# Patient Record
Sex: Male | Born: 1935 | Race: White | Hispanic: No | Marital: Married | Smoking: Never smoker
Health system: Southern US, Community
[De-identification: ages and names within clinical notes are randomized; demographics above are authoritative.]

## PROBLEM LIST (undated history)

## (undated) DIAGNOSIS — I639 Cerebral infarction, unspecified: Secondary | ICD-10-CM

## (undated) DIAGNOSIS — I2510 Atherosclerotic heart disease of native coronary artery without angina pectoris: Secondary | ICD-10-CM

## (undated) DIAGNOSIS — S4990XA Unspecified injury of shoulder and upper arm, unspecified arm, initial encounter: Secondary | ICD-10-CM

## (undated) DIAGNOSIS — E785 Hyperlipidemia, unspecified: Secondary | ICD-10-CM

## (undated) HISTORY — PX: ANGIOPLASTY: SHX39

## (undated) HISTORY — DX: Cerebral infarction, unspecified: I63.9

## (undated) HISTORY — DX: Atherosclerotic heart disease of native coronary artery without angina pectoris: I25.10

## (undated) HISTORY — PX: CARDIAC CATHETERIZATION: SHX172

## (undated) HISTORY — DX: Hyperlipidemia, unspecified: E78.5

## (undated) HISTORY — DX: Unspecified injury of shoulder and upper arm, unspecified arm, initial encounter: S49.90XA

---

## 1998-07-29 ENCOUNTER — Ambulatory Visit (HOSPITAL_BASED_OUTPATIENT_CLINIC_OR_DEPARTMENT_OTHER): Admission: RE | Admit: 1998-07-29 | Discharge: 1998-07-29 | Payer: Self-pay | Admitting: *Deleted

## 2001-11-21 ENCOUNTER — Encounter: Admission: RE | Admit: 2001-11-21 | Discharge: 2001-11-21 | Payer: Self-pay | Admitting: Family Medicine

## 2001-11-21 ENCOUNTER — Encounter: Payer: Self-pay | Admitting: Family Medicine

## 2007-09-02 IMAGING — CR DG CHEST 2V
2 series · 2 of 2 positions shown · non-contrast
Comparison: None.

CLINICAL DATA: Preop evaluation for eye surgery.

[view not recorded (1 of 2)]
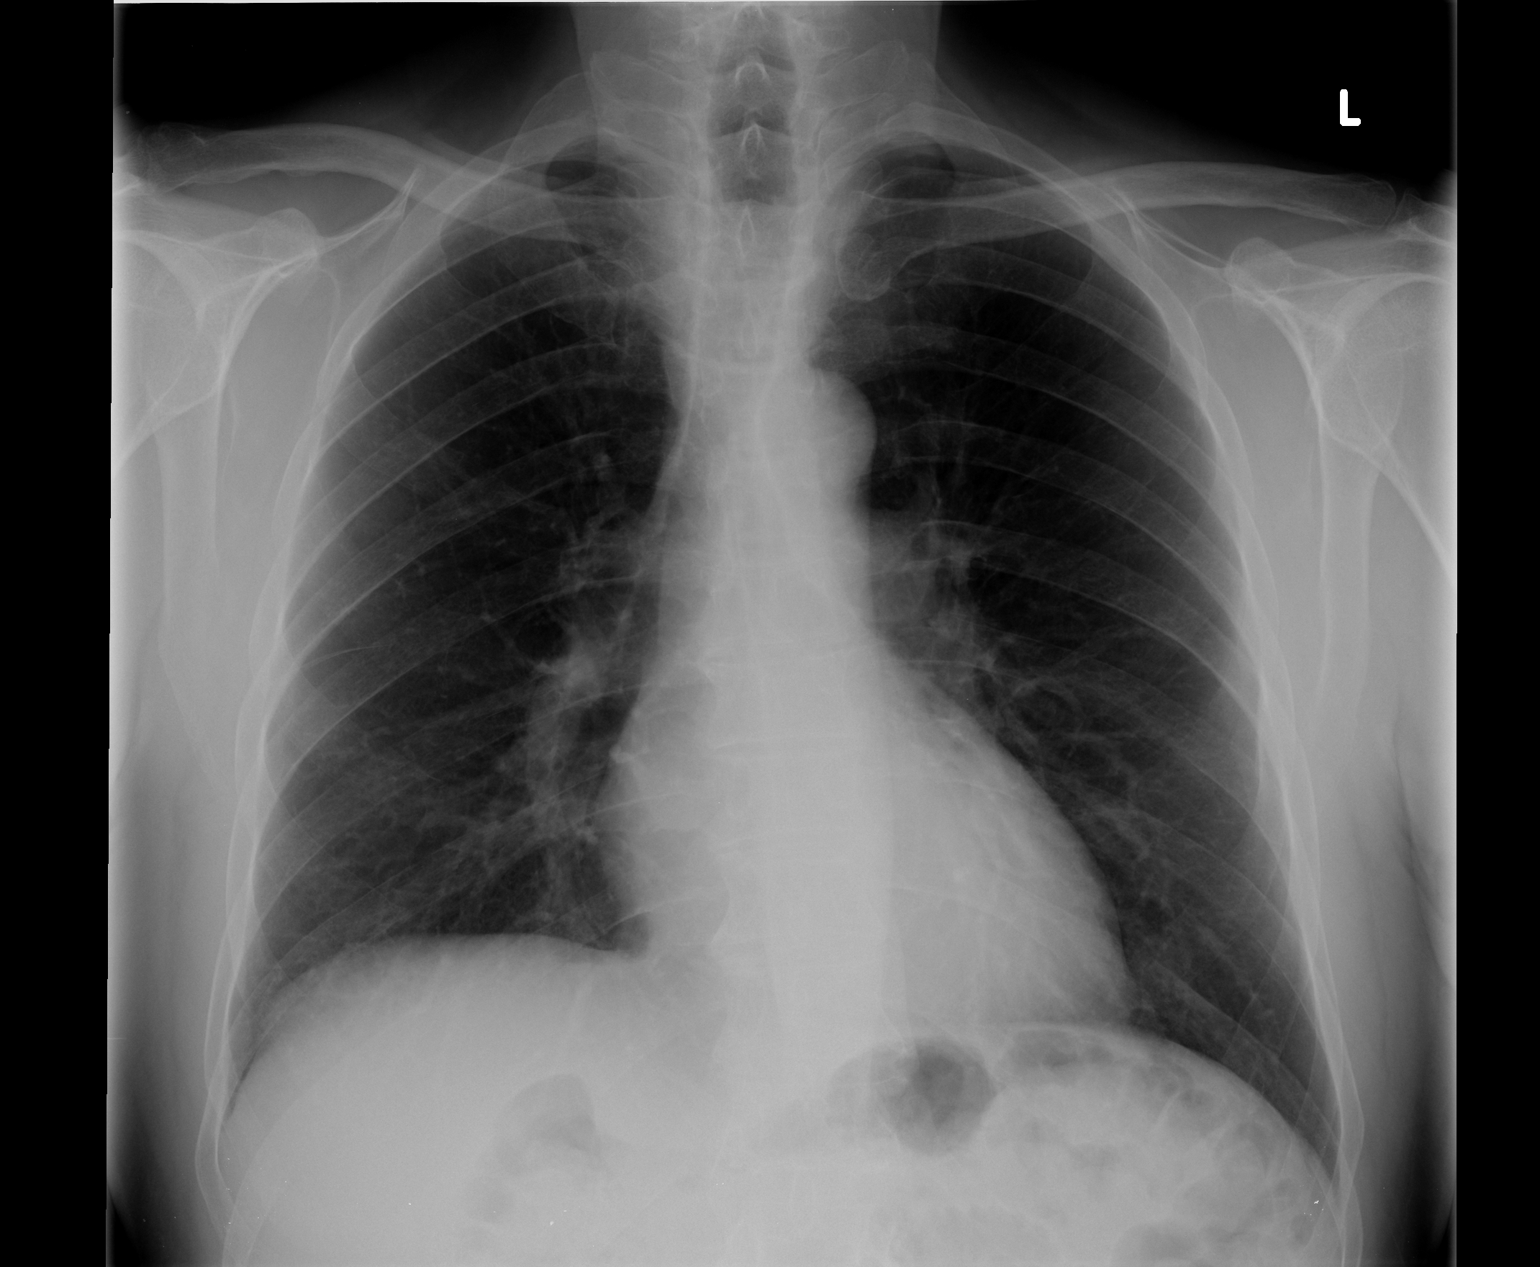

[view not recorded (2 of 2)]
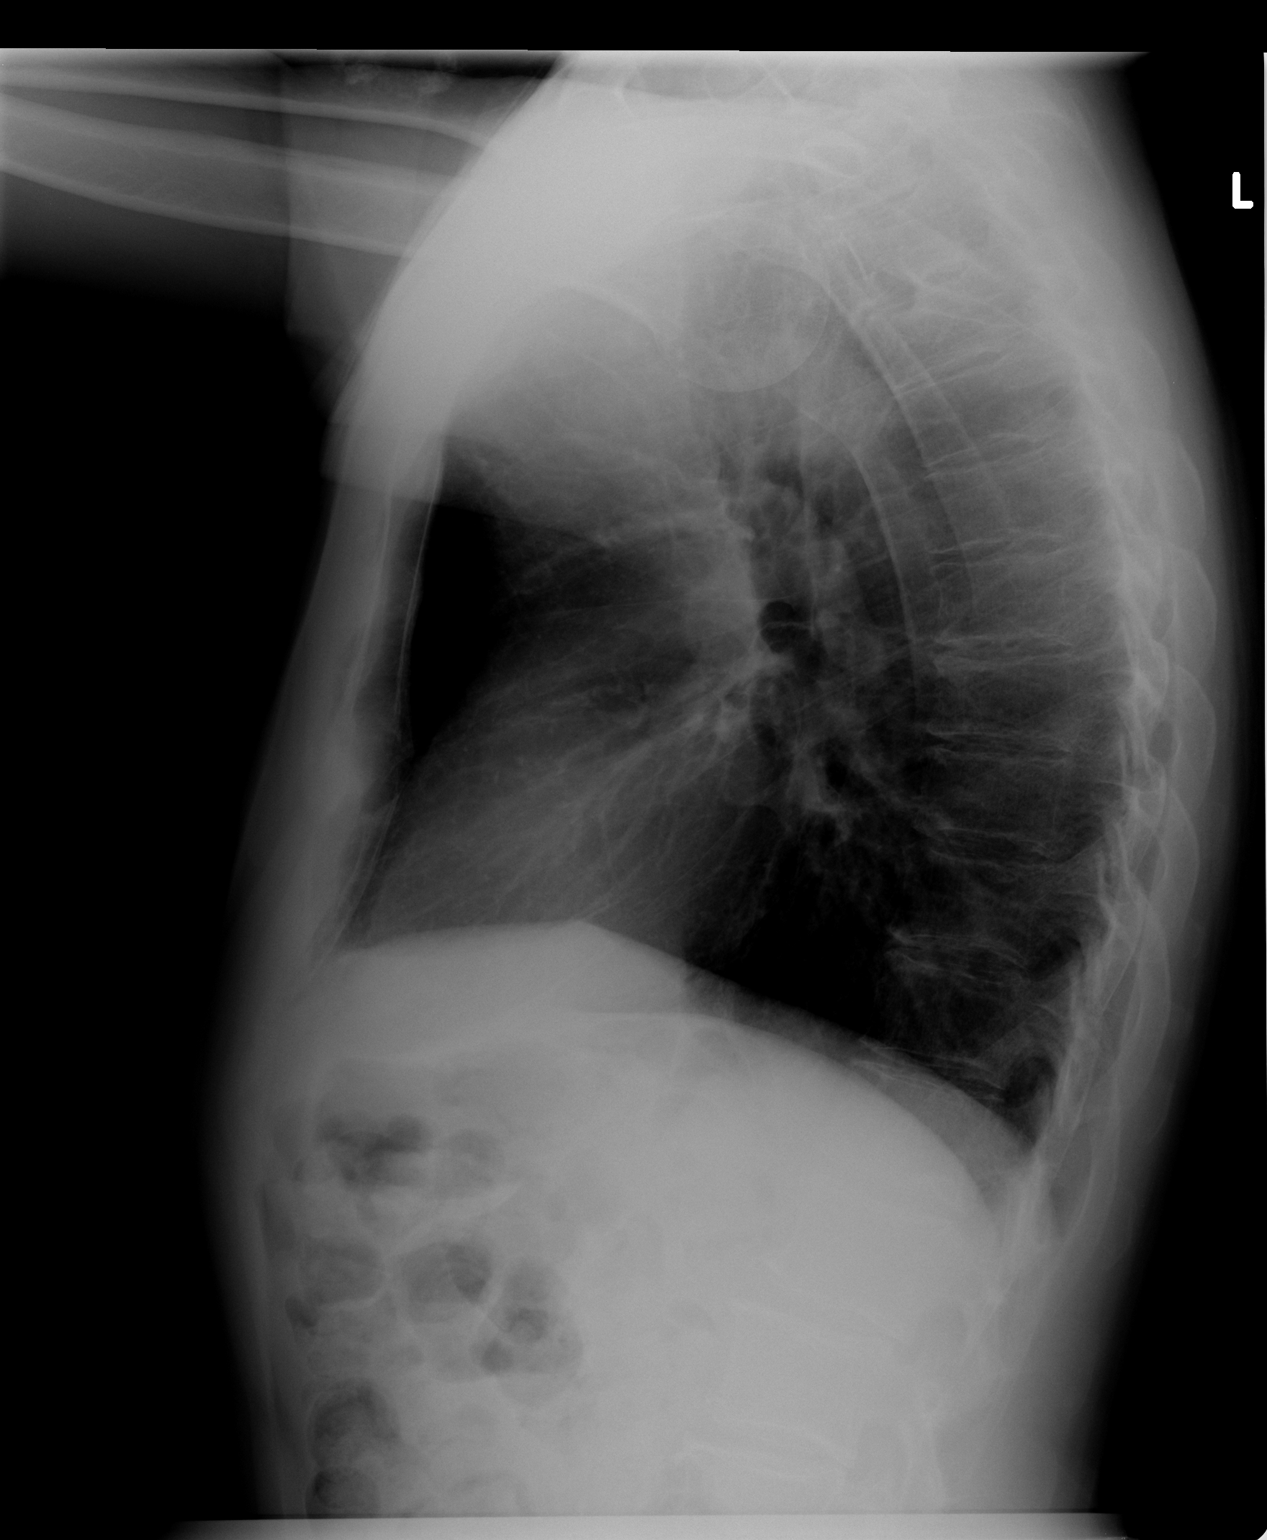

[2 of 2 positions shown; findings below may reference images not displayed]

CHEST - 2 VIEW:

Lungs are clear. Cardiopericardial silhouette is within normal limits for size.
Mild pectus excavatum deformity is evident. Bony structures are otherwise
unremarkable.
IMPRESSION: No acute cardiopulmonary process

## 2007-09-03 ENCOUNTER — Ambulatory Visit (HOSPITAL_COMMUNITY): Admission: RE | Admit: 2007-09-03 | Discharge: 2007-09-03 | Payer: Self-pay | Admitting: Ophthalmology

## 2010-04-26 ENCOUNTER — Ambulatory Visit: Payer: Self-pay | Admitting: Cardiology

## 2011-01-02 NOTE — Op Note (Signed)
NAME:  Clarence Myers, Clarence Myers             ACCOUNT NO.:  [id number]   MEDICAL RECORD NO.:  [id number]          PATIENT TYPE:  AMB   LOCATION:  SDS                          FACILITY:  [facility]   PHYSICIAN:  Lanna Poche, M.D. DATE OF BIRTH:  11-04-1933   DATE OF PROCEDURE:  09/03/2007  DATE OF DISCHARGE:                               OPERATIVE REPORT   PREOPERATIVE DIAGNOSIS:  Macular pucker left eye.   POSTOPERATIVE DIAGNOSIS:  Macular pucker left eye.   PROCEDURE:  Pars plana vitrectomy, membrane peeling, and air/fluid  exchange left eye.   SURGEON:  Lanna Poche, M.D.   ASSISTANT:  Ulysees Barns, LPN   ANESTHESIA:  General endotracheal.   ESTIMATED BLOOD LOSS:  Less than 1 mL.   COMPLICATIONS:  None.   DESCRIPTION OF PROCEDURE:  The patient was taken to the operating room  where after induction of general anesthesia, the left eye was prepped  and draped in the usual sterile fashion.  The lid speculum was  introduced and conjunctival peritomies were created temporally and  superonasally.  Hemostasis was obtained with the eraser cautery and  sclerotomies were fashioned 3.5 mm posterior to the limbus at 1:30,  10:30, and 4:30.  The superior sclerotomies were plugged and a 4 mm  infusion cannula was secured to the 4:30 sclerotomy with a temporary  suture of 7-0 Vicryl.  This was visually inspected and found to be in  good position.  The Landers ring was secured with 7-0 Vicryl sutures at  3 and 9.  Plugs were removed and a 30 degree prismatic lens was applied  to the surface of the eye.  A central core followed by peripheral  vitrectomy was performed.  There was posterior hyaloid separation.  A  pneumatic flat lens was applied to the surface of the eye.  The silicon  tipped catheter was used to check for the presence of cortical vitreous  over the macula and none was found to be present.  The MVR blade was  used to make an incision to the glistening epiretinal membrane.  The  edge of the membrane was then elevated with the rice pick, peeled off  the macular area.  There were two focal areas of adhesions encountered,  one superior and slightly temporal.  There was a small punctate retinal  hemorrhage where the membrane had been adherent.  When sweeping off  inferotemporally and no area of adherence was encountered.  When  sweeping the membrane, a small defect of the retina was seen.  The  membrane was then peeled around this area and the rest of the membrane  removed en block.  The scissors were used to trim the membrane around a  little focal area of adhesion that had been adherent.  No additional  membrane was seen and no glistening to the retinal surface was  appreciated.  No internal limiting membrane peel was undertaken.  The  instruments were removed from the eye and the holes were plugged.  The  Landers lens ring removed.  Inspection with the indirect ophthalmoscope  revealed there to be no retinal breaks or  tears.  The silicon tip  catheter was then used for air/fluid exchange.  The superior  sclerotomies were then closed with 7-0 Vicryl.  The infusion cannula was  removed and the preplaced sutures secured.  The conjunctiva was drawn up  and reapproximated with interrupted and running sutures of 6-0 plain  gut.  The pressure was checked with the Barraquer tonometer and found to  be approximately 18.  The subconjunctival space was irrigated with 0.75%  Marcaine followed by subconjunctival injections of 100 mg of  ceftazidime, 10 mg of Decadron.  The lid speculum  was then removed and mixed antibiotic ointment applied to the surface of  the eye.  Eye patch and shield was then placed over the patient's left  eye.  Upon awaking from anesthesia, the patient left the operating room  in stable condition.           ______________________________  Lanna Poche, M.D.     JTH/MEDQ  D:  09/03/2007  T:  09/03/2007  Job:  [id number]

## 2011-03-22 ENCOUNTER — Telehealth: Payer: Self-pay | Admitting: Cardiology

## 2011-03-22 NOTE — Telephone Encounter (Signed)
Pt called to schedule yealy OV, notes states pt need ETT with labs, please call pt to schedule

## 2011-04-05 ENCOUNTER — Telehealth: Payer: Self-pay | Admitting: *Deleted

## 2011-04-05 ENCOUNTER — Other Ambulatory Visit: Payer: Self-pay | Admitting: *Deleted

## 2011-04-05 DIAGNOSIS — I2510 Atherosclerotic heart disease of native coronary artery without angina pectoris: Secondary | ICD-10-CM

## 2011-04-05 NOTE — Telephone Encounter (Signed)
Have left several messages for pt to return call. Will need to schedule him at [facility] office  When calls back will need to decide if needs ETT or do stress test. Also will need fasting labs

## 2011-05-01 ENCOUNTER — Encounter: Payer: Self-pay | Admitting: Cardiology

## 2011-05-03 ENCOUNTER — Other Ambulatory Visit: Payer: Self-pay | Admitting: *Deleted

## 2011-05-10 LAB — CBC
HCT: 41.5
Hemoglobin: 14.1
MCHC: 34
MCV: 92.2
RBC: 4.5
RDW: 12.2

## 2011-05-10 LAB — BASIC METABOLIC PANEL
CO2: 26
Chloride: 105
GFR calc Af Amer: >60
Glucose, Bld: 82
Potassium: 3.8
Sodium: 132 — ABNORMAL LOW

## 2011-05-10 LAB — URINALYSIS, ROUTINE W REFLEX MICROSCOPIC
Bilirubin Urine: NEGATIVE
Glucose, UA: NEGATIVE
Hgb urine dipstick: NEGATIVE
Protein, ur: NEGATIVE
Urobilinogen, UA: 0.2

## 2011-05-11 ENCOUNTER — Other Ambulatory Visit (INDEPENDENT_AMBULATORY_CARE_PROVIDER_SITE_OTHER): Payer: Medicare Other | Admitting: *Deleted

## 2011-05-11 ENCOUNTER — Encounter: Payer: Self-pay | Admitting: Cardiology

## 2011-05-11 ENCOUNTER — Ambulatory Visit (INDEPENDENT_AMBULATORY_CARE_PROVIDER_SITE_OTHER): Payer: Medicare Other | Admitting: Cardiology

## 2011-05-11 VITALS — Ht 66.0 in | Wt 134.0 lb

## 2011-05-11 DIAGNOSIS — I2510 Atherosclerotic heart disease of native coronary artery without angina pectoris: Secondary | ICD-10-CM | POA: Insufficient documentation

## 2011-05-11 DIAGNOSIS — E785 Hyperlipidemia, unspecified: Secondary | ICD-10-CM | POA: Insufficient documentation

## 2011-05-11 LAB — LIPID PANEL
Cholesterol: 152 mg/dL (ref 0–200)
HDL: 57 mg/dL (ref >39.00)
LDL Cholesterol: 85 mg/dL (ref 0–99)
Total CHOL/HDL Ratio: 3
Triglycerides: 48 mg/dL (ref 0.0–149.0)
VLDL: 9.6 mg/dL (ref 0.0–40.0)

## 2011-05-11 LAB — HEPATIC FUNCTION PANEL
ALT: 19 U/L (ref 0–53)
AST: 31 U/L (ref 0–37)
Albumin: 4.3 g/dL (ref 3.5–5.2)
Alkaline Phosphatase: 73 U/L (ref 39–117)
Bilirubin, Direct: 0.2 mg/dL (ref 0.0–0.3)
Total Bilirubin: 0.8 mg/dL (ref 0.3–1.2)
Total Protein: 7.5 g/dL (ref 6.0–8.3)

## 2011-05-11 LAB — BASIC METABOLIC PANEL
BUN: 21 mg/dL (ref 6–23)
CO2: 26 mEq/L (ref 19–32)
Calcium: 9.2 mg/dL (ref 8.4–10.5)
Chloride: 103 mEq/L (ref 96–112)
Creatinine, Ser: 1 mg/dL (ref 0.4–1.5)
GFR: 75.59 mL/min (ref >60.00)
Glucose, Bld: 103 mg/dL — ABNORMAL HIGH (ref 70–99)
Potassium: 5.1 mEq/L (ref 3.5–5.1)
Sodium: 142 mEq/L (ref 135–145)

## 2011-05-11 NOTE — Assessment & Plan Note (Signed)
Stress test stable from 1 year prior. Follow up in 1 year.

## 2011-05-11 NOTE — Progress Notes (Signed)
Patient seen for yearly stress test. See test report for details.

## 2011-05-12 ENCOUNTER — Encounter: Payer: Self-pay | Admitting: Cardiology

## 2011-05-14 ENCOUNTER — Telehealth: Payer: Self-pay | Admitting: *Deleted

## 2011-05-14 NOTE — Telephone Encounter (Signed)
Lm w/lab results. To call back if has any questions. Will send to Dr. Pincus Badder.

## 2011-05-14 NOTE — Telephone Encounter (Signed)
Message copied by Lorayne Bender on Mon May 14, 2011  4:58 PM ------      Message from: [place], PETER M      Created: Fri May 11, 2011  5:15 PM       Lipids and chemistries are good. Glucose is 103.      Theron Arista [place]

## 2011-09-04 DIAGNOSIS — D485 Neoplasm of uncertain behavior of skin: Secondary | ICD-10-CM | POA: Diagnosis not present

## 2011-10-09 ENCOUNTER — Encounter: Payer: Self-pay | Admitting: Cardiology

## 2011-10-09 DIAGNOSIS — Z0000 Encounter for general adult medical examination without abnormal findings: Secondary | ICD-10-CM | POA: Diagnosis not present

## 2011-10-09 DIAGNOSIS — Z1211 Encounter for screening for malignant neoplasm of colon: Secondary | ICD-10-CM | POA: Diagnosis not present

## 2011-10-09 DIAGNOSIS — I10 Essential (primary) hypertension: Secondary | ICD-10-CM | POA: Diagnosis not present

## 2011-10-09 DIAGNOSIS — Z125 Encounter for screening for malignant neoplasm of prostate: Secondary | ICD-10-CM | POA: Diagnosis not present

## 2011-10-09 DIAGNOSIS — N529 Male erectile dysfunction, unspecified: Secondary | ICD-10-CM | POA: Diagnosis not present

## 2011-10-09 DIAGNOSIS — I2510 Atherosclerotic heart disease of native coronary artery without angina pectoris: Secondary | ICD-10-CM | POA: Diagnosis not present

## 2011-10-09 DIAGNOSIS — R7301 Impaired fasting glucose: Secondary | ICD-10-CM | POA: Diagnosis not present

## 2011-10-09 DIAGNOSIS — E782 Mixed hyperlipidemia: Secondary | ICD-10-CM | POA: Diagnosis not present

## 2011-10-30 DIAGNOSIS — D126 Benign neoplasm of colon, unspecified: Secondary | ICD-10-CM | POA: Diagnosis not present

## 2011-10-30 DIAGNOSIS — Z1211 Encounter for screening for malignant neoplasm of colon: Secondary | ICD-10-CM | POA: Diagnosis not present

## 2012-01-30 DIAGNOSIS — H4011X0 Primary open-angle glaucoma, stage unspecified: Secondary | ICD-10-CM | POA: Diagnosis not present

## 2012-06-19 DIAGNOSIS — H52229 Regular astigmatism, unspecified eye: Secondary | ICD-10-CM | POA: Diagnosis not present

## 2012-06-19 DIAGNOSIS — H4011X0 Primary open-angle glaucoma, stage unspecified: Secondary | ICD-10-CM | POA: Diagnosis not present

## 2012-06-19 DIAGNOSIS — H5210 Myopia, unspecified eye: Secondary | ICD-10-CM | POA: Diagnosis not present

## 2012-10-23 ENCOUNTER — Telehealth: Payer: Self-pay | Admitting: Cardiology

## 2012-10-23 DIAGNOSIS — I2510 Atherosclerotic heart disease of native coronary artery without angina pectoris: Secondary | ICD-10-CM

## 2012-10-23 NOTE — Telephone Encounter (Signed)
New problem   Pt was wondering is he suppose to have an exercise test and if so he need someone to call him to schedule.

## 2012-10-28 NOTE — Telephone Encounter (Signed)
Patient called phone rings busy. 

## 2012-10-29 NOTE — Telephone Encounter (Signed)
Spoke to patient he stated his phone has been out of order.States every year he has a treadmill test.Patient was told Dr.Jordan out of office this week, will check with him and call him back.

## 2012-10-30 DIAGNOSIS — K219 Gastro-esophageal reflux disease without esophagitis: Secondary | ICD-10-CM | POA: Diagnosis not present

## 2012-10-30 DIAGNOSIS — Z125 Encounter for screening for malignant neoplasm of prostate: Secondary | ICD-10-CM | POA: Diagnosis not present

## 2012-10-30 DIAGNOSIS — I10 Essential (primary) hypertension: Secondary | ICD-10-CM | POA: Diagnosis not present

## 2012-10-30 DIAGNOSIS — I2510 Atherosclerotic heart disease of native coronary artery without angina pectoris: Secondary | ICD-10-CM | POA: Diagnosis not present

## 2012-10-30 DIAGNOSIS — E782 Mixed hyperlipidemia: Secondary | ICD-10-CM | POA: Diagnosis not present

## 2012-10-30 DIAGNOSIS — Z0000 Encounter for general adult medical examination without abnormal findings: Secondary | ICD-10-CM | POA: Diagnosis not present

## 2012-10-30 DIAGNOSIS — Z1211 Encounter for screening for malignant neoplasm of colon: Secondary | ICD-10-CM | POA: Diagnosis not present

## 2012-10-30 DIAGNOSIS — R7301 Impaired fasting glucose: Secondary | ICD-10-CM | POA: Diagnosis not present

## 2012-10-30 DIAGNOSIS — Z1331 Encounter for screening for depression: Secondary | ICD-10-CM | POA: Diagnosis not present

## 2012-10-30 DIAGNOSIS — N529 Male erectile dysfunction, unspecified: Secondary | ICD-10-CM | POA: Diagnosis not present

## 2012-11-07 NOTE — Telephone Encounter (Signed)
Spoke with patient he stated he has appointment scheduled 11/21/12 for treadmill with Tereso Newcomer PA.He stated he would like to schedule with Dr.Jordan so he don't have to make office visit with Dr.Jordan.Will check with Dr.Jordan and call him back.

## 2012-11-12 NOTE — Telephone Encounter (Signed)
Patient called no answer.LMTC. 

## 2012-11-13 DIAGNOSIS — H4011X0 Primary open-angle glaucoma, stage unspecified: Secondary | ICD-10-CM | POA: Diagnosis not present

## 2012-11-13 DIAGNOSIS — H409 Unspecified glaucoma: Secondary | ICD-10-CM | POA: Diagnosis not present

## 2012-11-13 NOTE — Telephone Encounter (Signed)
Patient called no answer.Left message on personal voice mail schedulers will be calling to schedule treadmill with Dr.Jordan.Treadmill with Tereso Newcomer PA will be cancelled.

## 2012-11-18 ENCOUNTER — Encounter: Payer: Self-pay | Admitting: Cardiology

## 2012-11-21 ENCOUNTER — Encounter: Payer: Medicare Other | Admitting: Physician Assistant

## 2013-03-11 DIAGNOSIS — M67919 Unspecified disorder of synovium and tendon, unspecified shoulder: Secondary | ICD-10-CM | POA: Diagnosis not present

## 2013-04-06 DIAGNOSIS — L570 Actinic keratosis: Secondary | ICD-10-CM | POA: Diagnosis not present

## 2013-04-28 DIAGNOSIS — H4011X0 Primary open-angle glaucoma, stage unspecified: Secondary | ICD-10-CM | POA: Diagnosis not present

## 2013-04-28 DIAGNOSIS — H409 Unspecified glaucoma: Secondary | ICD-10-CM | POA: Diagnosis not present

## 2013-05-28 ENCOUNTER — Encounter: Payer: Self-pay | Admitting: Cardiology

## 2013-05-28 ENCOUNTER — Ambulatory Visit (INDEPENDENT_AMBULATORY_CARE_PROVIDER_SITE_OTHER): Payer: Medicare Other | Admitting: Cardiology

## 2013-05-28 DIAGNOSIS — I2510 Atherosclerotic heart disease of native coronary artery without angina pectoris: Secondary | ICD-10-CM

## 2013-05-28 DIAGNOSIS — I25810 Atherosclerosis of coronary artery bypass graft(s) without angina pectoris: Secondary | ICD-10-CM | POA: Diagnosis not present

## 2013-05-28 DIAGNOSIS — E785 Hyperlipidemia, unspecified: Secondary | ICD-10-CM | POA: Diagnosis not present

## 2013-05-28 LAB — BASIC METABOLIC PANEL
CO2: 26 mEq/L (ref 19–32)
Calcium: 9.4 mg/dL (ref 8.4–10.5)
Chloride: 103 mEq/L (ref 96–112)
Creatinine, Ser: 1.1 mg/dL (ref 0.4–1.5)
Glucose, Bld: 109 mg/dL — ABNORMAL HIGH (ref 70–99)

## 2013-05-28 LAB — HEPATIC FUNCTION PANEL
ALT: 17 U/L (ref 0–53)
AST: 22 U/L (ref 0–37)
Albumin: 4.2 g/dL (ref 3.5–5.2)
Bilirubin, Direct: 0.1 mg/dL (ref 0.0–0.3)
Total Bilirubin: 1 mg/dL (ref 0.3–1.2)
Total Protein: 7.7 g/dL (ref 6.0–8.3)

## 2013-05-28 LAB — LIPID PANEL
LDL Cholesterol: 80 mg/dL (ref 0–99)
Total CHOL/HDL Ratio: 3
Triglycerides: 79 mg/dL (ref 0.0–149.0)
VLDL: 15.8 mg/dL (ref 0.0–40.0)

## 2013-05-28 NOTE — Patient Instructions (Signed)
Your physician wants you to follow-up in: 1 year for a treadmill and fasting lab. You will receive a reminder letter in the mail two months in advance. If you don't receive a letter, please call our office to schedule the follow-up appointment.

## 2013-05-28 NOTE — Progress Notes (Signed)
Exercise Treadmill Test  Pre-Exercise Testing Evaluation Rhythm: normal sinus  Rate: 75 bpm    Indication: Coronary disease. Status post angioplasty of the proximal LAD in 1992. History of hyperlipidemia. Test  Exercise Tolerance Test Ordering MD: Peter [place], MD  Interpreting MD: Peter [place], MD  Unique Test No: 1  Treadmill:  1  Indication for ETT: known ASHD Contraindication to ETT: No   Stress Modality: exercise - treadmill  Cardiac Imaging Performed: non   Protocol: standard Bruce - maximal  Max BP:  209/88  Max MPHR (bpm):  145 85% MPR (bpm):  123  MPHR obtained (bpm):  150 % MPHR obtained:  104%  Reached 85% MPHR (min:sec):  7:02 Total Exercise Time (min-sec):  11:00  Workload in METS:  13.4 Borg Scale: 16  Reason ETT Terminated:  fatigue    ST Segment Analysis At Rest: non-specific ST segment slurring With Exercise: non-specific ST changes  Other Information Arrhythmia:  No Angina during ETT:  absent (0) Quality of ETT:  diagnostic  ETT Interpretation:  normal - no evidence of ischemia by ST analysis  Comments: Normal exercise stress test at excellent exercise level. Patient has mild J-point depression in the inferior leads and in leads V4 and V5 at peak exercise that resolves very quickly during recovery. Rare PVC in PVC couplet. Compared to prior stress test 2 years ago exercise tolerance has decreased by 1 minute. Otherwise no significant change.  Recommendations: Continue medical management. We will check fasting lab work today. Followup stress test in one year.

## 2013-06-02 ENCOUNTER — Encounter: Payer: Self-pay | Admitting: Cardiology

## 2013-08-19 DIAGNOSIS — H409 Unspecified glaucoma: Secondary | ICD-10-CM | POA: Diagnosis not present

## 2013-08-19 DIAGNOSIS — H4011X0 Primary open-angle glaucoma, stage unspecified: Secondary | ICD-10-CM | POA: Diagnosis not present

## 2013-11-06 ENCOUNTER — Encounter: Payer: Self-pay | Admitting: Cardiology

## 2013-11-06 DIAGNOSIS — Z125 Encounter for screening for malignant neoplasm of prostate: Secondary | ICD-10-CM | POA: Diagnosis not present

## 2013-11-06 DIAGNOSIS — K219 Gastro-esophageal reflux disease without esophagitis: Secondary | ICD-10-CM | POA: Diagnosis not present

## 2013-11-06 DIAGNOSIS — E782 Mixed hyperlipidemia: Secondary | ICD-10-CM | POA: Diagnosis not present

## 2013-11-06 DIAGNOSIS — R7301 Impaired fasting glucose: Secondary | ICD-10-CM | POA: Diagnosis not present

## 2013-11-06 DIAGNOSIS — Z0000 Encounter for general adult medical examination without abnormal findings: Secondary | ICD-10-CM | POA: Diagnosis not present

## 2013-11-06 DIAGNOSIS — I2510 Atherosclerotic heart disease of native coronary artery without angina pectoris: Secondary | ICD-10-CM | POA: Diagnosis not present

## 2013-11-06 DIAGNOSIS — N529 Male erectile dysfunction, unspecified: Secondary | ICD-10-CM | POA: Diagnosis not present

## 2013-11-06 DIAGNOSIS — Z1331 Encounter for screening for depression: Secondary | ICD-10-CM | POA: Diagnosis not present

## 2013-11-06 DIAGNOSIS — I119 Hypertensive heart disease without heart failure: Secondary | ICD-10-CM | POA: Diagnosis not present

## 2013-11-06 DIAGNOSIS — Z1211 Encounter for screening for malignant neoplasm of colon: Secondary | ICD-10-CM | POA: Diagnosis not present

## 2013-11-25 DIAGNOSIS — H409 Unspecified glaucoma: Secondary | ICD-10-CM | POA: Diagnosis not present

## 2013-11-25 DIAGNOSIS — H4011X0 Primary open-angle glaucoma, stage unspecified: Secondary | ICD-10-CM | POA: Diagnosis not present

## 2014-04-07 DIAGNOSIS — H4010X0 Unspecified open-angle glaucoma, stage unspecified: Secondary | ICD-10-CM | POA: Diagnosis not present

## 2014-04-07 DIAGNOSIS — H4011X0 Primary open-angle glaucoma, stage unspecified: Secondary | ICD-10-CM | POA: Diagnosis not present

## 2014-04-07 DIAGNOSIS — H409 Unspecified glaucoma: Secondary | ICD-10-CM | POA: Diagnosis not present

## 2014-05-13 DIAGNOSIS — R7301 Impaired fasting glucose: Secondary | ICD-10-CM | POA: Diagnosis not present

## 2014-05-20 ENCOUNTER — Telehealth (HOSPITAL_COMMUNITY): Payer: Self-pay | Admitting: *Deleted

## 2014-05-20 DIAGNOSIS — E785 Hyperlipidemia, unspecified: Secondary | ICD-10-CM

## 2014-05-20 DIAGNOSIS — I25810 Atherosclerosis of coronary artery bypass graft(s) without angina pectoris: Secondary | ICD-10-CM

## 2014-05-20 DIAGNOSIS — I25710 Atherosclerosis of autologous vein coronary artery bypass graft(s) with unstable angina pectoris: Secondary | ICD-10-CM

## 2014-05-20 NOTE — Telephone Encounter (Signed)
Returned call to patient he stated he normally has a GXT with Dr.Jordan and not office visit.Spoke to [place] he advised ok to schedule patient GXT appointment with him.Advised will mail lab orders and may have lab done on same day of GXT.Advised scheduler will call back with GXT appointment.Advised I will mail lab orders.

## 2014-05-20 NOTE — Telephone Encounter (Signed)
Pt states that he is supposed to have a stress test and labs prior to his next appt with Dr. Martinique. Is this correct and if so can an order be placed so that it can be scheduled.

## 2014-05-24 NOTE — Addendum Note (Signed)
Addended by: Golden Hurter D on: 05/24/2014 03:47 PM   Modules accepted: Orders

## 2014-06-04 ENCOUNTER — Telehealth (HOSPITAL_COMMUNITY): Payer: Self-pay

## 2014-06-04 NOTE — Telephone Encounter (Signed)
Encounter complete. 

## 2014-06-09 ENCOUNTER — Ambulatory Visit (HOSPITAL_COMMUNITY)
Admission: RE | Admit: 2014-06-09 | Discharge: 2014-06-09 | Disposition: A | Discharge status: Home / Self Care | Payer: Medicare Other | Source: Ambulatory Visit | Attending: Cardiovascular Disease | Admitting: Cardiovascular Disease

## 2014-06-09 ENCOUNTER — Telehealth: Payer: Self-pay | Admitting: Cardiology

## 2014-06-09 ENCOUNTER — Ambulatory Visit: Payer: Medicare Other | Admitting: Cardiology

## 2014-06-09 ENCOUNTER — Other Ambulatory Visit: Payer: Self-pay

## 2014-06-09 DIAGNOSIS — E785 Hyperlipidemia, unspecified: Secondary | ICD-10-CM

## 2014-06-09 DIAGNOSIS — R079 Chest pain, unspecified: Secondary | ICD-10-CM | POA: Diagnosis not present

## 2014-06-09 DIAGNOSIS — I25710 Atherosclerosis of autologous vein coronary artery bypass graft(s) with unstable angina pectoris: Secondary | ICD-10-CM

## 2014-06-09 LAB — HEPATIC FUNCTION PANEL
ALK PHOS: 67 U/L (ref 39–117)
ALT: 12 U/L (ref 0–53)
AST: 19 U/L (ref 0–37)
Albumin: 4.3 g/dL (ref 3.5–5.2)
BILIRUBIN INDIRECT: 0.4 mg/dL (ref 0.2–1.2)
Bilirubin, Direct: 0.1 mg/dL (ref 0.0–0.3)
Total Bilirubin: 0.5 mg/dL (ref 0.2–1.2)
Total Protein: 7.4 g/dL (ref 6.0–8.3)

## 2014-06-09 LAB — BASIC METABOLIC PANEL
BUN: 26 mg/dL — AB (ref 6–23)
CO2: 28 mEq/L (ref 19–32)
Calcium: 9.4 mg/dL (ref 8.4–10.5)
Chloride: 104 mEq/L (ref 96–112)
Creat: 0.94 mg/dL (ref 0.50–1.35)
Glucose, Bld: 101 mg/dL — ABNORMAL HIGH (ref 70–99)
Potassium: 4.3 mEq/L (ref 3.5–5.3)
SODIUM: 141 meq/L (ref 135–145)

## 2014-06-09 LAB — LIPID PANEL
CHOLESTEROL: 161 mg/dL (ref 0–200)
HDL: 61 mg/dL (ref >39)
LDL Cholesterol: 88 mg/dL (ref 0–99)
TRIGLYCERIDES: 61 mg/dL (ref <150)
Total CHOL/HDL Ratio: 2.6 Ratio
VLDL: 12 mg/dL (ref 0–40)

## 2014-06-09 NOTE — Procedures (Signed)
Exercise Treadmill Test  Pre-Exercise Testing Evaluation NSR, normal ECG  Test  Exercise Tolerance Test Ordering MD: Peter Martinique, MD    Unique Test No: 1  Treadmill:  1  Indication for ETT: chest pain - rule out ischemia  Contraindication to ETT: No   Stress Modality: exercise - treadmill  Cardiac Imaging Performed: non   Protocol: standard Bruce - maximal  Max BP:  218/85  Max MPHR (bpm):  142 85% MPR (bpm):  120  MPHR obtained (bpm):  150 % MPHR obtained:  104  Reached 85% MPHR (min:sec):  5:30  Total Exercise Time (min-sec):  5  Workload in METS:  7.0 Borg Scale: 15  Reason ETT Terminated:  SOB and General Fatigue    ST Segment Analysis At Rest: normal ST segments - no evidence of significant ST depression With Exercise: significant ischemic ST depression -2-3 mm in the inferior leads, 1 mm in lead V5  Other Information Arrhythmia:  PVCs and a ventricular couplet during exercise Angina during ETT:  absent (0) Quality of ETT:  diagnostic  ETT Interpretation:  abnormal - evidence of ST depression consistent with ischemia  Comments: Excellent exercise tolerance. Hypertensive response to exercise.  Recommendations: Consider further evaluation for CAD since the previous study was reported as normal in 2014. However, exercise capacity is unchanged from 2014 and the ST changes may be a post-CABG "false positive" abnormality.  Sanda Klein, MD, [facility] [phone] office [phone] pager

## 2014-06-09 NOTE — Telephone Encounter (Signed)
Per the answering service:Please call Katrina.

## 2014-06-14 ENCOUNTER — Telehealth: Payer: Self-pay | Admitting: Cardiology

## 2014-06-14 NOTE — Telephone Encounter (Signed)
Returning your call. °

## 2014-06-14 NOTE — Telephone Encounter (Signed)
Returned call to patient spoke to wife lab results given.

## 2014-09-01 DIAGNOSIS — H4011X2 Primary open-angle glaucoma, moderate stage: Secondary | ICD-10-CM | POA: Diagnosis not present

## 2014-09-01 DIAGNOSIS — H4010X2 Unspecified open-angle glaucoma, moderate stage: Secondary | ICD-10-CM | POA: Diagnosis not present

## 2014-11-22 DIAGNOSIS — J301 Allergic rhinitis due to pollen: Secondary | ICD-10-CM | POA: Diagnosis not present

## 2014-11-22 DIAGNOSIS — K5730 Diverticulosis of large intestine without perforation or abscess without bleeding: Secondary | ICD-10-CM | POA: Diagnosis not present

## 2014-11-22 DIAGNOSIS — H409 Unspecified glaucoma: Secondary | ICD-10-CM | POA: Diagnosis not present

## 2014-11-22 DIAGNOSIS — Z0000 Encounter for general adult medical examination without abnormal findings: Secondary | ICD-10-CM | POA: Diagnosis not present

## 2014-11-22 DIAGNOSIS — I119 Hypertensive heart disease without heart failure: Secondary | ICD-10-CM | POA: Diagnosis not present

## 2014-11-22 DIAGNOSIS — Z23 Encounter for immunization: Secondary | ICD-10-CM | POA: Diagnosis not present

## 2014-11-22 DIAGNOSIS — K219 Gastro-esophageal reflux disease without esophagitis: Secondary | ICD-10-CM | POA: Diagnosis not present

## 2014-11-22 DIAGNOSIS — W57XXXA Bitten or stung by nonvenomous insect and other nonvenomous arthropods, initial encounter: Secondary | ICD-10-CM | POA: Diagnosis not present

## 2014-11-22 DIAGNOSIS — I25119 Atherosclerotic heart disease of native coronary artery with unspecified angina pectoris: Secondary | ICD-10-CM | POA: Diagnosis not present

## 2014-11-22 DIAGNOSIS — E782 Mixed hyperlipidemia: Secondary | ICD-10-CM | POA: Diagnosis not present

## 2014-11-22 DIAGNOSIS — N529 Male erectile dysfunction, unspecified: Secondary | ICD-10-CM | POA: Diagnosis not present

## 2014-11-22 DIAGNOSIS — R7301 Impaired fasting glucose: Secondary | ICD-10-CM | POA: Diagnosis not present

## 2014-12-24 DIAGNOSIS — H4011X2 Primary open-angle glaucoma, moderate stage: Secondary | ICD-10-CM | POA: Diagnosis not present

## 2015-03-29 DIAGNOSIS — H52221 Regular astigmatism, right eye: Secondary | ICD-10-CM | POA: Diagnosis not present

## 2015-03-29 DIAGNOSIS — H4011X2 Primary open-angle glaucoma, moderate stage: Secondary | ICD-10-CM | POA: Diagnosis not present

## 2015-03-29 DIAGNOSIS — H5212 Myopia, left eye: Secondary | ICD-10-CM | POA: Diagnosis not present

## 2015-03-29 DIAGNOSIS — H40012 Open angle with borderline findings, low risk, left eye: Secondary | ICD-10-CM | POA: Diagnosis not present

## 2015-03-29 DIAGNOSIS — H5201 Hypermetropia, right eye: Secondary | ICD-10-CM | POA: Diagnosis not present

## 2015-04-18 DIAGNOSIS — Z961 Presence of intraocular lens: Secondary | ICD-10-CM | POA: Diagnosis not present

## 2015-04-18 DIAGNOSIS — H02839 Dermatochalasis of unspecified eye, unspecified eyelid: Secondary | ICD-10-CM | POA: Diagnosis not present

## 2015-04-18 DIAGNOSIS — H18413 Arcus senilis, bilateral: Secondary | ICD-10-CM | POA: Diagnosis not present

## 2015-04-18 DIAGNOSIS — H4010X0 Unspecified open-angle glaucoma, stage unspecified: Secondary | ICD-10-CM | POA: Diagnosis not present

## 2015-05-11 DIAGNOSIS — M67911 Unspecified disorder of synovium and tendon, right shoulder: Secondary | ICD-10-CM | POA: Diagnosis not present

## 2015-09-09 DIAGNOSIS — H35373 Puckering of macula, bilateral: Secondary | ICD-10-CM | POA: Diagnosis not present

## 2015-09-09 DIAGNOSIS — Z961 Presence of intraocular lens: Secondary | ICD-10-CM | POA: Diagnosis not present

## 2015-09-09 DIAGNOSIS — H401130 Primary open-angle glaucoma, bilateral, stage unspecified: Secondary | ICD-10-CM | POA: Diagnosis not present

## 2015-09-20 DIAGNOSIS — M545 Low back pain: Secondary | ICD-10-CM | POA: Diagnosis not present

## 2015-09-23 DIAGNOSIS — H401120 Primary open-angle glaucoma, left eye, stage unspecified: Secondary | ICD-10-CM | POA: Diagnosis not present

## 2015-09-29 DIAGNOSIS — M545 Low back pain: Secondary | ICD-10-CM | POA: Diagnosis not present

## 2015-09-29 DIAGNOSIS — H401122 Primary open-angle glaucoma, left eye, moderate stage: Secondary | ICD-10-CM | POA: Diagnosis not present

## 2015-09-29 DIAGNOSIS — H401111 Primary open-angle glaucoma, right eye, mild stage: Secondary | ICD-10-CM | POA: Diagnosis not present

## 2015-10-04 DIAGNOSIS — M545 Low back pain: Secondary | ICD-10-CM | POA: Diagnosis not present

## 2015-10-06 DIAGNOSIS — M545 Low back pain: Secondary | ICD-10-CM | POA: Diagnosis not present

## 2015-10-11 DIAGNOSIS — M545 Low back pain: Secondary | ICD-10-CM | POA: Diagnosis not present

## 2015-10-13 DIAGNOSIS — M545 Low back pain: Secondary | ICD-10-CM | POA: Diagnosis not present

## 2015-10-18 DIAGNOSIS — M545 Low back pain: Secondary | ICD-10-CM | POA: Diagnosis not present

## 2015-11-09 DIAGNOSIS — R0789 Other chest pain: Secondary | ICD-10-CM | POA: Diagnosis not present

## 2015-11-09 DIAGNOSIS — W19XXXA Unspecified fall, initial encounter: Secondary | ICD-10-CM | POA: Diagnosis not present

## 2015-12-22 DIAGNOSIS — R7301 Impaired fasting glucose: Secondary | ICD-10-CM | POA: Diagnosis not present

## 2015-12-22 DIAGNOSIS — J301 Allergic rhinitis due to pollen: Secondary | ICD-10-CM | POA: Diagnosis not present

## 2015-12-22 DIAGNOSIS — I25119 Atherosclerotic heart disease of native coronary artery with unspecified angina pectoris: Secondary | ICD-10-CM | POA: Diagnosis not present

## 2015-12-22 DIAGNOSIS — I119 Hypertensive heart disease without heart failure: Secondary | ICD-10-CM | POA: Diagnosis not present

## 2015-12-22 DIAGNOSIS — E782 Mixed hyperlipidemia: Secondary | ICD-10-CM | POA: Diagnosis not present

## 2015-12-22 DIAGNOSIS — K5730 Diverticulosis of large intestine without perforation or abscess without bleeding: Secondary | ICD-10-CM | POA: Diagnosis not present

## 2015-12-22 DIAGNOSIS — Z0000 Encounter for general adult medical examination without abnormal findings: Secondary | ICD-10-CM | POA: Diagnosis not present

## 2015-12-22 DIAGNOSIS — K219 Gastro-esophageal reflux disease without esophagitis: Secondary | ICD-10-CM | POA: Diagnosis not present

## 2015-12-22 DIAGNOSIS — H409 Unspecified glaucoma: Secondary | ICD-10-CM | POA: Diagnosis not present

## 2015-12-22 DIAGNOSIS — N529 Male erectile dysfunction, unspecified: Secondary | ICD-10-CM | POA: Diagnosis not present

## 2016-01-26 DIAGNOSIS — M545 Low back pain: Secondary | ICD-10-CM | POA: Diagnosis not present

## 2016-03-27 DIAGNOSIS — H35372 Puckering of macula, left eye: Secondary | ICD-10-CM | POA: Diagnosis not present

## 2016-03-27 DIAGNOSIS — H5203 Hypermetropia, bilateral: Secondary | ICD-10-CM | POA: Diagnosis not present

## 2016-03-27 DIAGNOSIS — H52223 Regular astigmatism, bilateral: Secondary | ICD-10-CM | POA: Diagnosis not present

## 2016-03-27 DIAGNOSIS — H401121 Primary open-angle glaucoma, left eye, mild stage: Secondary | ICD-10-CM | POA: Diagnosis not present

## 2016-04-17 DIAGNOSIS — H1045 Other chronic allergic conjunctivitis: Secondary | ICD-10-CM | POA: Diagnosis not present

## 2016-04-30 DIAGNOSIS — H01015 Ulcerative blepharitis left lower eyelid: Secondary | ICD-10-CM | POA: Diagnosis not present

## 2016-05-02 DIAGNOSIS — H01014 Ulcerative blepharitis left upper eyelid: Secondary | ICD-10-CM | POA: Diagnosis not present

## 2016-05-02 DIAGNOSIS — H01015 Ulcerative blepharitis left lower eyelid: Secondary | ICD-10-CM | POA: Diagnosis not present

## 2016-05-04 DIAGNOSIS — H01014 Ulcerative blepharitis left upper eyelid: Secondary | ICD-10-CM | POA: Diagnosis not present

## 2016-05-04 DIAGNOSIS — H01015 Ulcerative blepharitis left lower eyelid: Secondary | ICD-10-CM | POA: Diagnosis not present

## 2016-05-24 DIAGNOSIS — H1045 Other chronic allergic conjunctivitis: Secondary | ICD-10-CM | POA: Diagnosis not present

## 2016-07-17 DIAGNOSIS — H01004 Unspecified blepharitis left upper eyelid: Secondary | ICD-10-CM | POA: Diagnosis not present

## 2016-07-17 DIAGNOSIS — H35372 Puckering of macula, left eye: Secondary | ICD-10-CM | POA: Diagnosis not present

## 2016-07-17 DIAGNOSIS — H01001 Unspecified blepharitis right upper eyelid: Secondary | ICD-10-CM | POA: Diagnosis not present

## 2016-07-17 DIAGNOSIS — H01002 Unspecified blepharitis right lower eyelid: Secondary | ICD-10-CM | POA: Diagnosis not present

## 2016-07-17 DIAGNOSIS — H01005 Unspecified blepharitis left lower eyelid: Secondary | ICD-10-CM | POA: Diagnosis not present

## 2016-09-14 DIAGNOSIS — H401121 Primary open-angle glaucoma, left eye, mild stage: Secondary | ICD-10-CM | POA: Diagnosis not present

## 2016-09-14 DIAGNOSIS — H5203 Hypermetropia, bilateral: Secondary | ICD-10-CM | POA: Diagnosis not present

## 2016-09-14 DIAGNOSIS — H401131 Primary open-angle glaucoma, bilateral, mild stage: Secondary | ICD-10-CM | POA: Diagnosis not present

## 2016-09-14 DIAGNOSIS — H52223 Regular astigmatism, bilateral: Secondary | ICD-10-CM | POA: Diagnosis not present

## 2016-09-14 DIAGNOSIS — H401111 Primary open-angle glaucoma, right eye, mild stage: Secondary | ICD-10-CM | POA: Diagnosis not present

## 2016-11-29 DIAGNOSIS — H401192 Primary open-angle glaucoma, unspecified eye, moderate stage: Secondary | ICD-10-CM | POA: Diagnosis not present

## 2016-12-13 DIAGNOSIS — Z961 Presence of intraocular lens: Secondary | ICD-10-CM | POA: Diagnosis not present

## 2016-12-13 DIAGNOSIS — H401131 Primary open-angle glaucoma, bilateral, mild stage: Secondary | ICD-10-CM | POA: Diagnosis not present

## 2016-12-13 DIAGNOSIS — H35373 Puckering of macula, bilateral: Secondary | ICD-10-CM | POA: Diagnosis not present

## 2016-12-13 DIAGNOSIS — I10 Essential (primary) hypertension: Secondary | ICD-10-CM | POA: Diagnosis not present

## 2017-02-04 DIAGNOSIS — H04123 Dry eye syndrome of bilateral lacrimal glands: Secondary | ICD-10-CM | POA: Diagnosis not present

## 2017-02-04 DIAGNOSIS — H401190 Primary open-angle glaucoma, unspecified eye, stage unspecified: Secondary | ICD-10-CM | POA: Diagnosis not present

## 2017-02-06 DIAGNOSIS — K219 Gastro-esophageal reflux disease without esophagitis: Secondary | ICD-10-CM | POA: Diagnosis not present

## 2017-02-06 DIAGNOSIS — K5730 Diverticulosis of large intestine without perforation or abscess without bleeding: Secondary | ICD-10-CM | POA: Diagnosis not present

## 2017-02-06 DIAGNOSIS — I25119 Atherosclerotic heart disease of native coronary artery with unspecified angina pectoris: Secondary | ICD-10-CM | POA: Diagnosis not present

## 2017-02-06 DIAGNOSIS — H409 Unspecified glaucoma: Secondary | ICD-10-CM | POA: Diagnosis not present

## 2017-02-06 DIAGNOSIS — J301 Allergic rhinitis due to pollen: Secondary | ICD-10-CM | POA: Diagnosis not present

## 2017-02-06 DIAGNOSIS — R7301 Impaired fasting glucose: Secondary | ICD-10-CM | POA: Diagnosis not present

## 2017-02-06 DIAGNOSIS — N529 Male erectile dysfunction, unspecified: Secondary | ICD-10-CM | POA: Diagnosis not present

## 2017-02-06 DIAGNOSIS — I119 Hypertensive heart disease without heart failure: Secondary | ICD-10-CM | POA: Diagnosis not present

## 2017-02-06 DIAGNOSIS — E782 Mixed hyperlipidemia: Secondary | ICD-10-CM | POA: Diagnosis not present

## 2017-02-06 DIAGNOSIS — Z0000 Encounter for general adult medical examination without abnormal findings: Secondary | ICD-10-CM | POA: Diagnosis not present

## 2017-02-26 DIAGNOSIS — H04123 Dry eye syndrome of bilateral lacrimal glands: Secondary | ICD-10-CM | POA: Diagnosis not present

## 2017-03-05 DIAGNOSIS — L509 Urticaria, unspecified: Secondary | ICD-10-CM | POA: Diagnosis not present

## 2017-03-05 DIAGNOSIS — L239 Allergic contact dermatitis, unspecified cause: Secondary | ICD-10-CM | POA: Diagnosis not present

## 2017-04-11 DIAGNOSIS — H401112 Primary open-angle glaucoma, right eye, moderate stage: Secondary | ICD-10-CM | POA: Diagnosis not present

## 2017-04-11 DIAGNOSIS — H02005 Unspecified entropion of left lower eyelid: Secondary | ICD-10-CM | POA: Diagnosis not present

## 2017-04-11 DIAGNOSIS — H401122 Primary open-angle glaucoma, left eye, moderate stage: Secondary | ICD-10-CM | POA: Diagnosis not present

## 2017-07-16 DIAGNOSIS — H401131 Primary open-angle glaucoma, bilateral, mild stage: Secondary | ICD-10-CM | POA: Diagnosis not present

## 2017-07-16 DIAGNOSIS — H5203 Hypermetropia, bilateral: Secondary | ICD-10-CM | POA: Diagnosis not present

## 2017-07-16 DIAGNOSIS — H52223 Regular astigmatism, bilateral: Secondary | ICD-10-CM | POA: Diagnosis not present

## 2017-10-17 DIAGNOSIS — H401111 Primary open-angle glaucoma, right eye, mild stage: Secondary | ICD-10-CM | POA: Diagnosis not present

## 2017-10-17 DIAGNOSIS — H5340 Unspecified visual field defects: Secondary | ICD-10-CM | POA: Diagnosis not present

## 2017-10-17 DIAGNOSIS — H35372 Puckering of macula, left eye: Secondary | ICD-10-CM | POA: Diagnosis not present

## 2017-10-17 DIAGNOSIS — H52221 Regular astigmatism, right eye: Secondary | ICD-10-CM | POA: Diagnosis not present

## 2017-10-17 DIAGNOSIS — H5201 Hypermetropia, right eye: Secondary | ICD-10-CM | POA: Diagnosis not present

## 2017-10-17 DIAGNOSIS — H5212 Myopia, left eye: Secondary | ICD-10-CM | POA: Diagnosis not present

## 2017-10-17 DIAGNOSIS — H401121 Primary open-angle glaucoma, left eye, mild stage: Secondary | ICD-10-CM | POA: Diagnosis not present

## 2017-11-15 DIAGNOSIS — H401112 Primary open-angle glaucoma, right eye, moderate stage: Secondary | ICD-10-CM | POA: Diagnosis not present

## 2017-11-15 DIAGNOSIS — H401122 Primary open-angle glaucoma, left eye, moderate stage: Secondary | ICD-10-CM | POA: Diagnosis not present

## 2017-11-15 DIAGNOSIS — H02055 Trichiasis without entropian left lower eyelid: Secondary | ICD-10-CM | POA: Diagnosis not present

## 2017-12-25 DIAGNOSIS — H401111 Primary open-angle glaucoma, right eye, mild stage: Secondary | ICD-10-CM | POA: Diagnosis not present

## 2017-12-25 DIAGNOSIS — H401123 Primary open-angle glaucoma, left eye, severe stage: Secondary | ICD-10-CM | POA: Diagnosis not present

## 2017-12-25 DIAGNOSIS — H547 Unspecified visual loss: Secondary | ICD-10-CM | POA: Diagnosis not present

## 2017-12-25 DIAGNOSIS — H40011 Open angle with borderline findings, low risk, right eye: Secondary | ICD-10-CM | POA: Diagnosis not present

## 2018-03-05 DIAGNOSIS — H409 Unspecified glaucoma: Secondary | ICD-10-CM | POA: Diagnosis not present

## 2018-03-05 DIAGNOSIS — K219 Gastro-esophageal reflux disease without esophagitis: Secondary | ICD-10-CM | POA: Diagnosis not present

## 2018-03-05 DIAGNOSIS — K5730 Diverticulosis of large intestine without perforation or abscess without bleeding: Secondary | ICD-10-CM | POA: Diagnosis not present

## 2018-03-05 DIAGNOSIS — R7301 Impaired fasting glucose: Secondary | ICD-10-CM | POA: Diagnosis not present

## 2018-03-05 DIAGNOSIS — Z23 Encounter for immunization: Secondary | ICD-10-CM | POA: Diagnosis not present

## 2018-03-05 DIAGNOSIS — E782 Mixed hyperlipidemia: Secondary | ICD-10-CM | POA: Diagnosis not present

## 2018-03-05 DIAGNOSIS — I25119 Atherosclerotic heart disease of native coronary artery with unspecified angina pectoris: Secondary | ICD-10-CM | POA: Diagnosis not present

## 2018-03-05 DIAGNOSIS — I119 Hypertensive heart disease without heart failure: Secondary | ICD-10-CM | POA: Diagnosis not present

## 2018-03-05 DIAGNOSIS — N529 Male erectile dysfunction, unspecified: Secondary | ICD-10-CM | POA: Diagnosis not present

## 2018-03-05 DIAGNOSIS — Z0000 Encounter for general adult medical examination without abnormal findings: Secondary | ICD-10-CM | POA: Diagnosis not present

## 2018-03-05 DIAGNOSIS — J301 Allergic rhinitis due to pollen: Secondary | ICD-10-CM | POA: Diagnosis not present

## 2018-05-08 DIAGNOSIS — H401122 Primary open-angle glaucoma, left eye, moderate stage: Secondary | ICD-10-CM | POA: Diagnosis not present

## 2018-05-08 DIAGNOSIS — H02055 Trichiasis without entropian left lower eyelid: Secondary | ICD-10-CM | POA: Diagnosis not present

## 2018-05-08 DIAGNOSIS — H401112 Primary open-angle glaucoma, right eye, moderate stage: Secondary | ICD-10-CM | POA: Diagnosis not present

## 2018-10-10 DIAGNOSIS — H353121 Nonexudative age-related macular degeneration, left eye, early dry stage: Secondary | ICD-10-CM | POA: Diagnosis not present

## 2018-10-10 DIAGNOSIS — H353131 Nonexudative age-related macular degeneration, bilateral, early dry stage: Secondary | ICD-10-CM | POA: Diagnosis not present

## 2018-10-10 DIAGNOSIS — H401112 Primary open-angle glaucoma, right eye, moderate stage: Secondary | ICD-10-CM | POA: Diagnosis not present

## 2018-10-10 DIAGNOSIS — Z961 Presence of intraocular lens: Secondary | ICD-10-CM | POA: Diagnosis not present

## 2018-10-10 DIAGNOSIS — H5212 Myopia, left eye: Secondary | ICD-10-CM | POA: Diagnosis not present

## 2018-10-10 DIAGNOSIS — H401122 Primary open-angle glaucoma, left eye, moderate stage: Secondary | ICD-10-CM | POA: Diagnosis not present

## 2018-10-10 DIAGNOSIS — H02055 Trichiasis without entropian left lower eyelid: Secondary | ICD-10-CM | POA: Diagnosis not present

## 2018-10-10 DIAGNOSIS — H353111 Nonexudative age-related macular degeneration, right eye, early dry stage: Secondary | ICD-10-CM | POA: Diagnosis not present

## 2018-10-10 DIAGNOSIS — H52223 Regular astigmatism, bilateral: Secondary | ICD-10-CM | POA: Diagnosis not present

## 2018-10-10 DIAGNOSIS — H5201 Hypermetropia, right eye: Secondary | ICD-10-CM | POA: Diagnosis not present

## 2018-10-10 DIAGNOSIS — H35363 Drusen (degenerative) of macula, bilateral: Secondary | ICD-10-CM | POA: Diagnosis not present

## 2019-02-12 DIAGNOSIS — H5212 Myopia, left eye: Secondary | ICD-10-CM | POA: Diagnosis not present

## 2019-02-12 DIAGNOSIS — H401132 Primary open-angle glaucoma, bilateral, moderate stage: Secondary | ICD-10-CM | POA: Diagnosis not present

## 2019-02-12 DIAGNOSIS — H5201 Hypermetropia, right eye: Secondary | ICD-10-CM | POA: Diagnosis not present

## 2019-02-12 DIAGNOSIS — H524 Presbyopia: Secondary | ICD-10-CM | POA: Diagnosis not present

## 2019-02-12 DIAGNOSIS — H52223 Regular astigmatism, bilateral: Secondary | ICD-10-CM | POA: Diagnosis not present

## 2019-03-10 DIAGNOSIS — H5212 Myopia, left eye: Secondary | ICD-10-CM | POA: Diagnosis not present

## 2019-03-10 DIAGNOSIS — H52223 Regular astigmatism, bilateral: Secondary | ICD-10-CM | POA: Diagnosis not present

## 2019-03-10 DIAGNOSIS — H5201 Hypermetropia, right eye: Secondary | ICD-10-CM | POA: Diagnosis not present

## 2019-03-10 DIAGNOSIS — H524 Presbyopia: Secondary | ICD-10-CM | POA: Diagnosis not present

## 2019-03-10 DIAGNOSIS — H02005 Unspecified entropion of left lower eyelid: Secondary | ICD-10-CM | POA: Diagnosis not present

## 2019-03-24 DIAGNOSIS — I119 Hypertensive heart disease without heart failure: Secondary | ICD-10-CM | POA: Diagnosis not present

## 2019-03-24 DIAGNOSIS — E782 Mixed hyperlipidemia: Secondary | ICD-10-CM | POA: Diagnosis not present

## 2019-03-24 DIAGNOSIS — R7301 Impaired fasting glucose: Secondary | ICD-10-CM | POA: Diagnosis not present

## 2019-03-26 DIAGNOSIS — Z23 Encounter for immunization: Secondary | ICD-10-CM | POA: Diagnosis not present

## 2019-03-26 DIAGNOSIS — R7301 Impaired fasting glucose: Secondary | ICD-10-CM | POA: Diagnosis not present

## 2019-03-26 DIAGNOSIS — Z0000 Encounter for general adult medical examination without abnormal findings: Secondary | ICD-10-CM | POA: Diagnosis not present

## 2019-03-26 DIAGNOSIS — H3530 Unspecified macular degeneration: Secondary | ICD-10-CM | POA: Diagnosis not present

## 2019-03-26 DIAGNOSIS — H409 Unspecified glaucoma: Secondary | ICD-10-CM | POA: Diagnosis not present

## 2019-03-26 DIAGNOSIS — I25119 Atherosclerotic heart disease of native coronary artery with unspecified angina pectoris: Secondary | ICD-10-CM | POA: Diagnosis not present

## 2019-03-26 DIAGNOSIS — K5730 Diverticulosis of large intestine without perforation or abscess without bleeding: Secondary | ICD-10-CM | POA: Diagnosis not present

## 2019-03-26 DIAGNOSIS — I119 Hypertensive heart disease without heart failure: Secondary | ICD-10-CM | POA: Diagnosis not present

## 2019-03-26 DIAGNOSIS — J301 Allergic rhinitis due to pollen: Secondary | ICD-10-CM | POA: Diagnosis not present

## 2019-03-26 DIAGNOSIS — K219 Gastro-esophageal reflux disease without esophagitis: Secondary | ICD-10-CM | POA: Diagnosis not present

## 2019-03-26 DIAGNOSIS — E782 Mixed hyperlipidemia: Secondary | ICD-10-CM | POA: Diagnosis not present

## 2019-03-26 DIAGNOSIS — N529 Male erectile dysfunction, unspecified: Secondary | ICD-10-CM | POA: Diagnosis not present

## 2019-08-18 DIAGNOSIS — H401131 Primary open-angle glaucoma, bilateral, mild stage: Secondary | ICD-10-CM | POA: Diagnosis not present

## 2019-08-18 DIAGNOSIS — H5201 Hypermetropia, right eye: Secondary | ICD-10-CM | POA: Diagnosis not present

## 2019-08-18 DIAGNOSIS — H401191 Primary open-angle glaucoma, unspecified eye, mild stage: Secondary | ICD-10-CM | POA: Diagnosis not present

## 2019-08-18 DIAGNOSIS — H5212 Myopia, left eye: Secondary | ICD-10-CM | POA: Diagnosis not present

## 2019-08-18 DIAGNOSIS — H52223 Regular astigmatism, bilateral: Secondary | ICD-10-CM | POA: Diagnosis not present

## 2019-08-18 DIAGNOSIS — H524 Presbyopia: Secondary | ICD-10-CM | POA: Diagnosis not present

## 2020-02-16 DIAGNOSIS — H401131 Primary open-angle glaucoma, bilateral, mild stage: Secondary | ICD-10-CM | POA: Diagnosis not present

## 2020-02-16 DIAGNOSIS — H5340 Unspecified visual field defects: Secondary | ICD-10-CM | POA: Diagnosis not present

## 2020-02-16 DIAGNOSIS — H40019 Open angle with borderline findings, low risk, unspecified eye: Secondary | ICD-10-CM | POA: Diagnosis not present

## 2020-02-16 DIAGNOSIS — H401191 Primary open-angle glaucoma, unspecified eye, mild stage: Secondary | ICD-10-CM | POA: Diagnosis not present

## 2020-02-16 DIAGNOSIS — H40059 Ocular hypertension, unspecified eye: Secondary | ICD-10-CM | POA: Diagnosis not present

## 2020-02-16 DIAGNOSIS — H524 Presbyopia: Secondary | ICD-10-CM | POA: Diagnosis not present

## 2020-02-16 DIAGNOSIS — Z961 Presence of intraocular lens: Secondary | ICD-10-CM | POA: Diagnosis not present

## 2020-02-16 DIAGNOSIS — H52223 Regular astigmatism, bilateral: Secondary | ICD-10-CM | POA: Diagnosis not present

## 2020-02-16 DIAGNOSIS — H5203 Hypermetropia, bilateral: Secondary | ICD-10-CM | POA: Diagnosis not present

## 2020-04-20 DIAGNOSIS — I25119 Atherosclerotic heart disease of native coronary artery with unspecified angina pectoris: Secondary | ICD-10-CM | POA: Diagnosis not present

## 2020-04-20 DIAGNOSIS — K5730 Diverticulosis of large intestine without perforation or abscess without bleeding: Secondary | ICD-10-CM | POA: Diagnosis not present

## 2020-04-20 DIAGNOSIS — K219 Gastro-esophageal reflux disease without esophagitis: Secondary | ICD-10-CM | POA: Diagnosis not present

## 2020-04-20 DIAGNOSIS — R7301 Impaired fasting glucose: Secondary | ICD-10-CM | POA: Diagnosis not present

## 2020-04-20 DIAGNOSIS — J301 Allergic rhinitis due to pollen: Secondary | ICD-10-CM | POA: Diagnosis not present

## 2020-04-20 DIAGNOSIS — N529 Male erectile dysfunction, unspecified: Secondary | ICD-10-CM | POA: Diagnosis not present

## 2020-04-20 DIAGNOSIS — H3530 Unspecified macular degeneration: Secondary | ICD-10-CM | POA: Diagnosis not present

## 2020-04-20 DIAGNOSIS — E782 Mixed hyperlipidemia: Secondary | ICD-10-CM | POA: Diagnosis not present

## 2020-04-20 DIAGNOSIS — H409 Unspecified glaucoma: Secondary | ICD-10-CM | POA: Diagnosis not present

## 2020-04-20 DIAGNOSIS — I119 Hypertensive heart disease without heart failure: Secondary | ICD-10-CM | POA: Diagnosis not present

## 2020-04-20 DIAGNOSIS — Z0000 Encounter for general adult medical examination without abnormal findings: Secondary | ICD-10-CM | POA: Diagnosis not present

## 2020-06-03 DIAGNOSIS — M549 Dorsalgia, unspecified: Secondary | ICD-10-CM | POA: Diagnosis not present

## 2020-06-14 DIAGNOSIS — M5416 Radiculopathy, lumbar region: Secondary | ICD-10-CM | POA: Diagnosis not present

## 2020-06-24 DIAGNOSIS — M545 Low back pain, unspecified: Secondary | ICD-10-CM | POA: Diagnosis not present

## 2020-06-27 DIAGNOSIS — M5416 Radiculopathy, lumbar region: Secondary | ICD-10-CM | POA: Diagnosis not present

## 2020-07-04 DIAGNOSIS — M5416 Radiculopathy, lumbar region: Secondary | ICD-10-CM | POA: Diagnosis not present

## 2020-07-26 DIAGNOSIS — H5203 Hypermetropia, bilateral: Secondary | ICD-10-CM | POA: Diagnosis not present

## 2020-07-26 DIAGNOSIS — H52223 Regular astigmatism, bilateral: Secondary | ICD-10-CM | POA: Diagnosis not present

## 2020-07-26 DIAGNOSIS — H401122 Primary open-angle glaucoma, left eye, moderate stage: Secondary | ICD-10-CM | POA: Diagnosis not present

## 2020-07-26 DIAGNOSIS — H401112 Primary open-angle glaucoma, right eye, moderate stage: Secondary | ICD-10-CM | POA: Diagnosis not present

## 2020-07-26 DIAGNOSIS — H524 Presbyopia: Secondary | ICD-10-CM | POA: Diagnosis not present

## 2020-07-28 DIAGNOSIS — M5416 Radiculopathy, lumbar region: Secondary | ICD-10-CM | POA: Diagnosis not present

## 2020-08-23 DIAGNOSIS — M48062 Spinal stenosis, lumbar region with neurogenic claudication: Secondary | ICD-10-CM | POA: Diagnosis not present

## 2020-08-29 DIAGNOSIS — M6281 Muscle weakness (generalized): Secondary | ICD-10-CM | POA: Diagnosis not present

## 2020-08-29 DIAGNOSIS — M48062 Spinal stenosis, lumbar region with neurogenic claudication: Secondary | ICD-10-CM | POA: Diagnosis not present

## 2020-09-14 DIAGNOSIS — H52223 Regular astigmatism, bilateral: Secondary | ICD-10-CM | POA: Diagnosis not present

## 2020-09-14 DIAGNOSIS — Z961 Presence of intraocular lens: Secondary | ICD-10-CM | POA: Diagnosis not present

## 2020-09-14 DIAGNOSIS — H26491 Other secondary cataract, right eye: Secondary | ICD-10-CM | POA: Diagnosis not present

## 2020-09-14 DIAGNOSIS — H5203 Hypermetropia, bilateral: Secondary | ICD-10-CM | POA: Diagnosis not present

## 2020-09-14 DIAGNOSIS — H524 Presbyopia: Secondary | ICD-10-CM | POA: Diagnosis not present

## 2020-09-19 DIAGNOSIS — H26493 Other secondary cataract, bilateral: Secondary | ICD-10-CM | POA: Diagnosis not present

## 2020-09-19 DIAGNOSIS — H35373 Puckering of macula, bilateral: Secondary | ICD-10-CM | POA: Diagnosis not present

## 2020-09-19 DIAGNOSIS — I10 Essential (primary) hypertension: Secondary | ICD-10-CM | POA: Diagnosis not present

## 2020-09-19 DIAGNOSIS — H26491 Other secondary cataract, right eye: Secondary | ICD-10-CM | POA: Diagnosis not present

## 2020-09-26 DIAGNOSIS — H52223 Regular astigmatism, bilateral: Secondary | ICD-10-CM | POA: Diagnosis not present

## 2020-09-26 DIAGNOSIS — H353132 Nonexudative age-related macular degeneration, bilateral, intermediate dry stage: Secondary | ICD-10-CM | POA: Diagnosis not present

## 2020-09-26 DIAGNOSIS — Z961 Presence of intraocular lens: Secondary | ICD-10-CM | POA: Diagnosis not present

## 2020-09-26 DIAGNOSIS — Z9841 Cataract extraction status, right eye: Secondary | ICD-10-CM | POA: Diagnosis not present

## 2020-09-26 DIAGNOSIS — H5203 Hypermetropia, bilateral: Secondary | ICD-10-CM | POA: Diagnosis not present

## 2020-09-26 DIAGNOSIS — H524 Presbyopia: Secondary | ICD-10-CM | POA: Diagnosis not present

## 2020-09-26 DIAGNOSIS — H5989 Other postprocedural complications and disorders of eye and adnexa, not elsewhere classified: Secondary | ICD-10-CM | POA: Diagnosis not present

## 2020-10-21 DIAGNOSIS — M48062 Spinal stenosis, lumbar region with neurogenic claudication: Secondary | ICD-10-CM | POA: Diagnosis not present

## 2020-11-14 DIAGNOSIS — R059 Cough, unspecified: Secondary | ICD-10-CM | POA: Diagnosis not present

## 2020-11-14 DIAGNOSIS — R5383 Other fatigue: Secondary | ICD-10-CM | POA: Diagnosis not present

## 2020-11-14 DIAGNOSIS — R509 Fever, unspecified: Secondary | ICD-10-CM | POA: Diagnosis not present

## 2020-11-14 DIAGNOSIS — Z03818 Encounter for observation for suspected exposure to other biological agents ruled out: Secondary | ICD-10-CM | POA: Diagnosis not present

## 2020-11-14 DIAGNOSIS — J189 Pneumonia, unspecified organism: Secondary | ICD-10-CM | POA: Diagnosis not present

## 2020-11-18 DIAGNOSIS — J209 Acute bronchitis, unspecified: Secondary | ICD-10-CM | POA: Diagnosis not present

## 2020-11-18 DIAGNOSIS — R011 Cardiac murmur, unspecified: Secondary | ICD-10-CM | POA: Diagnosis not present

## 2020-11-21 ENCOUNTER — Other Ambulatory Visit (HOSPITAL_COMMUNITY): Payer: Self-pay | Admitting: Family Medicine

## 2020-11-21 DIAGNOSIS — R011 Cardiac murmur, unspecified: Secondary | ICD-10-CM

## 2020-12-13 DIAGNOSIS — H3530 Unspecified macular degeneration: Secondary | ICD-10-CM | POA: Diagnosis not present

## 2020-12-13 DIAGNOSIS — H35371 Puckering of macula, right eye: Secondary | ICD-10-CM | POA: Diagnosis not present

## 2020-12-13 DIAGNOSIS — H02055 Trichiasis without entropian left lower eyelid: Secondary | ICD-10-CM | POA: Diagnosis not present

## 2020-12-21 ENCOUNTER — Ambulatory Visit (HOSPITAL_COMMUNITY): Payer: Medicare Other

## 2020-12-27 ENCOUNTER — Ambulatory Visit (HOSPITAL_COMMUNITY): Payer: PPO | Attending: Cardiology

## 2020-12-27 ENCOUNTER — Other Ambulatory Visit: Payer: Self-pay

## 2020-12-27 DIAGNOSIS — R011 Cardiac murmur, unspecified: Secondary | ICD-10-CM | POA: Insufficient documentation

## 2020-12-27 LAB — ECHOCARDIOGRAM COMPLETE
AR max vel: 1.14 cm2
AV Area VTI: 1.16 cm2
AV Area mean vel: 1.13 cm2
AV Mean grad: 12.3 mmHg
AV Peak grad: 22.7 mmHg
Ao pk vel: 2.38 m/s
Area-P 1/2: 5.13 cm2
S' Lateral: 3.1 cm

## 2021-03-06 DIAGNOSIS — H9193 Unspecified hearing loss, bilateral: Secondary | ICD-10-CM | POA: Diagnosis not present

## 2021-03-06 DIAGNOSIS — H6122 Impacted cerumen, left ear: Secondary | ICD-10-CM | POA: Diagnosis not present

## 2021-03-06 DIAGNOSIS — I119 Hypertensive heart disease without heart failure: Secondary | ICD-10-CM | POA: Diagnosis not present

## 2021-03-30 NOTE — Progress Notes (Signed)
Cardiology Office Note   Date:  04/05/2021   ID:  Clarence Myers, DOB 12/27/1933, MRN [id number]  PCP:  Mayra Neer, MD  Cardiologist:   Aundreya Souffrant [place], MD   Chief Complaint  Patient presents with   Coronary Artery Disease      History of Present Illness: Clarence Myers is a 87 y.o. male who is seen at the request of Dr Brigitte Pulse for evaluation of murmur.  He has a history of CAD, HLD. Remote angioplasty of the LAD in 1992. Last ETT was done in 2015. Had recent Echo done for murmur. Noted mild to moderate AS. Normal LV function. Mild Aortic enlargement.   He denies any chest pain, dyspnea, palpitations, dizziness. Is limited with his activity due to back problems. States he was told to stop taking ASA at some point but doesn't know why.     Past Medical History:  Diagnosis Date   CAD (coronary artery disease)    Hyperlipidemia    Low back strain    Shoulder injury    Fell and injured Right shoulder    Past Surgical History:  Procedure Laterality Date   ANGIOPLASTY     1992-LAD   CARDIAC CATHETERIZATION       Current Outpatient Medications  Medication Sig Dispense Refill   aspirin 81 MG tablet Take 81 mg by mouth daily.       dorzolamide-timolol (COSOPT) 22.3-6.8 MG/ML ophthalmic solution 1 drop 2 (two) times daily.     LUMIGAN 0.01 % SOLN SMARTSIG:1 Drop(s) In Eye(s) Every Evening     Multiple Vitamin (MULTI-VITAMIN PO) Take by mouth daily.       multivitamin-lutein (OCUVITE-LUTEIN) CAPS Take 1 capsule by mouth daily.       Omega-3 Fatty Acids (FISH OIL PO) Take by mouth daily.       omeprazole (PRILOSEC) 20 MG capsule Take 20 mg by mouth daily.     RESTASIS 0.05 % ophthalmic emulsion 1 drop 2 (two) times daily.     simvastatin (ZOCOR) 40 MG tablet Take 40 mg by mouth daily.       Travoprost (TRAVATAN OP) Apply to eye.       Coenzyme Q10 (CO Q 10 PO) Take by mouth daily.   (Patient not taking: Reported on 04/05/2021)     No current facility-administered  medications for this visit.    Allergies:   Other    Social History:  The patient     Family History:  The patient's family history is not on file.    ROS:  Please see the history of present illness.   Otherwise, review of systems are positive for none.   All other systems are reviewed and negative.    PHYSICAL EXAM: VS:  BP 135/69   Pulse 66   Ht '5\' 3"'$  (1.6 m)   Wt 136 lb 3.2 oz (61.8 kg)   SpO2 97%   BMI 24.13 kg/m  , BMI Body mass index is 24.13 kg/m. GEN: Well nourished, well developed, in no acute distress HEENT: normal Neck: no JVD, carotid bruits, or masses Cardiac: RRR; no murmurs, rubs, or gallops,no edema  Respiratory:  clear to auscultation bilaterally, normal work of breathing GI: soft, nontender, nondistended, + BS MS: no deformity or atrophy Skin: warm and dry, no rash Neuro:  Strength and sensation are intact Psych: euthymic mood, full affect   EKG:  EKG is ordered today. The ekg ordered today demonstrates NSR rate 66. Normal. I have personally  reviewed and interpreted this study.    Recent Labs: No results found for requested labs within last 8760 hours.   Dat ed 04/20/20: cholesterol 159, triglycerides 67, HDL 53, LDL 92. A1c 6%. CMET normal  Lipid Panel    Component Value Date/Time   CHOL 161 06/09/2014 0908   TRIG 61 06/09/2014 0908   HDL 61 06/09/2014 0908   CHOLHDL 2.6 06/09/2014 0908   VLDL 12 06/09/2014 0908   LDLCALC 88 06/09/2014 0908      Wt Readings from Last 3 Encounters:  04/05/21 136 lb 3.2 oz (61.8 kg)  05/11/11 134 lb (60.8 kg)  04/26/10 136 lb (61.7 kg)      Other studies Reviewed: Additional studies/ records that were reviewed today include:   Marland Kitchen Exercise Treadmill Test   Pre-Exercise Testing Evaluation NSR, normal ECG   Test   Exercise Tolerance Test Ordering MD: Dayna Geurts [place], MD     Unique Test No: 1  Treadmill:  1  Indication for ETT: chest pain - rule out ischemia  Contraindication to ETT: No   Stress  Modality: exercise - treadmill  Cardiac Imaging Performed: non   Protocol: standard Bruce - maximal  Max BP:  218/85  Max MPHR (bpm):  142 85% MPR (bpm):  120  MPHR obtained (bpm):  150 % MPHR obtained:  104  Reached 85% MPHR (min:sec):  5:30   Total Exercise Time (min-sec):  5  Workload in METS:  7.0 Borg Scale: 15  Reason ETT Terminated:  SOB and General Fatigue      ST Segment Analysis At Rest:           normal ST segments - no evidence of significant ST depression With Exercise: significant ischemic ST depression -2-3 mm in the inferior leads, 1 mm in lead V5   Other Information Arrhythmia:  PVCs and a ventricular couplet during exercise           Angina during ETT:  absent (0) Quality of ETT:  diagnostic   ETT Interpretation:  abnormal - evidence of ST depression consistent with ischemia   Comments: Excellent exercise tolerance. Hypertensive response to exercise.   Recommendations: Consider further evaluation for CAD since the previous study was reported as normal in 2014. However, exercise capacity is unchanged from 2014 and the ST changes may be a post-CABG "false positive" abnormality.   Sanda Klein, MD, [facility] [phone] office [phone] pager   Echo 12/27/20: IMPRESSIONS     1. Left ventricular ejection fraction, by estimation, is 55 to 60%. Left  ventricular ejection fraction by 3D volume is 57 %. The left ventricle has  normal function. The left ventricle has no regional wall motion  abnormalities. There is mild left  ventricular hypertrophy. Left ventricular diastolic parameters are  consistent with Grade I diastolic dysfunction (impaired relaxation). The  average left ventricular global longitudinal strain is -23.8 %.   2. Right ventricular systolic function is normal. The right ventricular  size is normal. Tricuspid regurgitation signal is inadequate for assessing  PA pressure.   3. The mitral valve is normal in structure. No evidence  of mitral valve  regurgitation.   4. The aortic valve is calcified. There is moderate calcification of the  aortic valve. Aortic valve regurgitation is not visualized. Mild to  moderate aortic valve stenosis. Vmax 2.4 m/s, MG 13 mmHg, AVA 1.1 cm^2, DI  0.33   5. Aortic dilatation noted. There is dilatation of the ascending aorta,  measuring 40 mm.  6. The inferior vena cava is normal in size with greater than 50%  respiratory variability, suggesting right atrial pressure of 3 mmHg.   ASSESSMENT AND PLAN:  1.  CAD s/p remote POBA of the proximal LAD 30 years ago. Asymptomatic. Recommend he continue ASA 81 mg daily indefinitely. 2. Mild to moderate Aortic stenosis. Asymptomatic. Will repeat Echo in one year. 3. Mild aortic enlargement. Will repeat Echo  in one year. 4. HLD on statin.    Current medicines are reviewed at length with the patient today.  The patient does not have concerns regarding medicines.  The following changes have been made:  resume ASA 81 mg daily  Labs/ tests ordered today include:  No orders of the defined types were placed in this encounter.    Disposition:   FU with me  in 1 year with Echo.  Signed, Sonia Bromell [place], MD  04/05/2021 3:28 PM    [facility] [address], [place], [place], [address] Phone [phone], Fax [phone]

## 2021-04-05 ENCOUNTER — Other Ambulatory Visit: Payer: Self-pay

## 2021-04-05 ENCOUNTER — Ambulatory Visit: Payer: PPO | Admitting: Cardiology

## 2021-04-05 ENCOUNTER — Encounter: Payer: Self-pay | Admitting: Cardiology

## 2021-04-05 VITALS — BP 135/69 | HR 66 | Ht 63.0 in | Wt 136.2 lb

## 2021-04-05 DIAGNOSIS — E7800 Pure hypercholesterolemia, unspecified: Secondary | ICD-10-CM | POA: Diagnosis not present

## 2021-04-05 DIAGNOSIS — I350 Nonrheumatic aortic (valve) stenosis: Secondary | ICD-10-CM

## 2021-04-05 DIAGNOSIS — I2510 Atherosclerotic heart disease of native coronary artery without angina pectoris: Secondary | ICD-10-CM

## 2021-04-05 NOTE — Addendum Note (Signed)
Addended by: Kathyrn Lass on: 04/05/2021 03:46 PM   Modules accepted: Orders

## 2021-04-14 DIAGNOSIS — J069 Acute upper respiratory infection, unspecified: Secondary | ICD-10-CM | POA: Diagnosis not present

## 2021-04-26 DIAGNOSIS — R7301 Impaired fasting glucose: Secondary | ICD-10-CM | POA: Diagnosis not present

## 2021-04-26 DIAGNOSIS — H3530 Unspecified macular degeneration: Secondary | ICD-10-CM | POA: Diagnosis not present

## 2021-04-26 DIAGNOSIS — J301 Allergic rhinitis due to pollen: Secondary | ICD-10-CM | POA: Diagnosis not present

## 2021-04-26 DIAGNOSIS — I25119 Atherosclerotic heart disease of native coronary artery with unspecified angina pectoris: Secondary | ICD-10-CM | POA: Diagnosis not present

## 2021-04-26 DIAGNOSIS — N529 Male erectile dysfunction, unspecified: Secondary | ICD-10-CM | POA: Diagnosis not present

## 2021-04-26 DIAGNOSIS — K219 Gastro-esophageal reflux disease without esophagitis: Secondary | ICD-10-CM | POA: Diagnosis not present

## 2021-04-26 DIAGNOSIS — E782 Mixed hyperlipidemia: Secondary | ICD-10-CM | POA: Diagnosis not present

## 2021-04-26 DIAGNOSIS — H409 Unspecified glaucoma: Secondary | ICD-10-CM | POA: Diagnosis not present

## 2021-04-26 DIAGNOSIS — I350 Nonrheumatic aortic (valve) stenosis: Secondary | ICD-10-CM | POA: Diagnosis not present

## 2021-04-26 DIAGNOSIS — K5730 Diverticulosis of large intestine without perforation or abscess without bleeding: Secondary | ICD-10-CM | POA: Diagnosis not present

## 2021-04-26 DIAGNOSIS — Z0000 Encounter for general adult medical examination without abnormal findings: Secondary | ICD-10-CM | POA: Diagnosis not present

## 2021-04-26 DIAGNOSIS — I119 Hypertensive heart disease without heart failure: Secondary | ICD-10-CM | POA: Diagnosis not present

## 2021-06-06 DIAGNOSIS — H02001 Unspecified entropion of right upper eyelid: Secondary | ICD-10-CM | POA: Diagnosis not present

## 2021-06-06 DIAGNOSIS — M3501 Sicca syndrome with keratoconjunctivitis: Secondary | ICD-10-CM | POA: Diagnosis not present

## 2021-06-06 DIAGNOSIS — H02403 Unspecified ptosis of bilateral eyelids: Secondary | ICD-10-CM | POA: Diagnosis not present

## 2021-06-07 DIAGNOSIS — Z23 Encounter for immunization: Secondary | ICD-10-CM | POA: Diagnosis not present

## 2021-06-13 DIAGNOSIS — H401131 Primary open-angle glaucoma, bilateral, mild stage: Secondary | ICD-10-CM | POA: Diagnosis not present

## 2021-06-19 DIAGNOSIS — H401190 Primary open-angle glaucoma, unspecified eye, stage unspecified: Secondary | ICD-10-CM | POA: Diagnosis not present

## 2021-07-19 DIAGNOSIS — L438 Other lichen planus: Secondary | ICD-10-CM | POA: Diagnosis not present

## 2021-07-20 DIAGNOSIS — L570 Actinic keratosis: Secondary | ICD-10-CM | POA: Diagnosis not present

## 2021-07-20 DIAGNOSIS — M545 Low back pain, unspecified: Secondary | ICD-10-CM | POA: Diagnosis not present

## 2021-07-24 DIAGNOSIS — M545 Low back pain, unspecified: Secondary | ICD-10-CM | POA: Diagnosis not present

## 2021-07-27 DIAGNOSIS — M545 Low back pain, unspecified: Secondary | ICD-10-CM | POA: Diagnosis not present

## 2021-07-31 DIAGNOSIS — M545 Low back pain, unspecified: Secondary | ICD-10-CM | POA: Diagnosis not present

## 2021-08-01 DIAGNOSIS — H401190 Primary open-angle glaucoma, unspecified eye, stage unspecified: Secondary | ICD-10-CM | POA: Diagnosis not present

## 2021-08-07 DIAGNOSIS — M545 Low back pain, unspecified: Secondary | ICD-10-CM | POA: Diagnosis not present

## 2021-08-22 DIAGNOSIS — M545 Low back pain, unspecified: Secondary | ICD-10-CM | POA: Diagnosis not present

## 2021-08-22 DIAGNOSIS — H02055 Trichiasis without entropian left lower eyelid: Secondary | ICD-10-CM | POA: Diagnosis not present

## 2021-08-22 DIAGNOSIS — H1089 Other conjunctivitis: Secondary | ICD-10-CM | POA: Diagnosis not present

## 2021-08-24 DIAGNOSIS — M545 Low back pain, unspecified: Secondary | ICD-10-CM | POA: Diagnosis not present

## 2021-08-29 DIAGNOSIS — M545 Low back pain, unspecified: Secondary | ICD-10-CM | POA: Diagnosis not present

## 2021-09-01 DIAGNOSIS — M545 Low back pain, unspecified: Secondary | ICD-10-CM | POA: Diagnosis not present

## 2021-09-05 DIAGNOSIS — M545 Low back pain, unspecified: Secondary | ICD-10-CM | POA: Diagnosis not present

## 2021-09-07 DIAGNOSIS — M545 Low back pain, unspecified: Secondary | ICD-10-CM | POA: Diagnosis not present

## 2021-09-13 DIAGNOSIS — H02055 Trichiasis without entropian left lower eyelid: Secondary | ICD-10-CM | POA: Diagnosis not present

## 2021-09-13 DIAGNOSIS — H401190 Primary open-angle glaucoma, unspecified eye, stage unspecified: Secondary | ICD-10-CM | POA: Diagnosis not present

## 2021-09-26 DIAGNOSIS — H401190 Primary open-angle glaucoma, unspecified eye, stage unspecified: Secondary | ICD-10-CM | POA: Diagnosis not present

## 2021-09-29 ENCOUNTER — Emergency Department (HOSPITAL_COMMUNITY): Payer: PPO

## 2021-09-29 ENCOUNTER — Inpatient Hospital Stay (HOSPITAL_COMMUNITY)
Admission: EM | Admit: 2021-09-29 | Discharge: 2021-10-10 | DRG: 064 | Disposition: A | Discharge status: Skilled Nursing Facility | Payer: PPO | Attending: Internal Medicine | Admitting: Internal Medicine

## 2021-09-29 ENCOUNTER — Other Ambulatory Visit: Payer: Self-pay

## 2021-09-29 ENCOUNTER — Encounter (HOSPITAL_COMMUNITY): Payer: Self-pay | Admitting: *Deleted

## 2021-09-29 DIAGNOSIS — R1312 Dysphagia, oropharyngeal phase: Secondary | ICD-10-CM | POA: Diagnosis not present

## 2021-09-29 DIAGNOSIS — M6281 Muscle weakness (generalized): Secondary | ICD-10-CM | POA: Diagnosis not present

## 2021-09-29 DIAGNOSIS — Z20822 Contact with and (suspected) exposure to covid-19: Secondary | ICD-10-CM | POA: Diagnosis not present

## 2021-09-29 DIAGNOSIS — Z7982 Long term (current) use of aspirin: Secondary | ICD-10-CM | POA: Diagnosis not present

## 2021-09-29 DIAGNOSIS — E876 Hypokalemia: Secondary | ICD-10-CM

## 2021-09-29 DIAGNOSIS — I639 Cerebral infarction, unspecified: Secondary | ICD-10-CM | POA: Diagnosis not present

## 2021-09-29 DIAGNOSIS — I10 Essential (primary) hypertension: Secondary | ICD-10-CM | POA: Diagnosis present

## 2021-09-29 DIAGNOSIS — R1311 Dysphagia, oral phase: Secondary | ICD-10-CM | POA: Diagnosis not present

## 2021-09-29 DIAGNOSIS — R29810 Facial weakness: Secondary | ICD-10-CM | POA: Diagnosis present

## 2021-09-29 DIAGNOSIS — G8191 Hemiplegia, unspecified affecting right dominant side: Secondary | ICD-10-CM | POA: Diagnosis not present

## 2021-09-29 DIAGNOSIS — I2510 Atherosclerotic heart disease of native coronary artery without angina pectoris: Secondary | ICD-10-CM | POA: Diagnosis present

## 2021-09-29 DIAGNOSIS — J690 Pneumonitis due to inhalation of food and vomit: Secondary | ICD-10-CM | POA: Diagnosis not present

## 2021-09-29 DIAGNOSIS — I6523 Occlusion and stenosis of bilateral carotid arteries: Secondary | ICD-10-CM | POA: Diagnosis not present

## 2021-09-29 DIAGNOSIS — J189 Pneumonia, unspecified organism: Secondary | ICD-10-CM | POA: Diagnosis not present

## 2021-09-29 DIAGNOSIS — R1310 Dysphagia, unspecified: Secondary | ICD-10-CM | POA: Diagnosis not present

## 2021-09-29 DIAGNOSIS — R296 Repeated falls: Secondary | ICD-10-CM | POA: Diagnosis not present

## 2021-09-29 DIAGNOSIS — R4781 Slurred speech: Secondary | ICD-10-CM | POA: Diagnosis not present

## 2021-09-29 DIAGNOSIS — M549 Dorsalgia, unspecified: Secondary | ICD-10-CM | POA: Diagnosis not present

## 2021-09-29 DIAGNOSIS — T17908D Unspecified foreign body in respiratory tract, part unspecified causing other injury, subsequent encounter: Secondary | ICD-10-CM | POA: Diagnosis not present

## 2021-09-29 DIAGNOSIS — R270 Ataxia, unspecified: Secondary | ICD-10-CM | POA: Diagnosis not present

## 2021-09-29 DIAGNOSIS — I6621 Occlusion and stenosis of right posterior cerebral artery: Secondary | ICD-10-CM | POA: Diagnosis present

## 2021-09-29 DIAGNOSIS — Z7401 Bed confinement status: Secondary | ICD-10-CM | POA: Diagnosis not present

## 2021-09-29 DIAGNOSIS — I69391 Dysphagia following cerebral infarction: Secondary | ICD-10-CM | POA: Diagnosis not present

## 2021-09-29 DIAGNOSIS — I350 Nonrheumatic aortic (valve) stenosis: Secondary | ICD-10-CM | POA: Diagnosis present

## 2021-09-29 DIAGNOSIS — I6389 Other cerebral infarction: Secondary | ICD-10-CM | POA: Diagnosis not present

## 2021-09-29 DIAGNOSIS — G459 Transient cerebral ischemic attack, unspecified: Secondary | ICD-10-CM | POA: Diagnosis not present

## 2021-09-29 DIAGNOSIS — R059 Cough, unspecified: Secondary | ICD-10-CM | POA: Diagnosis not present

## 2021-09-29 DIAGNOSIS — I6329 Cerebral infarction due to unspecified occlusion or stenosis of other precerebral arteries: Secondary | ICD-10-CM | POA: Diagnosis not present

## 2021-09-29 DIAGNOSIS — Z79899 Other long term (current) drug therapy: Secondary | ICD-10-CM | POA: Diagnosis not present

## 2021-09-29 DIAGNOSIS — T17908A Unspecified foreign body in respiratory tract, part unspecified causing other injury, initial encounter: Secondary | ICD-10-CM

## 2021-09-29 DIAGNOSIS — E785 Hyperlipidemia, unspecified: Secondary | ICD-10-CM | POA: Diagnosis not present

## 2021-09-29 DIAGNOSIS — K219 Gastro-esophageal reflux disease without esophagitis: Secondary | ICD-10-CM | POA: Diagnosis not present

## 2021-09-29 DIAGNOSIS — H409 Unspecified glaucoma: Secondary | ICD-10-CM | POA: Diagnosis present

## 2021-09-29 DIAGNOSIS — I6503 Occlusion and stenosis of bilateral vertebral arteries: Secondary | ICD-10-CM | POA: Diagnosis not present

## 2021-09-29 DIAGNOSIS — Z8673 Personal history of transient ischemic attack (TIA), and cerebral infarction without residual deficits: Secondary | ICD-10-CM | POA: Diagnosis not present

## 2021-09-29 DIAGNOSIS — R471 Dysarthria and anarthria: Secondary | ICD-10-CM | POA: Diagnosis present

## 2021-09-29 DIAGNOSIS — Z7901 Long term (current) use of anticoagulants: Secondary | ICD-10-CM | POA: Diagnosis not present

## 2021-09-29 DIAGNOSIS — G8101 Flaccid hemiplegia affecting right dominant side: Secondary | ICD-10-CM | POA: Diagnosis not present

## 2021-09-29 DIAGNOSIS — I69351 Hemiplegia and hemiparesis following cerebral infarction affecting right dominant side: Secondary | ICD-10-CM | POA: Diagnosis not present

## 2021-09-29 DIAGNOSIS — G319 Degenerative disease of nervous system, unspecified: Secondary | ICD-10-CM | POA: Diagnosis not present

## 2021-09-29 DIAGNOSIS — I672 Cerebral atherosclerosis: Secondary | ICD-10-CM | POA: Diagnosis not present

## 2021-09-29 DIAGNOSIS — R531 Weakness: Secondary | ICD-10-CM | POA: Diagnosis not present

## 2021-09-29 DIAGNOSIS — R2681 Unsteadiness on feet: Secondary | ICD-10-CM | POA: Diagnosis not present

## 2021-09-29 DIAGNOSIS — R9431 Abnormal electrocardiogram [ECG] [EKG]: Secondary | ICD-10-CM | POA: Diagnosis not present

## 2021-09-29 DIAGNOSIS — E782 Mixed hyperlipidemia: Secondary | ICD-10-CM | POA: Diagnosis not present

## 2021-09-29 DIAGNOSIS — R279 Unspecified lack of coordination: Secondary | ICD-10-CM | POA: Diagnosis not present

## 2021-09-29 HISTORY — DX: Hypokalemia: E87.6

## 2021-09-29 HISTORY — DX: Cerebral infarction, unspecified: I63.9

## 2021-09-29 LAB — DIFFERENTIAL
Abs Immature Granulocytes: 0.03 10*3/uL (ref 0.00–0.07)
Basophils Absolute: 0.1 10*3/uL (ref 0.0–0.1)
Basophils Relative: 1 %
Eosinophils Absolute: 0.3 10*3/uL (ref 0.0–0.5)
Eosinophils Relative: 4 %
Immature Granulocytes: 0 %
Lymphocytes Relative: 28 %
Lymphs Abs: 2.6 10*3/uL (ref 0.7–4.0)
Monocytes Absolute: 0.8 10*3/uL (ref 0.1–1.0)
Monocytes Relative: 9 %
Neutro Abs: 5.3 10*3/uL (ref 1.7–7.7)
Neutrophils Relative %: 58 %

## 2021-09-29 LAB — RAPID URINE DRUG SCREEN, HOSP PERFORMED
Amphetamines: NOT DETECTED
Barbiturates: NOT DETECTED
Benzodiazepines: NOT DETECTED
Cocaine: NOT DETECTED
Opiates: NOT DETECTED
Tetrahydrocannabinol: NOT DETECTED

## 2021-09-29 LAB — COMPREHENSIVE METABOLIC PANEL
ALT: 13 U/L (ref 0–44)
AST: 16 U/L (ref 15–41)
Albumin: 3.7 g/dL (ref 3.5–5.0)
Alkaline Phosphatase: 55 U/L (ref 38–126)
Anion gap: 9 (ref 5–15)
BUN: 23 mg/dL (ref 8–23)
CO2: 20 mmol/L — ABNORMAL LOW (ref 22–32)
Calcium: 9 mg/dL (ref 8.9–10.3)
Chloride: 110 mmol/L (ref 98–111)
Creatinine, Ser: 1.06 mg/dL (ref 0.61–1.24)
GFR, Estimated: >60 mL/min (ref >60)
Glucose, Bld: 111 mg/dL — ABNORMAL HIGH (ref 70–99)
Potassium: 3.4 mmol/L — ABNORMAL LOW (ref 3.5–5.1)
Sodium: 139 mmol/L (ref 135–145)
Total Bilirubin: 0.5 mg/dL (ref 0.3–1.2)
Total Protein: 7.1 g/dL (ref 6.5–8.1)

## 2021-09-29 LAB — URINALYSIS, ROUTINE W REFLEX MICROSCOPIC
Bilirubin Urine: NEGATIVE
Glucose, UA: NEGATIVE mg/dL
Hgb urine dipstick: NEGATIVE
Ketones, ur: NEGATIVE mg/dL
Leukocytes,Ua: NEGATIVE
Nitrite: NEGATIVE
Protein, ur: NEGATIVE mg/dL
Specific Gravity, Urine: 1.016 (ref 1.005–1.030)
pH: 5 (ref 5.0–8.0)

## 2021-09-29 LAB — I-STAT CHEM 8, ED
BUN: 24 mg/dL — ABNORMAL HIGH (ref 8–23)
Calcium, Ion: 1.29 mmol/L (ref 1.15–1.40)
Chloride: 110 mmol/L (ref 98–111)
Creatinine, Ser: 1.1 mg/dL (ref 0.61–1.24)
Glucose, Bld: 108 mg/dL — ABNORMAL HIGH (ref 70–99)
HCT: 40 % (ref 39.0–52.0)
Hemoglobin: 13.6 g/dL (ref 13.0–17.0)
Potassium: 3.3 mmol/L — ABNORMAL LOW (ref 3.5–5.1)
Sodium: 141 mmol/L (ref 135–145)
TCO2: 20 mmol/L — ABNORMAL LOW (ref 22–32)

## 2021-09-29 LAB — PROTIME-INR
INR: 1 (ref 0.8–1.2)
Prothrombin Time: 13 seconds (ref 11.4–15.2)

## 2021-09-29 LAB — CBC
HCT: 40.1 % (ref 39.0–52.0)
Hemoglobin: 12.7 g/dL — ABNORMAL LOW (ref 13.0–17.0)
MCH: 31.1 pg (ref 26.0–34.0)
MCHC: 31.7 g/dL (ref 30.0–36.0)
MCV: 98 fL (ref 80.0–100.0)
Platelets: 293 10*3/uL (ref 150–400)
RBC: 4.09 MIL/uL — ABNORMAL LOW (ref 4.22–5.81)
RDW: 13.2 % (ref 11.5–15.5)
WBC: 9.1 10*3/uL (ref 4.0–10.5)
nRBC: 0 % (ref 0.0–0.2)

## 2021-09-29 LAB — RESP PANEL BY RT-PCR (FLU A&B, COVID) ARPGX2
Influenza A by PCR: NEGATIVE
Influenza B by PCR: NEGATIVE
SARS Coronavirus 2 by RT PCR: NEGATIVE

## 2021-09-29 LAB — ETHANOL: Alcohol, Ethyl (B): <10 mg/dL (ref <10)

## 2021-09-29 LAB — APTT: aPTT: 28 seconds (ref 24–36)

## 2021-09-29 IMAGING — MR MR HEAD W/O CM
9 of 10 series · 38 of 48 positions shown · non-contrast
Comparison: Head CT [DATE]

CLINICAL DATA: Neuro deficit, acute, stroke suspected. Slurred
speech. Generalized weakness with multiple falls.

EXAM:
MRI HEAD WITHOUT CONTRAST
TECHNIQUE: Multiplanar, multiecho pulse sequences of the brain and surrounding
structures were obtained without intravenous contrast.

[Series 3: DWI · axial · 3.0mm · 1.09mm/px · z∈[-61,+95]mm · 11 of 106 slices shown (1 of 4)]
[im 1/106]
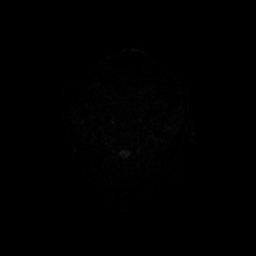
[im 11/106]
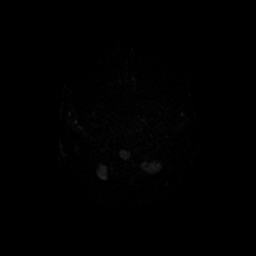
[im 22/106]
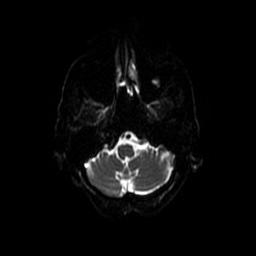
[im 32/106]
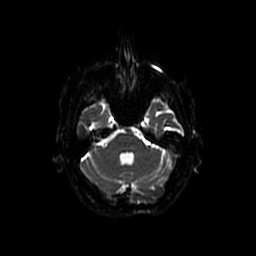
[im 43/106]
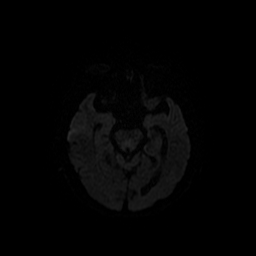
[im 53/106]
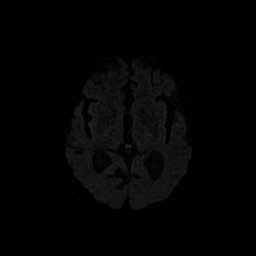
[im 64/106]
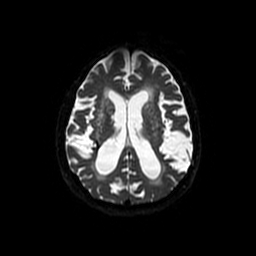
[im 74/106]
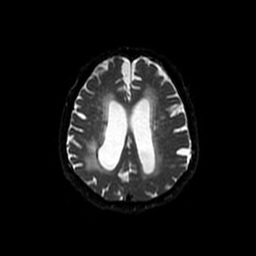
[im 85/106]
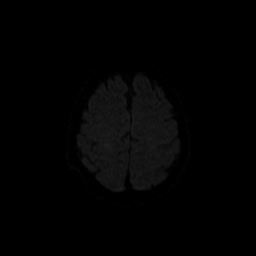
[im 95/106]
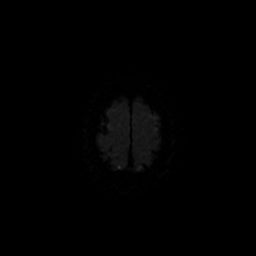
[im 106/106]
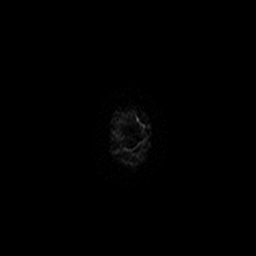

[Series 4: DWI · coronal · 5.0mm · 1.09mm/px · 7 of 74 slices shown (2 of 4)]
[im 1/74]
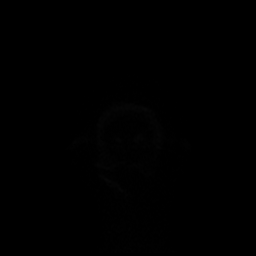
[im 13/74]
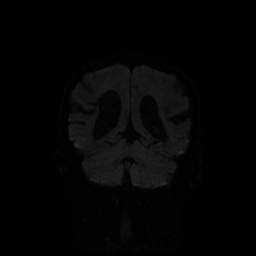
[im 25/74]
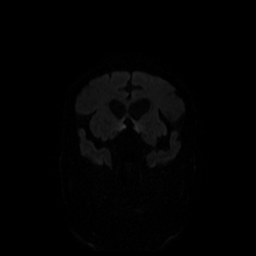
[im 37/74]
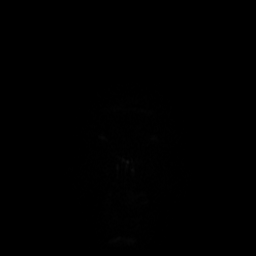
[im 49/74]
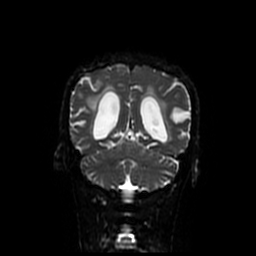
[im 61/74]
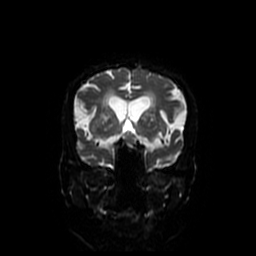
[im 74/74]
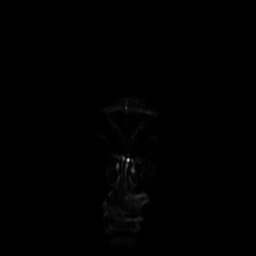

[Series 5: T1 · sagittal · 5.0mm · 0.47mm/px · 2 of 26 slices shown (1 of 2)]
[im 1/26]
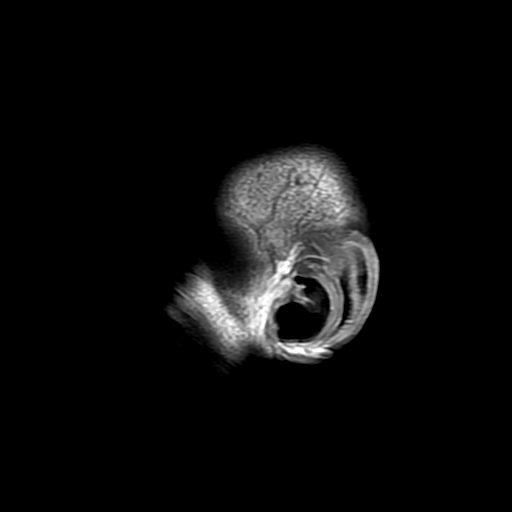
[im 26/26]
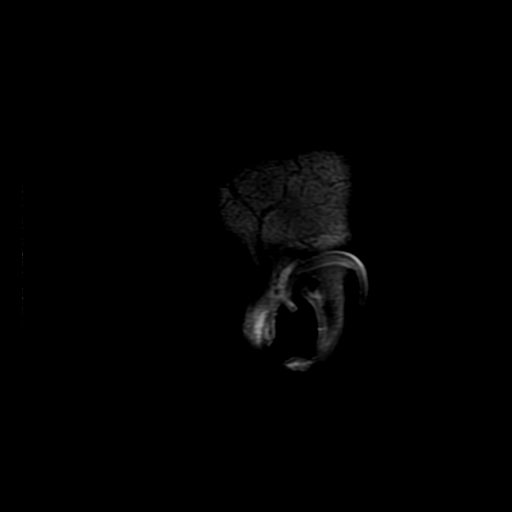

[Series 6: T2 · axial · 5.0mm · 0.47mm/px · z∈[-53,+97]mm · 2 of 26 slices shown (1 of 2)]
[im 1/26]
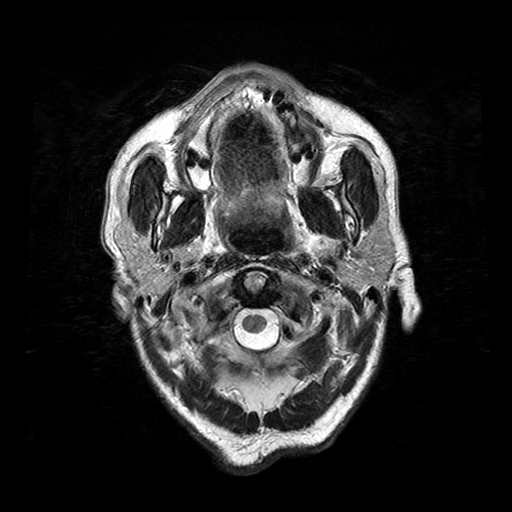
[im 26/26]
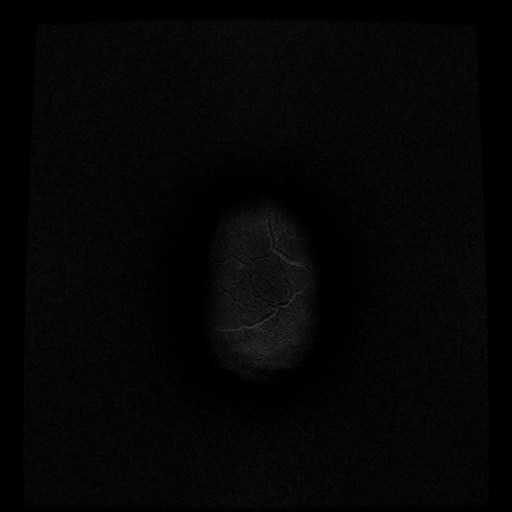

[Series 7: FLAIR · axial · 3.0mm · 0.47mm/px · z∈[-53,+97]mm · 2 of 26 slices shown]
[im 1/26]
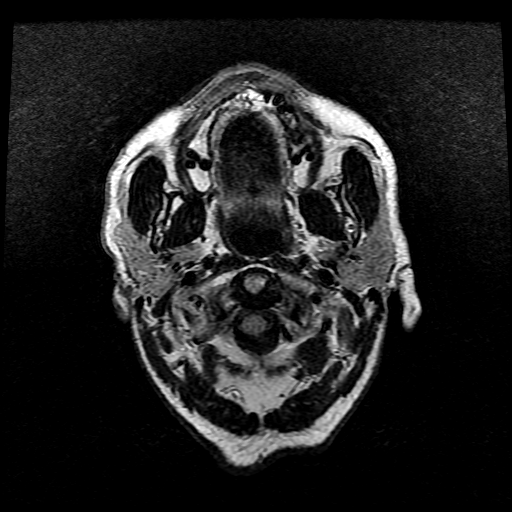
[im 26/26]
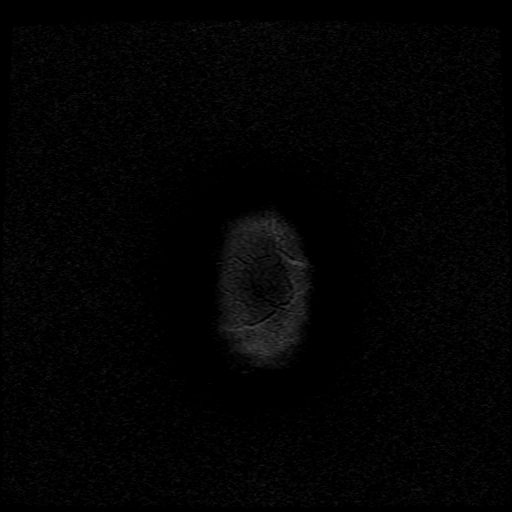

[Series 9: T1 · axial · 3.0mm · 0.47mm/px · z∈[-55,-38]mm · 2 of 104 slices shown (2 of 2)]
[im 1/104]
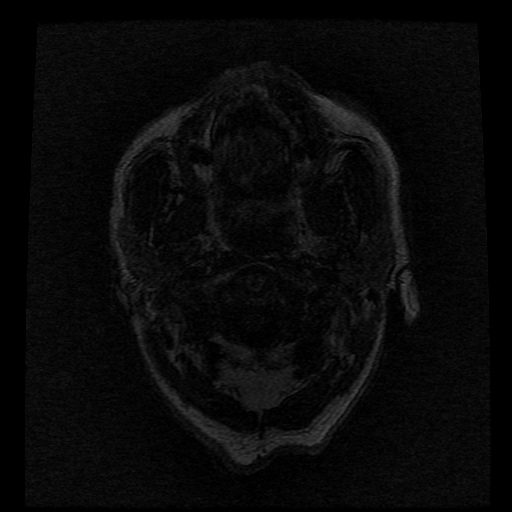
[im 12/104]
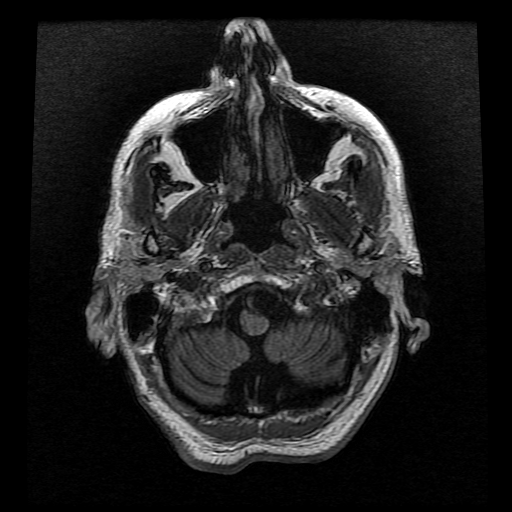

[Series 10: T2 · coronal · 5.0mm · 0.39mm/px · 3 of 28 slices shown (2 of 2)]
[im 1/28]
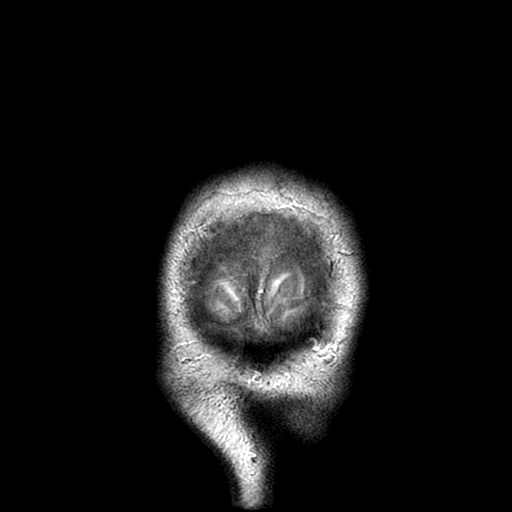
[im 14/28]
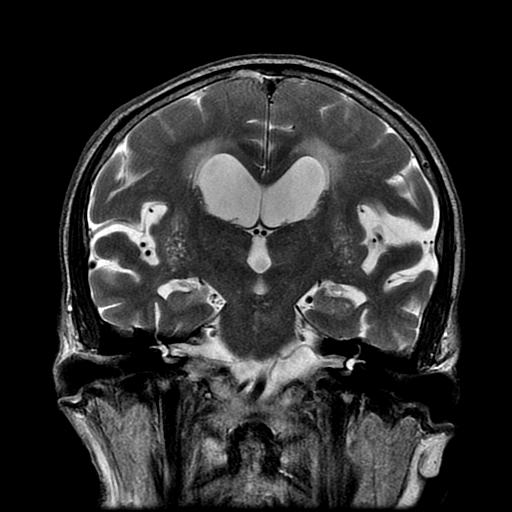
[im 28/28]
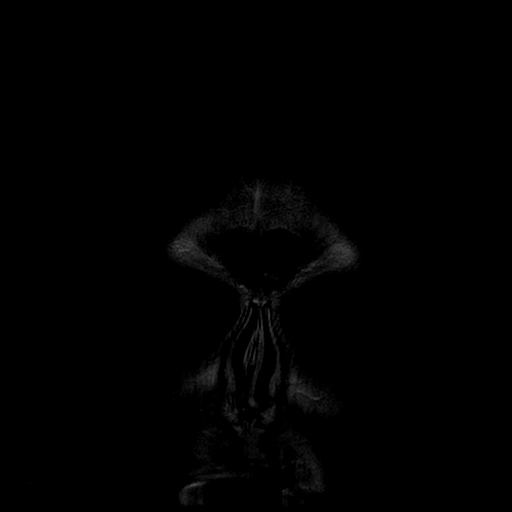

[Series 300: DWI · axial · 3.0mm · 1.09mm/px · z∈[-61,+95]mm · 5 of 53 slices shown (3 of 4)]
[im 1/53]
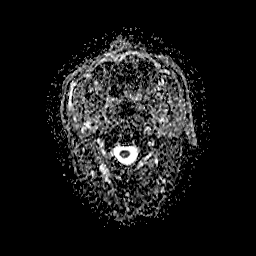
[im 14/53]
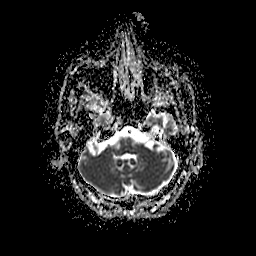
[im 27/53]
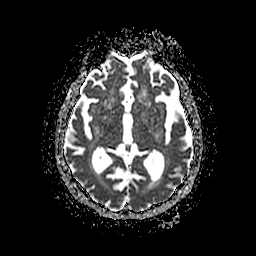
[im 40/53]
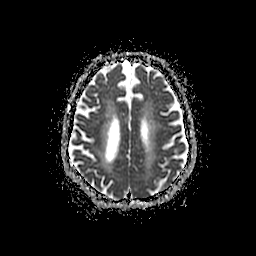
[im 53/53]
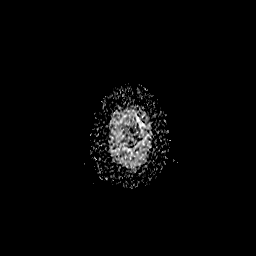

[Series 400: DWI · coronal · 5.0mm · 1.09mm/px · 4 of 37 slices shown (4 of 4)]
[im 1/37]
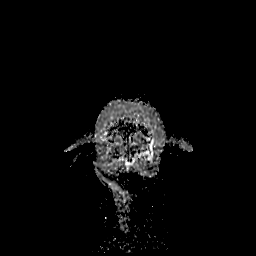
[im 13/37]
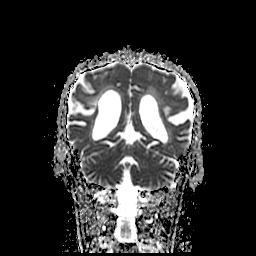
[im 25/37]
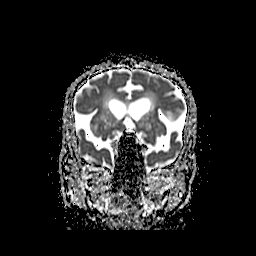
[im 37/37]
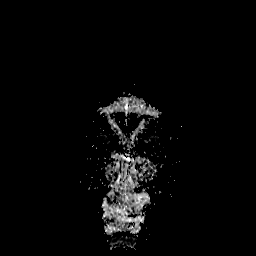

[38 of 48 positions shown; findings below may reference images not displayed]

FINDINGS: Brain: Subcentimeter acute infarcts are present in the left
paracentral pons and left genu of the corpus callosum. There is
encephalomalacia in the right parietal lobe with associated chronic
blood products. Patchy and confluent T2 hyperintensities in the
cerebral white matter bilaterally are nonspecific but compatible
with moderate to severe chronic small vessel ischemic disease. Mild
chronic small vessel changes are present in the pons. Moderate
lateral ventriculomegaly is favored to be secondary to cerebral
atrophy rather than hydrocephalus. Dilated perivascular spaces are
present throughout the basal ganglia. No mass, midline shift, or
extra-axial fluid collection is identified.

Vascular: Major intracranial vascular flow voids are preserved.

Skull and upper cervical spine: Unremarkable bone marrow signal.

Sinuses/Orbits: Bilateral cataract extraction. Small mucous
retention cyst in the left maxillary sinus. Clear mastoid air cells.

Other: None.
IMPRESSION: 1. Small acute pontine and corpus callosum infarcts.
2. Moderate to severe chronic small vessel ischemic disease and
cerebral atrophy.
3. Chronic right parietal hemorrhagic infarct.

## 2021-09-29 IMAGING — CT CT HEAD W/O CM
3 series · 15 of 47 positions shown, 18 images · non-contrast
Comparison: None.

CLINICAL DATA: Neuro deficit, acute, stroke suspected



[Series 3: head 5.0 h30s · axial · 0.48mm/px · z∈[-209,-84]mm · 9 of 31 slices shown, 12 images]
[im 3/31  brain]
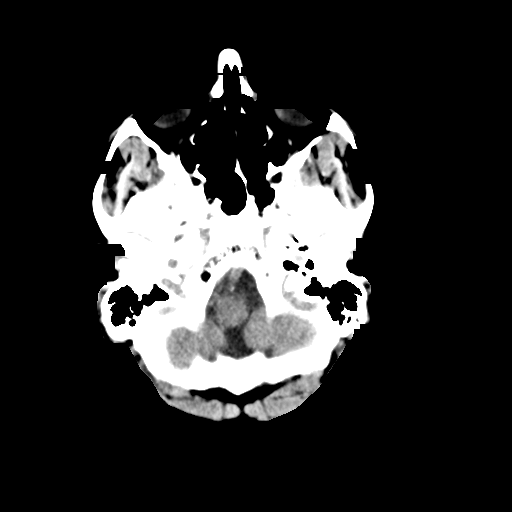
[im 3/31  bone]
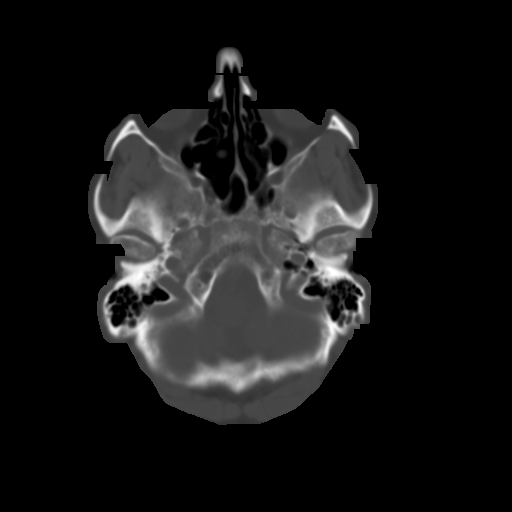
[im 6/31  brain]
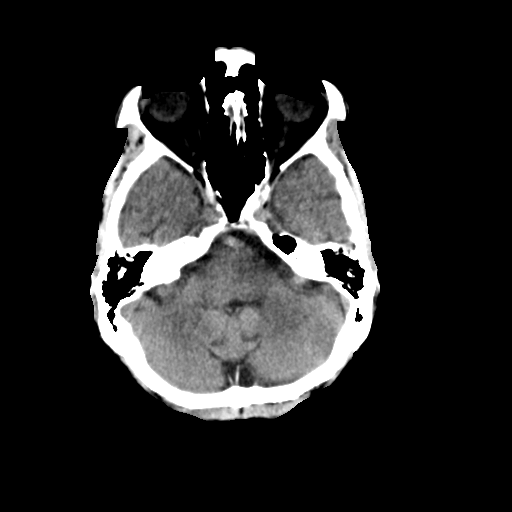
[im 9/31  brain]
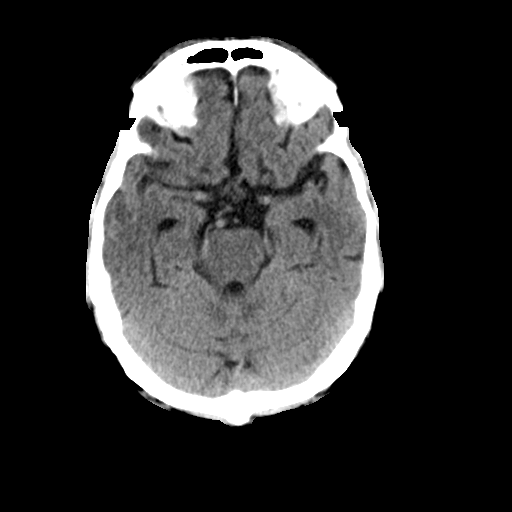
[im 12/31  brain]
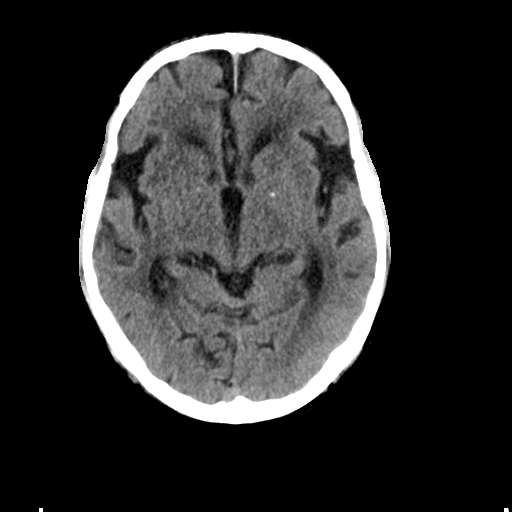
[im 16/31  brain]
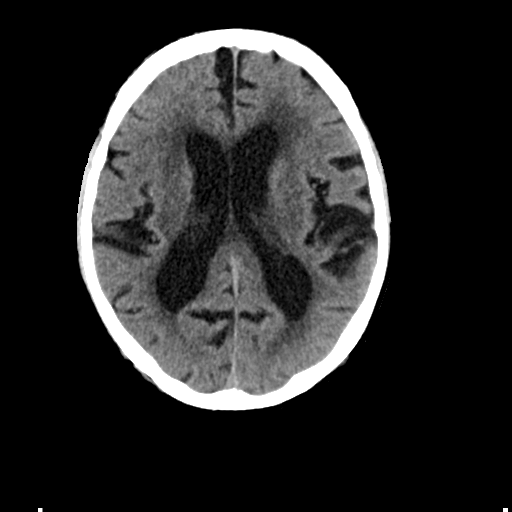
[im 16/31  bone]
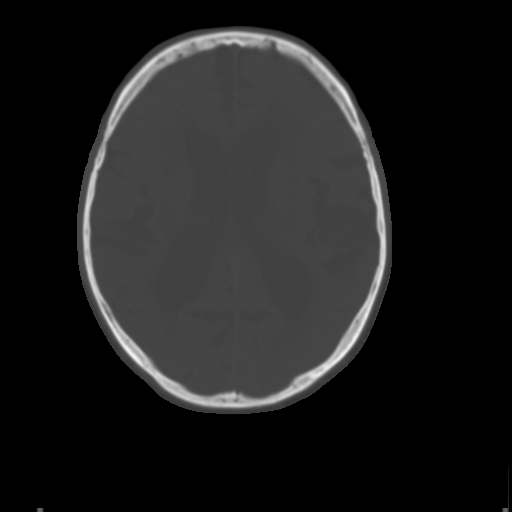
[im 19/31  brain]
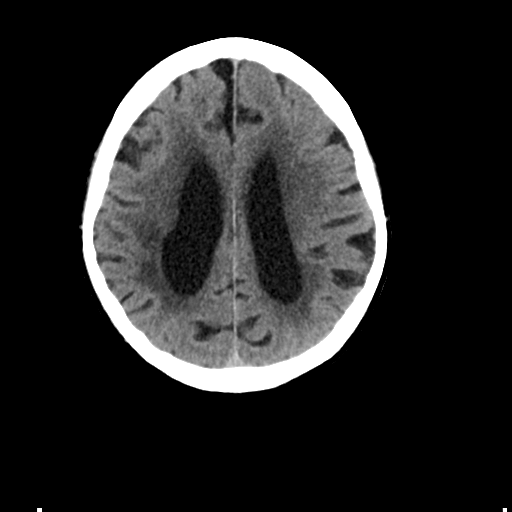
[im 22/31  brain]
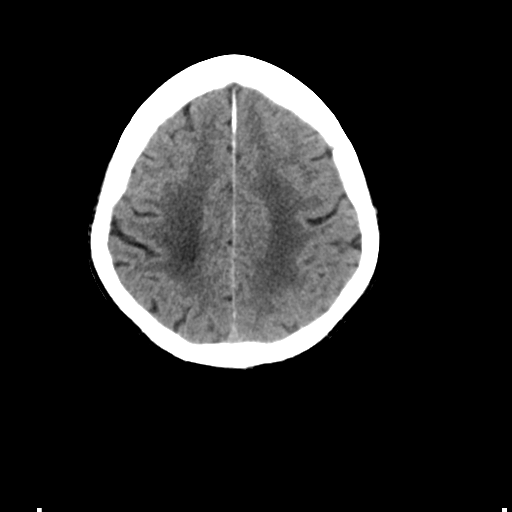
[im 25/31  brain]
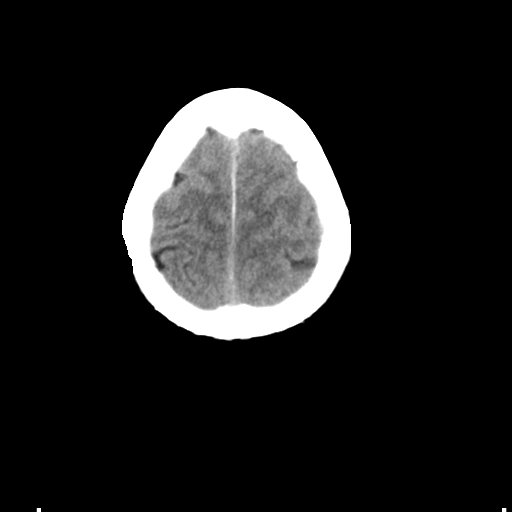
[im 28/31  brain]
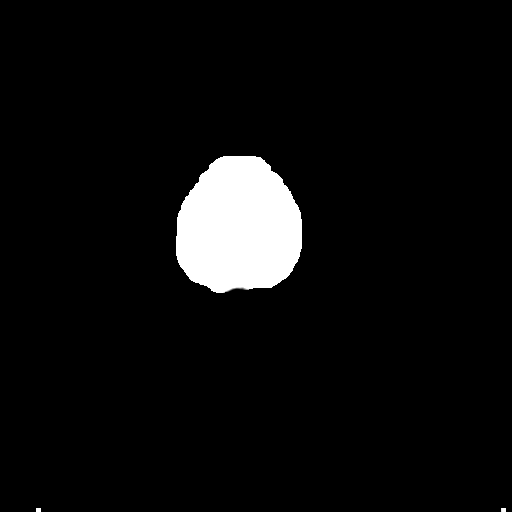
[im 28/31  bone]
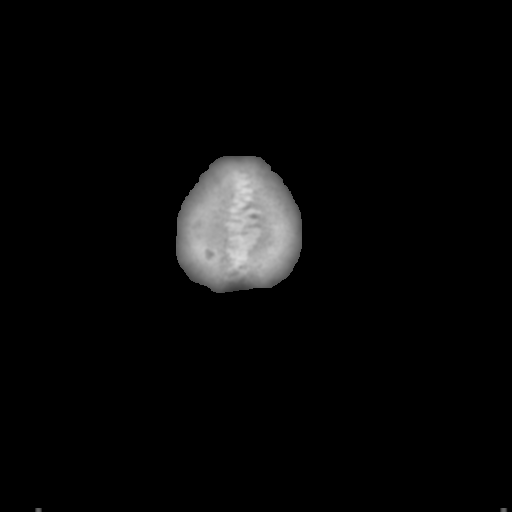

[Series 5: head 3.0 mpr cor · coronal · 0.30mm/px · 3 of 67 slices shown]
[im 23/67  brain]
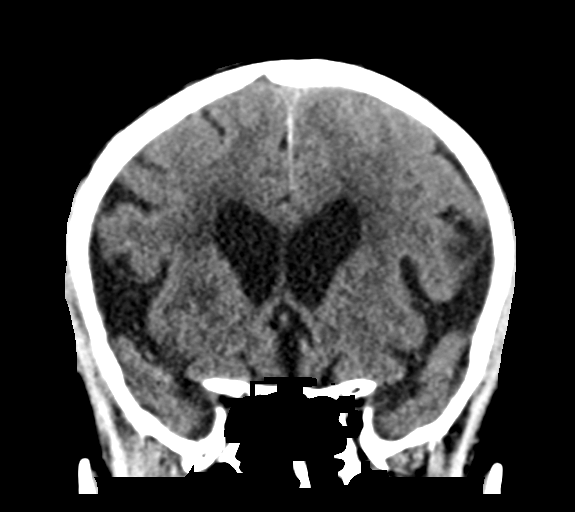
[im 30/67  brain]
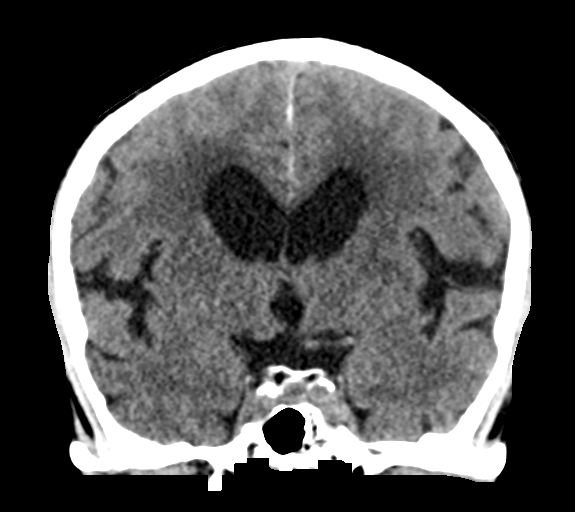
[im 37/67  brain]
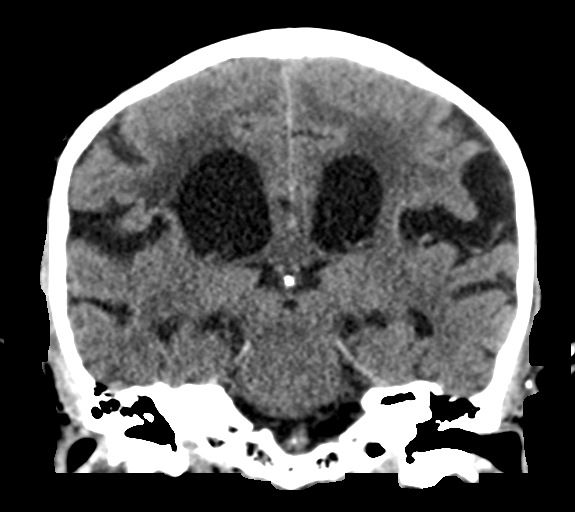

[Series 6: head 3.0 mpr sag · sagittal · 0.31mm/px · 3 of 67 slices shown]
[im 23/67  brain]
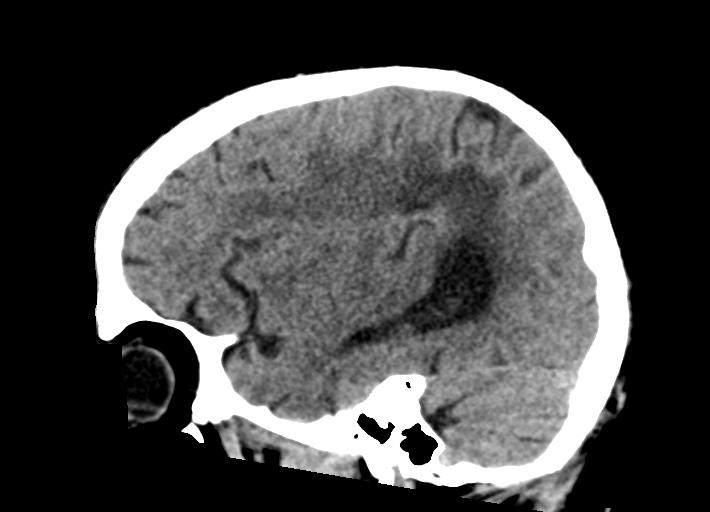
[im 34/67  brain]
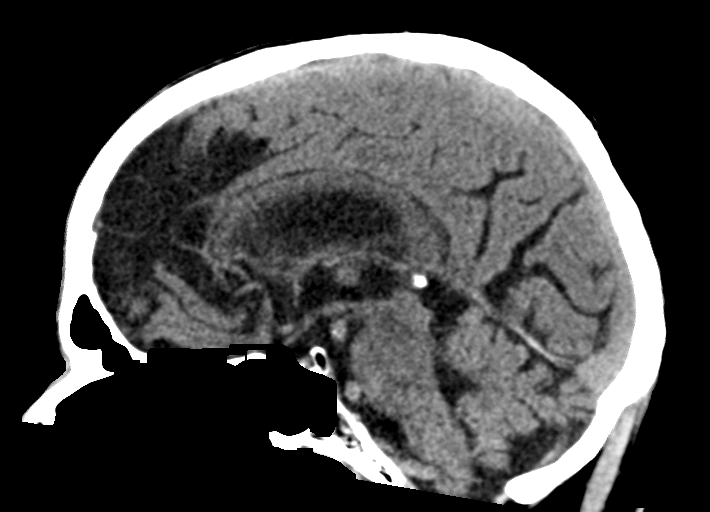
[im 45/67  brain]
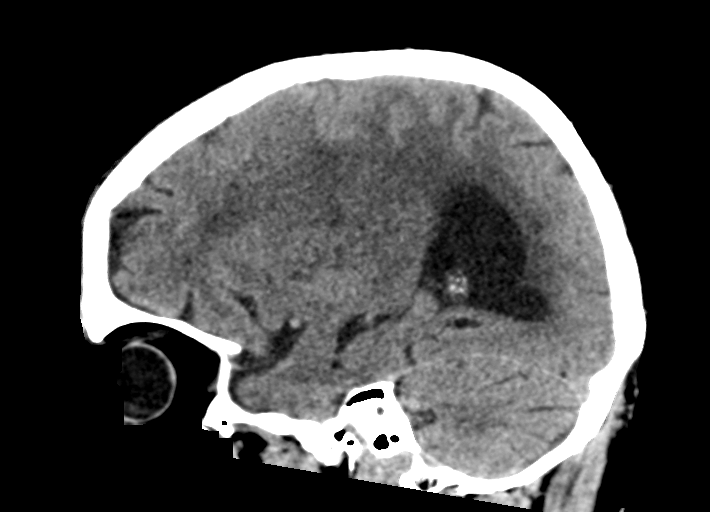

[15 of 47 positions shown; findings below may reference images not displayed]

FINDINGS: Brain: There is no acute intracranial hemorrhage, mass effect, or
edema. No acute appearing loss of gray-white differentiation. Small
chronic infarct of the inferior right occipital lobe. There is no
extra-axial fluid collection. Prominence of the ventricles and sulci
reflects parenchymal volume loss. Patchy and confluent
hypoattenuation in the supratentorial white matter is nonspecific
but may reflect moderate to advanced chronic microvascular ischemic
changes.

Vascular: There is atherosclerotic calcification at the skull base.

Skull: Calvarium is unremarkable.

Sinuses/Orbits: No acute finding.

Other: None.
IMPRESSION: No acute intracranial abnormality. Parenchymal volume loss and
chronic microvascular ischemic changes.

## 2021-09-29 MED ORDER — POTASSIUM CHLORIDE CRYS ER 20 MEQ PO TBCR
40.0000 meq | EXTENDED_RELEASE_TABLET | Freq: Once | ORAL | Status: AC
  Administered 2021-09-29: 40 meq via ORAL
  Filled 2021-09-29: qty 2

## 2021-09-29 NOTE — Consult Note (Signed)
NEURO HOSPITALIST CONSULT NOTE   Requestig physician: Dr. Reather Converse  Reason for Consult: Slurred speech and ataxia  History obtained from:  Patient and Chart     HPI:                                                                                                                                          Clarence Myers is an 88 y.o. male with a PMHx of HTN, HLD, CAD, right shoulder surgery and glaucoma who presented to the ED on Friday afternoon with new onset of right sided weakness, difficulty ambulating and slurred speech since Thursday. Patient recently started acetazolamide for his glaucoma and states that he initially thought that his new symptoms were a side effect. When symptoms persisted despite stopping the medication for one day, the patient and his daughter decided that he should be seen in the ED.   Home medications include ASA and Zocor.   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   He denies a prior history of stroke, although MRI reveals a chronic right parietal lobe ischemic infarction with associated chronic hemorrhagic products.   Past Medical History:  Diagnosis Date   CAD (coronary artery disease)    Hyperlipidemia    Low back strain    Shoulder injury    Fell and injured Right shoulder    Past Surgical History:  Procedure Laterality Date   ANGIOPLASTY     1992-LAD   CARDIAC CATHETERIZATION      No family history on file.           Social History:  reports that he has never smoked. He has never used smokeless tobacco. No history on file for alcohol use and drug use.  Allergies  Allergen Reactions   Blue Crab (Cavinectes Sapidus) Allergy Skin Test     "Hives"    Other     Bee Stings    MEDICATIONS:                                                                                                                     No current facility-administered medications on file prior to encounter.   Current  Outpatient Medications on File Prior to Encounter  Medication Sig Dispense Refill   acetaZOLAMIDE  ER (DIAMOX) 500 MG capsule Take 500 mg by mouth 2 (two) times daily. Only took for 3 days stopped Thursday 09/28/21     aspirin 81 MG tablet Take 81 mg by mouth daily.       LUMIGAN 0.01 % SOLN Place 1 drop into both eyes at bedtime.     metoprolol tartrate (LOPRESSOR) 25 MG tablet Take 12.5 mg by mouth 2 (two) times daily.     Multiple Vitamin (MULTI-VITAMIN PO) Take by mouth daily.       multivitamin-lutein (OCUVITE-LUTEIN) CAPS Take 1 capsule by mouth daily.       Omega-3 Fatty Acids (FISH OIL PO) Take by mouth daily.       omeprazole (PRILOSEC) 20 MG capsule Take 20 mg by mouth daily.     predniSONE (DELTASONE) 5 MG tablet Take 5 mg by mouth daily with breakfast. "Is supposed to take until he sees the doctor again in two weeks, he's been on it for a month"     RESTASIS 0.05 % ophthalmic emulsion Place 1 drop into both eyes 2 (two) times daily.     simvastatin (ZOCOR) 40 MG tablet Take 40 mg by mouth daily.       dorzolamide-timolol (COSOPT) 22.3-6.8 MG/ML ophthalmic solution 1 drop 2 (two) times daily. (Patient not taking: Reported on 09/29/2021)     Travoprost (TRAVATAN OP) Apply to eye.   (Patient not taking: Reported on 09/29/2021)        ROS:                                                                                                                                       No headache, fever, chills, cough, congestion, new vision changes, SOB, CP, abdomianl pain, dysuria or back pain. Other ROS as per HPI.   Blood pressure (!) 149/83, pulse 86, temperature 97.6 F (36.4 C), temperature source Oral, resp. rate 20, height 5\' 3"  (1.6 m), weight 61.8 kg, SpO2 96 %.   General Examination:                                                                                                       Physical Exam  HEENT-  [place]/AT  Lungs- Respirations unlabored Extremities- No edema  Neurological  Examination Mental Status: Awake and alert. Oriented x 5. Speech is dysarthric but fluent with intact naming and comprehension. Good fund of knowledge, memory and insight.  Cranial Nerves: II: Temporal visual fields intact with no extinction to  DSS. PERRL. Decreased visual acuity and visual glare with bright light is stated by patient to be chronic in the context of cataracts.   III,IV, VI: EOMI. No nystagmus. No ptosis.  V: Temp sensation equal bilaterally VII: Smile asymmetric in the context of being partially edentulous; right and left sides of mouth contract equally when smiling.   VIII: Hearing intact to voice IX,X: No hypophonia or hoarseness. Palate elevates equally.  XI: Slight lag on the left with shoulder shrug XII: Midline tongue extension Motor: RUE 5/5 proximally and distally LUE 4/5 proximally and distally RLE 4/5 proximally and distally LLE 5/5 proximally and distally No pronator drift.  Sensory: Temp and light touch sensation equal in BUE and BLE without extinction to DSS.  Deep Tendon Reflexes: 2+ and symmetric throughout Cerebellar: No ataxia with FNF bilaterally.  Gait: Deferred   Lab Results: Basic Metabolic Panel: Recent Labs  Lab 09/29/21 1534 09/29/21 1549  NA 139 141  K 3.4* 3.3*  CL 110 110  CO2 20*  --   GLUCOSE 111* 108*  BUN 23 24*  CREATININE 1.06 1.10  CALCIUM 9.0  --     CBC: Recent Labs  Lab 09/29/21 1534 09/29/21 1549  WBC 9.1  --   NEUTROABS 5.3  --   HGB 12.7* 13.6  HCT 40.1 40.0  MCV 98.0  --   PLT 293  --     Cardiac Enzymes: No results for input(s): CKTOTAL, CKMB, CKMBINDEX, TROPONINI in the last 168 hours.  Lipid Panel: No results for input(s): CHOL, TRIG, HDL, CHOLHDL, VLDL, LDLCALC in the last 168 hours.  Imaging: CT HEAD WO CONTRAST  Result Date: 09/29/2021 CLINICAL DATA:  Neuro deficit, acute, stroke suspected EXAM: CT HEAD WITHOUT CONTRAST TECHNIQUE: Contiguous axial images were obtained from the base of the skull  through the vertex without intravenous contrast. RADIATION DOSE REDUCTION: This exam was performed according to the departmental dose-optimization program which includes automated exposure control, adjustment of the mA and/or kV according to patient size and/or use of iterative reconstruction technique. COMPARISON:  None. FINDINGS: Brain: There is no acute intracranial hemorrhage, mass effect, or edema. No acute appearing loss of gray-white differentiation. Small chronic infarct of the inferior right occipital lobe. There is no extra-axial fluid collection. Prominence of the ventricles and sulci reflects parenchymal volume loss. Patchy and confluent hypoattenuation in the supratentorial white matter is nonspecific but may reflect moderate to advanced chronic microvascular ischemic changes. Vascular: There is atherosclerotic calcification at the skull base. Skull: Calvarium is unremarkable. Sinuses/Orbits: No acute finding. Other: None. IMPRESSION: No acute intracranial abnormality. Parenchymal volume loss and chronic microvascular ischemic changes. Electronically Signed   By: Macy Mis M.D.   On: 09/29/2021 16:21   MR BRAIN WO CONTRAST  Result Date: 09/29/2021 CLINICAL DATA:  Neuro deficit, acute, stroke suspected. Slurred speech. Generalized weakness with multiple falls. EXAM: MRI HEAD WITHOUT CONTRAST TECHNIQUE: Multiplanar, multiecho pulse sequences of the brain and surrounding structures were obtained without intravenous contrast. COMPARISON:  Head CT 09/29/2021 FINDINGS: Brain: Subcentimeter acute infarcts are present in the left paracentral pons and left genu of the corpus callosum. There is encephalomalacia in the right parietal lobe with associated chronic blood products. Patchy and confluent T2 hyperintensities in the cerebral white matter bilaterally are nonspecific but compatible with moderate to severe chronic small vessel ischemic disease. Mild chronic small vessel changes are present in the  pons. Moderate lateral ventriculomegaly is favored to be secondary to cerebral atrophy rather than hydrocephalus. Dilated perivascular spaces  are present throughout the basal ganglia. No mass, midline shift, or extra-axial fluid collection is identified. Vascular: Major intracranial vascular flow voids are preserved. Skull and upper cervical spine: Unremarkable bone marrow signal. Sinuses/Orbits: Bilateral cataract extraction. Small mucous retention cyst in the left maxillary sinus. Clear mastoid air cells. Other: None. IMPRESSION: 1. 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 2. Moderate to severe chronic small vessel ischemic disease and cerebral atrophy. 3. Chronic right parietal hemorrhagic infarct. Electronically Signed   By: Logan Bores M.D.   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  1. Exam reveals unusual crossed findings of upper extremity weakness on the left and lower extremity weakness on the right. Also with dysarthria. The RLE weakness and dysarthria are most likely attributable to the acute left pontine infarction, while the LUE weakness may be secondary to the chronic right parietal lobe ischemic infarction.  2. MRI brain: Subcentimeter acute infarcts are present in the left paracentral pons and left genu of the corpus callosum. There is encephalomalacia in the right parietal lobe with associated chronic blood products. Moderate to severe chronic small vessel ischemic disease and cerebral atrophy are also noted.  3. Etiology for the strokes is felt most likely to be chronic hypertensive microangiopathy.  4. Stroke risk factors: HTN, HLD, CAD  Recommendations: 1. HgbA1c, fasting lipid panel 2. TTE 3. CTA of head and neck 4. Cardiac telemetry 5. PT consult, OT consult, Speech consult 6. Add Plavix to ASA 7. Continue Zocor 8. Risk factor modification 9. Frequent neuro checks 10. NPO until  passes stroke swallow screen   Electronically signed: Dr. Kerney Elbe 09/29/2021, 10:57 PM

## 2021-09-29 NOTE — ED Provider Triage Note (Signed)
Emergency Medicine Provider Triage Evaluation Note  Clarence Myers , a 88 y.o. male  was evaluated in triage.  Pt complains of changes in speech, generalized weakness with multiple falls.  Onset last night.  Recently started new medication from the ophthalmologist and thought it was related to this however when they called the ophthalmologist today was told to go to the ER not likely medication related.  Not on thinners.  Review of Systems  Positive: Changes in speech, weakness Negative: Fever, LOC  Physical Exam  BP (!) 143/76    Pulse 70    Temp 97.6 F (36.4 C) (Oral)    Resp 16    SpO2 99%  Gen:   Awake, no distress   Resp:  Normal effort  MSK:   Moves extremities without difficulty  Other:  Slight facial asymmetry, speech slightly slurred, slight right arm drift.  Medical Decision Making  Medically screening exam initiated at 3:14 PM.  Appropriate orders placed.  Clarence Myers was informed that the remainder of the evaluation will be completed by another provider, this initial triage assessment does not replace that evaluation, and the importance of remaining in the ED until their evaluation is complete.     Tacy Learn, PA-C 09/29/21 1515

## 2021-09-29 NOTE — ED Triage Notes (Signed)
The pt has had weakness  dissiuclty walking and slurred speech since last pm

## 2021-09-29 NOTE — H&P (Signed)
History and Physical    Patient: Clarence Myers ZOX:[id number] DOB: April 22, 1934 DOA: 09/29/2021 DOS: the patient was seen and examined on 09/29/2021 PCP: Mayra Neer, MD  Patient coming from: Home  Chief Complaint:  Chief Complaint  Patient presents with   Weakness    HPI: Clarence Myers is a 88 y.o. male with medical history significant of HTN, CAD, HLD, glaucoma who presents for evaluation of generalized weakness with ataxia and slurred speech.  He reports that he began to have weakness and some ataxia last night and when he woke up this morning he had garbled slurred speech.  He was recently started on acetazolamide for his glaucoma by his ophthalmologist was told that it could cause some continue in his hands and feet so when the initial symptoms started he attributed it to that.  When his speech was slurred this morning the family became more concerned and as it persisted throughout the day despite having stopped the medication 24 hours earlier they decided to come for evaluation.  He did not have any loss of consciousness.  He did have a fall last night and again this morning but had no head trauma and he denies any headache.  He reports he has been off balance and has had generalized weakness when he tries to walk but it cannot attribute to one side or the other.  States he has not had any fever or chills.  He denies any chest pain, palpitations, shortness of breath, urinary symptoms, abdominal pain, nausea vomiting or diarrhea. With his wife.  Denies tobacco, alcohol, illicit drug use.  History [place] has been hemodynamically stable in the emergency room.  He was found to have mild hypokalemia and was given oral potassium supplementation.  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  Hospitalist service was asked to admit for further stroke work-up and neurology was consulted by the ER physician  Review of Systems: As mentioned in  the history of present illness. All other systems reviewed and are negative. Past Medical History:  Diagnosis Date   CAD (coronary artery disease)    Hyperlipidemia    Low back strain    Shoulder injury    Fell and injured Right shoulder   Past Surgical History:  Procedure Laterality Date   ANGIOPLASTY     1992-LAD   CARDIAC CATHETERIZATION     Social History:  reports that he has never smoked. He has never used smokeless tobacco. No history on file for alcohol use and drug use.  Allergies  Allergen Reactions   Blue Crab (Cavinectes Sapidus) Allergy Skin Test     "Hives"    Other     Bee Stings    History reviewed. No pertinent family history.  Prior to Admission medications   Medication Sig Start Date End Date Taking? Authorizing Provider  acetaZOLAMIDE ER (DIAMOX) 500 MG capsule Take 500 mg by mouth 2 (two) times daily. Only took for 3 days stopped Thursday 09/28/21   Yes [provider]  aspirin 81 MG tablet Take 81 mg by mouth daily.     Yes [provider]  LUMIGAN 0.01 % SOLN Place 1 drop into both eyes at bedtime. 03/04/21  Yes [provider]  metoprolol tartrate (LOPRESSOR) 25 MG tablet Take 12.5 mg by mouth 2 (two) times daily. 08/07/21  Yes [provider]  Multiple Vitamin (MULTI-VITAMIN PO) Take by mouth daily.     Yes [provider]  multivitamin-lutein (OCUVITE-LUTEIN) CAPS Take 1 capsule by mouth daily.     Yes [provider]  Omega-3 Fatty Acids (FISH OIL PO) Take by mouth daily.     Yes [provider]  omeprazole (PRILOSEC) 20 MG capsule Take 20 mg by mouth daily. 01/21/21  Yes [provider]  predniSONE (DELTASONE) 5 MG tablet Take 5 mg by mouth daily with breakfast. "Is supposed to take until he sees the doctor again in two weeks, he's been on it for a month" 06/13/21  Yes [provider]  RESTASIS 0.05 % ophthalmic emulsion Place 1 drop into both eyes 2 (two) times daily. 10/25/20   Yes [provider]  simvastatin (ZOCOR) 40 MG tablet Take 40 mg by mouth daily.     Yes [provider]  dorzolamide-timolol (COSOPT) 22.3-6.8 MG/ML ophthalmic solution 1 drop 2 (two) times daily. Patient not taking: Reported on 09/29/2021 03/04/21   [provider]  Travoprost (TRAVATAN OP) Apply to eye.   Patient not taking: Reported on 09/29/2021    [provider]    Physical Exam: Vitals:   09/29/21 1745 09/29/21 1915 09/29/21 2000 09/29/21 2110  BP: 131/74 127/74 136/82 (!) 149/83  Pulse: 63 71 66 86  Resp: (!) 22 (!) 23 19 20   Temp:      TempSrc:      SpO2: 100% 99% 97% 96%  Weight:      Height:       General: WDWN, Alert and oriented x3.  Eyes: EOMI, PERRL, conjunctivae normal.  Sclera nonicteric HENT:  [place]/AT, external ears normal.  Nares patent without epistasis. Mucous membranes are moist. Posterior pharynx clear of any exudate   Neck: Soft, normal range of motion, supple, no masses, no thyromegaly. Trachea midline Respiratory: clear to auscultation bilaterally, no wheezing, no crackles. Normal respiratory effort. No accessory muscle use.  Cardiovascular: Regular rate and rhythm, no murmurs / rubs / gallops. No extremity edema. 2+ pedal pulses. No carotid bruits.  Abdomen: Soft, no tenderness, nondistended, no rebound or guarding.  No masses palpated. Bowel sounds normoactive Musculoskeletal: FROM. no cyanosis. No joint deformity upper and lower extremities. Normal muscle tone.  Skin: Warm, dry, intact no rashes, lesions, ulcers. No induration Neurologic: CN 2-12 grossly intact. Speech slurred.  Sensation intact, patella DTR +1 bilaterally. Strength 5/5 in all extremities.  No pronator drift. No tremor. Normal heel to shin Psychiatric: Normal judgment and insight.  Normal mood.    Data Reviewed: Lab work shows unremarkable CBC.  Sodium 139 potassium 3.4 chloride 110 bicarb 20 creatinine 1.06 BUN 23 glucose 111 LFTs normal calcium 9.0 INR  1.0 COVID-negative.  Influenza A and B negative  EKG shows normal sinus rhythm with no acute ST elevation or depression.  QTc 414  Assessment and Plan: * CVA (cerebral vascular accident) ([place])- (present on admission) Mr. Stairs admitted to medical telemetry floor for TIA/CVA.  Will obtain MRI of brain to rule out acute ischemic CVA.  Will evaluate carotids for large vessel occlusion/stenosis. Obtain echocardiogram to evaluate for PFO, wall motion and ejection fraction. Hypertension of 220/110 will be allowed for 24 hours per stroke protocol.  After which blood pressure will be slowly reduced to goal level. Antiplatelet therapy with aspirin daily. Continue statin therapy.  Check lipid panel.  Consult ST/PT in am Neurochecks per stroke protocol Neurology consulted by ER physician   Essential hypertension Permissive Hypertension overnight and will resume metoprolol in am. Monitor BP  CAD (coronary artery disease)- (present on admission)  Continue statin. Metoprolol will be resumed after permissive hypertension period ends  Hypokalemia Was given po potassium supplementation in Er.  Magnesium level ordered and pending and will treat if low Check electrolytes, renal function in am  Hyperlipidemia- (present on admission) Continue statin   Advance Care Planning:  Code status:  Full code;  DVT prophylaxis-SCDs  Consults: Neurology  Family Communication: Diagnosis and plan discussed with patient and his daughter is at bedside.  They both verbalized understanding and agree with plan.  Further recommendations to follow as clinical indicated   Author: Eben Burow, MD 09/29/2021 11:32 PM  For on call review [email or web address].

## 2021-09-29 NOTE — Assessment & Plan Note (Addendum)
Permissive Hypertension with slow normalization of pressures.  Monitor BP. Blood pressures today are normotensive. Have not restarted antihypertensives for this reason.

## 2021-09-29 NOTE — Assessment & Plan Note (Signed)
Continue statin. 

## 2021-09-29 NOTE — Assessment & Plan Note (Addendum)
Supplement and monitor potassium as necessary.

## 2021-09-29 NOTE — ED Notes (Signed)
Patient transported to MRI 

## 2021-09-29 NOTE — ED Provider Notes (Signed)
[facility] EMERGENCY DEPARTMENT Provider Note   CSN: [id number] Arrival date & time: 09/29/21  1506     History  Chief Complaint  Patient presents with   Weakness    Clarence Myers is a 88 y.o. male.  Patient presents with general weakness, ataxia and slurred speech since yesterday evening.  Patient and daughter felt initially it was secondary to acetazolamide though started for glaucoma.  When symptoms persisted despite stopping medicine 24 hours ago they became concerned.  No unilateral findings.  No headache fever or infectious symptoms.  No history of stroke.      Home Medications Prior to Admission medications   Medication Sig Start Date End Date Taking? Authorizing Provider  aspirin 81 MG tablet Take 81 mg by mouth daily.      [provider]  dorzolamide-timolol (COSOPT) 22.3-6.8 MG/ML ophthalmic solution 1 drop 2 (two) times daily. 03/04/21   [provider]  LUMIGAN 0.01 % SOLN SMARTSIG:1 Drop(s) In Eye(s) Every Evening 03/04/21   [provider]  Multiple Vitamin (MULTI-VITAMIN PO) Take by mouth daily.      [provider]  multivitamin-lutein (OCUVITE-LUTEIN) CAPS Take 1 capsule by mouth daily.      [provider]  Omega-3 Fatty Acids (FISH OIL PO) Take by mouth daily.      [provider]  omeprazole (PRILOSEC) 20 MG capsule Take 20 mg by mouth daily. 01/21/21   [provider]  RESTASIS 0.05 % ophthalmic emulsion 1 drop 2 (two) times daily. 10/25/20   [provider]  simvastatin (ZOCOR) 40 MG tablet Take 40 mg by mouth daily.      [provider]  Travoprost (TRAVATAN OP) Apply to eye.      [provider]      Allergies    Other    Review of Systems   Review of Systems  Constitutional:  Negative for chills and fever.  HENT:  Negative for congestion.   Eyes:  Negative for visual disturbance.  Respiratory:  Negative for shortness of breath.    Cardiovascular:  Negative for chest pain.  Gastrointestinal:  Negative for abdominal pain and vomiting.  Genitourinary:  Negative for dysuria and flank pain.  Musculoskeletal:  Negative for back pain, neck pain and neck stiffness.  Skin:  Negative for rash.  Neurological:  Positive for speech difficulty and weakness. Negative for seizures, syncope, light-headedness and headaches.   Physical Exam Updated Vital Signs BP (!) 149/83    Pulse 86    Temp 97.6 F (36.4 C) (Oral)    Resp 20    Ht 5\' 3"  (1.6 m)    Wt 61.8 kg    SpO2 96%    BMI 24.13 kg/m  Physical Exam Vitals and nursing note reviewed.  Constitutional:      General: He is not in acute distress.    Appearance: He is well-developed.  HENT:     Head: Normocephalic and atraumatic.     Mouth/Throat:     Mouth: Mucous membranes are moist.  Eyes:     General:        Right eye: No discharge.        Left eye: No discharge.     Conjunctiva/sclera: Conjunctivae normal.  Neck:     Trachea: No tracheal deviation.  Cardiovascular:     Rate and Rhythm: Normal rate and regular rhythm.  Pulmonary:     Effort: Pulmonary effort is normal.     Breath sounds:  Normal breath sounds.  Abdominal:     General: There is no distension.     Palpations: Abdomen is soft.     Tenderness: There is no abdominal tenderness. There is no guarding.  Musculoskeletal:     Cervical back: Normal range of motion and neck supple. No rigidity.  Skin:    General: Skin is warm.     Capillary Refill: Capillary refill takes less than 2 seconds.     Findings: No rash.  Neurological:     Mental Status: He is alert.     GCS: GCS eye subscore is 4. GCS verbal subscore is 5. GCS motor subscore is 6.     Comments: Patient has mild dysmetria left finger-nose, normal right finger-nose.  Visual fields intact.  Pupils equal extraocular muscle function intact.  Patient slurred speech on exam.  No focal weakness, normal strength upper and lower extremity and sensation  palpation intact bilateral.  Psychiatric:        Mood and Affect: Mood normal.    ED Results / Procedures / Treatments   Labs (all labs ordered are listed, but only abnormal results are displayed) Labs Reviewed  CBC - Abnormal; Notable for the following components:      Result Value   RBC 4.09 (*)    Hemoglobin 12.7 (*)    All other components within normal limits  COMPREHENSIVE METABOLIC PANEL - Abnormal; Notable for the following components:   Potassium 3.4 (*)    CO2 20 (*)    Glucose, Bld 111 (*)    All other components within normal limits  I-STAT CHEM 8, ED - Abnormal; Notable for the following components:   Potassium 3.3 (*)    BUN 24 (*)    Glucose, Bld 108 (*)    TCO2 20 (*)    All other components within normal limits  RESP PANEL BY RT-PCR (FLU A&B, COVID) ARPGX2  ETHANOL  PROTIME-INR  APTT  DIFFERENTIAL  RAPID URINE DRUG SCREEN, HOSP PERFORMED  URINALYSIS, ROUTINE W REFLEX MICROSCOPIC  MAGNESIUM    EKG EKG Interpretation  Date/Time:  Friday September 29 2021 15:15:33 EST Ventricular Rate:  70 PR Interval:  166 QRS Duration: 88 QT Interval:  384 QTC Calculation: 414 R Axis:   -8 Text Interpretation: Normal sinus rhythm Minimal voltage criteria for LVH, may be normal variant ( R in aVL ) Borderline ECG When compared with ECG of 03-Sep-2007 08:15, PREVIOUS ECG IS PRESENT Confirmed by Elnora Morrison [id number]) on 09/29/2021 4:46:57 PM  Radiology CT HEAD WO CONTRAST  Result Date: 09/29/2021 CLINICAL DATA:  Neuro deficit, acute, stroke suspected EXAM: CT HEAD WITHOUT CONTRAST TECHNIQUE: Contiguous axial images were obtained from the base of the skull through the vertex without intravenous contrast. RADIATION DOSE REDUCTION: This exam was performed according to the departmental dose-optimization program which includes automated exposure control, adjustment of the mA and/or kV according to patient size and/or use of iterative reconstruction technique. COMPARISON:   None. FINDINGS: Brain: There is no acute intracranial hemorrhage, mass effect, or edema. No acute appearing loss of gray-white differentiation. Small chronic infarct of the inferior right occipital lobe. There is no extra-axial fluid collection. Prominence of the ventricles and sulci reflects parenchymal volume loss. Patchy and confluent hypoattenuation in the supratentorial white matter is nonspecific but may reflect moderate to advanced chronic microvascular ischemic changes. Vascular: There is atherosclerotic calcification at the skull base. Skull: Calvarium is unremarkable. Sinuses/Orbits: No acute finding. Other: None. IMPRESSION: No acute intracranial abnormality. Parenchymal volume loss  and chronic microvascular ischemic changes. Electronically Signed   By: Macy Mis M.D.   On: 09/29/2021 16:21   MR BRAIN WO CONTRAST  Result Date: 09/29/2021 CLINICAL DATA:  Neuro deficit, acute, stroke suspected. Slurred speech. Generalized weakness with multiple falls. EXAM: MRI HEAD WITHOUT CONTRAST TECHNIQUE: Multiplanar, multiecho pulse sequences of the brain and surrounding structures were obtained without intravenous contrast. COMPARISON:  Head CT 09/29/2021 FINDINGS: Brain: Subcentimeter acute infarcts are present in the left paracentral pons and left genu of the corpus callosum. There is encephalomalacia in the right parietal lobe with associated chronic blood products. Patchy and confluent T2 hyperintensities in the cerebral white matter bilaterally are nonspecific but compatible with moderate to severe chronic small vessel ischemic disease. Mild chronic small vessel changes are present in the pons. Moderate lateral ventriculomegaly is favored to be secondary to cerebral atrophy rather than hydrocephalus. Dilated perivascular spaces are present throughout the basal ganglia. No mass, midline shift, or extra-axial fluid collection is identified. Vascular: Major intracranial vascular flow voids are preserved.  Skull and upper cervical spine: Unremarkable bone marrow signal. Sinuses/Orbits: Bilateral cataract extraction. Small mucous retention cyst in the left maxillary sinus. Clear mastoid air cells. Other: None. IMPRESSION: 1. 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 2. Moderate to severe chronic small vessel ischemic disease and cerebral atrophy. 3. Chronic right parietal hemorrhagic infarct. Electronically Signed   By: Logan Bores M.D.   On: 09/29/2021 21:13    Procedures Procedures    Medications Ordered in ED Medications  potassium chloride SA (KLOR-CON M) CR tablet 40 mEq (40 mEq Oral Given 09/29/21 1758)    ED Course/ Medical Decision Making/ A&P                           Medical Decision Making Amount and/or Complexity of Data Reviewed Labs: ordered. Radiology: ordered.  Risk Prescription drug management. Decision regarding hospitalization.   Patient presents with slurred speech and ataxia differential including stroke, medication side effect, head bleed, electrolyte/metabolic, other.  Patient has persistent slurred speech on exam and with patient being off medication for 24 hours unlikely secondary.  Discussed with neurology who agreed with MRI and if positive to call them for admission.  Patient observed no worsening neuro symptoms, overall well-appearing on repeat examination with mild slurred speech.  MRI results reviewed showing new stroke.  Discussed with neurology who will see the patient.  Discussed with medicine for admission.  Blood work ordered reviewed showing normal hemoglobin complex lites unremarkable except for mild hypokalemia 3.4.  Patient stable for admission.        Final Clinical Impression(s) / ED Diagnoses Final diagnoses:  Slurred speech  Ataxia  Acute CVA (cerebrovascular accident) [facility])    Rx / Joaquin Orders ED Discharge Orders     None         Elnora Morrison, MD 09/29/21 2258

## 2021-09-29 NOTE — Assessment & Plan Note (Addendum)
[name] admitted to medical telemetry floor for TIA/CVA.  MRI has confirmed the presence of small acute infarcts in the corpus callosum and pons. Will evaluate carotids for large vessel occlusion/stenosis. Echocardiogram demonstrates LVEF of 65-70%. Normal function, no regional wall motion abnormalities. There is Grade I diastolic dysfunction. RV systolic function is normal. PASP is normal. There is mild to moderate aortic valve stenosis. Hypertension will gradually be normalized over 4-5 days as recommended by Dr. Tilden Dome. Antiplatelet therapy with aspirin daily. Continue statin therapy.  Check lipid panel.  Consult ST/PT in am. The patient has been evaluated by PT/OT. The recommendation has been made for CIR. Rehab has been consulted to evaluated.   On 10/02/2021 the patient has lost the use of his right upper extremity. Stat CT head was obtained. The patient was seen by Dr. Tilden Dome. Will continue to monitor.  I appreciate neurology assistance. He has remained a little function this morning.

## 2021-09-29 NOTE — Assessment & Plan Note (Addendum)
Continue statin. The patient is normotensive without antihypertensives.

## 2021-09-30 ENCOUNTER — Inpatient Hospital Stay (HOSPITAL_COMMUNITY): Payer: PPO

## 2021-09-30 ENCOUNTER — Encounter (HOSPITAL_COMMUNITY): Payer: PPO

## 2021-09-30 DIAGNOSIS — E876 Hypokalemia: Secondary | ICD-10-CM

## 2021-09-30 DIAGNOSIS — I2510 Atherosclerotic heart disease of native coronary artery without angina pectoris: Secondary | ICD-10-CM

## 2021-09-30 DIAGNOSIS — E782 Mixed hyperlipidemia: Secondary | ICD-10-CM

## 2021-09-30 DIAGNOSIS — R4781 Slurred speech: Secondary | ICD-10-CM | POA: Diagnosis present

## 2021-09-30 DIAGNOSIS — I10 Essential (primary) hypertension: Secondary | ICD-10-CM

## 2021-09-30 LAB — LIPID PANEL
Cholesterol: 153 mg/dL (ref 0–200)
HDL: 58 mg/dL (ref >40)
LDL Cholesterol: 64 mg/dL (ref 0–99)
Total CHOL/HDL Ratio: 2.6 RATIO
Triglycerides: 153 mg/dL — ABNORMAL HIGH (ref <150)
VLDL: 31 mg/dL (ref 0–40)

## 2021-09-30 LAB — HEMOGLOBIN A1C
Hgb A1c MFr Bld: 5.8 % — ABNORMAL HIGH (ref 4.8–5.6)
Mean Plasma Glucose: 119.76 mg/dL

## 2021-09-30 LAB — MAGNESIUM: Magnesium: 2.2 mg/dL (ref 1.7–2.4)

## 2021-09-30 IMAGING — CT CT ANGIO HEAD
2 of 7 series · 8 of 33 positions shown · IV contrast (APPLIED)
Comparison: Brain MRI from yesterday

CLINICAL DATA: Stroke follow-up

EXAM:
CT ANGIOGRAPHY HEAD AND NECK
TECHNIQUE: Multidetector CT imaging of the head and neck was performed using
the standard protocol during bolus administration of intravenous
contrast. Multiplanar CT image reconstructions and MIPs were
obtained to evaluate the vascular anatomy. Carotid stenosis
measurements (when applicable) are obtained utilizing NASCET
criteria, using the distal internal carotid diameter as the
denominator.

[Series 6: cta neck/head · axial · 0.57mm/px · z∈[-171,-59]mm · 2 of 169 slices shown]
[im 57/169  soft-tissue]
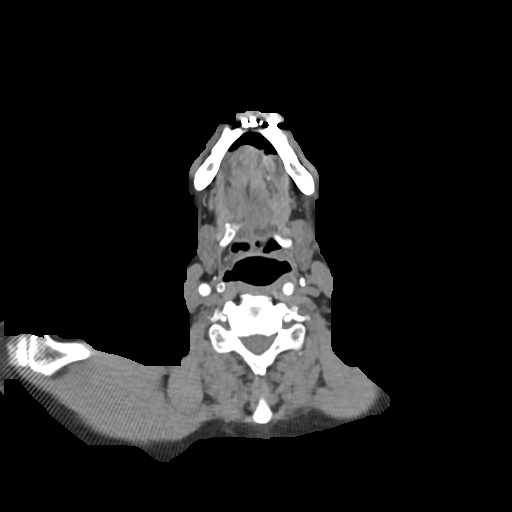
[im 113/169  soft-tissue]
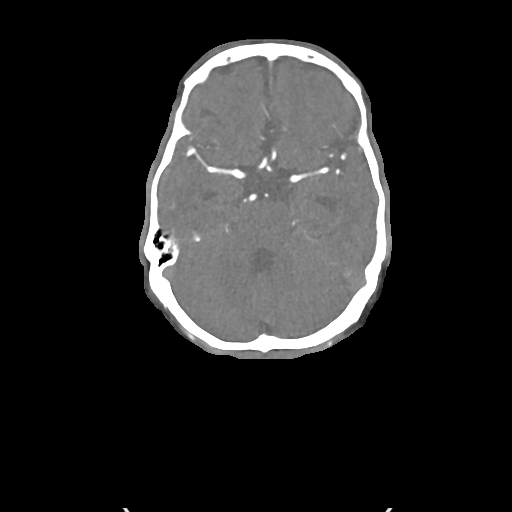

[Series 8: ax thins · axial · 0.47mm/px · z∈[-236,+4]mm · 6 of 338 slices shown]
[im 49/338  soft-tissue]
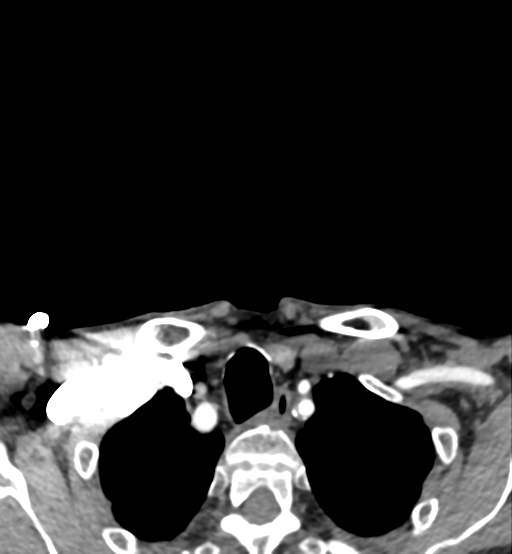
[im 97/338  bone]
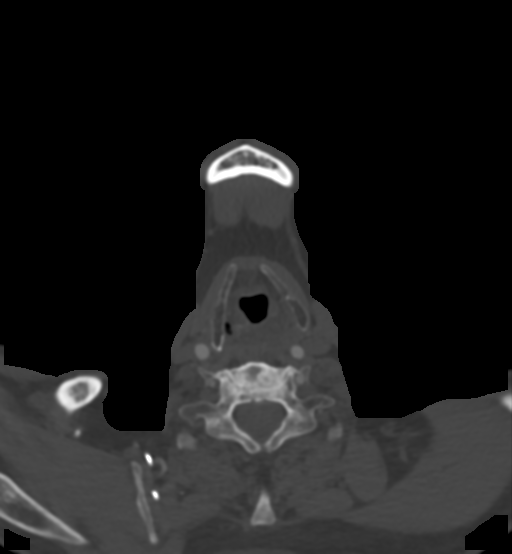
[im 145/338  soft-tissue]
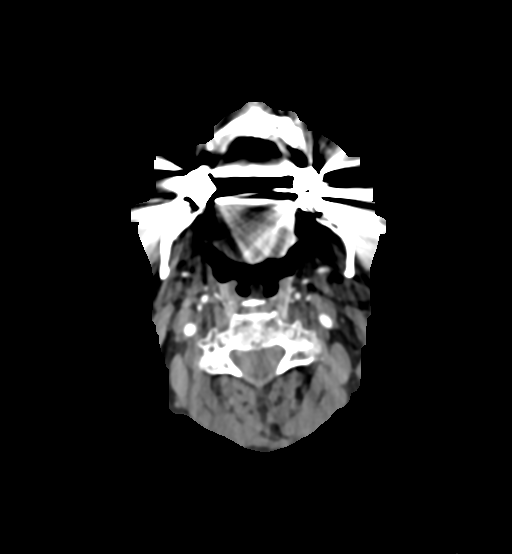
[im 193/338  bone]
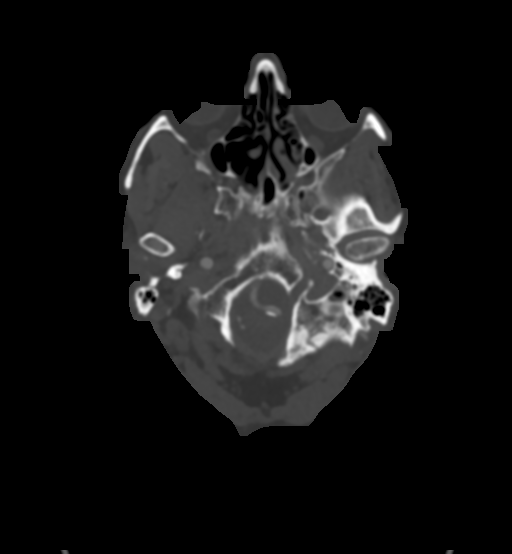
[im 241/338  soft-tissue]
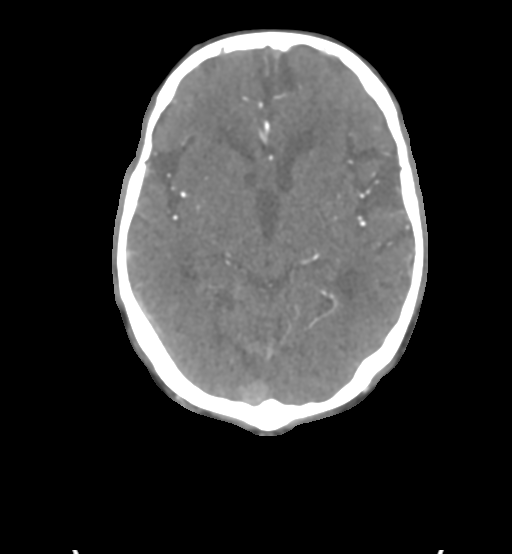
[im 289/338  bone]
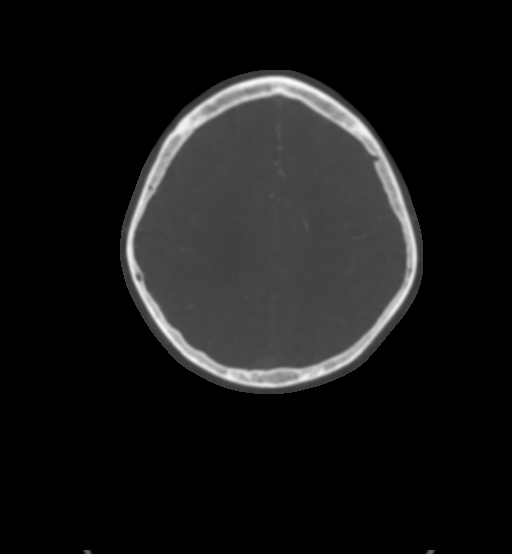

[8 of 33 positions shown; findings below may reference images not displayed]

RADIATION DOSE REDUCTION: This exam was performed according to the
departmental dose-optimization program which includes automated
exposure control, adjustment of the mA and/or kV according to
patient size and/or use of iterative reconstruction technique.

CONTRAST:  70mL OMNIPAQUE IOHEXOL 350 MG/ML SOLN
FINDINGS: CTA NECK FINDINGS

Aortic arch: Brachiocephalic origin is not covered. Mild
atheromatous wall thickening.

Right carotid system: Moderate mainly calcified plaque at the
bifurcation without flow limiting stenosis or ulceration.

Left carotid system: Mild to moderate calcified plaque at the
bifurcation without flow limiting stenosis or ulceration.

Vertebral arteries: Proximal subclavian atherosclerosis at the left
origin. Calcified plaque at both vertebral ostia with only mild
luminal narrowing. The left vertebral artery is dominant. Both
vertebral arteries are smooth and patent to the dura.

Skeleton: No acute or aggressive finding.

Other neck: No acute or aggressive finding

Upper chest: Negative

Review of the MIP images confirms the above findings

CTA HEAD FINDINGS

Anterior circulation: Atheromatous plaque along the carotid siphons
with mild narrowing at the right paraclinoid segment. No branch
occlusion, beading, or aneurysm.

Posterior circulation: Left dominant vertebral artery. Early
branching right PICA at or just before the dura. Calcified plaque on
both V4 segments. Severe proximal right PCA stenosis with downstream
attenuation. No acute infarct in this distribution by preceding MRI,
presumably chronic

Venous sinuses: Unremarkable in the arterial phase

Anatomic variants: None significant

Review of the MIP images confirms the above findings
IMPRESSION: 1. Severe right proximal PCA stenosis with downstream underfilling.
No acute infarct in this distribution by preceding MRI.
2. Otherwise, no flow limiting stenosis at sites of cervical and
intracranial atherosclerosis.

## 2021-09-30 MED ORDER — LATANOPROST 0.005 % OP SOLN
1.0000 [drp] | Freq: Every day | OPHTHALMIC | Status: DC
  Administered 2021-09-30 – 2021-10-09 (×10): 1 [drp] via OPHTHALMIC
  Filled 2021-09-30 (×2): qty 2.5

## 2021-09-30 MED ORDER — CLOPIDOGREL BISULFATE 75 MG PO TABS
75.0000 mg | ORAL_TABLET | Freq: Every day | ORAL | Status: DC
  Administered 2021-09-30 – 2021-10-10 (×11): 75 mg via ORAL
  Filled 2021-09-30 (×11): qty 1

## 2021-09-30 MED ORDER — ASPIRIN EC 81 MG PO TBEC
81.0000 mg | DELAYED_RELEASE_TABLET | Freq: Every day | ORAL | Status: DC
  Administered 2021-09-30 – 2021-10-10 (×11): 81 mg via ORAL
  Filled 2021-09-30 (×11): qty 1

## 2021-09-30 MED ORDER — CYCLOSPORINE 0.05 % OP EMUL
1.0000 [drp] | Freq: Two times a day (BID) | OPHTHALMIC | Status: DC
  Administered 2021-09-30 – 2021-10-10 (×21): 1 [drp] via OPHTHALMIC
  Filled 2021-09-30 (×22): qty 30

## 2021-09-30 MED ORDER — PANTOPRAZOLE SODIUM 40 MG PO TBEC
40.0000 mg | DELAYED_RELEASE_TABLET | Freq: Every day | ORAL | Status: DC
  Administered 2021-09-30 – 2021-10-10 (×11): 40 mg via ORAL
  Filled 2021-09-30 (×11): qty 1

## 2021-09-30 MED ORDER — SIMVASTATIN 20 MG PO TABS
40.0000 mg | ORAL_TABLET | Freq: Every day | ORAL | Status: DC
  Administered 2021-09-30 – 2021-10-10 (×11): 40 mg via ORAL
  Filled 2021-09-30 (×11): qty 2

## 2021-09-30 MED ORDER — IOHEXOL 350 MG/ML SOLN
70.0000 mL | Freq: Once | INTRAVENOUS | Status: AC | PRN
  Administered 2021-09-30: 70 mL via INTRAVENOUS

## 2021-09-30 MED ORDER — ACETAMINOPHEN 160 MG/5ML PO SOLN
650.0000 mg | ORAL | Status: DC | PRN
  Administered 2021-10-01: 650 mg
  Filled 2021-09-30: qty 20.3

## 2021-09-30 MED ORDER — SENNOSIDES-DOCUSATE SODIUM 8.6-50 MG PO TABS: 1.0000 | ORAL_TABLET | Freq: Every evening | ORAL | Status: DC | PRN

## 2021-09-30 MED ORDER — OMEGA-3-ACID ETHYL ESTERS 1 G PO CAPS
1.0000 g | ORAL_CAPSULE | Freq: Every day | ORAL | Status: DC
  Administered 2021-09-30 – 2021-10-10 (×11): 1 g via ORAL
  Filled 2021-09-30 (×11): qty 1

## 2021-09-30 MED ORDER — SODIUM CHLORIDE 0.9 % IV SOLN: INTRAVENOUS | Status: DC

## 2021-09-30 MED ORDER — ACETAMINOPHEN 650 MG RE SUPP: 650.0000 mg | RECTAL | Status: DC | PRN

## 2021-09-30 MED ORDER — PROSIGHT PO TABS
1.0000 | ORAL_TABLET | Freq: Every day | ORAL | Status: DC
  Administered 2021-09-30 – 2021-10-10 (×11): 1 via ORAL
  Filled 2021-09-30 (×11): qty 1

## 2021-09-30 MED ORDER — STROKE: EARLY STAGES OF RECOVERY BOOK
Freq: Once | Status: AC
  Filled 2021-09-30: qty 1

## 2021-09-30 MED ORDER — ACETAMINOPHEN 325 MG PO TABS
650.0000 mg | ORAL_TABLET | ORAL | Status: DC | PRN
  Administered 2021-10-03 – 2021-10-10 (×5): 650 mg via ORAL
  Filled 2021-09-30 (×5): qty 2

## 2021-09-30 NOTE — Evaluation (Signed)
Occupational Therapy Evaluation Patient Details Name: Clarence Myers MRN: [id number] DOB: [birth year] Today's Date: 09/30/2021   History of Present Illness Patient is a 88 y/o male who presents on 09/29/21 with slurred speech, weakness and difficulty walking; s/p 2 falls at home. 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 Found to have hypokalemia as well. PMH includes CAD, low back strain.   Clinical Impression   Clarence Myers was evaluated s/p the above CVA. He reports being generally mod I PTA with use of a SPC, he does not drive and was active with OPPT for balance and pain management. He lives in a 1 level home with his wife who is unable to provide physical assistance, and he has 2 supportive daughters near by who can assist as needed. Overall he required mod A for bed mobility and transfers due to RLE weakness, impaired coordination, difficulty sequencing and posterior leaning. He also requires min A for sitting ADLs and mod A for OOB ADLs. Pt presents with slurred speech and impaired cognition. Spent increased time this session reviewing rehab options with pt and family, as well as educating family on BE FAST. Pt will benefit from OT acutely to address the impairments listed below. Recommend d/c to AIR to progress pt to his mod I baseline.      Recommendations for follow up therapy are one component of a multi-disciplinary discharge planning process, led by the attending physician.  Recommendations may be updated based on patient status, additional functional criteria and insurance authorization.   Follow Up Recommendations  Acute inpatient rehab (3hours/day)    Assistance Recommended at Discharge Frequent or constant Supervision/Assistance  Patient can return home with the following A lot of help with walking and/or transfers;A lot of help with bathing/dressing/bathroom;Direct supervision/assist for medications management;Help with stairs or ramp for entrance;Assist for  transportation    Functional Status Assessment  Patient has had a recent decline in their functional status and demonstrates the ability to make significant improvements in function in a reasonable and predictable amount of time.  Equipment Recommendations  None recommended by OT    Recommendations for Other Services Rehab consult     Precautions / Restrictions Precautions Precautions: Fall Precaution Comments: hx of 2 recent falls Restrictions Weight Bearing Restrictions: No      Mobility Bed Mobility Overal bed mobility: Needs Assistance Bed Mobility: Supine to Sit     Supine to sit: Mod assist     General bed mobility comments: assist to bring hips toward EOB and to elevate trunk    Transfers Overall transfer level: Needs assistance Equipment used: Rolling walker (2 wheels) Transfers: Sit to/from Stand, Bed to chair/wheelchair/BSC Sit to Stand: Min assist     Step pivot transfers: Mod assist     General transfer comment: min-mod throughout for balance, RLE weakness and safety. cues for placement      Balance Overall balance assessment: Needs assistance, History of Falls Sitting-balance support: Feet supported, Bilateral upper extremity supported Sitting balance-Leahy Scale: Poor Sitting balance - Comments: posterior lean, requierd physical assist the majority of the time in sitting Postural control: Posterior lean Standing balance support: Bilateral upper extremity supported Standing balance-Leahy Scale: Poor Standing balance comment: required external support                 ADL either performed or assessed with clinical judgement   ADL Overall ADL's : Needs assistance/impaired Eating/Feeding: Independent;Sitting   Grooming: Minimal assistance;Standing   Upper Body Bathing: Minimal assistance;Sitting   Lower Body Bathing:  Moderate assistance;Sit to/from stand   Upper Body Dressing : Minimal assistance;Sitting   Lower Body Dressing:  Moderate assistance;Sit to/from stand   Toilet Transfer: Moderate assistance;Ambulation;Rolling walker (2 wheels)   Toileting- Clothing Manipulation and Hygiene: Minimal assistance;Sit to/from stand       Functional mobility during ADLs: Moderate assistance;Rolling walker (2 wheels) General ADL Comments: pt with posterior lean in sitting. requires cues and physical assist for all ADLs. impaired co     Vision Baseline Vision/History: 1 Wears glasses Patient Visual Report: No change from baseline Vision Assessment?: No apparent visual deficits Additional Comments: difficulty with multistep command following for vision assessment            Pertinent Vitals/Pain Pain Assessment Pain Assessment: No/denies pain     Hand Dominance Right   Extremity/Trunk Assessment Upper Extremity Assessment Upper Extremity Assessment: Generalized weakness;RUE deficits/detail;LUE deficits/detail RUE Deficits / Details: grossly 4/5, slow and deliberate coordination RUE Coordination: decreased fine motor LUE Deficits / Details: grossly 4/5, slow and deliberate coordination LUE Sensation: WNL LUE Coordination: decreased fine motor   Lower Extremity Assessment Lower Extremity Assessment: Defer to PT evaluation   Cervical / Trunk Assessment Cervical / Trunk Assessment: Kyphotic   Communication Communication Communication: Expressive difficulties   Cognition Arousal/Alertness: Awake/alert Behavior During Therapy: WFL for tasks assessed/performed Overall Cognitive Status: Impaired/Different from baseline Area of Impairment: Attention, Memory, Following commands, Safety/judgement, Problem solving         Current Attention Level: Selective Memory: Decreased short-term memory Following Commands: Follows one step commands with increased time Safety/Judgement: Decreased awareness of safety, Decreased awareness of deficits   Problem Solving: Slow processing, Difficulty sequencing, Requires  verbal cues General Comments: A&Ox4. Poor STM, had difficulty remembering events of this a.m. and PLOF. Required incrased cues for safety and sequencing. Daughters present & agree this is not his baseline cognition. Pt also with slurred speech and difficulty word finding.     General Comments  VSS on RA, daughters present and supportive. Increased time spent explaining rehab and [facility] education            Home Living Family/patient expects to be discharged to:: Private residence Living Arrangements: Spouse/significant other Available Help at Discharge: Family;Available 24 hours/day Type of Home: House Home Access: Stairs to enter [employer] of Steps: 2-3 Entrance Stairs-Rails: None Home Layout: Two level;Able to live on main level with bedroom/bathroom Alternate Level Stairs-Number of Steps: 1 flight   Bathroom Shower/Tub: Walk-in shower   Bathroom Toilet: Handicapped height     Home Equipment: Shower seat;Cane - single point;Rolling Environmental consultant (2 wheels)   Additional Comments: wife is unwell, unable to physically assist pt.      Prior Functioning/Environment Prior Level of Function : Needs assist             Mobility Comments: Uses RW for ambulation. Active wtih OPPT ADLs Comments: independent, family drives to appts as pt and wife do not drive. Family brings meals/eat out, 2 falls leading to this admission        OT Problem List: Decreased range of motion;Decreased strength;Decreased activity tolerance;Impaired balance (sitting and/or standing);Decreased coordination;Decreased cognition;Decreased safety awareness      OT Treatment/Interventions: Self-care/ADL training;Therapeutic exercise;DME and/or AE instruction;Therapeutic activities;Balance training    OT Goals(Current goals can be found in the care plan section) Acute Rehab OT Goals Patient Stated Goal: home OT Goal Formulation: With patient Time For Goal Achievement: 10/14/21 Potential to  Achieve Goals: Good ADL Goals Pt Will Perform Grooming: with modified independence;standing  Pt Will Perform Upper Body Dressing: with modified independence;sitting Pt Will Perform Lower Body Dressing: with supervision;sit to/from stand Pt Will Transfer to Toilet: with supervision;ambulating;regular height toilet  OT Frequency: Min 2X/week       AM-PAC OT "6 Clicks" Daily Activity     Outcome Measure Help from another person eating meals?: None Help from another person taking care of personal grooming?: A Little Help from another person toileting, which includes using toliet, bedpan, or urinal?: A Lot Help from another person bathing (including washing, rinsing, drying)?: A Lot Help from another person to put on and taking off regular upper body clothing?: A Little Help from another person to put on and taking off regular lower body clothing?: A Lot 6 Click Score: 16   End of Session Equipment Utilized During Treatment: Gait belt;Rolling walker (2 wheels) Nurse Communication: Mobility status  Activity Tolerance: Patient tolerated treatment well Patient left: in chair;with call bell/phone within reach;with chair alarm set;with family/visitor present  OT Visit Diagnosis: Unsteadiness on feet (R26.81);Other abnormalities of gait and mobility (R26.89);Muscle weakness (generalized) (M62.81);History of falling (Z91.81)                Time: 1700-1749 OT Time Calculation (min): 31 min Charges:  OT General Charges $OT Visit: 1 Visit OT Evaluation $OT Eval Moderate Complexity: 1 Mod OT Treatments $Therapeutic Activity: 8-22 mins   Verdis Koval A Jamaul Heist 09/30/2021, 2:28 PM

## 2021-09-30 NOTE — Progress Notes (Signed)
PROGRESS NOTE  Clarence Myers JQB:[id number] DOB: 1934/01/30 DOA: 09/29/2021 PCP: Mayra Neer, MD  Brief History   Clarence Myers is a 88 y.o. male with medical history significant of HTN, CAD, HLD, glaucoma who presents for evaluation of generalized weakness with ataxia and slurred speech.  He reports that he began to have weakness and some ataxia last night and when he woke up this morning he had garbled slurred speech.  He was recently started on acetazolamide for his glaucoma by his ophthalmologist was told that it could cause some continue in his hands and feet so when the initial symptoms started he attributed it to that.  When his speech was slurred this morning the family became more concerned and as it persisted throughout the day despite having stopped the medication 24 hours earlier they decided to come for evaluation.  He did not have any loss of consciousness.  He did have a fall last night and again this morning but had no head trauma and he denies any headache.  He reports he has been off balance and has had generalized weakness when he tries to walk but it cannot attribute to one side or the other.  States he has not had any fever or chills.  He denies any chest pain, palpitations, shortness of breath, urinary symptoms, abdominal pain, nausea vomiting or diarrhea. With his wife.  Denies tobacco, alcohol, illicit drug use.   History Clarence Myers has been hemodynamically stable in the emergency room.  He was found to have mild hypokalemia and was given oral potassium supplementation.  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  Hospitalist service was asked to admit for further stroke work-up and neurology was consulted by the ER physician  Consultants  Neurology  Procedures    Antibiotics   Anti-infectives (From admission, onward)    None      Subjective  The patient is resting comfortably. No new complaints.  Objective    Vitals:  Vitals:   09/30/21 0817 09/30/21 1129  BP: 115/66 124/70  Pulse: 70 68  Resp: 19 17  Temp: 97.7 F (36.5 C) 97.9 F (36.6 C)  SpO2: 98% 100%    Exam:  Constitutional:  The patient is awake, alert, and oriented x 3. No acute distress. Respiratory:  No increased work of breathing. No wheezes, rales, or rhonchi No tactile fremitus Cardiovascular:  Regular rate and rhythm No murmurs, ectopy, or gallups. No lateral PMI. No thrills. Abdomen:  Abdomen is soft, non-tender, non-distended No hernias, masses, or organomegaly Normoactive bowel sounds.  Musculoskeletal:  No cyanosis, clubbing, or edema Skin:  No rashes, lesions, ulcers palpation of skin: no induration or nodules Neurologic:  Slurred speech Sensation all 4 extremities intact Psychiatric:  Mental status Mood, affect appropriate Orientation to person, place, time  judgment and insight appear intact  I have personally reviewed the following:   Today's Data  Vitals  Lab Data  BMP CBC  Micro Data    Imaging  CT head MRI brain  Cardiology Data  Echocardiogram is pending.  Other Data    Scheduled Meds:  aspirin EC  81 mg Oral Daily   clopidogrel  75 mg Oral Daily   cycloSPORINE  1 drop Both Eyes BID   latanoprost  1 drop Both Eyes QHS   multivitamin  1 tablet Oral Daily   omega-3 acid ethyl esters  1 g Oral Daily   pantoprazole  40 mg Oral Daily  simvastatin  40 mg Oral Daily   Continuous Infusions:  sodium chloride 50 mL/hr at 09/30/21 3567    Principal Problem:   CVA (cerebral vascular accident) [facility]) Active Problems:   CAD (coronary artery disease)   Hyperlipidemia   Essential hypertension   Hypokalemia   LOS: 1 day   A & P  Assessment and Plan: * CVA (cerebral vascular accident) ([place])- (present on admission) Clarence Myers admitted to medical telemetry floor for TIA/CVA.  MRI has confirmed the presence of small acute infarcts in the corpus callosum and pons. Will  evaluate carotids for large vessel occlusion/stenosis. Obtain echocardiogram to evaluate for PFO, wall motion and ejection fraction. Hypertension of 220/110 will be allowed for 24 hours per stroke protocol.  After which blood pressure will be slowly reduced to goal level. Antiplatelet therapy with aspirin daily. Continue statin therapy.  Check lipid panel.  Consult ST/PT in am Neurochecks per stroke protocol Neurology consulted by ER physician   Hypokalemia- (present on admission) Was given po potassium supplementation in Er.  Magnesium level ordered and pending and will treat if low Check electrolytes, renal function in am  Essential hypertension Permissive Hypertension overnight and will resume metoprolol in am. Monitor BP  Hyperlipidemia- (present on admission) Continue statin  CAD (coronary artery disease)- (present on admission) Continue statin. Metoprolol will be resumed after permissive hypertension period ends   I have seen and examined this patient myself. I have spent 34 minutes in his evaluation and care.  DVT prophylaxis: SCD's Code Status: Full Code Family Communication: None available Disposition Plan: tbd    Noella Kipnis, DO [facility] Direct contact: see [email or web address]  7PM-7AM contact night coverage as above 09/30/2021, 2:55 PM  LOS: 1 day

## 2021-09-30 NOTE — Progress Notes (Signed)
Inpatient Rehab Admissions Coordinator Note:   Per PT/OT patient was screened for CIR candidacy by Brittanee Ghazarian Danford Bad, CCC-SLP. At this time, pt appears to be a potential candidate for CIR. I will place an order for rehab consult for full assessment, per our protocol.  Please contact me any with questions.Gayland Curry, [place], [place] Admissions Coordinator [phone] 09/30/21 7:08 PM

## 2021-09-30 NOTE — Evaluation (Addendum)
Physical Therapy Evaluation Patient Details Name: Clarence Myers MRN: [id number] DOB: 1934/02/04 Today's Date: 09/30/2021  History of Present Illness  Patient is a 88 y/o male who presents on 09/29/21 with slurred speech, weakness and difficulty walking; s/p 2 falls at home. 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 Found to have hypokalemia as well. PMH includes CAD, low back strain.  Clinical Impression  Patient presents with right sided weakness, impaired balance and impaired mobility s/p above. Pt is Mod I and lives at home with elderly wife PTA. Has supportive children who assist with meals and driving. Pt reports recently needing to use RW due to balance deficits and endorses 2 falls this week. Today, pt requires Min A for all mobility and noted to have posterior lean sitting EOB as well as weakness in RLE. Encouraged use of RW for support to decrease fall risk. Education provided on signs/symptoms of CVA re BeFAST. Would benefit from AIR to maximize independence and mobility prior to return home as pt's wife not able to provide physical assist and pt not at functional/cognitive baseline.       Recommendations for follow up therapy are one component of a multi-disciplinary discharge planning process, led by the attending physician.  Recommendations may be updated based on patient status, additional functional criteria and insurance authorization.  Follow Up Recommendations Acute inpatient rehab (3hours/day)    Assistance Recommended at Discharge Frequent or constant Supervision/Assistance  Patient can return home with the following  A little help with walking and/or transfers;A little help with bathing/dressing/bathroom;Assistance with cooking/housework;Assist for transportation;Help with stairs or ramp for entrance    Equipment Recommendations None recommended by PT  Recommendations for Other Services  Rehab consult    Functional Status Assessment Patient has had a recent  decline in their functional status and demonstrates the ability to make significant improvements in function in a reasonable and predictable amount of time.     Precautions / Restrictions Precautions Precautions: Fall Precaution Comments: hx of 2 recent falls Restrictions Weight Bearing Restrictions: No      Mobility  Bed Mobility Overal bed mobility: Needs Assistance Bed Mobility: Rolling, Sidelying to Sit Rolling: Min assist Sidelying to sit: Mod assist, HOB elevated       General bed mobility comments: ASsist to bring LEs to EOB and elevate trunk, posterior bias/lean.    Transfers Overall transfer level: Needs assistance Equipment used: Rolling walker (2 wheels) Transfers: Sit to/from Stand Sit to Stand: Min assist           General transfer comment: Min A to steady in standing with cues for technique, not using RUE to push off from bed.    Ambulation/Gait Ambulation/Gait assistance: Min assist Gait Distance (Feet): 175 Feet Assistive device: Rolling walker (2 wheels) Gait Pattern/deviations: Step-through pattern, Decreased stride length, Narrow base of support, Scissoring Gait velocity: decreased Gait velocity interpretation: <1.31 ft/sec, indicative of household ambulator   General Gait Details: SLow, mildly unsteady gait with incoordinated movements of RLE with scissoring gait x2 and narrow BoS. Instability noted requiring Min A for balance.  Stairs            Wheelchair Mobility    Modified Rankin (Stroke Patients Only) Modified Rankin (Stroke Patients Only) Pre-Morbid Rankin Score: Moderate disability Modified Rankin: Moderately severe disability     Balance Overall balance assessment: Needs assistance, History of Falls Sitting-balance support: Feet supported, Bilateral upper extremity supported Sitting balance-Leahy Scale: Fair Sitting balance - Comments: Posterior lean, requiring UE support initially but  able to take away UE support which  worsens posterior lean esp during MMT of UEs.LEs. Postural control: Posterior lean Standing balance support: During functional activity, Reliant on assistive device for balance Standing balance-Leahy Scale: Poor Standing balance comment: Requires UE support.                             Pertinent Vitals/Pain Pain Assessment Pain Assessment: No/denies pain    Home Living Family/patient expects to be discharged to:: Private residence Living Arrangements: Spouse/significant other Available Help at Discharge: Family;Available 24 hours/day Type of Home: House Home Access: Stairs to enter Entrance Stairs-Rails: None Entrance Stairs-Number of Steps: 2-3 Alternate Level Stairs-Number of Steps: 1 flight Home Layout: Two level;Able to live on main level with bedroom/bathroom Home Equipment: Shower seat;Cane - single point;Rolling Walker (2 wheels) Additional Comments: wife is unwell, unable to physically assist pt.    Prior Function Prior Level of Function : Needs assist             Mobility Comments: Uses RW for ambulation. Active wtih OPPT ADLs Comments: independent, family drives to appts as pt and wife do not drive. Family brings meals/eat out, 2 falls leading to this admission     Hand Dominance   Dominant Hand: Right    Extremity/Trunk Assessment   Upper Extremity Assessment Upper Extremity Assessment: Generalized weakness;RUE deficits/detail;LUE deficits/detail RUE Deficits / Details: grossly 4/5, slow and deliberate coordination RUE Coordination: decreased fine motor LUE Deficits / Details: grossly 4/5, slow and deliberate coordination LUE Sensation: WNL LUE Coordination: decreased fine motor    Lower Extremity Assessment Lower Extremity Assessment: Defer to PT evaluation    Cervical / Trunk Assessment Cervical / Trunk Assessment: Kyphotic  Communication   Communication: Expressive difficulties  Cognition Arousal/Alertness: Awake/alert Behavior  During Therapy: WFL for tasks assessed/performed Overall Cognitive Status: No family/caregiver present to determine baseline cognitive functioning                                 General Comments: Appears WFL for basic mobility tasks; obtaining history is a little challenging. Follows commands well. Understands he had a CVA and balance is not great.        General Comments General comments (skin integrity, edema, etc.): VSS on RA, daughters present and supportive. Increased time spent explaining rehab and BE FAST education    Exercises     Assessment/Plan    PT Assessment    PT Problem List         PT Treatment Interventions      PT Goals (Current goals can be found in the Care Plan section)       Frequency       Co-evaluation               AM-PAC PT "6 Clicks" Mobility  Outcome Measure                  End of Session              Time:  -      Charges:              Zettie Cooley, DPT Acute Rehabilitation Services Pager [phone] Office [place] 09/30/2021, 3:42 PM

## 2021-09-30 NOTE — Progress Notes (Addendum)
STROKE TEAM PROGRESS NOTE   INTERVAL HISTORY His daughter is at the bedside.  Speech is still dysarthric and patient states that he is having trouble getting the words that he wants to say out.  He feels generally weak but is not noticing weakness on one side or the other.  He has fallen multiple times in the past week related to this generalized weakness.  He was seen recently at his ophthalmologist for glaucoma and was started on acetazolamide which she initially attributed to his symptoms.  He states that he does not fully remember the events the day of the stroke. MRI scan of the brain showed subacute left pontine and corpus callosal lacunar infarcts along with multiple old lacunar infarcts and changes of chronic small vessel disease.  CT angiogram shows severe proximal right PCA stenosis which is unrelated to his strokes Vitals:   09/30/21 0412 09/30/21 0442 09/30/21 0817 09/30/21 1129  BP: 122/79 126/72 115/66 124/70  Pulse:  66 70 68  Resp: 18 18 19 17   Temp: 98 F (36.7 C) 98.6 F (37 C) 97.7 F (36.5 C) 97.9 F (36.6 C)  TempSrc: Oral Oral Oral Oral  SpO2: 100% 100% 98% 100%  Weight:      Height:       CBC:  Recent Labs  Lab 09/29/21 1534 09/29/21 1549  WBC 9.1  --   NEUTROABS 5.3  --   HGB 12.7* 13.6  HCT 40.1 40.0  MCV 98.0  --   PLT 293  --    Basic Metabolic Panel:  Recent Labs  Lab 09/29/21 1534 09/29/21 1549 09/30/21 0231  NA 139 141  --   K 3.4* 3.3*  --   CL 110 110  --   CO2 20*  --   --   GLUCOSE 111* 108*  --   BUN 23 24*  --   CREATININE 1.06 1.10  --   CALCIUM 9.0  --   --   MG  --   --  2.2   Lipid Panel:  Recent Labs  Lab 09/29/21 1534  CHOL 153  TRIG 153*  HDL 58  CHOLHDL 2.6  VLDL 31  LDLCALC 64   HgbA1c:  Recent Labs  Lab 09/29/21 0446  HGBA1C 5.8*   Urine Drug Screen:  Recent Labs  Lab 09/29/21 1549  LABOPIA NONE DETECTED  COCAINSCRNUR NONE DETECTED  LABBENZ NONE DETECTED  AMPHETMU NONE DETECTED  THCU NONE DETECTED   LABBARB NONE DETECTED    Alcohol Level  Recent Labs  Lab 09/29/21 1534  ETH <10    IMAGING past 24 hours CT ANGIO HEAD W OR WO CONTRAST  Result Date: 09/30/2021 CLINICAL DATA:  Stroke follow-up EXAM: CT ANGIOGRAPHY HEAD AND NECK TECHNIQUE: Multidetector CT imaging of the head and neck was performed using the standard protocol during bolus administration of intravenous contrast. Multiplanar CT image reconstructions and MIPs were obtained to evaluate the vascular anatomy. Carotid stenosis measurements (when applicable) are obtained utilizing NASCET criteria, using the distal internal carotid diameter as the denominator. RADIATION DOSE REDUCTION: This exam was performed according to the departmental dose-optimization program which includes automated exposure control, adjustment of the mA and/or kV according to patient size and/or use of iterative reconstruction technique. CONTRAST:  67mL OMNIPAQUE IOHEXOL 350 MG/ML SOLN COMPARISON:  Brain MRI from yesterday FINDINGS: CTA NECK FINDINGS Aortic arch: Brachiocephalic origin is not covered. Mild atheromatous wall thickening. Right carotid system: Moderate mainly calcified plaque at the bifurcation without flow limiting stenosis  or ulceration. Left carotid system: Mild to moderate calcified plaque at the bifurcation without flow limiting stenosis or ulceration. Vertebral arteries: Proximal subclavian atherosclerosis at the left origin. Calcified plaque at both vertebral ostia with only mild luminal narrowing. The left vertebral artery is dominant. Both vertebral arteries are smooth and patent to the dura. Skeleton: No acute or aggressive finding. Other neck: No acute or aggressive finding Upper chest: Negative Review of the MIP images confirms the above findings CTA HEAD FINDINGS Anterior circulation: Atheromatous plaque along the carotid siphons with mild narrowing at the right paraclinoid segment. No branch occlusion, beading, or aneurysm. Posterior  circulation: Left dominant vertebral artery. Early branching right PICA at or just before the dura. Calcified plaque on both V4 segments. Severe proximal right PCA stenosis with downstream attenuation. No acute infarct in this distribution by preceding MRI, presumably chronic Venous sinuses: Unremarkable in the arterial phase Anatomic variants: None significant Review of the MIP images confirms the above findings IMPRESSION: 1. 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 No acute infarct in this distribution by preceding MRI. 2. Otherwise, no flow limiting stenosis at sites of cervical and intracranial atherosclerosis. Electronically Signed   By: Jorje Guild M.D.   On: 09/30/2021 10:08   CT HEAD WO CONTRAST  Result Date: 09/29/2021 CLINICAL DATA:  Neuro deficit, acute, stroke suspected EXAM: CT HEAD WITHOUT CONTRAST TECHNIQUE: Contiguous axial images were obtained from the base of the skull through the vertex without intravenous contrast. RADIATION DOSE REDUCTION: This exam was performed according to the departmental dose-optimization program which includes automated exposure control, adjustment of the mA and/or kV according to patient size and/or use of iterative reconstruction technique. COMPARISON:  None. FINDINGS: Brain: There is no acute intracranial hemorrhage, mass effect, or edema. No acute appearing loss of gray-white differentiation. Small chronic infarct of the inferior right occipital lobe. There is no extra-axial fluid collection. Prominence of the ventricles and sulci reflects parenchymal volume loss. Patchy and confluent hypoattenuation in the supratentorial white matter is nonspecific but may reflect moderate to advanced chronic microvascular ischemic changes. Vascular: There is atherosclerotic calcification at the skull base. Skull: Calvarium is unremarkable. Sinuses/Orbits: No acute finding. Other: None. IMPRESSION: No acute intracranial abnormality. Parenchymal volume  loss and chronic microvascular ischemic changes. Electronically Signed   By: Macy Mis M.D.   On: 09/29/2021 16:21   CT ANGIO NECK W OR WO CONTRAST  Result Date: 09/30/2021 CLINICAL DATA:  Stroke follow-up EXAM: CT ANGIOGRAPHY HEAD AND NECK TECHNIQUE: Multidetector CT imaging of the head and neck was performed using the standard protocol during bolus administration of intravenous contrast. Multiplanar CT image reconstructions and MIPs were obtained to evaluate the vascular anatomy. Carotid stenosis measurements (when applicable) are obtained utilizing NASCET criteria, using the distal internal carotid diameter as the denominator. RADIATION DOSE REDUCTION: This exam was performed according to the departmental dose-optimization program which includes automated exposure control, adjustment of the mA and/or kV according to patient size and/or use of iterative reconstruction technique. CONTRAST:  5mL OMNIPAQUE IOHEXOL 350 MG/ML SOLN COMPARISON:  Brain MRI from yesterday FINDINGS: CTA NECK FINDINGS Aortic arch: Brachiocephalic origin is not covered. Mild atheromatous wall thickening. Right carotid system: Moderate mainly calcified plaque at the bifurcation without flow limiting stenosis or ulceration. Left carotid system: Mild to moderate calcified plaque at the bifurcation without flow limiting stenosis or ulceration. Vertebral arteries: Proximal subclavian atherosclerosis at the left origin. Calcified plaque at both vertebral ostia with only mild luminal narrowing. The left vertebral artery  is dominant. Both vertebral arteries are smooth and patent to the dura. Skeleton: No acute or aggressive finding. Other neck: No acute or aggressive finding Upper chest: Negative Review of the MIP images confirms the above findings CTA HEAD FINDINGS Anterior circulation: Atheromatous plaque along the carotid siphons with mild narrowing at the right paraclinoid segment. No branch occlusion, beading, or aneurysm. Posterior  circulation: Left dominant vertebral artery. Early branching right PICA at or just before the dura. Calcified plaque on both V4 segments. Severe proximal right PCA stenosis with downstream attenuation. No acute infarct in this distribution by preceding MRI, presumably chronic Venous sinuses: Unremarkable in the arterial phase Anatomic variants: None significant Review of the MIP images confirms the above findings IMPRESSION: 1. 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 No acute infarct in this distribution by preceding MRI. 2. Otherwise, no flow limiting stenosis at sites of cervical and intracranial atherosclerosis. Electronically Signed   By: Jorje Guild M.D.   On: 09/30/2021 10:08   MR BRAIN WO CONTRAST  Result Date: 09/29/2021 CLINICAL DATA:  Neuro deficit, acute, stroke suspected. Slurred speech. Generalized weakness with multiple falls. EXAM: MRI HEAD WITHOUT CONTRAST TECHNIQUE: Multiplanar, multiecho pulse sequences of the brain and surrounding structures were obtained without intravenous contrast. COMPARISON:  Head CT 09/29/2021 FINDINGS: Brain: Subcentimeter acute infarcts are present in the left paracentral pons and left genu of the corpus callosum. There is encephalomalacia in the right parietal lobe with associated chronic blood products. Patchy and confluent T2 hyperintensities in the cerebral white matter bilaterally are nonspecific but compatible with moderate to severe chronic small vessel ischemic disease. Mild chronic small vessel changes are present in the pons. Moderate lateral ventriculomegaly is favored to be secondary to cerebral atrophy rather than hydrocephalus. Dilated perivascular spaces are present throughout the basal ganglia. No mass, midline shift, or extra-axial fluid collection is identified. Vascular: Major intracranial vascular flow voids are preserved. Skull and upper cervical spine: Unremarkable bone marrow signal. Sinuses/Orbits: Bilateral cataract  extraction. Small mucous retention cyst in the left maxillary sinus. Clear mastoid air cells. Other: None. IMPRESSION: 1. 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 2. Moderate to severe chronic small vessel ischemic disease and cerebral atrophy. 3. Chronic right parietal hemorrhagic infarct. Electronically Signed   By: Logan Bores M.D.   On: 09/29/2021 21:13    PHYSICAL EXAM  Physical Exam  Constitutional: Pleasant elderly Caucasian male cardiovascular: Normal rate and regular rhythm.  Respiratory: Effort normal, non-labored breathing, on room air  Neuro: Mental Status: Patient is awake, alert, oriented to person, place, month, year, and situation. Speech is mildly dysarthric but can be understood without difficulty No neglect Cranial Nerves: II: Visual Fields are full. Pupils are equal, round, and reactive to light.   III,IV, VI: EOMI without ptosis or diploplia.  V: Facial sensation is symmetric to temperature VII: Trace right lower facial asymmetry when he smiles.  VIII: Hearing is intact to voice X: Palate elevates symmetrically XI: Shoulder shrug is symmetric. XII: Tongue protrudes midline without atrophy or fasciculations.  Motor: Tone is normal. Bulk is normal.  Trace weakness noted in right hand with hand grasp and right foot with plantar and dorsiflexion when compared to the left.  Mild right lower extremity drift.  Diminished fine finger movements on the right.  Orbits left over right upper extremity. Sensory: Sensation is symmetric to light touch and temperature in the arms and legs. No extinction to DSS present.  Cerebellar: Slowed rapid alternating movements with right upper extremity.  ASSESSMENT/PLAN Clarence Myers is a 88 y.o. male with history of HTN, HLD, CAD, right shoulder surgery and glaucoma presenting with new onset of right sided weakness, difficulty ambulating and slurred speech since Thursday.  Patient recently started acetazolamide on Tuesday  for his glaucoma and states that he thought his symptoms were initially related to that, however his daughter called the ophthalmologist on Friday when she noticed how weak and slurred his speech was and they stated that this was not an expected side effect and she was encouraged to take him to the ED.  He did fall on Friday as well.   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 Small vessel disease. Atrophy.  CTA head & neck Severe right proximal PCA stenosis with downstream underfilling. No acute infarct in this distribution by preceding MRI. MRI small acute pontine and corpus callosum infarcts, moderate to severe chronic small vessel ischemic disease and cerebral atrophy, chronic right parietal hemorrhagic infarct 2D Echo pending Carotid ultrasound-pending LDL 64 HgbA1c 5.8 VTE prophylaxis -SCDs    Diet   Diet Heart Room service appropriate? Yes; Fluid consistency: Thin   aspirin 81 mg daily prior to admission, now on aspirin 81 mg daily and clopidogrel 75 mg daily for 3 weeks and then Plavix alone Therapy recommendations: Home health physical therapy Disposition: Pending  Hypertension Home meds: Metoprolol tartrate 12.5 mg twice daily Stable Permissive hypertension (OK if < 220/120) but gradually normalize in 5-7 days Long-term BP goal normotensive  Hyperlipidemia Home meds: Simvastatin 40 mg, resumed in hospital LDL 64, goal < 70 Continue statin at discharge   Other Stroke Risk Factors Advanced Age >/= 51  Hx stroke/TIA Chronic right parietal hemorrhagic infarct Coronary artery disease  Other Active Problems Glaucoma Follows with ophthalmology outpatient  Hospital day # 1  Patient seen and examined by NP/APP with MD. MD to update note as needed.   Janine Ores, DNP, FNP-BC [facility] Pager: [phone] I have personally obtained history,examined this  patient, reviewed notes, independently viewed imaging studies, participated in medical decision making and plan of care.ROS completed by me personally and pertinent positives fully documented  I have made any additions or clarifications directly to the above note. Agree with note above.  Patient presented with dysarthria and frequent falls secondary to subacute pontine and left corpus callosum infarct from small vessel disease.  Recommend aspirin Plavix for 3 weeks followed by Plavix alone and maintain aggressive risk factor modification.  Mobilize out of bed.  Therapy consults.  Discussed with patient and son at the bedside and answered questions.  Discussed with Dr.Swayze.  Greater than 50% time during this 50-minute visit was spent on counseling and coordination of care about his lacunar strokes and discussion about stroke prevention, treatment and answering questions.  Antony Contras, MD Medical Director [facility] Pager: [phone] 09/30/2021 3:42 PM  To contact Stroke Continuity provider, please refer to [email or web address] After hours, contact General Neurology

## 2021-10-01 ENCOUNTER — Inpatient Hospital Stay (HOSPITAL_COMMUNITY): Payer: PPO

## 2021-10-01 ENCOUNTER — Encounter (HOSPITAL_COMMUNITY): Payer: PPO

## 2021-10-01 DIAGNOSIS — R1312 Dysphagia, oropharyngeal phase: Secondary | ICD-10-CM

## 2021-10-01 DIAGNOSIS — I6389 Other cerebral infarction: Secondary | ICD-10-CM | POA: Diagnosis not present

## 2021-10-01 LAB — ECHOCARDIOGRAM COMPLETE
AR max vel: 1.06 cm2
AV Area VTI: 1.13 cm2
AV Area mean vel: 1.07 cm2
AV Mean grad: 10 mmHg
AV Peak grad: 18.7 mmHg
Ao pk vel: 2.16 m/s
Height: 63 in
S' Lateral: 2.3 cm
Weight: 2179.91 oz

## 2021-10-01 IMAGING — DX DG CHEST 1V PORT
1 series · 1 of 1 positions shown · non-contrast
Comparison: [DATE].

CLINICAL DATA: Weakness.  Aspiration into the airway.

EXAM:
PORTABLE CHEST 1 VIEW

[chest]
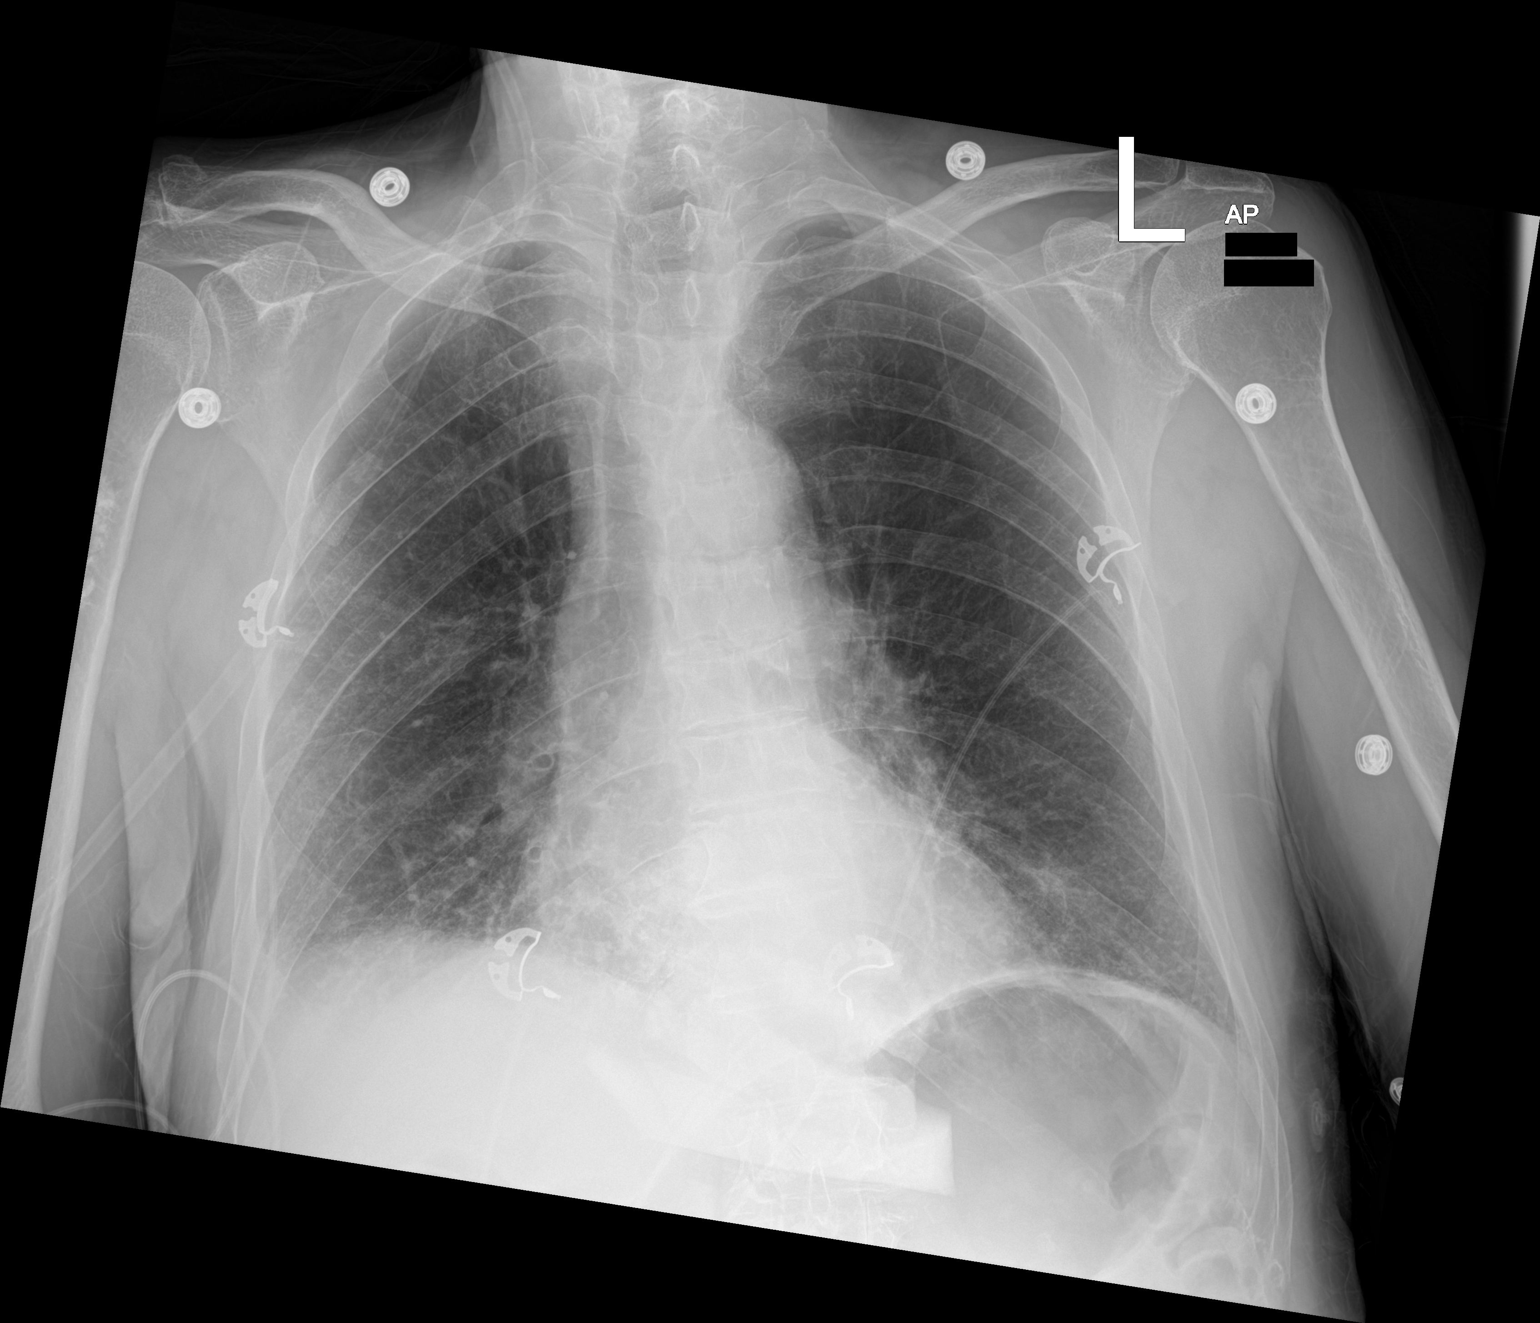

[1 of 1 positions shown; findings below may reference images not displayed]

FINDINGS: Cardiac silhouette is normal in size. No mediastinal or hilar
masses.

Lungs show chronic interstitial thickening most evident in the bases
stable from the prior chest radiograph. Lungs otherwise clear. No
pleural effusion. No pneumothorax.

Skeletal structures are grossly intact.
IMPRESSION: No acute cardiopulmonary disease.

## 2021-10-01 MED ORDER — HYDROMORPHONE HCL 1 MG/ML IJ SOLN
0.5000 mg | INTRAMUSCULAR | Status: DC | PRN
Start: 2021-10-01 — End: 2021-10-04
  Administered 2021-10-01 – 2021-10-04 (×2): 0.5 mg via INTRAVENOUS
  Filled 2021-10-01 (×2): qty 0.5

## 2021-10-01 MED ORDER — ONDANSETRON HCL 4 MG/2ML IJ SOLN
4.0000 mg | Freq: Four times a day (QID) | INTRAMUSCULAR | Status: DC | PRN
  Filled 2021-10-01: qty 2

## 2021-10-01 NOTE — Progress Notes (Signed)
STROKE TEAM PROGRESS NOTE   INTERVAL HISTORY His RN is at the bedside.  Speech is still dysarthric with right-sided weakness appears to be improving.  He has been seen by therapist recommend inpatient rehab.  Patient has been eating well and IV fluids can be discontinued.  We will discontinue carotid ultrasound study as he had a CT angio on admission.  Vital signs stable.  2D echo is pending Vitals:   09/30/21 1926 09/30/21 2329 10/01/21 0327 10/01/21 1241  BP: 102/71 119/66 115/66 (!) 141/83  Pulse: 67 67 69   Resp: 16 16    Temp: 97.6 F (36.4 C) 98.2 F (36.8 C) 98 F (36.7 C)   TempSrc: Oral Oral Oral   SpO2: 99% 100% 99% 96%  Weight:      Height:       CBC:  Recent Labs  Lab 09/29/21 1534 09/29/21 1549  WBC 9.1  --   NEUTROABS 5.3  --   HGB 12.7* 13.6  HCT 40.1 40.0  MCV 98.0  --   PLT 293  --    Basic Metabolic Panel:  Recent Labs  Lab 09/29/21 1534 09/29/21 1549 09/30/21 0231  NA 139 141  --   K 3.4* 3.3*  --   CL 110 110  --   CO2 20*  --   --   GLUCOSE 111* 108*  --   BUN 23 24*  --   CREATININE 1.06 1.10  --   CALCIUM 9.0  --   --   MG  --   --  2.2   Lipid Panel:  Recent Labs  Lab 09/29/21 1534  CHOL 153  TRIG 153*  HDL 58  CHOLHDL 2.6  VLDL 31  LDLCALC 64   HgbA1c:  Recent Labs  Lab 09/29/21 0446  HGBA1C 5.8*   Urine Drug Screen:  Recent Labs  Lab 09/29/21 1549  LABOPIA NONE DETECTED  COCAINSCRNUR NONE DETECTED  LABBENZ NONE DETECTED  AMPHETMU NONE DETECTED  THCU NONE DETECTED  LABBARB NONE DETECTED    Alcohol Level  Recent Labs  Lab 09/29/21 1534  ETH <10    IMAGING past 24 hours No results found.  PHYSICAL EXAM  Physical Exam  Constitutional: Pleasant elderly Caucasian male cardiovascular: Normal rate and regular rhythm.  Respiratory: Effort normal, non-labored breathing, on room air  Neuro: Mental Status: Patient is awake, alert, oriented to person, place, month, year, and situation. Speech is mildly  dysarthric but can be understood without difficulty No neglect Cranial Nerves: II: Visual Fields are full. Pupils are equal, round, and reactive to light.   III,IV, VI: EOMI without ptosis or diploplia.  V: Facial sensation is symmetric to temperature VII: Trace right lower facial asymmetry when he smiles.  VIII: Hearing is intact to voice X: Palate elevates symmetrically XI: Shoulder shrug is symmetric. XII: Tongue protrudes midline without atrophy or fasciculations.  Motor: Tone is normal. Bulk is normal.  Trace weakness noted in right hand with hand grasp and right foot with plantar and dorsiflexion when compared to the left.  Mild right lower extremity drift.  Diminished fine finger movements on the right.  Orbits left over right upper extremity. Sensory: Sensation is symmetric to light touch and temperature in the arms and legs. No extinction to DSS present.  Cerebellar: Slowed rapid alternating movements with right upper extremity.   ASSESSMENT/PLAN Clarence Myers is a 88 y.o. male with history of HTN, HLD, CAD, right shoulder surgery and glaucoma presenting with new  onset of right sided weakness, difficulty ambulating and slurred speech since Thursday.  Patient recently started acetazolamide on Tuesday for his glaucoma and states that he thought his symptoms were initially related to that, however his daughter called the ophthalmologist on Friday when she noticed how weak and slurred his speech was and they stated that this was not an expected side effect and she was encouraged to take him to the ED.  He did fall on Friday as well.   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 Small vessel disease. Atrophy.  CTA head & neck Severe right proximal PCA stenosis with downstream underfilling. No acute infarct in this distribution by preceding MRI. MRI small acute pontine and corpus callosum  infarcts, moderate to severe chronic small vessel ischemic disease and cerebral atrophy, chronic right parietal hemorrhagic infarct 2D Echo pending Carotid ultrasound-pending LDL 64 HgbA1c 5.8 VTE prophylaxis -SCDs    Diet   Diet Heart Room service appropriate? Yes; Fluid consistency: Thin   aspirin 81 mg daily prior to admission, now on aspirin 81 mg daily and clopidogrel 75 mg daily for 3 weeks and then Plavix alone Therapy recommendations: Home health physical therapy Disposition: Pending  Hypertension Home meds: Metoprolol tartrate 12.5 mg twice daily Stable Permissive hypertension (OK if < 220/120) but gradually normalize in 5-7 days Long-term BP goal normotensive  Hyperlipidemia Home meds: Simvastatin 40 mg, resumed in hospital LDL 64, goal < 70 Continue statin at discharge   Other Stroke Risk Factors Advanced Age >/= 49  Hx stroke/TIA Chronic right parietal hemorrhagic infarct Coronary artery disease  Other Active Problems Glaucoma Follows with ophthalmology outpatient  Hospital day # 2   Patient presented with dysarthria and frequent falls secondary to subacute pontine and left corpus callosum infarct from small vessel disease.  Recommend aspirin Plavix for 3 weeks followed by Plavix alone and maintain aggressive risk factor modification.  Mobilize out of bed.  Inpatient rehab consult..  Discussed with patient   and answered questions.  Stroke team will sign off.  Follow-up as an outpatient in the stroke clinic in 2 months.  Kindly call for questions greater than 50% time during this 35-minute visit was spent on counseling and coordination of care about his lacunar strokes and discussion about stroke prevention, treatment and answering questions.  Antony Contras, MD Medical Director [facility] Stroke Center Pager: [phone] 10/01/2021 1:29 PM  To contact Stroke Continuity provider, please refer to [email or web address] After hours, contact General Neurology

## 2021-10-01 NOTE — Evaluation (Signed)
Speech Language Pathology Evaluation Patient Details Name: Clarence Myers MRN: [id number] DOB: [birth year] Today's Date: 10/01/2021 Time: 09-1538 SLP Time Calculation (min) (ACUTE ONLY): 20 min  Problem List:  Patient Active Problem List   Diagnosis Date Noted   Slurred speech    CVA (cerebral vascular accident) ([place]) 09/29/2021   Essential hypertension 09/29/2021   Hypokalemia 09/29/2021   CAD (coronary artery disease)    Hyperlipidemia    Past Medical History:  Past Medical History:  Diagnosis Date   CAD (coronary artery disease)    Hyperlipidemia    Low back strain    Shoulder injury    Fell and injured Right shoulder   Past Surgical History:  Past Surgical History:  Procedure Laterality Date   ANGIOPLASTY     1992-LAD   CARDIAC CATHETERIZATION     HPI:  Patient is a 88 y/o male who presents on 09/29/21 with slurred speech, weakness and difficulty walking; s/p 2 falls at home. 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 Found to have hypokalemia as well. PMH includes CAD, low back strain. Lungs clear per CXR taken 2/12.   Assessment / Plan / Recommendation Clinical Impression  Patient presents with a mild-moderate dysarthria and a questionable mild cognitive deficit. He was fully oriented and able to recall recent nursing and therapeutic events but did require cue from daughter regarding PT session. He did not appear to be fully aware or understanding of his deficits from CVA, requiring SLP to cue him to use spoon with non-dominant hand as his right hand is weak and unable to achieve adequate motor movements. Patient become emotionally labile when reading beginning of "Grandfather Passage". Speech was approximately 75% intelligible at phrase and sentence level when context was known. Patient was distracted somewhat by being uncomfortable in bed and he was requesting to hang his legs over edge of bed. RN then entered room as his HR was elevated. SLP is recommending  acute-care level SLP intervention to address dysarthria and cognition and recommendation for more in depth cognitive assessment when able.    SLP Assessment  SLP Recommendation/Assessment: Patient needs continued Speech Lanaguage Pathology Services SLP Visit Diagnosis: Dysarthria and anarthria (R47.1);Cognitive communication deficit (R41.841)    Recommendations for follow up therapy are one component of a multi-disciplinary discharge planning process, led by the attending physician.  Recommendations may be updated based on patient status, additional functional criteria and insurance authorization.    Follow Up Recommendations  Acute inpatient rehab (3hours/day)    Assistance Recommended at Discharge  Intermittent Supervision/Assistance  Functional Status Assessment Patient has had a recent decline in their functional status and demonstrates the ability to make significant improvements in function in a reasonable and predictable amount of time.  Frequency and Duration min 2x/week         SLP Evaluation Cognition  Overall Cognitive Status: Difficult to assess Arousal/Alertness: Awake/alert Orientation Level: Oriented X4 Year: 2023 Month: February Day of Week: Correct Attention: Sustained Sustained Attention: Appears intact Memory: Appears intact Awareness: Impaired Awareness Impairment: Emergent impairment Safety/Judgment: Other (comment) Comments: Patient does not appear to fully understand his stroke deficits       Comprehension  Auditory Comprehension Overall Auditory Comprehension: Appears within functional limits for tasks assessed    Expression Expression Primary Mode of Expression: Verbal Verbal Expression Overall Verbal Expression: Appears within functional limits for tasks assessed   Oral / Motor  Oral Motor/Sensory Function Overall Oral Motor/Sensory Function: Mild impairment Facial ROM: Reduced right Facial Symmetry: Abnormal symmetry right  Lingual ROM: Within  Functional Limits Lingual Symmetry: Within Functional Limits Lingual Strength: Reduced Motor Speech Overall Motor Speech: Appears within functional limits for tasks assessed Respiration: Impaired Level of Impairment: Phrase Phonation: Low vocal intensity Resonance: Within functional limits Articulation: Impaired Level of Impairment: Phrase Intelligibility: Intelligibility reduced Word: 75-100% accurate Phrase: 50-74% accurate Sentence: 50-74% accurate Conversation: 50-74% accurate Motor Planning: Witnin functional limits Motor Speech Errors: Not applicable Effective Techniques: Slow rate;Increased vocal intensity;Over-articulate           Sonia Baller, MA, CCC-SLP Speech Therapy

## 2021-10-01 NOTE — Evaluation (Signed)
Clinical/Bedside Swallow Evaluation Patient Details  Name: Clarence Myers MRN: [id number] Date of Birth: [birth year]  Today's Date: 10/01/2021 Time: SLP Start Time (ACUTE ONLY): 79 SLP Stop Time (ACUTE ONLY): 1555 SLP Time Calculation (min) (ACUTE ONLY): 15 min  Past Medical History:  Past Medical History:  Diagnosis Date   CAD (coronary artery disease)    Hyperlipidemia    Low back strain    Shoulder injury    Fell and injured Right shoulder   Past Surgical History:  Past Surgical History:  Procedure Laterality Date   ANGIOPLASTY     1992-LAD   CARDIAC CATHETERIZATION     HPI:  Patient is a 88 y/o male who presents on 09/29/21 with slurred speech, weakness and difficulty walking; s/p 2 falls at home. 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 Found to have hypokalemia as well. PMH includes CAD, low back strain. Patient passed Yale swallow screen with nursing initially but on 2/12, he was observed to have significant amount of coughing followed by regurgitation of food at lunch meal prompting this bedside swallow evaluation. Lungs clear per CXR taken 2/12.    Assessment / Plan / Recommendation  Clinical Impression  Patient presents with clinical s/s of dysphagia as per this clinical/bedside swallow evaluation. Patient reported (confirmed by RN) that he started coughing and feeling choked when eating piece of meat today and he ended up regurgitating it. During this assessment, patient did not exhibit any immediate coughing or throat clearing but after drinking sips of water and bites of puree solids (applesauce), patient c/o globus sensation and SLP observed him to start getting ready to belch and seemed to be having some upper esophageal difficulties. SLP is recommending change to full liquids and plan for objective swallow study next date. SLP Visit Diagnosis: Dysphagia, unspecified (R13.10)    Aspiration Risk  Mild aspiration risk;Moderate aspiration risk    Diet  Recommendation Thin liquid;Other (Comment) (full liquids)   Liquid Administration via: Cup;Straw Medication Administration: Crushed with puree Supervision: Patient able to self feed;Intermittent supervision to cue for compensatory strategies Compensations: Slow rate;Small sips/bites Postural Changes: Seated upright at 90 degrees    Other  Recommendations Oral Care Recommendations: Oral care BID    Recommendations for follow up therapy are one component of a multi-disciplinary discharge planning process, led by the attending physician.  Recommendations may be updated based on patient status, additional functional criteria and insurance authorization.  Follow up Recommendations Home health SLP      Assistance Recommended at Discharge Intermittent Supervision/Assistance  Functional Status Assessment Patient has had a recent decline in their functional status and demonstrates the ability to make significant improvements in function in a reasonable and predictable amount of time.  Frequency and Duration min 2x/week  1 week       Prognosis Prognosis for Safe Diet Advancement: Good      Swallow Study   General Date of Onset: 10/01/21 HPI: Patient is a 88 y/o male who presents on 09/29/21 with slurred speech, weakness and difficulty walking; s/p 2 falls at home. 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 Found to have hypokalemia as well. PMH includes CAD, low back strain. Patient passed Yale swallow screen with nursing initially but on 2/12, he was observed to have significant amount of coughing followed by regurgitation of food at lunch meal prompting this bedside swallow evaluation. Lungs clear per CXR taken 2/12. Type of Study: Bedside Swallow Evaluation Previous Swallow Assessment: none found Diet Prior to this Study: Regular;Thin liquids  Temperature Spikes Noted: No Respiratory Status: Nasal cannula History of Recent Intubation: No Behavior/Cognition:  Alert;Cooperative;Pleasant mood Oral Cavity Assessment: Within Functional Limits Oral Care Completed by SLP: Yes Oral Cavity - Dentition: Adequate natural dentition;Dentures, top Vision: Functional for self-feeding Self-Feeding Abilities: Able to feed self;Needs assist;Other (Comment) (needed assist/cue to use spoon with non-dominant (left) hand due to right side being weak from CVA) Patient Positioning: Upright in bed Baseline Vocal Quality: Low vocal intensity;Normal Volitional Cough: Strong Volitional Swallow: Able to elicit    Oral/Motor/Sensory Function Overall Oral Motor/Sensory Function: Mild impairment Facial ROM: Reduced right Facial Symmetry: Abnormal symmetry right Lingual ROM: Within Functional Limits Lingual Symmetry: Within Functional Limits Lingual Strength: Reduced   Ice Chips     Thin Liquid Thin Liquid: Impaired Presentation: Straw;Self Fed Pharyngeal  Phase Impairments: Other (comments) Other Comments: delayed globus sensation and patient starting to belch    Nectar Thick     Honey Thick     Puree Puree: Impaired Pharyngeal Phase Impairments: Other (comments) Other Comments: patient with globus sensation and starting to belch.   Solid     Solid: Not tested     Sonia Baller, MA, CCC-SLP Speech Therapy

## 2021-10-01 NOTE — Progress Notes (Signed)
Inpatient Rehab Admissions:  Inpatient Rehab Consult received.  I met with pt and daughter, Lattie Haw at the bedside for rehabilitation assessment and to discuss goals and expectations of an inpatient rehab admission.  Both acknowledged understanding of CIR goals and expectations. Both interested in pt pursuing CIR. Daughter confirmed that family will be able to provide supervision/assistance for pt after discharge. Will continue to follow.  Signed: Gayland Curry, [place], [place] Admissions Coordinator [phone]

## 2021-10-01 NOTE — PMR Pre-admission (Shared)
PMR Admission Coordinator Pre-Admission Assessment  Patient: Clarence Myers is an 88 y.o., male MRN: [id number] DOB: 11/15/1933 Height: 5\' 3"  (160 cm) Weight: 61.8 kg  Insurance Information HMO: ***    PPO: ***     PCP:      IPA:      80/20:      OTHER:  PRIMARY: [employer]      Policy#: [id number]      Subscriber: patient CM Name: ***      Phone#: ***     Fax#: *** Pre-Cert#: ***      Employer: *** Benefits:  Phone #: ***     Name: *** Irene Shipper. Date: ***     Deduct: ***      Out of Pocket Max: ***      Life Max: *** CIR: ***      SNF: *** Outpatient: ***     Co-Pay: *** Home Health: ***      Co-Pay: *** DME: ***     Co-Pay: *** Providers: in-network SECONDARY:      Policy#:      Phone#:   Financial Counselor:       Phone#:   The Actuary for patients in Inpatient Rehabilitation Facilities with attached Privacy Act [place] Records was provided and verbally reviewed with: {CHL IP Patient Family YC:[id number]}  Emergency Contact Information Contact Information     Name Relation Home Work [place] Daughter   [phone]   Brady,Vicki Daughter   [phone]   SAYRE, [place] [phone]         Current Medical History  Patient Admitting Diagnosis: CVA History of Present Illness: *** Complete NIHSS TOTAL: 5  Patient's medical record from [facility] has been reviewed by the rehabilitation admission coordinator and physician.  Past Medical History  Past Medical History:  Diagnosis Date   CAD (coronary artery disease)    Hyperlipidemia    Low back strain    Shoulder injury    Fell and injured Right shoulder    Has the patient had major surgery during 100 days prior to admission? No  Family History   family history is not on file.  Current Medications  Current Facility-Administered Medications:    acetaminophen (TYLENOL) tablet 650 mg, 650 mg, Oral, Q4H PRN **OR** acetaminophen  (TYLENOL) 160 MG/5ML solution 650 mg, 650 mg, Per Tube, Q4H PRN, 650 mg at 10/01/21 1612 **OR** acetaminophen (TYLENOL) suppository 650 mg, 650 mg, Rectal, Q4H PRN, Chotiner, Yevonne Aline, MD   aspirin EC tablet 81 mg, 81 mg, Oral, Daily, Chotiner, Yevonne Aline, MD, 81 mg at 10/01/21 1055   clopidogrel (PLAVIX) tablet 75 mg, 75 mg, Oral, Daily, Shafer, Devon, NP, 75 mg at 10/01/21 1056   cycloSPORINE (RESTASIS) 0.05 % ophthalmic emulsion 1 drop, 1 drop, Both Eyes, BID, Chotiner, Yevonne Aline, MD, 1 drop at 10/01/21 1055   HYDROmorphone (DILAUDID) injection 0.5 mg, 0.5 mg, Intravenous, Q3H PRN, Swayze, Ava, DO, 0.5 mg at 10/01/21 1642   latanoprost (XALATAN) 0.005 % ophthalmic solution 1 drop, 1 drop, Both Eyes, QHS, Chotiner, Yevonne Aline, MD, 1 drop at 09/30/21 2136   multivitamin (PROSIGHT) tablet 1 tablet, 1 tablet, Oral, Daily, Chotiner, Yevonne Aline, MD, 1 tablet at 10/01/21 1055   omega-3 acid ethyl esters (LOVAZA) capsule 1 g, 1 g, Oral, Daily, Chotiner, Yevonne Aline, MD, 1 g at 10/01/21 1055   ondansetron (ZOFRAN) injection 4 mg, 4 mg, Intravenous, Q6H PRN, Swayze, Ava, DO   pantoprazole (  PROTONIX) EC tablet 40 mg, 40 mg, Oral, Daily, Chotiner, Yevonne Aline, MD, 40 mg at 10/01/21 1056   senna-docusate (Senokot-S) tablet 1 tablet, 1 tablet, Oral, QHS PRN, Chotiner, Yevonne Aline, MD   simvastatin (ZOCOR) tablet 40 mg, 40 mg, Oral, Daily, Chotiner, Yevonne Aline, MD, 40 mg at 10/01/21 1055  Patients Current Diet:  Diet Order             Diet full liquid Room service appropriate? Yes; Fluid consistency: Thin  Diet effective now                   Precautions / Restrictions Precautions Precautions: Fall Precaution Comments: hx of 2 recent falls Restrictions Weight Bearing Restrictions: No   Has the patient had 2 or more falls or a fall with injury in the past year? Yes  Prior Activity Level Community (5-7x/wk): gets out of house 3-4x/week  Prior Functional Level Self Care: Did the patient need help  bathing, dressing, using the toilet or eating? Independent  Indoor Mobility: Did the patient need assistance with walking from room to room (with or without device)? Independent  Stairs: Did the patient need assistance with internal or external stairs (with or without device)? Independent  Functional Cognition: Did the patient need help planning regular tasks such as shopping or remembering to take medications? Independent  Patient Information    Patient's Response To:     Home Assistive Devices / Equipment Home Assistive Devices/Equipment: Dentures (specify type), Eyeglasses (partial upper) Home Equipment: Shower seat, Cane - single point, Conservation officer, nature (2 wheels)  Prior Device Use: Indicate devices/aids used by the patient prior to current illness, exacerbation or injury? Walker and cane  Current Functional Level Cognition  Overall Cognitive Status: Impaired/Different from baseline Current Attention Level: Selective Orientation Level: Oriented X4 Following Commands: Follows one step commands with increased time Safety/Judgement: Decreased awareness of safety, Decreased awareness of deficits General Comments: A&Ox4. Poor STM, had difficulty remembering events of this a.m. and PLOF. Required incrased cues for safety and sequencing. Daughters present & agree this is not his baseline cognition. Pt also with slurred speech and difficulty word finding.    Extremity Assessment (includes Sensation/Coordination)  Upper Extremity Assessment: Generalized weakness, RUE deficits/detail, LUE deficits/detail RUE Deficits / Details: grossly 4/5, slow and deliberate coordination RUE Coordination: decreased fine motor LUE Deficits / Details: grossly 4/5, slow and deliberate coordination LUE Sensation: WNL LUE Coordination: decreased fine motor  Lower Extremity Assessment: Defer to PT evaluation RLE Deficits / Details: Grossly ~2+/5 DF, 4/5 knee extension, 3/5 hip flexion RLE Sensation:  WNL RLE Coordination: decreased fine motor    ADLs  Overall ADL's : Needs assistance/impaired Eating/Feeding: Independent, Sitting Grooming: Minimal assistance, Standing Upper Body Bathing: Minimal assistance, Sitting Lower Body Bathing: Moderate assistance, Sit to/from stand Upper Body Dressing : Minimal assistance, Sitting Lower Body Dressing: Moderate assistance, Sit to/from stand Toilet Transfer: Moderate assistance, Ambulation, Rolling walker (2 wheels) Toileting- Clothing Manipulation and Hygiene: Minimal assistance, Sit to/from stand Functional mobility during ADLs: Moderate assistance, Rolling walker (2 wheels) General ADL Comments: pt with posterior lean in sitting. requires cues and physical assist for all ADLs. impaired co    Mobility  Overal bed mobility: Needs Assistance Bed Mobility: Supine to Sit Rolling: Min assist Sidelying to sit: Mod assist, HOB elevated Supine to sit: Mod assist General bed mobility comments: Pt reaching out for therapist's hand. Cues to use bed rail however pt not able to coordinate trunk flexion. Assist provided for trunk elevation and  scooting forward with bed pad.    Transfers  Overall transfer level: Needs assistance Equipment used: Rolling walker (2 wheels) Transfers: Sit to/from Stand Sit to Stand: Mod assist Bed to/from chair/wheelchair/BSC transfer type:: Step pivot Step pivot transfers: Mod assist General transfer comment: Assist for power up to full stand and increased time/assist required to gain/maintain standing balance. Heavy posterior lean.    Ambulation / Gait / Stairs / Wheelchair Mobility  Ambulation/Gait Ambulation/Gait assistance: Min assist, Mod assist Gait Distance (Feet): 150 Feet Assistive device: Rolling walker (2 wheels) Gait Pattern/deviations: Step-through pattern, Decreased stride length, Narrow base of support, Scissoring General Gait Details: Slow and unsteady with up to mod assist required for balance support  and walker management. Pt frequently trying to take hands off walker to adjust mask. As pt fatigued noted increased RLE flexion and difficulty advancing by the time he reached the end of his distance. Gait velocity: decreased Gait velocity interpretation: <1.8 ft/sec, indicate of risk for recurrent falls    Posture / Balance Dynamic Sitting Balance Sitting balance - Comments: posterior lean, requierd physical assist the majority of the time in sitting Balance Overall balance assessment: Needs assistance, History of Falls Sitting-balance support: Feet supported, Bilateral upper extremity supported Sitting balance-Leahy Scale: Poor Sitting balance - Comments: posterior lean, requierd physical assist the majority of the time in sitting Postural control: Posterior lean Standing balance support: Bilateral upper extremity supported Standing balance-Leahy Scale: Poor Standing balance comment: required external support    Special needs/care consideration {Special Care Needs/Care Considerations:304600603}   Previous Home Environment (from acute therapy documentation) Living Arrangements: Spouse/significant other  Lives With: Spouse Available Help at Discharge: Family, Available 24 hours/day Type of Home: House Home Layout: Two level, Able to live on main level with bedroom/bathroom Alternate Level Stairs-Number of Steps: 1 flight Home Access: Stairs to enter Entrance Stairs-Rails: None Entrance Stairs-Number of Steps: 4 Bathroom Shower/Tub: Gaffer, Chiropodist: Standard Bathroom Accessibility: Yes How Accessible: Accessible via walker Home Care Services: No Additional Comments: wife is unwell, unable to physically assist pt.  Discharge Living Setting Plans for Discharge Living Setting: Patient's home Type of Home at Discharge: House Discharge Home Layout: Two level, Able to live on main level with bedroom/bathroom Discharge Home Access: Stairs to  enter Entrance Stairs-Rails: None Entrance Stairs-Number of Steps: 4 Discharge Bathroom Shower/Tub: Walk-in shower, Tub/shower unit Discharge Bathroom Toilet: Standard Discharge Bathroom Accessibility: Yes How Accessible: Accessible via walker Does the patient have any problems obtaining your medications?: No  Social/Family/Support Systems Patient Roles: Spouse Anticipated Caregiver: Lenna Sciara, daughter and other family Anticipated Caregiver's Contact Information: [phone] Caregiver Availability: 24/7 Discharge Plan Discussed with Primary Caregiver: Yes Is Caregiver In Agreement with Plan?: Yes Does Caregiver/Family have Issues with Lodging/Transportation while Pt is in Rehab?: No  Goals Patient/Family Goal for Rehab: Mod I-Supervision: PT/OT Expected length of stay: *** Pt/Family Agrees to Admission and willing to participate: Yes Program Orientation Provided & Reviewed with Pt/Caregiver Including Roles  & Responsibilities: Yes  Decrease burden of Care through IP rehab admission: NA  Possible need for SNF placement upon discharge: Not anticipated  Patient Condition: {PATIENT'S CONDITION:22832}  Preadmission Screen Completed By:  Bethel Born, 10/01/2021 5:24 PM ______________________________________________________________________   Discussed status with Dr. Marland Kitchen on *** at *** and received approval for admission today.  Admission Coordinator:  Bethel Born, CCC-SLP, time ***/Date ***   Assessment/Plan: Diagnosis: Does the need for close, 24 hr/day Medical supervision in concert with the patient's rehab  needs make it unreasonable for this patient to be served in a less intensive setting? {yes_no_potentially:3041433} Co-Morbidities requiring supervision/potential complications: *** Due to {due FQ:4210312}, does the patient require 24 hr/day rehab nursing? {yes_no_potentially:3041433} Does the patient require coordinated care of a physician, rehab  nurse, PT, OT, and SLP to address physical and functional deficits in the context of the above medical diagnosis(es)? {yes_no_potentially:3041433} Addressing deficits in the following areas: {deficits:3041436} Can the patient actively participate in an intensive therapy program of at least 3 hrs of therapy 5 days a week? {yes_no_potentially:3041433} The potential for patient to make measurable gains while on inpatient rehab is {potential:3041437} Anticipated functional outcomes upon discharge from inpatient rehab: {functional outcomes:304600100} PT, {functional outcomes:304600100} OT, {functional outcomes:304600100} SLP Estimated rehab length of stay to reach the above functional goals is: *** Anticipated discharge destination: {anticipated dc setting:21604} 10. Overall Rehab/Functional Prognosis: {potential:3041437}   MD Signature: ***

## 2021-10-01 NOTE — Assessment & Plan Note (Addendum)
Patient was cleared by SLP for swallow yesterday. However, today the patient has been choking and possibly aspirating on his meals. He has been made NPO and SLP has been re-consulted. CXR was checked and no acute cardiopulmonary process was identified.  Repeat SLP eval was ordered today after patient's change in neurological status. MBS is planned.  Elevation of WBC on CBC from 10/02/2021 is possibly a consequence of inflammation due to aspiration pneumonitis. Other than elevated WBC the patient does not show any signs of pneumonia. He is cognitively intact. He is saturating 98% on room air without complaints of cough. Will follow CBC and check procalcitonin and recheck CXR.

## 2021-10-01 NOTE — Progress Notes (Addendum)
PROGRESS NOTE  BYRD RUSHLOW ZHG:[id number] DOB: [birth year] DOA: 09/29/2021 PCP: Mayra Neer, MD  Brief History   Clarence Myers is a 88 y.o. male with medical history significant of HTN, CAD, HLD, glaucoma who presents for evaluation of generalized weakness with ataxia and slurred speech.  He reports that he began to have weakness and some ataxia last night and when he woke up this morning he had garbled slurred speech.  He was recently started on acetazolamide for his glaucoma by his ophthalmologist was told that it could cause some continue in his hands and feet so when the initial symptoms started he attributed it to that.  When his speech was slurred this morning the family became more concerned and as it persisted throughout the day despite having stopped the medication 24 hours earlier they decided to come for evaluation.  He did not have any loss of consciousness.  He did have a fall last night and again this morning but had no head trauma and he denies any headache.  He reports he has been off balance and has had generalized weakness when he tries to walk but it cannot attribute to one side or the other.  States he has not had any fever or chills.  He denies any chest pain, palpitations, shortness of breath, urinary symptoms, abdominal pain, nausea vomiting or diarrhea. With his wife.  Denies tobacco, alcohol, illicit drug use.   History Schill has been hemodynamically stable in the emergency room.  He was found to have mild hypokalemia and was given oral potassium supplementation.  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  Hospitalist service was asked to admit for further stroke work-up and neurology was consulted by the ER physician  Consultants  Neurology  Procedures    Antibiotics   Anti-infectives (From admission, onward)    None      Subjective  The patient is resting comfortably. No new complaints.  Objective    Vitals:  Vitals:   10/01/21 1241 10/01/21 1600  BP: (!) 141/83 (!) 144/85  Pulse:    Resp:    Temp:    SpO2: 96% 94%    Exam:  Constitutional:  The patient is awake, alert, and oriented x 3. No acute distress. Respiratory:  No increased work of breathing. No wheezes, rales, or rhonchi No tactile fremitus Cardiovascular:  Regular rate and rhythm No murmurs, ectopy, or gallups. No lateral PMI. No thrills. Abdomen:  Abdomen is soft, non-tender, non-distended No hernias, masses, or organomegaly Normoactive bowel sounds.  Musculoskeletal:  No cyanosis, clubbing, or edema Skin:  No rashes, lesions, ulcers palpation of skin: no induration or nodules Neurologic:  Slurred speech Sensation all 4 extremities intact Psychiatric:  Mental status Mood, affect appropriate Orientation to person, place, time  judgment and insight appear intact  I have personally reviewed the following:   Today's Data  Vitals  Lab Data  BMP CBC  Micro Data    Imaging  CT head MRI brain  Cardiology Data  Echocardiogram is pending.  Other Data    Scheduled Meds:  aspirin EC  81 mg Oral Daily   clopidogrel  75 mg Oral Daily   cycloSPORINE  1 drop Both Eyes BID   latanoprost  1 drop Both Eyes QHS   multivitamin  1 tablet Oral Daily   omega-3 acid ethyl esters  1 g Oral Daily   pantoprazole  40 mg Oral Daily   simvastatin  40  mg Oral Daily   Continuous Infusions:    Principal Problem:   CVA (cerebral vascular accident) ([place]) Active Problems:   CAD (coronary artery disease)   Hyperlipidemia   Essential hypertension   Hypokalemia   Slurred speech   LOS: 2 days   A & P  Assessment and Plan: * CVA (cerebral vascular accident) ([place])- (present on admission) Clarence Myers admitted to medical telemetry floor for TIA/CVA.  MRI has confirmed the presence of small acute infarcts in the corpus callosum and pons. Will evaluate carotids for large vessel  occlusion/stenosis. Echocardiogram demonstrates LVEF of 65-70%. Normal function, no regional wall motion abnormalities. There is Grade I diastolic dysfunction. RV systolic function is normal. PASP is normal. There is mild to moderate aortic valve stenosis. Hypertension will gradually be normalized over 4-5 days as recommended by Dr. Tilden Dome. Antiplatelet therapy with aspirin daily. Continue statin therapy.  Check lipid panel.  Consult ST/PT in am. I appreciate neurology assistance. The patient has been evaluated by PT/OT. The recommendation has been made for CIR. Rehab has been consulted to evaluated.    Slurred speech- (present on admission) Patient was cleared by SLP for swallow yesterday. However, today the patient has been choking and possibly aspirating on his meals. He has been made NPO and SLP has been re-consulted. CXR was checked and no acute cardiopulmonary process was identified.  Hypokalemia- (present on admission) Supplement and monitor potassium as necessary.  Essential hypertension Permissive Hypertension with slow normalization of pressures.  Monitor BP  Hyperlipidemia- (present on admission) Continue statin  CAD (coronary artery disease)- (present on admission) Continue statin. Metoprolol will be resumed after permissive hypertension period ends   I have seen and examined this patient myself. I have spent 34 minutes in his evaluation and care.  DVT prophylaxis: SCD's Code Status: Full Code Family Communication: None available Disposition Plan: tbd    Telecia Larocque, DO [facility] Direct contact: see [email or web address]  7PM-7AM contact night coverage as above 10/01/2021, 4:23 PM  LOS: 1 day   ADDENDUM: The patient has had tachycardia and elevated respiratory rate since choking episode earlier this am. The patient is also complaining of severe back pain. He has a heating pad on his back. I have ordered a one time dose of dilaudid. EKG has been ordered to investigate HR  of 120-130. SLP has re-evaluated the patient. He is now on  full liquid diet. Family was in room. All questions answered to the best of my ability.

## 2021-10-01 NOTE — Progress Notes (Signed)
Patient's daughter called out stating her father was choking. Patient ate lunch with no difficulty and then stared to drink some milk. Patient coughed which caused him to vomit. Notified MD. Patient placed on 2L due to O2 sats being 88%

## 2021-10-01 NOTE — Progress Notes (Signed)
Patients heart rate increased 120-130. Patient is also heavy breathing. Sats 93% on 2.5L Paged MD.

## 2021-10-01 NOTE — Progress Notes (Signed)
°  Echocardiogram 2D Echocardiogram has been performed.  Johny Chess 10/01/2021, 2:47 PM

## 2021-10-01 NOTE — Progress Notes (Signed)
Physical Therapy Treatment Patient Details Name: Clarence Myers MRN: [id number] DOB: [birth year] Today's Date: 10/01/2021   History of Present Illness Patient is a 88 y/o male who presents on 09/29/21 with slurred speech, weakness and difficulty walking; s/p 2 falls at home. 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 Found to have hypokalemia as well. PMH includes CAD, low back strain.    PT Comments    Pt progressing towards physical therapy goals. Pt required increased assistance for balance and RLE appeared to fatigue quickly with gait training. Pt motivated and overall demonstrated a good rehab effort. AIR continues to be the most appropriate d/c disposition at this time. Will continue to follow.    Recommendations for follow up therapy are one component of a multi-disciplinary discharge planning process, led by the attending physician.  Recommendations may be updated based on patient status, additional functional criteria and insurance authorization.  Follow Up Recommendations  Acute inpatient rehab (3hours/day)     Assistance Recommended at Discharge Frequent or constant Supervision/Assistance  Patient can return home with the following A little help with walking and/or transfers;A little help with bathing/dressing/bathroom;Assistance with cooking/housework;Assist for transportation;Help with stairs or ramp for entrance   Equipment Recommendations  None recommended by PT    Recommendations for Other Services Rehab consult     Precautions / Restrictions Precautions Precautions: Fall Precaution Comments: hx of 2 recent falls Restrictions Weight Bearing Restrictions: No     Mobility  Bed Mobility Overal bed mobility: Needs Assistance Bed Mobility: Supine to Sit     Supine to sit: Mod assist     General bed mobility comments: Pt reaching out for therapist's hand. Cues to use bed rail however pt not able to coordinate trunk flexion. Assist provided for trunk  elevation and scooting forward with bed pad.    Transfers Overall transfer level: Needs assistance Equipment used: Rolling walker (2 wheels) Transfers: Sit to/from Stand Sit to Stand: Mod assist           General transfer comment: Assist for power up to full stand and increased time/assist required to gain/maintain standing balance. Heavy posterior lean.    Ambulation/Gait Ambulation/Gait assistance: Min assist, Mod assist Gait Distance (Feet): 150 Feet Assistive device: Rolling walker (2 wheels) Gait Pattern/deviations: Step-through pattern, Decreased stride length, Narrow base of support, Scissoring Gait velocity: decreased Gait velocity interpretation: <1.8 ft/sec, indicate of risk for recurrent falls   General Gait Details: Slow and unsteady with up to mod assist required for balance support and walker management. Pt frequently trying to take hands off walker to adjust mask. As pt fatigued noted increased RLE flexion and difficulty advancing by the time he reached the end of his distance.   Stairs             Wheelchair Mobility    Modified Rankin (Stroke Patients Only) Modified Rankin (Stroke Patients Only) Pre-Morbid Rankin Score: Moderate disability Modified Rankin: Moderately severe disability     Balance Overall balance assessment: Needs assistance, History of Falls Sitting-balance support: Feet supported, Bilateral upper extremity supported Sitting balance-Leahy Scale: Poor Sitting balance - Comments: posterior lean, requierd physical assist the majority of the time in sitting Postural control: Posterior lean Standing balance support: Bilateral upper extremity supported Standing balance-Leahy Scale: Poor Standing balance comment: required external support                            Cognition Arousal/Alertness: Awake/alert Behavior During Therapy: [facility] for  tasks assessed/performed Overall Cognitive Status: Impaired/Different from  baseline Area of Impairment: Attention, Memory, Following commands, Safety/judgement, Problem solving                   Current Attention Level: Selective Memory: Decreased short-term memory Following Commands: Follows one step commands with increased time Safety/Judgement: Decreased awareness of safety, Decreased awareness of deficits   Problem Solving: Slow processing, Difficulty sequencing, Requires verbal cues          Exercises      General Comments        Pertinent Vitals/Pain Pain Assessment Pain Assessment: No/denies pain    Home Living                          Prior Function            PT Goals (current goals can now be found in the care plan section) Acute Rehab PT Goals Patient Stated Goal: to go home PT Goal Formulation: With patient Time For Goal Achievement: 10/14/21 Potential to Achieve Goals: Good Progress towards PT goals: Progressing toward goals    Frequency    Min 4X/week      PT Plan Current plan remains appropriate    Co-evaluation              AM-PAC PT "6 Clicks" Mobility   Outcome Measure  Help needed turning from your back to your side while in a flat bed without using bedrails?: A Little Help needed moving from lying on your back to sitting on the side of a flat bed without using bedrails?: A Lot Help needed moving to and from a bed to a chair (including a wheelchair)?: A Lot Help needed standing up from a chair using your arms (e.g., wheelchair or bedside chair)?: A Lot Help needed to walk in hospital room?: A Lot Help needed climbing 3-5 steps with a railing? : A Lot 6 Click Score: 13    End of Session Equipment Utilized During Treatment: Gait belt Activity Tolerance: Patient tolerated treatment well Patient left: in bed;with call bell/phone within reach;with bed alarm set;with nursing/sitter in room Nurse Communication: Mobility status PT Visit Diagnosis: History of falling (Z91.81);Other  abnormalities of gait and mobility (R26.89);Unsteadiness on feet (R26.81)     Time: 5361-4431 PT Time Calculation (min) (ACUTE ONLY): 25 min  Charges:  $Gait Training: 23-37 mins                     Rolinda Roan, PT, DPT Acute Rehabilitation Services Pager: [phone] Office: [phone]    Thelma Comp 10/01/2021, 12:27 PM

## 2021-10-02 ENCOUNTER — Inpatient Hospital Stay (HOSPITAL_COMMUNITY): Payer: PPO

## 2021-10-02 LAB — CBC WITH DIFFERENTIAL/PLATELET
Abs Immature Granulocytes: 0.14 10*3/uL — ABNORMAL HIGH (ref 0.00–0.07)
Basophils Absolute: 0.1 10*3/uL (ref 0.0–0.1)
Basophils Relative: 0 %
Eosinophils Absolute: 0 10*3/uL (ref 0.0–0.5)
Eosinophils Relative: 0 %
HCT: 37.4 % — ABNORMAL LOW (ref 39.0–52.0)
Hemoglobin: 12.6 g/dL — ABNORMAL LOW (ref 13.0–17.0)
Immature Granulocytes: 1 %
Lymphocytes Relative: 7 %
Lymphs Abs: 1.7 10*3/uL (ref 0.7–4.0)
MCH: 31.2 pg (ref 26.0–34.0)
MCHC: 33.7 g/dL (ref 30.0–36.0)
MCV: 92.6 fL (ref 80.0–100.0)
Monocytes Absolute: 1.4 10*3/uL — ABNORMAL HIGH (ref 0.1–1.0)
Monocytes Relative: 6 %
Neutro Abs: 20.5 10*3/uL — ABNORMAL HIGH (ref 1.7–7.7)
Neutrophils Relative %: 86 %
Platelets: 265 10*3/uL (ref 150–400)
RBC: 4.04 MIL/uL — ABNORMAL LOW (ref 4.22–5.81)
RDW: 13.3 % (ref 11.5–15.5)
WBC: 23.9 10*3/uL — ABNORMAL HIGH (ref 4.0–10.5)
nRBC: 0 % (ref 0.0–0.2)

## 2021-10-02 LAB — BASIC METABOLIC PANEL
Anion gap: 7 (ref 5–15)
BUN: 30 mg/dL — ABNORMAL HIGH (ref 8–23)
CO2: 18 mmol/L — ABNORMAL LOW (ref 22–32)
Calcium: 8.6 mg/dL — ABNORMAL LOW (ref 8.9–10.3)
Chloride: 111 mmol/L (ref 98–111)
Creatinine, Ser: 1.18 mg/dL (ref 0.61–1.24)
GFR, Estimated: >60 mL/min (ref >60)
Glucose, Bld: 145 mg/dL — ABNORMAL HIGH (ref 70–99)
Potassium: 3.7 mmol/L (ref 3.5–5.1)
Sodium: 136 mmol/L (ref 135–145)

## 2021-10-02 IMAGING — CT CT HEAD W/O CM
5 of 6 series · 17 of 47 positions shown, 18 images · non-contrast
Comparison: CT head and MR head [DATE], CTA head [DATE]

CLINICAL DATA: Stroke, follow-up, new symptoms beginning this
morning



[Series 3: head bone · axial · 0.44mm/px · z∈[-8,+112]mm · 7 of 86 slices shown]
[im 9/86  bone]
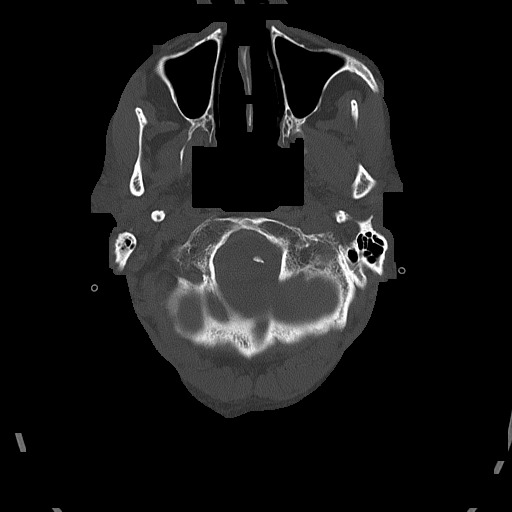
[im 18/86  bone]
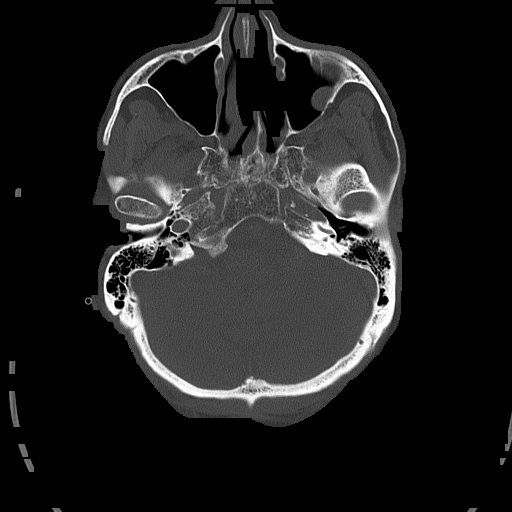
[im 26/86  bone]
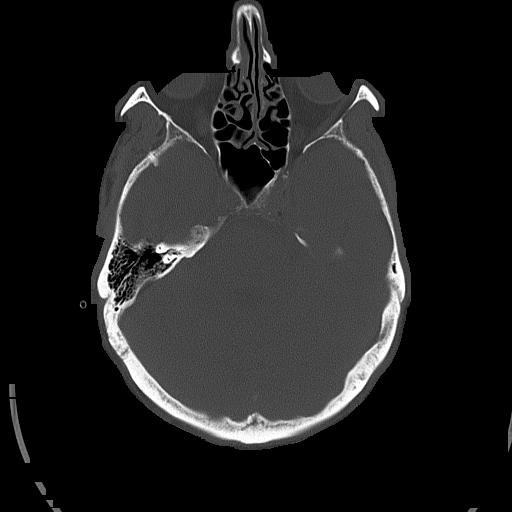
[im 35/86  bone]
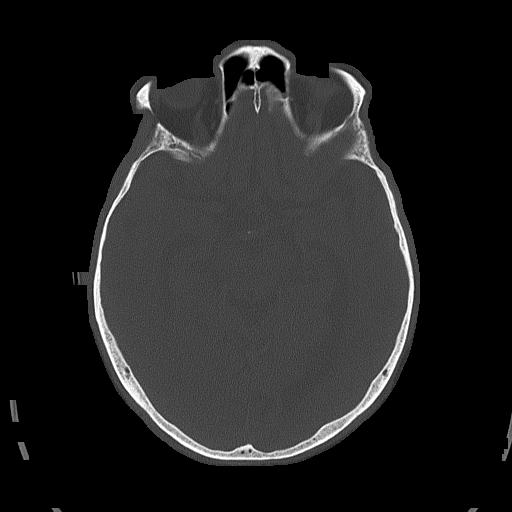
[im 52/86  bone]
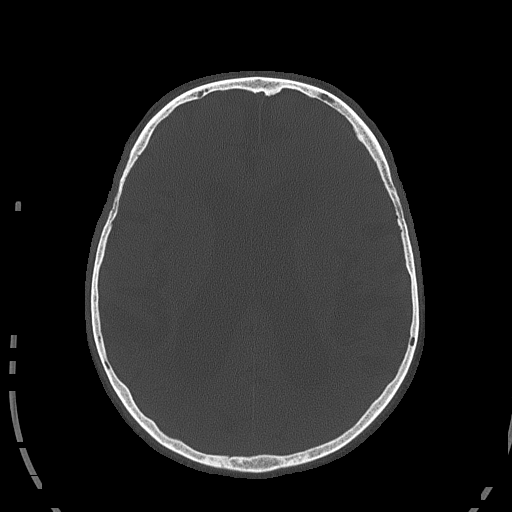
[im 60/86  bone]
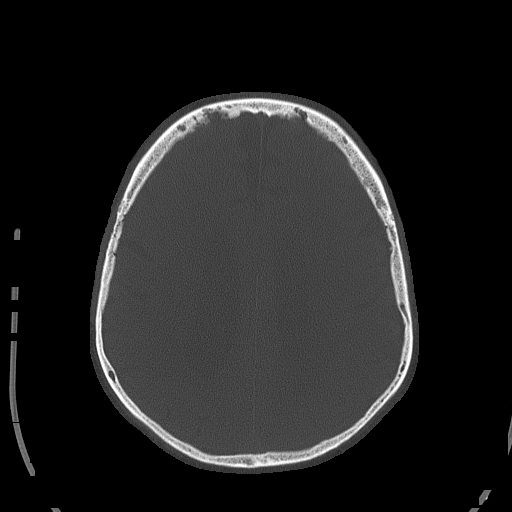
[im 69/86  bone]
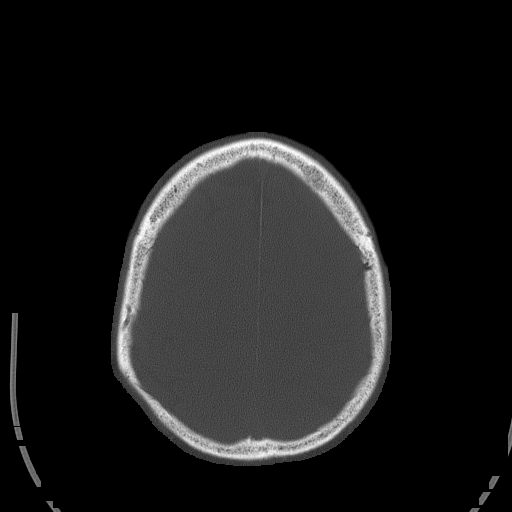

[Series 4: head without · axial · non-contrast · 0.44mm/px · z∈[+31,+86]mm · 2 of 35 slices shown, 3 images]
[im 12/35  brain]
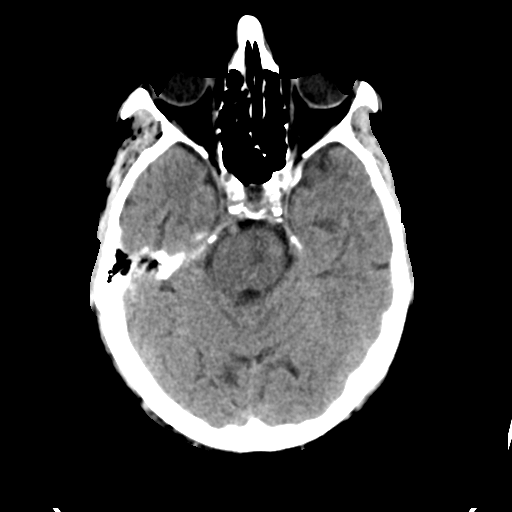
[im 12/35  bone]
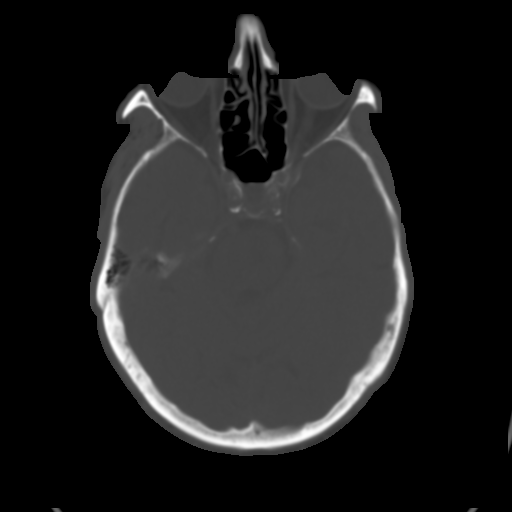
[im 23/35  brain]
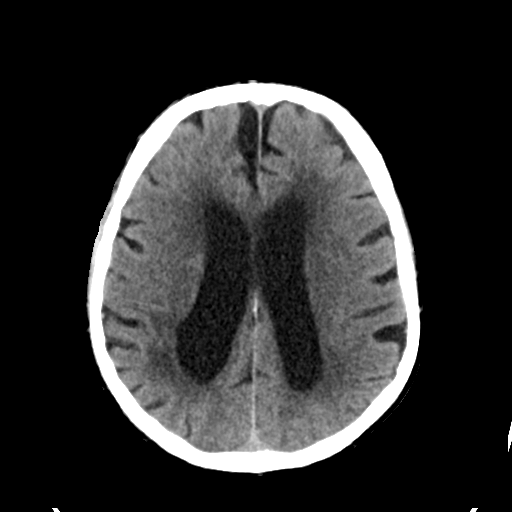

[Series 5: head without cor · coronal · non-contrast · 0.35mm/px · 3 of 72 slices shown]
[im 25/72  brain]
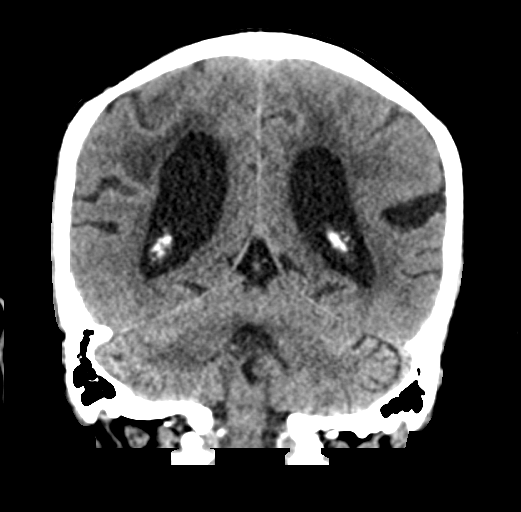
[im 32/72  brain]
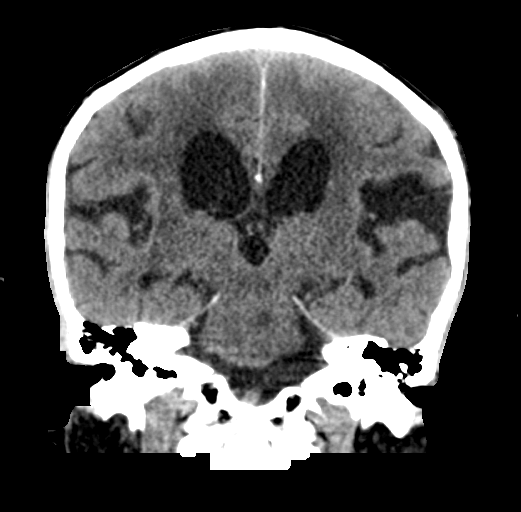
[im 40/72  brain]
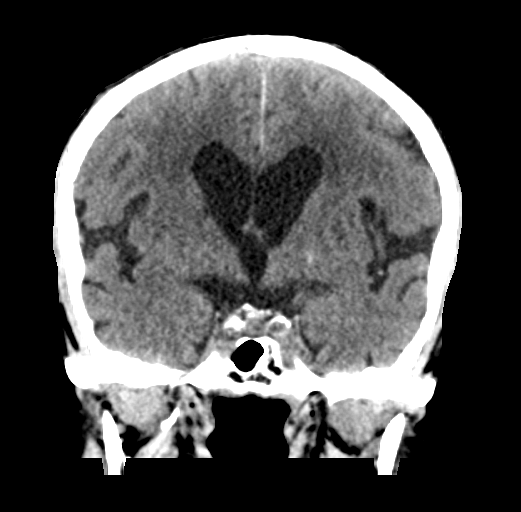

[Series 6: head without sag · sagittal · non-contrast · 0.33mm/px · 3 of 56 slices shown]
[im 19/56  brain]
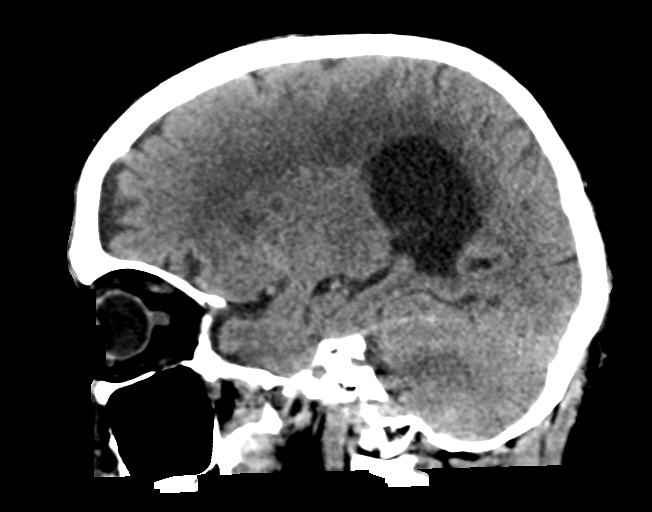
[im 28/56  brain]
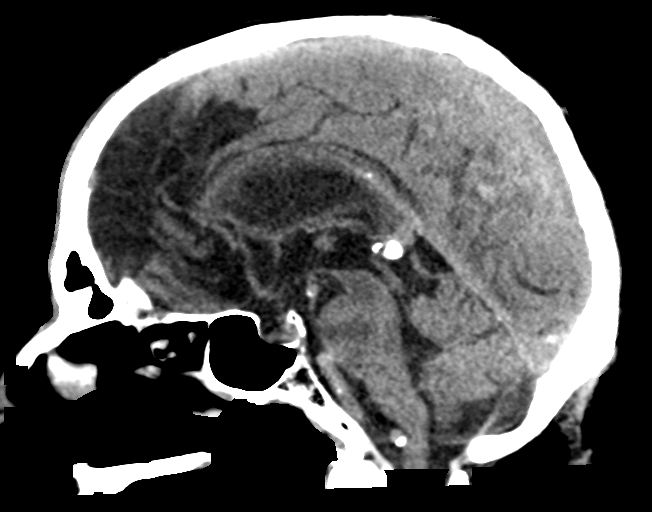
[im 37/56  brain]
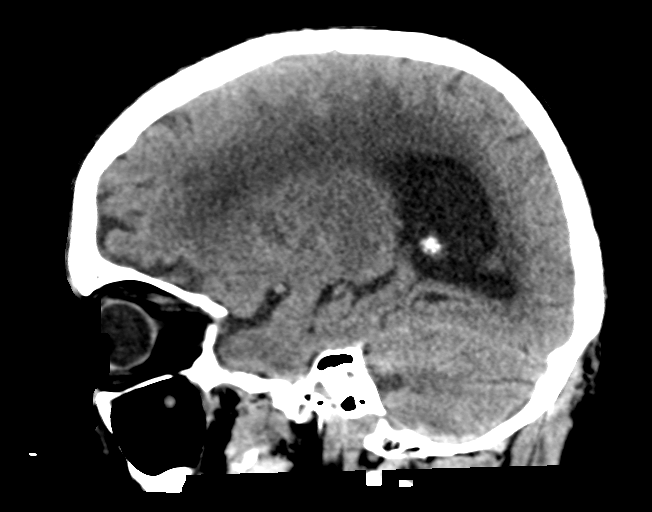

[Series 7: head without ax · axial · non-contrast · 0.36mm/px · z∈[+45,+95]mm · 2 of 35 slices shown]
[im 12/35  brain]
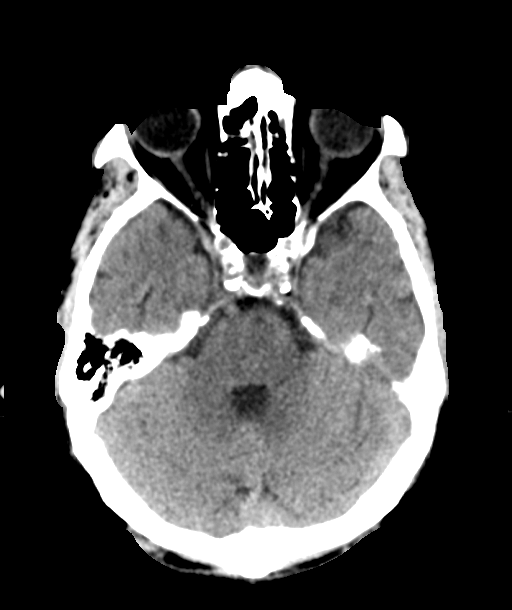
[im 23/35  brain]
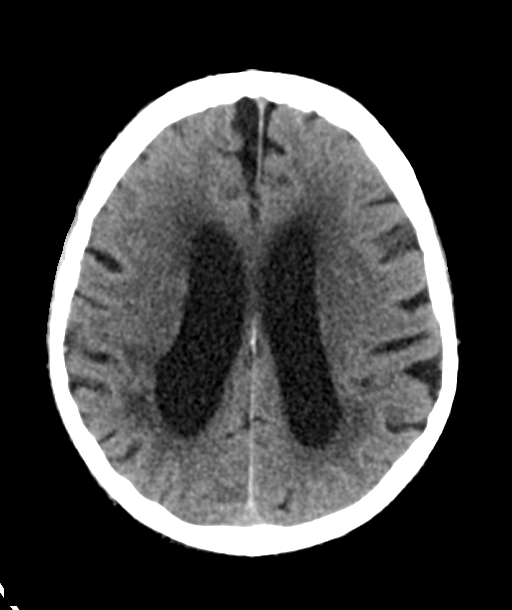

[17 of 47 positions shown; findings below may reference images not displayed]

FINDINGS: Brain: There are small foci of hypodensity in the left pons and left
genu of the corpus callosum consistent with small evolving infarcts
as seen on prior MRI. There is no associated hemorrhage.

There is no evidence of new acute infarct. There is no acute
intracranial hemorrhage or extra-axial fluid collection.

Global parenchymal volume loss with prominence of the ventricular
system and extra-axial CSF spaces is unchanged. The ventricles are
stable in size. Patchy and confluent hypodensity in the subcortical
and periventricular white matter is again seen likely reflecting
sequela of chronic white matter microangiopathy.

There is no mass lesion.  There is no midline shift.

Vascular: There is calcification of the bilateral cavernous ICAs.

Skull: Normal. Negative for fracture or focal lesion.

Sinuses/Orbits: There is a small mucous retention cyst in the left
maxillary sinus. Bilateral lens implants are in place. The globes
and orbits are otherwise unremarkable.

Other: None.
IMPRESSION: 1. Small evolving infarcts in the left pons and genu of the corpus
callosum.
2. No evidence of new acute intracranial pathology.

## 2021-10-02 MED ORDER — SODIUM CHLORIDE 0.9 % IV SOLN
INTRAVENOUS | Status: DC
  Administered 2021-10-04: 990 mL via INTRAVENOUS
  Administered 2021-10-05: 1000 mL via INTRAVENOUS

## 2021-10-02 MED ORDER — SODIUM CHLORIDE 0.9 % IV BOLUS
500.0000 mL | Freq: Once | INTRAVENOUS | Status: AC
  Administered 2021-10-02: 500 mL via INTRAVENOUS

## 2021-10-02 MED ORDER — LIP MEDEX EX OINT
TOPICAL_OINTMENT | CUTANEOUS | Status: DC | PRN
  Administered 2021-10-03: 1 via TOPICAL
  Filled 2021-10-02: qty 7

## 2021-10-02 NOTE — Progress Notes (Signed)
PT Cancellation Note  Patient Details Name: Clarence Myers MRN: [id number] DOB: Dec 21, 1933   Cancelled Treatment:    Reason Eval/Treat Not Completed: Patient not medically ready. Code stroke called on patient as he experienced a neurologic change. Pt going down for stat CT. Acute PT to return as able, as appropriate to assess pt mobility.  Kittie Plater, PT, DPT Acute Rehabilitation Services Pager #: [phone] Office #: [phone]    Berline Lopes 10/02/2021, 11:35 AM

## 2021-10-02 NOTE — Progress Notes (Signed)
OT Cancellation Note  Patient Details Name: Clarence Myers MRN: [id number] DOB: [birth year]   Cancelled Treatment:    Reason Eval/Treat Not Completed: Patient not medically ready (Code stroke called on patient as he experienced a neurologic change. Pt going down for stat CT. Acute OT to return as able, as appropriate.)  Kahlia Lagunes A Janeisha Ryle 10/02/2021, 11:40 AM

## 2021-10-02 NOTE — Progress Notes (Signed)
SLP Cancellation Note  Patient Details Name: Clarence Myers MRN: [id number] DOB: 31-Oct-1933   Cancelled treatment:       Reason Eval/Treat Not Completed: Medical issues which prohibited therapy. MBS cancelled due to acute change in status, pt going to CT scan   Ashlen Kiger, Katherene Ponto 10/02/2021, 12:22 PM

## 2021-10-02 NOTE — Progress Notes (Signed)
°  Transition of Care [facility]) Screening Note   Patient Details  Name: RISHITH SIDDOWAY Date of Birth: 1934-03-25   Transition of [facility]) CM/SW Contact:    Pollie Friar, RN Phone Number: 10/02/2021, 12:43 PM    Transition of Care Department [facility]) has reviewed patient. We will continue to monitor patient advancement through interdisciplinary progression rounds. If new patient transition needs arise, please place a TOC consult.

## 2021-10-02 NOTE — Significant Event (Signed)
Rapid Response Event Note   Reason for Call :  Patient unable to move his right arm or leg.  Initial Focused Assessment:  Per family he was able to move his right arm and leg with mild weakness yesterday.  Per review of chart he had a drift of his arm an leg at 0422 (NIHSS 5)  Upon my assessment he is lying in bed with his HOB > 30degrees.  He is alert and oriented.  He has no movement of his right arm and leg.  NIHSS 11: mild facial droop, Right arm and leg weakness, mild decreased sensory of right,  dysarthric  BP  144/63  HR 93  RR 18  O2 sat 97% on 2L [place]  Dr Leonie Man at bedside  Interventions:  Stat head CT 500 cc NS bolus  Plan of Care:     Event Summary:   MD Notified: Dr Benny Lennert and Dr Leonie Man Call Time: [place] Time: 9675 End Time: [place]  Raliegh Ip, RN

## 2021-10-02 NOTE — Care Management Important Message (Signed)
Important Message  Patient Details  Name: Clarence Myers MRN: [id number] Date of Birth: 13-Oct-1933   Medicare Important Message Given:  Yes     Orbie Pyo 10/02/2021, 3:39 PM

## 2021-10-02 NOTE — Progress Notes (Signed)
PROGRESS NOTE  ROSEVELT LUU IOX:[id number] DOB: [birth year] DOA: 09/29/2021 PCP: Mayra Neer, MD  Brief History   Clarence Myers is a 88 y.o. male with medical history significant of HTN, CAD, HLD, glaucoma who presents for evaluation of generalized weakness with ataxia and slurred speech.  He reports that he began to have weakness and some ataxia last night and when he woke up this morning he had garbled slurred speech.  He was recently started on acetazolamide for his glaucoma by his ophthalmologist was told that it could cause some continue in his hands and feet so when the initial symptoms started he attributed it to that.  When his speech was slurred this morning the family became more concerned and as it persisted throughout the day despite having stopped the medication 24 hours earlier they decided to come for evaluation.  He did not have any loss of consciousness.  He did have a fall last night and again this morning but had no head trauma and he denies any headache.  He reports he has been off balance and has had generalized weakness when he tries to walk but it cannot attribute to one side or the other.  States he has not had any fever or chills.  He denies any chest pain, palpitations, shortness of breath, urinary symptoms, abdominal pain, nausea vomiting or diarrhea. With his wife.  Denies tobacco, alcohol, illicit drug use.   History [place] has been hemodynamically stable in the emergency room.  He was found to have mild hypokalemia and was given oral potassium supplementation.  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  Hospitalist service was asked to admit for further stroke work-up and neurology was consulted by the ER physician.  MRI has demonstrated acute pontine and corpus callosum infarcts. The patient was started on plavix and ASA. Stroke work up has been unremarkable. Today the patient had a change in neurological status  with new weakness or his right upper extremity. Stat CT head was negative for change. Neurology evaluated the patient. Will continue to monitor for change. SLP has been asked to re-evaluate again. The patient has been made NPO.  Consultants  Neurology  Procedures    Antibiotics   Anti-infectives (From admission, onward)    None      Subjective  The patient is resting comfortably. He has no use of his right upper extremity at all. Family is at bedside. His speech has deteriorated.  Objective   Vitals:  Vitals:   10/02/21 1243 10/02/21 1455  BP:  (!) 111/59  Pulse: 79 80  Resp:  18  Temp:  98 F (36.7 C)  SpO2: 96% 98%    Exam:  Constitutional:  The patient is awake, alert, and oriented x 3. Mild distress from new deficit. Respiratory:  No increased work of breathing. No wheezes, rales, or rhonchi No tactile fremitus Cardiovascular:  Regular rate and rhythm No murmurs, ectopy, or gallups. No lateral PMI. No thrills. Abdomen:  Abdomen is soft, non-tender, non-distended No hernias, masses, or organomegaly Normoactive bowel sounds.  Musculoskeletal:  No cyanosis, clubbing, or edema Skin:  No rashes, lesions, ulcers palpation of skin: no induration or nodules Neurologic:  Slurred speech Sensation all 4 extremities intact Weakness of right upper and lower extremities. Psychiatric:  Mental status Mood, affect appropriate Orientation to person, place, time  judgment and insight appear intact  I have personally reviewed the following:   Today's Data  Vitals  Lab Data  BMP CBC  Micro Data    Imaging  CT head MRI brain Repeat CT head  Cardiology Data  Echocardiogram  Other Data    Scheduled Meds:  aspirin EC  81 mg Oral Daily   clopidogrel  75 mg Oral Daily   cycloSPORINE  1 drop Both Eyes BID   latanoprost  1 drop Both Eyes QHS   multivitamin  1 tablet Oral Daily   omega-3 acid ethyl esters  1 g Oral Daily   pantoprazole  40 mg Oral  Daily   simvastatin  40 mg Oral Daily   Continuous Infusions:  sodium chloride 70 mL/hr at 10/02/21 1712     Principal Problem:   CVA (cerebral vascular accident) ([place]) Active Problems:   CAD (coronary artery disease)   Hyperlipidemia   Essential hypertension   Hypokalemia   Slurred speech   LOS: 3 days   A & P  Assessment and Plan: * CVA (cerebral vascular accident) ([place])- (present on admission) Clarence Myers admitted to medical telemetry floor for TIA/CVA.  MRI has confirmed the presence of small acute infarcts in the corpus callosum and pons. Will evaluate carotids for large vessel occlusion/stenosis. Echocardiogram demonstrates LVEF of 65-70%. Normal function, no regional wall motion abnormalities. There is Grade I diastolic dysfunction. RV systolic function is normal. PASP is normal. There is mild to moderate aortic valve stenosis. Hypertension will gradually be normalized over 4-5 days as recommended by Dr. Tilden Dome. Antiplatelet therapy with aspirin daily. Continue statin therapy.  Check lipid panel.  Consult ST/PT in am. The patient has been evaluated by PT/OT. The recommendation has been made for CIR. Rehab has been consulted to evaluated.   Today the patient has lost the use of his right upper extremity. Stat CT head was obtained. The patient was seen by Dr. Tilden Dome. Will continue to monitor.  I appreciate neurology assistance.   Slurred speech- (present on admission) Patient was cleared by SLP for swallow yesterday. However, today the patient has been choking and possibly aspirating on his meals. He has been made NPO and SLP has been re-consulted. CXR was checked and no acute cardiopulmonary process was identified.  Repeat SLP eval was ordered today after patient's change in neurological status. MBS is planned.  Hypokalemia- (present on admission) Supplement and monitor potassium as necessary.  Essential hypertension Permissive Hypertension with slow normalization of  pressures.  Monitor BP. Blood pressures are labile, but are in the normal - low normal range. Monitor.  Hyperlipidemia- (present on admission) Continue statin  CAD (coronary artery disease)- (present on admission) Continue statin. Metoprolol will be resumed after permissive hypertension period ends   I have seen and examined this patient myself. I have spent 34 minutes in his evaluation and care.  DVT prophylaxis: SCD's Code Status: Full Code Family Communication: None available Disposition Plan: tbd    Camden Mazzaferro, DO [facility] Direct contact: see [email or web address]  7PM-7AM contact night coverage as above 10/02/2021, 5:21 PM  LOS: 1 day

## 2021-10-03 LAB — PROCALCITONIN: Procalcitonin: 0.61 ng/mL

## 2021-10-03 NOTE — Progress Notes (Signed)
STROKE TEAM PROGRESS NOTE   INTERVAL HISTORY Patient continues to have moderate dysarthria and significant right hemiplegia but does not appear to show some right elbow extension trace right knee extensor tone testing today.  Vital signs are stable.  Speech therapy has not yet been swallow eval.  WBC count was elevated yesterday to 23.9 but he has no fever and chest x-ray on 212/23 and showed no infiltrates.  His oxygen sats have been  Vitals:   10/03/21 0800 10/03/21 1123 10/03/21 1200 10/03/21 1517  BP:  115/64  (!) 129/59  Pulse:  84  88  Resp:  18  20  Temp:  98.9 F (37.2 C)  98.8 F (37.1 C)  TempSrc:  Oral  Oral  SpO2: 99% 97% 97% 98%  Weight:      Height:       CBC:  Recent Labs  Lab 09/29/21 1534 09/29/21 1549 10/02/21 0233  WBC 9.1  --  23.9*  NEUTROABS 5.3  --  20.5*  HGB 12.7* 13.6 12.6*  HCT 40.1 40.0 37.4*  MCV 98.0  --  92.6  PLT 293  --  440   Basic Metabolic Panel:  Recent Labs  Lab 09/29/21 1534 09/29/21 1549 09/30/21 0231 10/02/21 0233  NA 139 141  --  136  K 3.4* 3.3*  --  3.7  CL 110 110  --  111  CO2 20*  --   --  18*  GLUCOSE 111* 108*  --  145*  BUN 23 24*  --  30*  CREATININE 1.06 1.10  --  1.18  CALCIUM 9.0  --   --  8.6*  MG  --   --  2.2  --    Lipid Panel:  Recent Labs  Lab 09/29/21 1534  CHOL 153  TRIG 153*  HDL 58  CHOLHDL 2.6  VLDL 31  LDLCALC 64   HgbA1c:  Recent Labs  Lab 09/29/21 0446  HGBA1C 5.8*   Urine Drug Screen:  Recent Labs  Lab 09/29/21 1549  LABOPIA NONE DETECTED  COCAINSCRNUR NONE DETECTED  LABBENZ NONE DETECTED  AMPHETMU NONE DETECTED  THCU NONE DETECTED  LABBARB NONE DETECTED    Alcohol Level  Recent Labs  Lab 09/29/21 1534  ETH <10    IMAGING past 24 hours No results found.  PHYSICAL EXAM  Physical Exam  Constitutional: Pleasant elderly Caucasian male cardiovascular: Normal rate and regular rhythm.  Respiratory: Effort normal, non-labored breathing, on room air  Neuro: Mental  Status: Patient is awake, alert, oriented to person, place, month, year, and situation. Speech is moderately dysarthric but can be understood without difficulty No neglect Cranial Nerves: II: Visual Fields are full. Pupils are equal, round, and reactive to light.   III,IV, VI: EOMI without ptosis or diploplia.  V: Facial sensation is symmetric to temperature VII: Moderate right lower facial weakness.   VIII: Hearing is intact to voice X: Palate elevates symmetrically XI: Shoulder shrug is symmetric. XII: Tongue protrudes midline without atrophy or fasciculations.  Motor: Tone is normal. Bulk is normal.  Dense right hemiplegia with only right elbow extension movement in right upper and lower extremity as only mild knee extension..  Orbits left over right upper extremity. Sensory: Sensation is symmetric to light touch and temperature in the arms and legs. No extinction to DSS present.  Cerebellar: Slowed rapid alternating movements with right upper extremity.   ASSESSMENT/PLAN Clarence Myers is a 88 y.o. male with history of HTN, HLD, CAD, right  shoulder surgery and glaucoma presenting with new onset of right sided weakness, difficulty ambulating and slurred speech since Thursday.  Patient recently started acetazolamide on Tuesday for his glaucoma and states that he thought his symptoms were initially related to that, however his daughter called the ophthalmologist on Friday when she noticed how weak and slurred his speech was and they stated that this was not an expected side effect and she was encouraged to take him to the ED.  He did fall on Friday as well.   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 Code Stroke- No acute abnormality. Small vessel disease. Atrophy.  CTA head & neck Severe right proximal PCA stenosis with  downstream underfilling. No acute infarct in this distribution by preceding MRI. MRI small acute pontine and corpus callosum infarcts, moderate to severe chronic small vessel ischemic disease and cerebral atrophy, chronic right parietal hemorrhagic infarct 2D Echo EF 65 to 70%. Carotid ultrasound-not needed see CT angio neck  LDL 64 HgbA1c 5.8 VTE prophylaxis -SCDs    Diet   Diet full liquid Room service appropriate? Yes; Fluid consistency: Thin   aspirin 81 mg daily prior to admission, now on aspirin 81 mg daily and clopidogrel 75 mg daily for 3 weeks and then Plavix alone Therapy recommendations: Home health physical therapy Disposition: Pending  Hypertension Home meds: Metoprolol tartrate 12.5 mg twice daily Stable Permissive hypertension (OK if < 220/120) but gradually normalize in 5-7 days Long-term BP goal normotensive  Hyperlipidemia Home meds: Simvastatin 40 mg, resumed in hospital LDL 64, goal < 70 Continue statin at discharge   Other Stroke Risk Factors Advanced Age >/= 59  Hx stroke/TIA Chronic right parietal hemorrhagic infarct Coronary artery disease  Other Active Problems Glaucoma Follows with ophthalmology outpatient  Hospital day # 4   Patient .unfortunately has shown worsening of dysarthria and right hemiplegia.  Continue IV fluids.  Keep n.p.o. till speech therapy does swallow eval.  Continue dual antiplatelet therapy.  Continue IV fluids.  Repeat chest x-ray tomorrow along with CBC and consider antibiotics if he spikes fever or white count keeps on going up..  Greater than 50% time during this 35-minute visit was spent on counseling and coordination of care about his lacunar strokes and discussion about stroke prevention, treatment and answering questions.  Discussed with patient's daughter at the bedside and answered questions.  Discussed with Dr. Sharin Grave, MD Medical Director [facility] Pager: [phone] 10/03/2021 6:03  PM  To contact Stroke Continuity provider, please refer to [email or web address] After hours, contact General Neurology

## 2021-10-03 NOTE — Progress Notes (Signed)
Physical Therapy RE-EVAL Patient Details Name: Clarence Myers MRN: [id number] DOB: 1934/08/02 Today's Date: 10/03/2021   History of Present Illness Patient is a 88 y/o male who presents on 09/29/21 with slurred speech, weakness and difficulty walking; s/p 2 falls at home. 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 Found to have hypokalemia as well. PMH includes CAD, low back strain.    PT Comments    Pt with neurological change yesterday and is presenting with worsening R sided weakness/hemiplegia. Imaging was negative for new infarct or extension of current stroke. Pt emotional today with worsening slurred speech. Pt unable to move R UE or LE but trace limiting OOB mobility to std pvt to chair today. Pt maxA for bed mobility and std pvt to chair. Continue to recommend AIR for maximal functional recovery however aware pt now may need a longer time frame. PT to cont to follow and re-evaluate d/c recommendations.    Recommendations for follow up therapy are one component of a multi-disciplinary discharge planning process, led by the attending physician.  Recommendations may be updated based on patient status, additional functional criteria and insurance authorization.  Follow Up Recommendations  Acute inpatient rehab (3hours/day)     Assistance Recommended at Discharge Frequent or constant Supervision/Assistance  Patient can return home with the following A little help with walking and/or transfers;A little help with bathing/dressing/bathroom;Assistance with cooking/housework;Assist for transportation;Help with stairs or ramp for entrance   Equipment Recommendations  None recommended by PT    Recommendations for Other Services Rehab consult     Precautions / Restrictions Precautions Precautions: Fall Restrictions Weight Bearing Restrictions: No     Mobility  Bed Mobility Overal bed mobility: Needs Assistance Bed Mobility: Supine to Sit     Supine to sit: Mod assist,  +2 for physical assistance     General bed mobility comments: pt unable to move R LE to EOB without assist, modA for trunk elevation, unable to use R UE functionally during transfer    Transfers Overall transfer level: Needs assistance Equipment used: 2 person hand held assist (face to face transfer with R knee blocked) Transfers: Sit to/from Stand Sit to Stand: Max assist, +2 physical assistance   Step pivot transfers: Max assist, +2 physical assistance       General transfer comment: unable to use R UE to power up, R knee blocked, maxA to power up and weight shift to step towards chair, completed 2 sit to stand transfers    Ambulation/Gait               General Gait Details: pt unable to amb today due to significant R sided weakness   Stairs             Wheelchair Mobility    Modified Rankin (Stroke Patients Only) Modified Rankin (Stroke Patients Only) Pre-Morbid Rankin Score: Moderate disability Modified Rankin: Severe disability     Balance Overall balance assessment: Needs assistance, History of Falls Sitting-balance support: Feet supported, Bilateral upper extremity supported Sitting balance-Leahy Scale: Poor Sitting balance - Comments: posterior lean, L lateral lean, modA to maintain EOB balance, pt also falling forward with onset of fatigue Postural control: Posterior lean Standing balance support: Bilateral upper extremity supported Standing balance-Leahy Scale: Poor Standing balance comment: required external support                            Cognition Arousal/Alertness: Awake/alert Behavior During Therapy: WFL for tasks assessed/performed Overall  Cognitive Status: Impaired/Different from baseline Area of Impairment: Attention, Memory, Following commands, Safety/judgement, Problem solving                   Current Attention Level: Selective Memory: Decreased short-term memory Following Commands: Follows one step commands  with increased time Safety/Judgement: Decreased awareness of safety, Decreased awareness of deficits   Problem Solving: Slow processing, Difficulty sequencing, Requires verbal cues General Comments: pt aware of worsening R sided weakness, pt emotional regarding this change but does also joke with PT and OT. pt follows commands majority of time        Exercises      General Comments General comments (skin integrity, edema, etc.): VSS on RA      Pertinent Vitals/Pain Pain Assessment Pain Assessment: Faces Faces Pain Scale: No hurt    Home Living                          Prior Function            PT Goals (current goals can now be found in the care plan section) Acute Rehab PT Goals PT Goal Formulation: With patient Time For Goal Achievement: 10/14/21 Potential to Achieve Goals: Good Progress towards PT goals: Not progressing toward goals - comment (pt with neurological change with incresaed R sided weakness)    Frequency    Min 4X/week      PT Plan Current plan remains appropriate    Co-evaluation PT/OT/SLP Co-Evaluation/Treatment: Yes Reason for Co-Treatment: To address functional/ADL transfers PT goals addressed during session: Mobility/safety with mobility        AM-PAC PT "6 Clicks" Mobility   Outcome Measure  Help needed turning from your back to your side while in a flat bed without using bedrails?: A Lot Help needed moving from lying on your back to sitting on the side of a flat bed without using bedrails?: A Lot Help needed moving to and from a bed to a chair (including a wheelchair)?: A Lot Help needed standing up from a chair using your arms (e.g., wheelchair or bedside chair)?: A Lot Help needed to walk in hospital room?: A Lot Help needed climbing 3-5 steps with a railing? : Total 6 Click Score: 11    End of Session Equipment Utilized During Treatment: Gait belt Activity Tolerance: Patient tolerated treatment well Patient left:  with call bell/phone within reach;in chair;with chair alarm set;with family/visitor present Nurse Communication: Mobility status PT Visit Diagnosis: History of falling (Z91.81);Other abnormalities of gait and mobility (R26.89);Unsteadiness on feet (R26.81)     Time: 5003-7048 PT Time Calculation (min) (ACUTE ONLY): 32 min  Charges:                        Kittie Plater, PT, DPT Acute Rehabilitation Services Pager #: [phone] Office #: [phone]    Berline Lopes 10/03/2021, 2:51 PM

## 2021-10-03 NOTE — Progress Notes (Signed)
Speech Language Pathology Treatment: Dysphagia  Patient Details Name: Clarence Myers MRN: [id number] DOB: 12/18/1933 Today's Date: 10/03/2021 Time: 0840-0900 SLP Time Calculation (min) (ACUTE ONLY): 20 min  Assessment / Plan / Recommendation Clinical Impression  [name] was seen at bedside for dysphagia treatment. His daughter and son in law were present throughout the entirety of the session. The patient's oral motor exam revealed generalized lignual weakness with adequate ROM. Pt observed to continue to have mild-moderate dysarthria possibly secondary to BOT weakness. Pt was presented with individual sips of thin liquids and bites of applesauce puree and solid graham cracker. All textures had a favorable impact on the patient without overt s/sx of aspiration including coughing or throat clearing. Pt verbalized that all textures were going down okay and did not c/o globus sensation. This is an improvement since the last targeted session (10/01/21). Due to recent history of choking and change in neurological status, SLP recommends continuing full liquid diet until objective swallow study next date. Family members educated on diet recommendations and verbalized acceptance.   HPI HPI: Patient is a 88 y/o male who presents on 09/29/21 with slurred speech, weakness and difficulty walking; s/p 2 falls at home. 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 Found to have hypokalemia as well. PMH includes CAD, low back strain. Lungs clear per CXR taken 2/12.      SLP Plan  MBS      Recommendations for follow up therapy are one component of a multi-disciplinary discharge planning process, led by the attending physician.  Recommendations may be updated based on patient status, additional functional criteria and insurance authorization.    Recommendations  Diet recommendations: Thin liquid;Other(comment) (Full Liquids) Liquids provided via: Straw Medication Administration: Crushed with  puree Supervision: Patient able to self feed;Staff to assist with self feeding Compensations: Slow rate;Small sips/bites Postural Changes and/or Swallow Maneuvers: Seated upright 90 degrees                Oral Care Recommendations: Oral care BID Follow Up Recommendations: Acute inpatient rehab (3hours/day) Assistance recommended at discharge: Intermittent Supervision/Assistance SLP Visit Diagnosis: Dysphagia, unspecified (R13.10) Plan: MBS           Bless Belshe  10/03/2021, 9:47 AM

## 2021-10-03 NOTE — Evaluation (Signed)
Occupational Therapy Evaluation RE-EVAL Patient Details Name: BEREN YNIGUEZ MRN: [id number] DOB: Oct 04, 1933 Today's Date: 10/03/2021   History of Present Illness Patient is a 88 y/o male who presents on 09/29/21 with slurred speech, weakness and difficulty walking; s/p 2 falls at home. 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 Found to have hypokalemia as well. PMH includes CAD, low back strain.   Clinical Impression   Laurey Arrow was re-evaluated due to an acute change with worse/new R weakness, also noted with worsening slurred speech and facial droop as compared to initial evaluation with this therapist. Imaging negative. He now requires increased assist with all aspects of her care. No movement against gravity noted in RUE. Poor trunk control. He continues to benefit from OT acutely to progress limitations listed below, goals updated in care plan. Recommend AIR at d/c to progress towards his mod I baseline.    Recommendations for follow up therapy are one component of a multi-disciplinary discharge planning process, led by the attending physician.  Recommendations may be updated based on patient status, additional functional criteria and insurance authorization.   Follow Up Recommendations  Acute inpatient rehab (3hours/day)    Assistance Recommended at Discharge Frequent or constant Supervision/Assistance  Patient can return home with the following A lot of help with walking and/or transfers;A lot of help with bathing/dressing/bathroom;Direct supervision/assist for medications management;Help with stairs or ramp for entrance;Assist for transportation    Functional Status Assessment  Patient has had a recent decline in their functional status and demonstrates the ability to make significant improvements in function in a reasonable and predictable amount of time.  Equipment Recommendations  Other (comment) (defer to next venue)    Recommendations for Other Services Rehab  consult     Precautions / Restrictions Precautions Precautions: Fall Restrictions Weight Bearing Restrictions: No      Mobility Bed Mobility Overal bed mobility: Needs Assistance Bed Mobility: Supine to Sit     Supine to sit: Mod assist, +2 for physical assistance     General bed mobility comments: pt unable to move R LE to EOB without assist, modA for trunk elevation, unable to use R UE functionally during transfer    Transfers Overall transfer level: Needs assistance Equipment used: 2 person hand held assist Transfers: Sit to/from Stand Sit to Stand: Max assist, +2 physical assistance     Step pivot transfers: Max assist, +2 physical assistance     General transfer comment: unable to use R UE to power up, R knee blocked, maxA to power up and weight shift to step towards chair, completed 2 sit to stand transfers      Balance Overall balance assessment: Needs assistance, History of Falls Sitting-balance support: Feet supported, Bilateral upper extremity supported Sitting balance-Leahy Scale: Poor Sitting balance - Comments: posterior lean, L lateral lean, modA to maintain EOB balance, pt also falling forward with onset of fatigue Postural control: Posterior lean Standing balance support: Bilateral upper extremity supported Standing balance-Leahy Scale: Poor Standing balance comment: required external support                           ADL either performed or assessed with clinical judgement   ADL Overall ADL's : Needs assistance/impaired Eating/Feeding: Minimal assistance;Sitting   Grooming: Moderate assistance;Sitting   Upper Body Bathing: Moderate assistance;Sitting   Lower Body Bathing: Maximal assistance;+2 for physical assistance;+2 for safety/equipment;Sit to/from stand   Upper Body Dressing : Moderate assistance;Sitting   Lower Body Dressing:  Maximal assistance;+2 for physical assistance;+2 for safety/equipment;Sit to/from stand   Toilet  Transfer: Maximal assistance;+2 for physical assistance;+2 for safety/equipment;Stand-pivot   Toileting- Clothing Manipulation and Hygiene: Maximal assistance;+2 for safety/equipment;+2 for physical assistance;Sit to/from stand       Functional mobility during ADLs: Maximal assistance;+2 for physical assistance;+2 for safety/equipment General ADL Comments: poor trunk control in sitting, no movement at LUE & LLE extremely weak. Buckling RLE in standing. Requires cues for all sequencing     Vision Baseline Vision/History: 1 Wears glasses Ability to See in Adequate Light: 0 Adequate Patient Visual Report: No change from baseline Vision Assessment?: No apparent visual deficits     Perception     Praxis      Pertinent Vitals/Pain Pain Assessment Pain Assessment: Faces Faces Pain Scale: No hurt Pain Intervention(s): Monitored during session     Hand Dominance Right   Extremity/Trunk Assessment Upper Extremity Assessment Upper Extremity Assessment: RUE deficits/detail RUE Deficits / Details: minimal antigravity movements. unable to use functionally. unaware of positioning RUE Sensation: WNL RUE Coordination: decreased fine motor;decreased gross motor LUE Deficits / Details: grossly 4/5, slow and deliberate coordination LUE Sensation: WNL LUE Coordination: decreased fine motor   Lower Extremity Assessment Lower Extremity Assessment: Defer to PT evaluation   Cervical / Trunk Assessment Cervical / Trunk Assessment: Kyphotic   Communication Communication Communication: Expressive difficulties   Cognition Arousal/Alertness: Awake/alert Behavior During Therapy: WFL for tasks assessed/performed Overall Cognitive Status: Impaired/Different from baseline Area of Impairment: Attention, Memory, Following commands, Safety/judgement, Problem solving                   Current Attention Level: Selective Memory: Decreased short-term memory Following Commands: Follows one step  commands with increased time Safety/Judgement: Decreased awareness of safety, Decreased awareness of deficits   Problem Solving: Slow processing, Difficulty sequencing, Requires verbal cues General Comments: pt aware of worsening R sided weakness, pt emotional regarding this change but does also joke with PT and OT. pt follows commands majority of time     General Comments  VSS on RA, family present    Exercises     Shoulder Instructions      Home Living Family/patient expects to be discharged to:: Private residence Living Arrangements: Spouse/significant other Available Help at Discharge: Family;Available 24 hours/day Type of Home: House Home Access: Stairs to enter [employer] of Steps: 4 Entrance Stairs-Rails: None Home Layout: Two level;Able to live on main level with bedroom/bathroom Alternate Level Stairs-Number of Steps: 1 flight   Bathroom Shower/Tub: Walk-in shower;Tub/shower unit   Bathroom Toilet: Standard Bathroom Accessibility: Yes How Accessible: Accessible via walker Home Equipment: Shower seat;Cane - single point;Rolling Walker (2 wheels)   Additional Comments: wife is unwell, unable to physically assist pt.  Lives With: Spouse    Prior Functioning/Environment Prior Level of Function : Needs assist             Mobility Comments: Uses RW for ambulation. Active wtih OPPT ADLs Comments: independent, family drives to appts as pt and wife do not drive. Family brings meals/eat out, 2 falls leading to this admission        OT Problem List: Decreased range of motion;Decreased strength;Decreased activity tolerance;Impaired balance (sitting and/or standing);Decreased coordination;Decreased cognition;Decreased safety awareness      OT Treatment/Interventions: Self-care/ADL training;Therapeutic exercise;DME and/or AE instruction;Therapeutic activities;Balance training    OT Goals(Current goals can be found in the care plan section) Acute Rehab OT  Goals Patient Stated Goal: to get better OT Goal Formulation: With  patient Time For Goal Achievement: 10/17/21 Potential to Achieve Goals: Good  OT Frequency: Min 2X/week    Co-evaluation PT/OT/SLP Co-Evaluation/Treatment: Yes Reason for Co-Treatment: Complexity of the patient's impairments (multi-system involvement);For patient/therapist safety;To address functional/ADL transfers PT goals addressed during session: Mobility/safety with mobility        AM-PAC OT "6 Clicks" Daily Activity     Outcome Measure Help from another person eating meals?: A Little Help from another person taking care of personal grooming?: A Lot Help from another person toileting, which includes using toliet, bedpan, or urinal?: A Lot Help from another person bathing (including washing, rinsing, drying)?: A Lot Help from another person to put on and taking off regular upper body clothing?: A Lot Help from another person to put on and taking off regular lower body clothing?: A Lot 6 Click Score: 13   End of Session Equipment Utilized During Treatment: Gait belt Nurse Communication: Mobility status  Activity Tolerance: Patient tolerated treatment well Patient left: in chair;with call bell/phone within reach;with chair alarm set;with family/visitor present  OT Visit Diagnosis: Unsteadiness on feet (R26.81);Other abnormalities of gait and mobility (R26.89);Muscle weakness (generalized) (M62.81);History of falling (Z91.81)                Time: 2023-3435 OT Time Calculation (min): 32 min Charges:  OT General Charges $OT Visit: 1 Visit OT Evaluation $OT Eval Moderate Complexity: 1 Mod   Esli Jernigan A Tyleek Smick 10/03/2021, 3:33 PM

## 2021-10-03 NOTE — Progress Notes (Signed)
STROKE TEAM PROGRESS NOTE   INTERVAL HISTORY Stroke team was asked to evaluate patient due to sudden onset of neurological change with increased dysarthria and right hemiplegia this morning.  Vital signs are stable.  Patient was sent for stat CT scan of the head which I personally reviewed showed no acute abnormality.  No hemorrhage.  Evolving pontine and subcortical infarct.  Patient was advised bedrest and given IV normal saline 20 cc bolus and started at 70 cc an hour.  I spoke with the patient daughter at bedside.Unfortunately some patients with lacunar strokes can fluctuate at this and get transiently worse.  She expressed understanding Vitals:   10/03/21 0800 10/03/21 1123 10/03/21 1200 10/03/21 1517  BP:  115/64  (!) 129/59  Pulse:  84  88  Resp:  18  20  Temp:  98.9 F (37.2 C)  98.8 F (37.1 C)  TempSrc:  Oral  Oral  SpO2: 99% 97% 97% 98%  Weight:      Height:       CBC:  Recent Labs  Lab 09/29/21 1534 09/29/21 1549 10/02/21 0233  WBC 9.1  --  23.9*  NEUTROABS 5.3  --  20.5*  HGB 12.7* 13.6 12.6*  HCT 40.1 40.0 37.4*  MCV 98.0  --  92.6  PLT 293  --  711   Basic Metabolic Panel:  Recent Labs  Lab 09/29/21 1534 09/29/21 1549 09/30/21 0231 10/02/21 0233  NA 139 141  --  136  K 3.4* 3.3*  --  3.7  CL 110 110  --  111  CO2 20*  --   --  18*  GLUCOSE 111* 108*  --  145*  BUN 23 24*  --  30*  CREATININE 1.06 1.10  --  1.18  CALCIUM 9.0  --   --  8.6*  MG  --   --  2.2  --    Lipid Panel:  Recent Labs  Lab 09/29/21 1534  CHOL 153  TRIG 153*  HDL 58  CHOLHDL 2.6  VLDL 31  LDLCALC 64   HgbA1c:  Recent Labs  Lab 09/29/21 0446  HGBA1C 5.8*   Urine Drug Screen:  Recent Labs  Lab 09/29/21 1549  LABOPIA NONE DETECTED  COCAINSCRNUR NONE DETECTED  LABBENZ NONE DETECTED  AMPHETMU NONE DETECTED  THCU NONE DETECTED  LABBARB NONE DETECTED    Alcohol Level  Recent Labs  Lab 09/29/21 1534  ETH <10    IMAGING past 24 hours No results  found.  PHYSICAL EXAM  Physical Exam  Constitutional: Pleasant elderly Caucasian male cardiovascular: Normal rate and regular rhythm.  Respiratory: Effort normal, non-labored breathing, on room air  Neuro: Mental Status: Patient is awake, alert, oriented to person, place, month, year, and situation. Speech is moderately dysarthric but can be understood without difficulty No neglect Cranial Nerves: II: Visual Fields are full. Pupils are equal, round, and reactive to light.   III,IV, VI: EOMI without ptosis or diploplia.  V: Facial sensation is symmetric to temperature VII: Moderate right lower facial weakness.   VIII: Hearing is intact to voice X: Palate elevates symmetrically XI: Shoulder shrug is symmetric. XII: Tongue protrudes midline without atrophy or fasciculations.  Motor: Tone is normal. Bulk is normal.  Dense right hemiplegia with 0/5 right upper and lower extremity strength with trace withdrawal in the right leg but none in the arm.  Orbits left over right upper extremity. Sensory: Sensation is symmetric to light touch and temperature in the arms and legs.  No extinction to DSS present.  Cerebellar: Slowed rapid alternating movements with right upper extremity.   ASSESSMENT/PLAN [name] is a 88 y.o. male with history of HTN, HLD, CAD, right shoulder surgery and glaucoma presenting with new onset of right sided weakness, difficulty ambulating and slurred speech since Thursday.  Patient recently started acetazolamide on Tuesday for his glaucoma and states that he thought his symptoms were initially related to that, however his daughter called the ophthalmologist on Friday when she noticed how weak and slurred his speech was and they stated that this was not an expected side effect and she was encouraged to take him to the ED.  He did fall on Friday as well.   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 Code Stroke- No acute abnormality. Small vessel disease. Atrophy.  CTA head & neck Severe right proximal PCA stenosis with downstream underfilling. No acute infarct in this distribution by preceding MRI. MRI small acute pontine and corpus callosum infarcts, moderate to severe chronic small vessel ischemic disease and cerebral atrophy, chronic right parietal hemorrhagic infarct 2D Echo pending Carotid ultrasound-pending LDL 64 HgbA1c 5.8 VTE prophylaxis -SCDs    Diet   Diet full liquid Room service appropriate? Yes; Fluid consistency: Thin   aspirin 81 mg daily prior to admission, now on aspirin 81 mg daily and clopidogrel 75 mg daily for 3 weeks and then Plavix alone Therapy recommendations: Home health physical therapy Disposition: Pending  Hypertension Home meds: Metoprolol tartrate 12.5 mg twice daily Stable Permissive hypertension (OK if < 220/120) but gradually normalize in 5-7 days Long-term BP goal normotensive  Hyperlipidemia Home meds: Simvastatin 40 mg, resumed in hospital LDL 64, goal < 70 Continue statin at discharge   Other Stroke Risk Factors Advanced Age >/= 47  Hx stroke/TIA Chronic right parietal hemorrhagic infarct Coronary artery disease  Other Active Problems Glaucoma Follows with ophthalmology outpatient  Hospital day # 4   Patient .unfortunately has shown worsening of dysarthria and right hemiplegia.  Evaluation of the stroke and stat CT scan shows no hemorrhage.  Continue dual antiplatelet therapy.  Keep n.p.o. today.  Speech therapy for swallow eval.  IV normal saline 5 cc bolus ordered by 70 cc an hour.  Greater than 50% time during this 50-minute visit was spent on counseling and coordination of care about his lacunar strokes and discussion about stroke prevention, treatment and answering questions.  Discussed with patient's daughter at the bedside  and answered questions.  Discussed with Dr. Sharin Grave, MD Medical Director [place] Pager: [phone] 10/03/2021 5:56 PM  To contact Stroke Continuity provider, please refer to [email or web address] After hours, contact General Neurology

## 2021-10-03 NOTE — Plan of Care (Signed)
°  Problem: Education: Goal: Knowledge of General Education information will improve Description: Including pain rating scale, medication(s)/side effects and non-pharmacologic comfort measures Outcome: Progressing   Problem: Safety: Goal: Ability to remain free from injury will improve Outcome: Progressing   Problem: Self-Care: Goal: Ability to communicate needs accurately will improve Outcome: Progressing

## 2021-10-03 NOTE — Progress Notes (Addendum)
Inpatient Rehabilitation Admissions Coordinator   I met with patient , wife and 2 daughters at bedside. We await further progress with therapy and discussions with Dr Leonie Man and therapy to determine rehab venue options of CIR vs SNF.  Danne Baxter, RN, MSN Rehab Admissions Coordinator [phone] 10/03/2021 1:44 PM

## 2021-10-03 NOTE — Progress Notes (Signed)
PROGRESS NOTE  Clarence Myers HYQ:[id number] DOB: 1934-03-27 DOA: 09/29/2021 PCP: Mayra Neer, MD  Brief History   Clarence Myers is a 88 y.o. male with medical history significant of HTN, CAD, HLD, glaucoma who presents for evaluation of generalized weakness with ataxia and slurred speech.  He reports that he began to have weakness and some ataxia last night and when he woke up this morning he had garbled slurred speech.  He was recently started on acetazolamide for his glaucoma by his ophthalmologist was told that it could cause some continue in his hands and feet so when the initial symptoms started he attributed it to that.  When his speech was slurred this morning the family became more concerned and as it persisted throughout the day despite having stopped the medication 24 hours earlier they decided to come for evaluation.  He did not have any loss of consciousness.  He did have a fall last night and again this morning but had no head trauma and he denies any headache.  He reports he has been off balance and has had generalized weakness when he tries to walk but it cannot attribute to one side or the other.  States he has not had any fever or chills.  He denies any chest pain, palpitations, shortness of breath, urinary symptoms, abdominal pain, nausea vomiting or diarrhea. With his wife.  Denies tobacco, alcohol, illicit drug use.   History Pound has been hemodynamically stable in the emergency room.  He was found to have mild hypokalemia and was given oral potassium supplementation.  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  Hospitalist service was asked to admit for further stroke work-up and neurology was consulted by the ER physician.  MRI has demonstrated acute pontine and corpus callosum infarcts. The patient was started on plavix and ASA. Stroke work up has been unremarkable. Today the patient had a change in neurological status  with new weakness or his right upper extremity. Stat CT head was negative for change. Neurology evaluated the patient. Will continue to monitor for change. SLP has been asked to re-evaluate again. The patient has been made NPO.  Consultants  Neurology  Procedures    Antibiotics   Anti-infectives (From admission, onward)    None      Subjective  The patient is resting comfortably. He has no use of his right upper extremity at all. Family is at bedside. His speech has deteriorated.  Objective   Vitals:  Vitals:   10/03/21 1200 10/03/21 1517  BP:  (!) 129/59  Pulse:  88  Resp:  20  Temp:  98.8 F (37.1 C)  SpO2: 97% 98%    Exam:  Constitutional:  The patient is awake, alert, and oriented x 3. Mild distress from new deficit. Respiratory:  No increased work of breathing. No wheezes, rales, or rhonchi No tactile fremitus Cardiovascular:  Regular rate and rhythm No murmurs, ectopy, or gallups. No lateral PMI. No thrills. Abdomen:  Abdomen is soft, non-tender, non-distended No hernias, masses, or organomegaly Normoactive bowel sounds.  Musculoskeletal:  No cyanosis, clubbing, or edema Skin:  No rashes, lesions, ulcers palpation of skin: no induration or nodules Neurologic:  Slurred speech Sensation all 4 extremities intact Weakness of right upper and lower extremities. Psychiatric:  Mental status Mood, affect appropriate Orientation to person, place, time  judgment and insight appear intact  I have personally reviewed the following:   Today's Data  Vitals  Lab Data  BMP CBC  Micro Data    Imaging  CT head MRI brain Repeat CT head  Cardiology Data  Echocardiogram  Other Data    Scheduled Meds:  aspirin EC  81 mg Oral Daily   clopidogrel  75 mg Oral Daily   cycloSPORINE  1 drop Both Eyes BID   latanoprost  1 drop Both Eyes QHS   multivitamin  1 tablet Oral Daily   omega-3 acid ethyl esters  1 g Oral Daily   pantoprazole  40 mg Oral  Daily   simvastatin  40 mg Oral Daily   Continuous Infusions:  sodium chloride 70 mL/hr at 10/03/21 1058     Principal Problem:   CVA (cerebral vascular accident) ([place]) Active Problems:   CAD (coronary artery disease)   Hyperlipidemia   Essential hypertension   Hypokalemia   Slurred speech   LOS: 4 days   A & P  Assessment and Plan: * CVA (cerebral vascular accident) ([place])- (present on admission) Mr. Mcmahill admitted to medical telemetry floor for TIA/CVA.  MRI has confirmed the presence of small acute infarcts in the corpus callosum and pons. Will evaluate carotids for large vessel occlusion/stenosis. Echocardiogram demonstrates LVEF of 65-70%. Normal function, no regional wall motion abnormalities. There is Grade I diastolic dysfunction. RV systolic function is normal. PASP is normal. There is mild to moderate aortic valve stenosis. Hypertension will gradually be normalized over 4-5 days as recommended by Dr. Tilden Dome. Antiplatelet therapy with aspirin daily. Continue statin therapy.  Check lipid panel.  Consult ST/PT in am. The patient has been evaluated by PT/OT. The recommendation has been made for CIR. Rehab has been consulted to evaluated.   On 10/02/2021 the patient has lost the use of his right upper extremity. Stat CT head was obtained. The patient was seen by Dr. Tilden Dome. Will continue to monitor.  I appreciate neurology assistance. He has remained a little function this morning.   Slurred speech- (present on admission) Patient was cleared by SLP for swallow yesterday. However, today the patient has been choking and possibly aspirating on his meals. He has been made NPO and SLP has been re-consulted. CXR was checked and no acute cardiopulmonary process was identified.  Repeat SLP eval was ordered today after patient's change in neurological status. MBS is planned.  Elevation of WBC on CBC from 10/02/2021 is possibly a consequence of inflammation due to aspiration pneumonitis.  Other than elevated WBC the patient does not show any signs of pneumonia. He is cognitively intact. He is saturating 98% on room air without complaints of cough. Will follow CBC and check procalcitonin and recheck CXR.   Hypokalemia- (present on admission) Supplement and monitor potassium as necessary.  Essential hypertension Permissive Hypertension with slow normalization of pressures.  Monitor BP. Blood pressures today are normotensive. Have not restarted antihypertensives for this reason.  Hyperlipidemia- (present on admission) Continue statin  CAD (coronary artery disease)- (present on admission) Continue statin. The patient is normotensive without antihypertensives.    I have seen and examined this patient myself. I have spent 36 minutes in his evaluation and care.  DVT prophylaxis: SCD's Code Status: Full Code Family Communication: None available Disposition Plan: tbd    Brydan Downard, DO [facility] Direct contact: see [email or web address]  7PM-7AM contact night coverage as above 10/03/2021, 6:23 PM  LOS: 1 day

## 2021-10-04 ENCOUNTER — Inpatient Hospital Stay (HOSPITAL_COMMUNITY): Payer: PPO

## 2021-10-04 LAB — BASIC METABOLIC PANEL
Anion gap: 9 (ref 5–15)
BUN: 21 mg/dL (ref 8–23)
CO2: 18 mmol/L — ABNORMAL LOW (ref 22–32)
Calcium: 8.6 mg/dL — ABNORMAL LOW (ref 8.9–10.3)
Chloride: 110 mmol/L (ref 98–111)
Creatinine, Ser: 1.02 mg/dL (ref 0.61–1.24)
GFR, Estimated: >60 mL/min (ref >60)
Glucose, Bld: 118 mg/dL — ABNORMAL HIGH (ref 70–99)
Potassium: 3.6 mmol/L (ref 3.5–5.1)
Sodium: 137 mmol/L (ref 135–145)

## 2021-10-04 LAB — BLOOD CULTURE ID PANEL (REFLEXED) - BCID2

## 2021-10-04 LAB — CBC WITH DIFFERENTIAL/PLATELET
Abs Immature Granulocytes: 0.09 10*3/uL — ABNORMAL HIGH (ref 0.00–0.07)
Basophils Absolute: 0 10*3/uL (ref 0.0–0.1)
Basophils Relative: 0 %
Eosinophils Absolute: 0.3 10*3/uL (ref 0.0–0.5)
Eosinophils Relative: 2 %
HCT: 36.3 % — ABNORMAL LOW (ref 39.0–52.0)
Hemoglobin: 12.7 g/dL — ABNORMAL LOW (ref 13.0–17.0)
Immature Granulocytes: 1 %
Lymphocytes Relative: 9 %
Lymphs Abs: 1.7 10*3/uL (ref 0.7–4.0)
MCH: 32 pg (ref 26.0–34.0)
MCHC: 35 g/dL (ref 30.0–36.0)
MCV: 91.4 fL (ref 80.0–100.0)
Monocytes Absolute: 1.4 10*3/uL — ABNORMAL HIGH (ref 0.1–1.0)
Monocytes Relative: 7 %
Neutro Abs: 15.8 10*3/uL — ABNORMAL HIGH (ref 1.7–7.7)
Neutrophils Relative %: 81 %
Platelets: 235 10*3/uL (ref 150–400)
RBC: 3.97 MIL/uL — ABNORMAL LOW (ref 4.22–5.81)
RDW: 13.1 % (ref 11.5–15.5)
WBC: 19.3 10*3/uL — ABNORMAL HIGH (ref 4.0–10.5)
nRBC: 0 % (ref 0.0–0.2)

## 2021-10-04 LAB — PROCALCITONIN: Procalcitonin: 0.33 ng/mL

## 2021-10-04 IMAGING — DX DG CHEST 1V PORT
1 series · 1 of 1 positions shown · non-contrast
Comparison: Radiographs [DATE] and [DATE].

CLINICAL DATA: Dysphagia.  Question aspiration.

EXAM:
PORTABLE CHEST 1 VIEW

[chest ap]
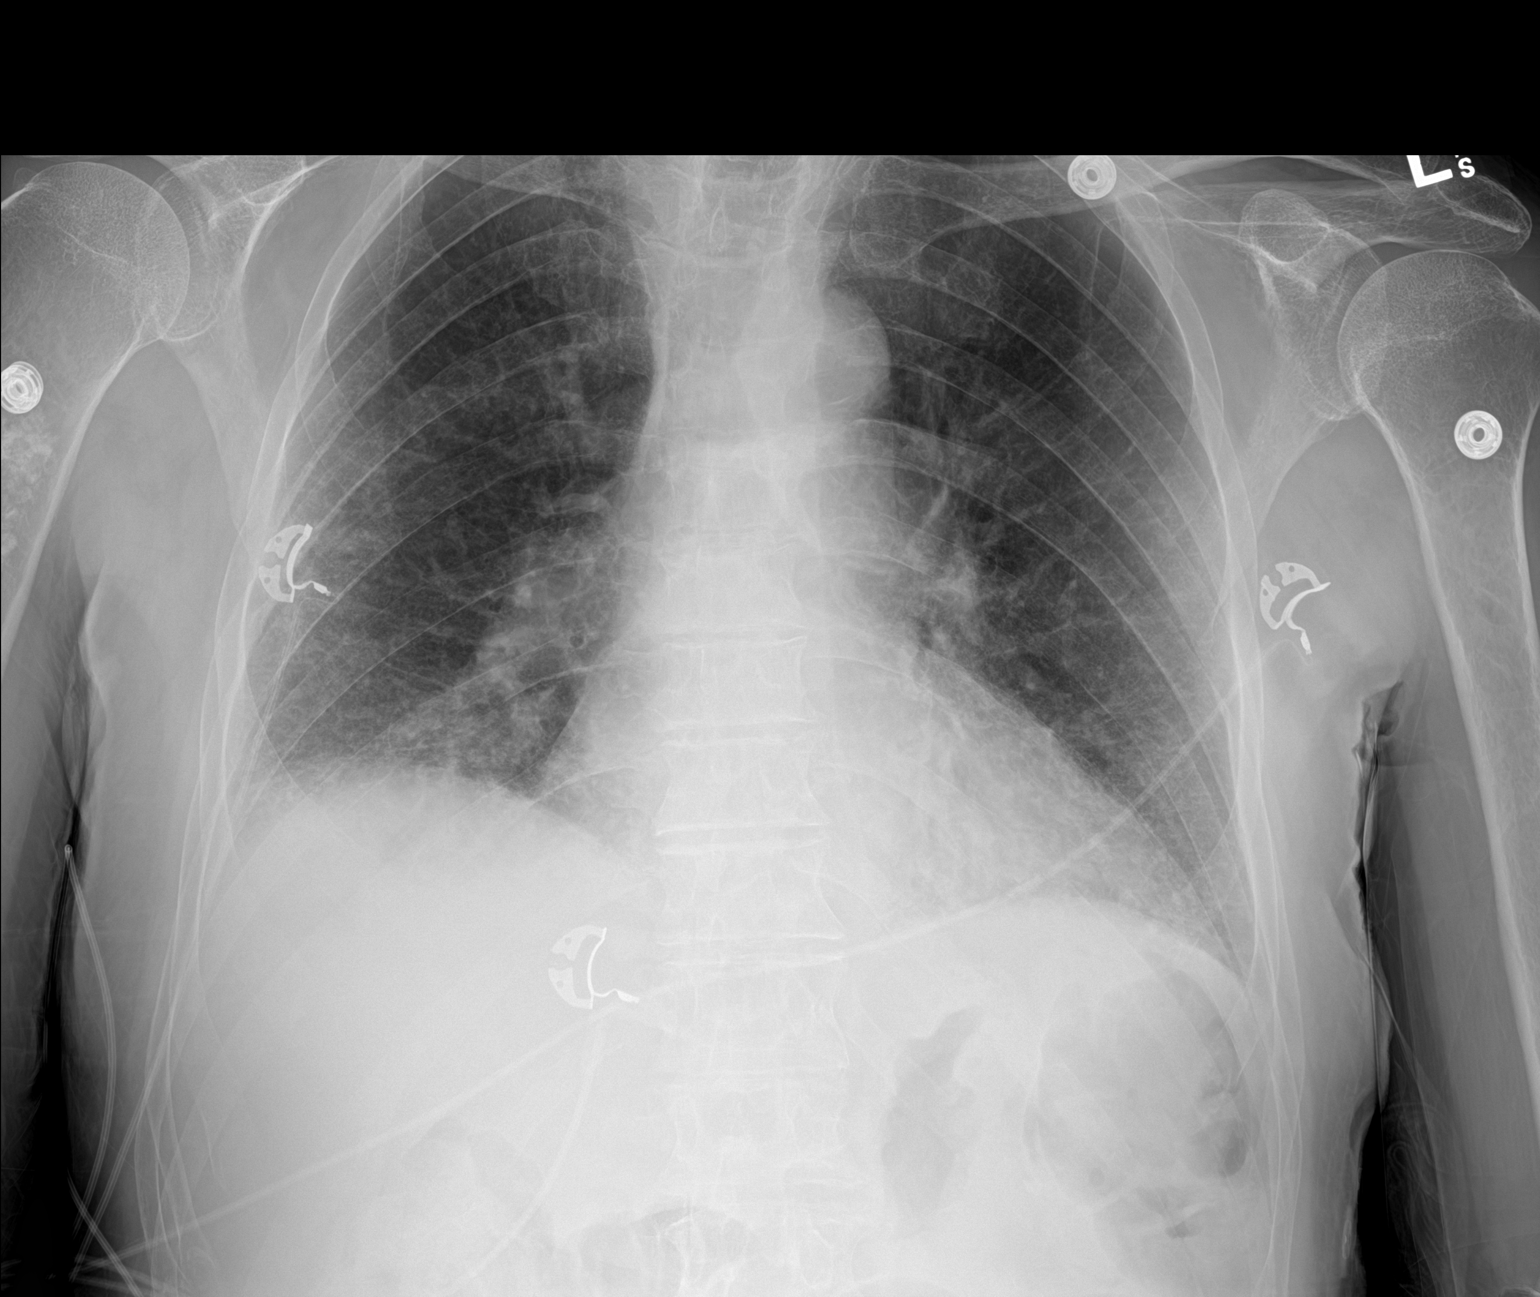

[1 of 1 positions shown; findings below may reference images not displayed]

FINDINGS: [50] hours. The heart size and mediastinal contours are stable.
Chronic interstitial prominence is noted at both lung bases, perhaps
slightly worse on the right over the interval. There is no pleural
effusion or pneumothorax. No acute osseous findings are identified.
Probable chondroid lesion in the proximal right humeral diaphysis,
incompletely visualized. Telemetry leads overlie the chest.
IMPRESSION: Possible increased right basilar opacity compared with prior studies
which could represent early aspiration. No consolidation.

## 2021-10-04 MED ORDER — TRAMADOL HCL 50 MG PO TABS
50.0000 mg | ORAL_TABLET | Freq: Four times a day (QID) | ORAL | Status: DC | PRN
Start: 2021-10-04 — End: 2021-10-10

## 2021-10-04 MED ORDER — PIPERACILLIN-TAZOBACTAM 3.375 G IVPB
3.3750 g | Freq: Three times a day (TID) | INTRAVENOUS | Status: DC
  Administered 2021-10-04 – 2021-10-05 (×3): 3.375 g via INTRAVENOUS
  Filled 2021-10-04 (×3): qty 50

## 2021-10-04 MED ORDER — SODIUM CHLORIDE 0.9 % IV BOLUS
500.0000 mL | Freq: Once | INTRAVENOUS | Status: AC
  Administered 2021-10-04: 500 mL via INTRAVENOUS

## 2021-10-04 MED ORDER — PREDNISONE 5 MG PO TABS
5.0000 mg | ORAL_TABLET | Freq: Every day | ORAL | Status: DC
Start: 2021-10-05 — End: 2021-10-04

## 2021-10-04 NOTE — Progress Notes (Signed)
PHARMACY - PHYSICIAN COMMUNICATION CRITICAL VALUE ALERT - BLOOD CULTURE IDENTIFICATION (BCID)  Clarence Myers is an 88 y.o. male who presented to [facility] on 09/29/2021.   Assessment:  1/4 GPC, BCID staph species staph epi with no resistance detected - likely contaminant   Name of physician (or Provider) Contacted: Dr. Thereasa Solo  Current antibiotics: Zosyn 3.375g IV q8h (EI) for aspiration PNA concern.   Changes to prescribed antibiotics recommended:  Patient is on recommended antibiotics - No changes needed  No results found for this or any previous visit.  Cristela Felt, PharmD, BCPS Clinical Pharmacist 10/04/2021 10:16 PM

## 2021-10-04 NOTE — Plan of Care (Signed)
  Problem: Clinical Measurements: Goal: Cardiovascular complication will be avoided Outcome: Progressing   Problem: Activity: Goal: Risk for activity intolerance will decrease Outcome: Progressing   Problem: Safety: Goal: Ability to remain free from injury will improve Outcome: Progressing   Problem: Skin Integrity: Goal: Risk for impaired skin integrity will decrease Outcome: Progressing   

## 2021-10-04 NOTE — Progress Notes (Signed)
PT Cancellation Note  Patient Details Name: Clarence Myers MRN: [id number] DOB: Mar 14, 1934   Cancelled Treatment:    Reason Eval/Treat Not Completed: Fatigue/lethargy limiting ability to participate. Pt is lethargic and will retry at another time.   Ramond Dial 10/04/2021, 3:03 PM  Mee Hives, PT PhD Acute Rehab Dept. Number: [place] [facility] [place]

## 2021-10-04 NOTE — Progress Notes (Signed)
Pharmacy Antibiotic Note  Clarence Myers is a 88 y.o. male admitted on 09/29/2021 with stroke.  Pharmacy has been consulted for Zosyn dosing for aspiration pneumonia. Tmax 100.7. WBC 19.3. CrCl; ~43 ml/min.   Plan: Start Zosyn 3.375g IV q8h (extended infusion) Monitor renal function, cultures, and clinical progression  Height: 5\' 3"  (160 cm) Weight: 61.8 kg (136 lb 3.9 oz) IBW/kg (Calculated) : 56.9  Temp (24hrs), Avg:99 F (37.2 C), Min:97.9 F (36.6 C), Max:100.7 F (38.2 C)  Recent Labs  Lab 09/29/21 1534 09/29/21 1549 10/02/21 0233 10/04/21 0333  WBC 9.1  --  23.9* 19.3*  CREATININE 1.06 1.10 1.18 1.02    Estimated Creatinine Clearance: 42.6 mL/min (by C-G formula based on SCr of 1.02 mg/dL).    Allergies  Allergen Reactions   Blue Crab (Cavinectes Sapidus) Allergy Skin Test     "Hives"    Other     Bee Stings    Antimicrobials this admission: Zosyn 2/15 >>  Dose adjustments this admission:   Microbiology results: 2/14 bcx: ngtd   Thank you for allowing pharmacy to be a part of this patients care.  Cristela Felt, PharmD, BCPS Clinical Pharmacist 10/04/2021 3:05 PM

## 2021-10-04 NOTE — Progress Notes (Signed)
Modified Barium Swallow Progress Note  Patient Details  Name: Clarence Myers MRN: [id number] Date of Birth: 1934/02/08  Today's Date: 10/04/2021  Modified Barium Swallow completed.  Full report located under Chart Review in the Imaging Section.  Brief recommendations include the following:  Clinical Impression  Patient presents with a mild-moderate oropharyngeal dysphagia which was highly impacted today by him being very lethargic. SLP was only able to administer one cup sip of each: thin, nectar thick, puree. He exhibited oral transit delays with all consistencies, swallow initiation delays to level of vallecular sinus with all consistencies. With puree solids, he exhibited mild vallecular residuals which cleared with subsequent swallows and with nectar thick and thin liquids, exhibited trace to mild vallecular residuals and trace pyriform residuals. One instance of flash penetration (PAS 2) observed during the swallow with thin liquids but no aspiration observed. Cricopharyngeal phase of swallow appeared [facility]. SLP is recommending to continue on full liquids (thin) and will be able to trial upgraded solids at bedside.   Swallow Evaluation Recommendations       SLP Diet Recommendations: Other (Comment);Thin liquid (full liquids)   Liquid Administration via: Cup   Medication Administration: Crushed with puree   Supervision: Full assist for feeding;Full supervision/cueing for compensatory strategies   Compensations: Minimize environmental distractions;Slow rate;Small sips/bites   Postural Changes: Seated upright at 90 degrees   Oral Care Recommendations: Oral care BID;Staff/trained caregiver to provide oral care       Sonia Baller, MA, CCC-SLP Speech Therapy

## 2021-10-04 NOTE — Progress Notes (Signed)
OT Cancellation Note  Patient Details Name: KEMONTAE DUNKLEE MRN: [id number] DOB: 11-04-1933   Cancelled Treatment:    Reason Eval/Treat Not Completed: Patient's level of consciousness (Per family, pt just received pain medication and extremely lethargic. Unable to rouse for participation. OT treatment to f/u as able.)  Aldona Bar A Oliva Montecalvo 10/04/2021, 3:06 PM

## 2021-10-04 NOTE — Progress Notes (Signed)
Clarence Myers  JQZ:[id number] DOB: 1934-08-20 DOA: 09/29/2021 PCP: Mayra Neer, MD    Brief Narrative:  [phone] with a history of HTN, CAD, HLD, and glaucoma who presented to the ED with right-sided weakness and ataxia with some slurring of speech which was present upon waking in the morning.  MRI brain in the ED noted small acute pontine and corpus callosum infarcts.  After the patient was admitted he suffered the acute onset of worsening right upper extremity focal weakness.  Stat CT head was without acute change.  Consultants:  Neurology  Code Status: FULL CODE  Antimicrobials:  None  DVT prophylaxis: SCDs  Interim Hx: Low-grade temperature elevation to 100.7.  Mild tachycardia.  Saturations normal at 98% on room air.  WBC remains elevated but has improved from 24-19.  Procalcitonin elevated but improving from 0.61-0.33.  CXR today suggestive of right lower lobe infiltrate which is new.  Patient is sedated at time of my evaluation having recently received sedating medications.  I spoke with the patient's family at bedside at length.  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 Felt to be due to small vessel disease -work-up completed by Neurology -dual antiplatelet therapy continues  Slurred speech -dysphagia Waxing and waning during this admission with some intermittent dysphagia -diet as per SLP at this time  Aspiration pneumonia right lower lobe Initiate empiric antibiotic therapy given elevated procalcitonin and other clinical symptoms of active infection  Hypokalemia Corrected with supplementation  HTN Practicing permissive hypertension for now  HLD Continue medical therapy  History of CAD  Family Communication: Spoke with wife and 2 daughters at bedside at length Disposition: From home -likely will require at least inpatient rehab but possibly SNF rehab stay  Objective: Blood pressure (!) 142/74, pulse (!) 104, temperature (!) 100.7 F  (38.2 C), temperature source Oral, resp. rate (!) 24, height 5\' 3"  (1.6 m), weight 61.8 kg, SpO2 98 %.  Intake/Output Summary (Last 24 hours) at 10/04/2021 1006 Last data filed at 10/04/2021 0421 Gross per 24 hour  Intake --  Output 2775 ml  Net -2775 ml   Filed Weights   09/29/21 1518  Weight: 61.8 kg    Examination: General: No acute respiratory distress -sedate Lungs: Crackles in right base with good air movement throughout other fields with no wheezing Cardiovascular: Regular rate and rhythm without murmur gallop or rub normal S1 and S2 Abdomen: Nontender, nondistended, soft, bowel sounds positive, no rebound, no ascites, no appreciable mass Extremities: No significant cyanosis, clubbing, or edema bilateral lower extremities  CBC: Recent Labs  Lab 09/29/21 1534 09/29/21 1549 10/02/21 0233 10/04/21 0333  WBC 9.1  --  23.9* 19.3*  NEUTROABS 5.3  --  20.5* 15.8*  HGB 12.7* 13.6 12.6* 12.7*  HCT 40.1 40.0 37.4* 36.3*  MCV 98.0  --  92.6 91.4  PLT 293  --  265 333   Basic Metabolic Panel: Recent Labs  Lab 09/29/21 1534 09/29/21 1549 09/30/21 0231 10/02/21 0233 10/04/21 0333  NA 139 141  --  136 137  K 3.4* 3.3*  --  3.7 3.6  CL 110 110  --  111 110  CO2 20*  --   --  18* 18*  GLUCOSE 111* 108*  --  145* 118*  BUN 23 24*  --  30* 21  CREATININE 1.06 1.10  --  1.18 1.02  CALCIUM 9.0  --   --  8.6* 8.6*  MG  --   --  2.2  --   --    GFR: Estimated Creatinine Clearance: 42.6 mL/min (by C-G formula based on SCr of 1.02 mg/dL).  Liver Function Tests: Recent Labs  Lab 09/29/21 1534  AST 16  ALT 13  ALKPHOS 55  BILITOT 0.5  PROT 7.1  ALBUMIN 3.7    Coagulation Profile: Recent Labs  Lab 09/29/21 1534  INR 1.0     HbA1C: Hgb A1c MFr Bld  Date/Time Value Ref Range Status  09/29/2021 04:46 AM 5.8 (H) 4.8 - 5.6 % Final    Comment:    (NOTE) Pre diabetes:          5.7%-6.4%  Diabetes:              >6.4%  Glycemic control for   <7.0% adults with  diabetes     Recent Results (from the past 240 hour(s))  Resp Panel by RT-PCR (Flu A&B, Covid) Nasopharyngeal Swab     Status: None   Collection Time: 09/29/21  3:17 PM   Specimen: Nasopharyngeal Swab; Nasopharyngeal(NP) swabs in vial transport medium  Result Value Ref Range Status   SARS Coronavirus 2 by RT PCR NEGATIVE NEGATIVE Final    Comment: (NOTE) SARS-CoV-2 target nucleic acids are NOT DETECTED.  The SARS-CoV-2 RNA is generally detectable in upper respiratory specimens during the acute phase of infection. The lowest concentration of SARS-CoV-2 viral copies this assay can detect is 138 copies/mL. A negative result does not preclude SARS-Cov-2 infection and should not be used as the sole basis for treatment or other patient management decisions. A negative result may occur with  improper specimen collection/handling, submission of specimen other than nasopharyngeal swab, presence of viral mutation(s) within the areas targeted by this assay, and inadequate number of viral copies(<138 copies/mL). A negative result must be combined with clinical observations, patient history, and epidemiological information. The expected result is Negative.  Fact Sheet for Patients:  EntrepreneurPulse.com.au  Fact Sheet for Healthcare Providers:  IncredibleEmployment.be  This test is no t yet approved or cleared by the [place] FDA and  has been authorized for detection and/or diagnosis of SARS-CoV-2 by FDA under an Emergency Use Authorization (EUA). This EUA will remain  in effect (meaning this test can be used) for the duration of the COVID-19 declaration under Section 564(b)(1) of the Act, 21 U.S.C.section 360bbb-3(b)(1), unless the authorization is terminated  or revoked sooner.       Influenza A by PCR NEGATIVE NEGATIVE Final   Influenza B by PCR NEGATIVE NEGATIVE Final    Comment: (NOTE) The Xpert Xpress SARS-CoV-2/FLU/RSV plus assay is  intended as an aid in the diagnosis of influenza from Nasopharyngeal swab specimens and should not be used as a sole basis for treatment. Nasal washings and aspirates are unacceptable for Xpert Xpress SARS-CoV-2/FLU/RSV testing.  Fact Sheet for Patients: EntrepreneurPulse.com.au  Fact Sheet for Healthcare Providers: IncredibleEmployment.be  This test is not yet approved or cleared by the [place] FDA and has been authorized for detection and/or diagnosis of SARS-CoV-2 by FDA under an Emergency Use Authorization (EUA). This EUA will remain in effect (meaning this test can be used) for the duration of the COVID-19 declaration under Section 564(b)(1) of the Act, 21 U.S.C. section 360bbb-3(b)(1), unless the authorization is terminated or revoked.  Performed at [facility], [place] [address], [place], [place] [address]   Culture, blood (routine x 2)     Status: None (Preliminary result)   Collection Time: 10/03/21  6:37 PM  Specimen: BLOOD  Result Value Ref Range Status   Specimen Description BLOOD BLOOD RIGHT FOREARM  Final   Special Requests   Final    BOTTLES DRAWN AEROBIC AND ANAEROBIC Blood Culture adequate volume   Culture   Final    NO GROWTH < 12 HOURS Performed at [facility], [place] [address], [place], [place] [address]    Report Status PENDING  Incomplete  Culture, blood (routine x 2)     Status: None (Preliminary result)   Collection Time: 10/03/21  6:46 PM   Specimen: BLOOD RIGHT HAND  Result Value Ref Range Status   Specimen Description BLOOD RIGHT HAND  Final   Special Requests   Final    BOTTLES DRAWN AEROBIC AND ANAEROBIC Blood Culture adequate volume   Culture   Final    NO GROWTH < 12 HOURS Performed at [facility], [place] [address] [place], [place] [address]    Report Status PENDING  Incomplete     Scheduled Meds:  aspirin EC  81 mg Oral Daily   clopidogrel  75 mg Oral Daily   cycloSPORINE  1  drop Both Eyes BID   latanoprost  1 drop Both Eyes QHS   multivitamin  1 tablet Oral Daily   omega-3 acid ethyl esters  1 g Oral Daily   pantoprazole  40 mg Oral Daily   simvastatin  40 mg Oral Daily   Continuous Infusions:  sodium chloride 70 mL/hr at 10/04/21 0118   sodium chloride       LOS: 5 days   Cherene Altes, MD [facility] Office  [phone] Pager - Text Page per Shea Evans  If 7PM-7AM, please contact night-coverage per Amion 10/04/2021, 10:06 AM

## 2021-10-04 NOTE — Progress Notes (Signed)
STROKE TEAM PROGRESS NOTE   INTERVAL HISTORY Patient spiked fever 100.7 earlier today.  Chest x-ray shows right lower lobe infiltrate.  White count is yet elevated at 19.3 with coming down.  Patient has not been coughing.  Continues to have dysarthria and dense right hemiplegia.  Will see PCP today.  Vital signs are stable Vitals:   10/04/21 0409 10/04/21 0840 10/04/21 1001 10/04/21 1149  BP: (!) 159/93 (!) 142/74  (!) 107/54  Pulse: 97 (!) 104  88  Resp: 18 (!) 24  (!) 24  Temp: 98.6 F (37 C) 98.9 F (37.2 C) (!) 100.7 F (38.2 C) 98.9 F (37.2 C)  TempSrc: Oral Oral Oral Oral  SpO2: 98% 98%  96%  Weight:      Height:       CBC:  Recent Labs  Lab 10/02/21 0233 10/04/21 0333  WBC 23.9* 19.3*  NEUTROABS 20.5* 15.8*  HGB 12.6* 12.7*  HCT 37.4* 36.3*  MCV 92.6 91.4  PLT 265 350   Basic Metabolic Panel:  Recent Labs  Lab 09/30/21 0231 10/02/21 0233 10/04/21 0333  NA  --  136 137  K  --  3.7 3.6  CL  --  111 110  CO2  --  18* 18*  GLUCOSE  --  145* 118*  BUN  --  30* 21  CREATININE  --  1.18 1.02  CALCIUM  --  8.6* 8.6*  MG 2.2  --   --    Lipid Panel:  Recent Labs  Lab 09/29/21 1534  CHOL 153  TRIG 153*  HDL 58  CHOLHDL 2.6  VLDL 31  LDLCALC 64   HgbA1c:  Recent Labs  Lab 09/29/21 0446  HGBA1C 5.8*   Urine Drug Screen:  Recent Labs  Lab 09/29/21 1549  LABOPIA NONE DETECTED  COCAINSCRNUR NONE DETECTED  LABBENZ NONE DETECTED  AMPHETMU NONE DETECTED  THCU NONE DETECTED  LABBARB NONE DETECTED    Alcohol Level  Recent Labs  Lab 09/29/21 1534  ETH <10    IMAGING past 24 hours DG Chest Port 1 View  Result Date: 10/04/2021 CLINICAL DATA:  Dysphagia.  Question aspiration. EXAM: PORTABLE CHEST 1 VIEW COMPARISON:  Radiographs 10/01/2021 and 11/14/2020. FINDINGS: 1039 hours. The heart size and mediastinal contours are stable. Chronic interstitial prominence is noted at both lung bases, perhaps slightly worse on the right over the interval. There  is no pleural effusion or pneumothorax. No acute osseous findings are identified. Probable chondroid lesion in the proximal right humeral diaphysis, incompletely visualized. Telemetry leads overlie the chest. IMPRESSION: Possible increased right basilar opacity compared with prior studies which could represent early aspiration. No consolidation. Electronically Signed   By: Richardean Sale M.D.   On: 10/04/2021 11:11    PHYSICAL EXAM  Physical Exam  Constitutional: Pleasant elderly Caucasian male cardiovascular: Normal rate and regular rhythm.  Respiratory: Effort normal, non-labored breathing, on room air  Neuro: Mental Status: Patient is awake, alert, oriented to person, place, month, year, and situation. Speech is moderately dysarthric but can be understood without difficulty No neglect Cranial Nerves: II: Visual Fields are full. Pupils are equal, round, and reactive to light.   III,IV, VI: EOMI without ptosis or diploplia.  V: Facial sensation is symmetric to temperature VII: Moderate right lower facial weakness.   VIII: Hearing is intact to voice X: Palate elevates symmetrically XI: Shoulder shrug is symmetric. XII: Tongue protrudes midline without atrophy or fasciculations.  Motor: Tone is normal. Bulk is normal.  Dense  right hemiplegia with 0/5 right upper and lower extremity strength with trace withdrawal in the right leg but none in the arm.  Orbits left over right upper extremity. Sensory: Sensation is symmetric to light touch and temperature in the arms and legs. No extinction to DSS present.  Cerebellar: Slowed rapid alternating movements with right upper extremity.   ASSESSMENT/PLAN [name] is a 88 y.o. male with history of HTN, HLD, CAD, right shoulder surgery and glaucoma presenting with new onset of right sided weakness, difficulty ambulating and slurred speech since Thursday.  Patient recently started acetazolamide on Tuesday for his glaucoma and states  that he thought his symptoms were initially related to that, however his daughter called the ophthalmologist on Friday when she noticed how weak and slurred his speech was and they stated that this was not an expected side effect and she was encouraged to take him to the ED.  He did fall on Friday as well.   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 Code Stroke- No acute abnormality. Small vessel disease. Atrophy.  CTA head & neck Severe right proximal PCA stenosis with downstream underfilling. No acute infarct in this distribution by preceding MRI. MRI small acute pontine and corpus callosum infarcts, moderate to severe chronic small vessel ischemic disease and cerebral atrophy, chronic right parietal hemorrhagic infarct 2D Echo EF 65 to 70%  carotid ultrasound-not needed seek CT angiogram LDL 64 HgbA1c 5.8 VTE prophylaxis -SCDs    Diet   Diet full liquid Room service appropriate? Yes; Fluid consistency: Thin   aspirin 81 mg daily prior to admission, now on aspirin 81 mg daily and clopidogrel 75 mg daily for 3 weeks and then Plavix alone Therapy recommendations: Home health physical therapy Disposition: Pending  Hypertension Home meds: Metoprolol tartrate 12.5 mg twice daily Stable Permissive hypertension (OK if < 220/120) but gradually normalize in 5-7 days Long-term BP goal normotensive  Hyperlipidemia Home meds: Simvastatin 40 mg, resumed in hospital LDL 64, goal < 70 Continue statin at discharge   Other Stroke Risk Factors Advanced Age >/= 75  Hx stroke/TIA Chronic right parietal hemorrhagic infarct Coronary artery disease  Other Active Problems Glaucoma Follows with ophthalmology outpatient  Hospital day # 5   Patient .unfortunately has shown worsening of dysarthria and right hemiplegia likely due to developing  aspiration pneumonia.  Continue IV hydration.  Start antibiotics.  Discussed with Dr. Thereasa Solo medical hospitalist..  Long discussion with patient and daughter at the bedside and answered questions.  Greater than 50% time during this 35-minute visit was spent on counseling and coordination of care about his lacunar strokes and discussion about stroke prevention, treatment and answering questions.     Antony Contras, MD Medical Director [facility] Pager: [phone] 10/04/2021 1:17 PM  To contact Stroke Continuity provider, please refer to [email or web address] After hours, contact General Neurology

## 2021-10-05 DIAGNOSIS — R1311 Dysphagia, oral phase: Secondary | ICD-10-CM

## 2021-10-05 DIAGNOSIS — R270 Ataxia, unspecified: Secondary | ICD-10-CM

## 2021-10-05 DIAGNOSIS — T17908D Unspecified foreign body in respiratory tract, part unspecified causing other injury, subsequent encounter: Secondary | ICD-10-CM

## 2021-10-05 LAB — CBC
HCT: 34.2 % — ABNORMAL LOW (ref 39.0–52.0)
Hemoglobin: 11.3 g/dL — ABNORMAL LOW (ref 13.0–17.0)
MCH: 30.8 pg (ref 26.0–34.0)
MCHC: 33 g/dL (ref 30.0–36.0)
MCV: 93.2 fL (ref 80.0–100.0)
Platelets: 228 10*3/uL (ref 150–400)
RBC: 3.67 MIL/uL — ABNORMAL LOW (ref 4.22–5.81)
RDW: 13.4 % (ref 11.5–15.5)
WBC: 24.5 10*3/uL — ABNORMAL HIGH (ref 4.0–10.5)
nRBC: 0 % (ref 0.0–0.2)

## 2021-10-05 LAB — COMPREHENSIVE METABOLIC PANEL
ALT: 9 U/L (ref 0–44)
AST: 13 U/L — ABNORMAL LOW (ref 15–41)
Albumin: 2.4 g/dL — ABNORMAL LOW (ref 3.5–5.0)
Alkaline Phosphatase: 81 U/L (ref 38–126)
Anion gap: 13 (ref 5–15)
BUN: 28 mg/dL — ABNORMAL HIGH (ref 8–23)
CO2: 15 mmol/L — ABNORMAL LOW (ref 22–32)
Calcium: 8.3 mg/dL — ABNORMAL LOW (ref 8.9–10.3)
Chloride: 109 mmol/L (ref 98–111)
Creatinine, Ser: 1.17 mg/dL (ref 0.61–1.24)
GFR, Estimated: >60 mL/min (ref >60)
Glucose, Bld: 137 mg/dL — ABNORMAL HIGH (ref 70–99)
Potassium: 3.5 mmol/L (ref 3.5–5.1)
Sodium: 137 mmol/L (ref 135–145)
Total Bilirubin: 1.6 mg/dL — ABNORMAL HIGH (ref 0.3–1.2)
Total Protein: 6.2 g/dL — ABNORMAL LOW (ref 6.5–8.1)

## 2021-10-05 LAB — PROCALCITONIN: Procalcitonin: 1.52 ng/mL

## 2021-10-05 MED ORDER — SODIUM CHLORIDE 0.9 % IV SOLN
3.0000 g | Freq: Four times a day (QID) | INTRAVENOUS | Status: AC
  Administered 2021-10-05 – 2021-10-08 (×14): 3 g via INTRAVENOUS
  Filled 2021-10-05 (×14): qty 8

## 2021-10-05 NOTE — Plan of Care (Signed)
  Problem: Activity: Goal: Risk for activity intolerance will decrease Outcome: Progressing   Problem: Pain Managment: Goal: General experience of comfort will improve Outcome: Progressing   Problem: Safety: Goal: Ability to remain free from injury will improve Outcome: Progressing   

## 2021-10-05 NOTE — Progress Notes (Addendum)
Clarence Myers  ATF:[id number] DOB: February 16, 1934 DOA: 09/29/2021 PCP: Mayra Neer, MD    Brief Narrative:  [phone] with a history of HTN, CAD, HLD, and glaucoma who presented to the ED with right-sided weakness and ataxia with some slurring of speech which was present upon waking in the morning.  MRI brain in the ED noted small acute pontine and corpus callosum infarcts.  After the patient was admitted he suffered the acute onset of worsening right upper extremity focal weakness.  Stat CT head was without acute change.  Consultants:  Neurology  Code Status: FULL CODE  Antimicrobials:  None  DVT prophylaxis: SCDs  Interim Hx: No recurrence of fever.  Blood pressure stable.  Saturation 96% room air.  WBC remains quite elevated at 24.5.  Procalcitonin actually significantly increased today to 1.52.  Patient cleared for thin liquid by SLP yesterday.  Significantly more alert and interactive today.  Some slurring of slow intentional speech appreciated.  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  1 of 2 blood cultures positive Staph epi Most consistent with a contaminant - monitor clinically w/o directed abx tx   Moderate AoS Noted on TTE this admit - murmer noted on exam - not hemodynamically significant clinically   Hypokalemia Corrected with supplementation  HTN Practicing permissive hypertension for now  HLD Continue medical therapy  History of CAD  Family Communication: Spoke with son-in-law at bedside Disposition: From home  - likely will require at least inpatient rehab but possibly SNF rehab stay  Objective: Blood pressure (!) 130/104, pulse 92, temperature 98.5 F (36.9 C), temperature source Oral, resp. rate 16, height 5\' 3"  (1.6 m), weight 61.8 kg, SpO2 96 %.  Intake/Output Summary (Last 24 hours) at 10/05/2021 1051 Last data filed at 10/05/2021 0636 Gross per 24 hour  Intake 4172.06 ml  Output 600 ml  Net 3572.06 ml    Filed Weights   09/29/21 1518  Weight: 61.8 kg    Examination: General: No acute respiratory distress -sedate Lungs: Crackles in right base with good air movement throughout other fields with no wheezing Cardiovascular: RRR w/ 2/6 holosystolic M Abdomen: NT/ND, soft, BS positive, no rebound Extremities: No significant cyanosis, clubbing, or edema bilateral lower extremities  CBC: Recent Labs  Lab 09/29/21 1534 09/29/21 1549 10/02/21 0233 10/04/21 0333 10/05/21 0242  WBC 9.1  --  23.9* 19.3* 24.5*  NEUTROABS 5.3  --  20.5* 15.8*  --   HGB 12.7*   < > 12.6* 12.7* 11.3*  HCT 40.1   < > 37.4* 36.3* 34.2*  MCV 98.0  --  92.6 91.4 93.2  PLT 293  --  265 235 228   < > = values in this interval not displayed.    Basic Metabolic Panel: Recent Labs  Lab 09/30/21 0231 10/02/21 0233 10/04/21 0333 10/05/21 0242  NA  --  136 137 137  K  --  3.7 3.6 3.5  CL  --  111 110 109  CO2  --  18* 18* 15*  GLUCOSE  --  145* 118* 137*  BUN  --  30* 21 28*  CREATININE  --  1.18 1.02 1.17  CALCIUM  --  8.6* 8.6* 8.3*  MG 2.2  --   --   --     GFR: Estimated Creatinine Clearance: 37.1 mL/min (by C-G formula based on SCr of 1.17 mg/dL).  Liver Function Tests: Recent Labs  Lab 09/29/21 1534 10/05/21 0242  AST 16 13*  ALT 13 9  ALKPHOS 55 81  BILITOT 0.5 1.6*  PROT 7.1 6.2*  ALBUMIN 3.7 2.4*     Coagulation Profile: Recent Labs  Lab 09/29/21 1534  INR 1.0      HbA1C: Hgb A1c MFr Bld  Date/Time Value Ref Range Status  09/29/2021 04:46 AM 5.8 (H) 4.8 - 5.6 %  Final    Comment:    (NOTE) Pre diabetes:          5.7%-6.4%  Diabetes:              >6.4%  Glycemic control for   <7.0% adults with diabetes     Recent Results (from the past 240 hour(s))  Resp Panel by RT-PCR (Flu A&B, Covid) Nasopharyngeal Swab     Status: None   Collection Time: 09/29/21  3:17 PM   Specimen: Nasopharyngeal Swab; Nasopharyngeal(NP) swabs in vial transport medium  Result Value Ref Range Status   SARS Coronavirus 2 by RT PCR NEGATIVE NEGATIVE Final    Comment: (NOTE) SARS-CoV-2 target nucleic acids are NOT DETECTED.  The SARS-CoV-2 RNA is generally detectable in upper respiratory specimens during the acute phase of infection. The lowest concentration of SARS-CoV-2 viral copies this assay can detect is 138 copies/mL. A negative result does not preclude SARS-Cov-2 infection and should not be used as the sole basis for treatment or other patient management decisions. A negative result may occur with  improper specimen collection/handling, submission of specimen other than nasopharyngeal swab, presence of viral mutation(s) within the areas targeted by this assay, and inadequate number of viral copies(<138 copies/mL). A negative result must be combined with clinical observations, patient history, and epidemiological information. The expected result is Negative.  Fact Sheet for Patients:  EntrepreneurPulse.com.au  Fact Sheet for Healthcare Providers:  IncredibleEmployment.be  This test is no t yet approved or cleared by the [place] FDA and  has been authorized for detection and/or diagnosis of SARS-CoV-2 by FDA under an Emergency Use Authorization (EUA). This EUA will remain  in effect (meaning this test can be used) for the duration of the COVID-19 declaration under Section 564(b)(1) of the Act, 21 U.S.C.section 360bbb-3(b)(1), unless the authorization is terminated  or revoked sooner.       Influenza A by PCR  NEGATIVE NEGATIVE Final   Influenza B by PCR NEGATIVE NEGATIVE Final    Comment: (NOTE) The Xpert Xpress SARS-CoV-2/FLU/RSV plus assay is intended as an aid in the diagnosis of influenza from Nasopharyngeal swab specimens and should not be used as a sole basis for treatment. Nasal washings and aspirates are unacceptable for Xpert Xpress SARS-CoV-2/FLU/RSV testing.  Fact Sheet for Patients: EntrepreneurPulse.com.au  Fact Sheet for Healthcare Providers: IncredibleEmployment.be  This test is not yet approved or cleared by the [place] FDA and has been authorized for detection and/or diagnosis of SARS-CoV-2 by FDA under an Emergency Use Authorization (EUA). This EUA will remain in effect (meaning this test can be used) for the duration of the COVID-19 declaration under Section 564(b)(1) of the Act, 21 U.S.C. section 360bbb-3(b)(1), unless the authorization is terminated or revoked.  Performed at [facility]  [facility], [place] [address], [place], [place] [address]   Culture, blood (routine x 2)     Status: None (Preliminary result)   Collection Time: 10/03/21  6:37 PM   Specimen: BLOOD  Result Value Ref Range Status   Specimen Description BLOOD BLOOD RIGHT FOREARM  Final   Special Requests   Final    BOTTLES DRAWN AEROBIC AND ANAEROBIC Blood Culture adequate volume   Culture   Final    NO GROWTH 2 DAYS Performed at [facility], [place] [address], [place], [place] [address]    Report Status PENDING  Incomplete  Culture, blood (routine x 2)     Status: Abnormal (Preliminary result)   Collection Time: 10/03/21  6:46 PM   Specimen: BLOOD RIGHT HAND  Result Value Ref Range Status   Specimen Description BLOOD RIGHT HAND  Final   Special Requests   Final    BOTTLES DRAWN AEROBIC AND ANAEROBIC Blood Culture adequate volume   Culture  Setup Time   Final    GRAM POSITIVE COCCI IN BOTH AEROBIC AND ANAEROBIC BOTTLES CRITICAL RESULT CALLED TO, READ  BACK BY AND VERIFIED WITH: PHARMD G. Aris Lot [id number] @2216  FH    Culture (A)  Final    STAPHYLOCOCCUS EPIDERMIDIS THE SIGNIFICANCE OF ISOLATING THIS ORGANISM FROM A SINGLE SET OF BLOOD CULTURES WHEN MULTIPLE SETS ARE DRAWN IS UNCERTAIN. PLEASE NOTIFY THE MICROBIOLOGY DEPARTMENT WITHIN ONE WEEK IF SPECIATION AND SENSITIVITIES ARE REQUIRED. Performed at [facility], [place] [address] [place], [place] [address]    Report Status PENDING  Incomplete  Blood Culture ID Panel (Reflexed)     Status: Abnormal   Collection Time: 10/03/21  6:46 PM  Result Value Ref Range Status   Enterococcus faecalis NOT DETECTED NOT DETECTED Final   Enterococcus Faecium NOT DETECTED NOT DETECTED Final   Listeria monocytogenes NOT DETECTED NOT DETECTED Final   Staphylococcus species DETECTED (A) NOT DETECTED Final    Comment: CRITICAL RESULT CALLED TO, READ BACK BY AND VERIFIED WITH: PHARMD G. Aris Lot [id number] @2216  FH    Staphylococcus aureus (BCID) NOT DETECTED NOT DETECTED Final   Staphylococcus epidermidis DETECTED (A) NOT DETECTED Final    Comment: CRITICAL RESULT CALLED TO, READ BACK BY AND VERIFIED WITH: PHARMD G. Aris Lot [id number] @2216  FH    Staphylococcus lugdunensis NOT DETECTED NOT DETECTED Final   Streptococcus species NOT DETECTED NOT DETECTED Final   Streptococcus agalactiae NOT DETECTED NOT DETECTED Final   Streptococcus pneumoniae NOT DETECTED NOT DETECTED Final   Streptococcus pyogenes NOT DETECTED NOT DETECTED Final   A.calcoaceticus-baumannii NOT DETECTED NOT DETECTED Final   Bacteroides fragilis NOT DETECTED NOT DETECTED Final   Enterobacterales NOT DETECTED NOT DETECTED Final   Enterobacter cloacae complex NOT DETECTED NOT DETECTED Final   Escherichia coli NOT DETECTED NOT DETECTED Final   Klebsiella aerogenes NOT DETECTED NOT DETECTED Final   Klebsiella oxytoca NOT DETECTED NOT DETECTED Final   Klebsiella pneumoniae NOT DETECTED NOT DETECTED Final   Proteus species NOT DETECTED NOT DETECTED  Final   Salmonella species NOT DETECTED NOT DETECTED Final   Serratia marcescens NOT DETECTED NOT DETECTED Final   Haemophilus influenzae NOT DETECTED NOT DETECTED Final   Neisseria meningitidis NOT DETECTED NOT DETECTED Final   Pseudomonas aeruginosa NOT DETECTED NOT DETECTED Final   Stenotrophomonas maltophilia NOT DETECTED NOT DETECTED Final   Candida albicans NOT DETECTED NOT DETECTED Final   Candida auris NOT DETECTED NOT DETECTED Final   Candida glabrata NOT DETECTED  NOT DETECTED Final   Candida krusei NOT DETECTED NOT DETECTED Final   Candida parapsilosis NOT DETECTED NOT DETECTED Final   Candida tropicalis NOT DETECTED NOT DETECTED Final   Cryptococcus neoformans/gattii NOT DETECTED NOT DETECTED Final   Methicillin resistance mecA/C NOT DETECTED NOT DETECTED Final    Comment: Performed at [facility], [place] [address], [place], [place] [address]     Scheduled Meds:  aspirin EC  81 mg Oral Daily   clopidogrel  75 mg Oral Daily   cycloSPORINE  1 drop Both Eyes BID   latanoprost  1 drop Both Eyes QHS   multivitamin  1 tablet Oral Daily   omega-3 acid ethyl esters  1 g Oral Daily   pantoprazole  40 mg Oral Daily   simvastatin  40 mg Oral Daily   Continuous Infusions:  sodium chloride 1,000 mL (10/05/21 0738)   ampicillin-sulbactam (UNASYN) IV       LOS: 6 days   Cherene Altes, MD [facility] Office  [phone] Pager - Text Page per Shea Evans  If 7PM-7AM, please contact night-coverage per Amion 10/05/2021, 10:51 AM

## 2021-10-05 NOTE — Progress Notes (Signed)
Speech Language Pathology Treatment: Dysphagia;Cognitive-Linquistic  Patient Details Name: Clarence Myers MRN: [id number] DOB: 1933-10-30 Today's Date: 10/05/2021 Time: 4709-6283 SLP Time Calculation (min) (ACUTE ONLY): 16 min  Assessment / Plan / Recommendation Clinical Impression  The patient was alert, cooperative and pleasant throughout his dysphagia and dysarthria treatment session. Patient's spouse and daughter present throughout the duration of the session. Patient is able to engage in self-feeding with min assist required. Pt tolerated individual sips of thin liquids via straw cup with inconsistent s/sx of aspiration including delayed and immediate coughing. Pt tolerated puree and regular textures without overt s/sx of aspiration. Mastication was [facility]-Saline with mild oral residue noted. Pt able to clear oral residue with liquid and subsequent swallows. Patients diet will be advanced to Dys 3 with Nectar Thick Liquids. NTL recommended at this time d/t continued aspiration pneumonia concerns.   Patient and family members engaged in SLOP technique (slow, loud, over-articulate, pause) to address patient's dysarthria goals in functional manner. Patient used technique at the word, phrase, and spontaneous conversation levels. He benefited from min-mod verbal cues to remind him of the strategies. Patient independently used dysarthria techniques 3x. Family members educated on reminding patient to be loud and over-articulate sounds in words to improve intelligibility across contexts. SLP will continue to follow pt for management and treatment of dysphagia and dysarthria.   HPI HPI: Patient is a 88 y/o male who presents on 09/29/21 with slurred speech, weakness and difficulty walking; s/p 2 falls at home. 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 Found to have hypokalemia as well. PMH includes CAD, low back strain. Lungs clear per CXR taken 2/12.      SLP Plan  Continue with current plan of care       Recommendations for follow up therapy are one component of a multi-disciplinary discharge planning process, led by the attending physician.  Recommendations may be updated based on patient status, additional functional criteria and insurance authorization.    Recommendations  Diet recommendations: Dysphagia 3 (mechanical soft);Nectar-thick liquid Liquids provided via: Straw Medication Administration: Crushed with puree Supervision: Patient able to self feed;Staff to assist with self feeding Compensations: Minimize environmental distractions;Slow rate;Small sips/bites Postural Changes and/or Swallow Maneuvers: Seated upright 90 degrees                Oral Care Recommendations: Oral care BID Follow Up Recommendations: Acute inpatient rehab (3hours/day) Assistance recommended at discharge: Intermittent Supervision/Assistance SLP Visit Diagnosis: Dysphagia, oropharyngeal phase (R13.12);Dysarthria and anarthria (R47.1) Plan: Continue with current plan of care           [facility]  10/05/2021, 1:57 PM

## 2021-10-05 NOTE — Progress Notes (Signed)
Inpatient Rehabilitation Admissions Coordinator   I met at bedside with patient ,wife and daughter. I discussed with them that he is not functionally ready for a CIR admit at this time and that other rehab venue options would need to be considered. They still are hopeful that once he is medically improved, that he can be reassessed. I will follow .  Danne Baxter, RN, MSN Rehab Admissions Coordinator [phone] 10/05/2021 12:40 PM

## 2021-10-05 NOTE — Progress Notes (Signed)
STROKE TEAM PROGRESS NOTE   INTERVAL HISTORY Patient having low garde fevr today.  Chest x-ray shows right lower lobe infiltrate.  White count is yet elevated at 25.3 he has been started on Zosyn for aspiration pneumonia.  Oxygen sats are ok on room air.Patient has not been coughing.  Continues to have dysarthria and dense right hemiplegia.   .  Vital signs are stable.neuro exam is unchanged. Vitals:   10/04/21 2050 10/04/21 2319 10/05/21 0314 10/05/21 0736  BP:  130/67 (!) 120/59 (!) 130/104  Pulse:  97 86 92  Resp:  16 16 16   Temp: 99 F (37.2 C) 99 F (37.2 C) 99.1 F (37.3 C) 98.5 F (36.9 C)  TempSrc:  Oral Oral Oral  SpO2:  96% 96% 96%  Weight:      Height:       CBC:  Recent Labs  Lab 10/02/21 0233 10/04/21 0333 10/05/21 0242  WBC 23.9* 19.3* 24.5*  NEUTROABS 20.5* 15.8*  --   HGB 12.6* 12.7* 11.3*  HCT 37.4* 36.3* 34.2*  MCV 92.6 91.4 93.2  PLT 265 235 664   Basic Metabolic Panel:  Recent Labs  Lab 09/30/21 0231 10/02/21 0233 10/04/21 0333 10/05/21 0242  NA  --    < > 137 137  K  --    < > 3.6 3.5  CL  --    < > 110 109  CO2  --    < > 18* 15*  GLUCOSE  --    < > 118* 137*  BUN  --    < > 21 28*  CREATININE  --    < > 1.02 1.17  CALCIUM  --    < > 8.6* 8.3*  MG 2.2  --   --   --    < > = values in this interval not displayed.   Lipid Panel:  Recent Labs  Lab 09/29/21 1534  CHOL 153  TRIG 153*  HDL 58  CHOLHDL 2.6  VLDL 31  LDLCALC 64   HgbA1c:  Recent Labs  Lab 09/29/21 0446  HGBA1C 5.8*   Urine Drug Screen:  Recent Labs  Lab 09/29/21 1549  LABOPIA NONE DETECTED  COCAINSCRNUR NONE DETECTED  LABBENZ NONE DETECTED  AMPHETMU NONE DETECTED  THCU NONE DETECTED  LABBARB NONE DETECTED    Alcohol Level  Recent Labs  Lab 09/29/21 1534  ETH <10    IMAGING past 24 hours DG Swallowing Func-Speech Pathology  Result Date: 10/04/2021 Table formatting from the original result was not included. Objective Swallowing Evaluation: Type of  Study: MBS-Modified Barium Swallow Study  Patient Details Name: Clarence Myers MRN: [id number] Date of Birth: 05-23-1934 Today's Date: 10/04/2021 Time: SLP Start Time (ACUTE ONLY): 1215 -SLP Stop Time (ACUTE ONLY): 1230 SLP Time Calculation (min) (ACUTE ONLY): 15 min Past Medical History: Past Medical History: Diagnosis Date  CAD (coronary artery disease)   Hyperlipidemia   Low back strain   Shoulder injury   Fell and injured Right shoulder Past Surgical History: Past Surgical History: Procedure Laterality Date  ANGIOPLASTY    1992-LAD  CARDIAC CATHETERIZATION   HPI: Patient is a 88 y/o male who presents on 09/29/21 with slurred speech, weakness and difficulty walking; s/p 2 falls at home. 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 Found to have hypokalemia as well. PMH includes CAD, low back strain. Lungs clear per CXR taken 2/12.  Subjective: lethargic, requiring frequent cues to maintain alertness  Recommendations for follow up therapy are  one component of a multi-disciplinary discharge planning process, led by the attending physician.  Recommendations may be updated based on patient status, additional functional criteria and insurance authorization. Assessment / Plan / Recommendation Clinical Impressions 10/04/2021 Clinical Impression Patient presents with a mild-moderate oropharyngeal dysphagia which was highly impacted today by him being very lethargic. SLP was only able to administer one cup sip of each: thin, nectar thick, puree. He exhibited oral transit delays with all consistencies, swallow initiation delays to level of vallecular sinus with all consistencies. With puree solids, he exhibited mild vallecular residuals which cleared with subsequent swallows and with nectar thick and thin liquids, exhibited trace to mild vallecular residuals and trace pyriform residuals. One instance of flash penetration (PAS 2) observed during the swallow with thin liquids but no aspiration observed. Cricopharyngeal  phase of swallow appeared [facility]. SLP is recommending to continue on full liquids (thin) and will be able to trial upgraded solids at bedside. SLP Visit Diagnosis Dysphagia, oropharyngeal phase (R13.12) Attention and concentration deficit following -- Frontal lobe and executive function deficit following -- Impact on safety and function Moderate aspiration risk;Risk for inadequate nutrition/hydration;Mild aspiration risk   Treatment Recommendations 10/04/2021 Treatment Recommendations Therapy as outlined in treatment plan below   Prognosis 10/04/2021 Prognosis for Safe Diet Advancement Good Barriers to Reach Goals -- Barriers/Prognosis Comment -- Diet Recommendations 10/04/2021 SLP Diet Recommendations Other (Comment);Thin liquid Liquid Administration via Cup Medication Administration Crushed with puree Compensations Minimize environmental distractions;Slow rate;Small sips/bites Postural Changes Seated upright at 90 degrees   Other Recommendations 10/04/2021 Recommended Consults -- Oral Care Recommendations Oral care BID;Staff/trained caregiver to provide oral care Other Recommendations -- Follow Up Recommendations Acute inpatient rehab (3hours/day) Assistance recommended at discharge Intermittent Supervision/Assistance Functional Status Assessment Patient has had a recent decline in their functional status and demonstrates the ability to make significant improvements in function in a reasonable and predictable amount of time. Frequency and Duration  10/04/2021 Speech Therapy Frequency (ACUTE ONLY) min 2x/week Treatment Duration 1 week   Oral Phase 10/04/2021 Oral Phase Impaired Oral - Pudding Teaspoon -- Oral - Pudding Cup -- Oral - Honey Teaspoon -- Oral - Honey Cup -- Oral - Nectar Teaspoon -- Oral - Nectar Cup Weak lingual manipulation;Reduced posterior propulsion;Delayed oral transit Oral - Nectar Straw -- Oral - Thin Teaspoon -- Oral - Thin Cup Weak lingual manipulation;Delayed oral transit;Reduced posterior propulsion  Oral - Thin Straw -- Oral - Puree Reduced posterior propulsion;Delayed oral transit;Weak lingual manipulation;Lingual/palatal residue Oral - Mech Soft -- Oral - Regular -- Oral - Multi-Consistency -- Oral - Pill -- Oral Phase - Comment --  Pharyngeal Phase 10/04/2021 Pharyngeal Phase Impaired Pharyngeal- Pudding Teaspoon -- Pharyngeal -- Pharyngeal- Pudding Cup -- Pharyngeal -- Pharyngeal- Honey Teaspoon -- Pharyngeal -- Pharyngeal- Honey Cup -- Pharyngeal -- Pharyngeal- Nectar Teaspoon -- Pharyngeal -- Pharyngeal- Nectar Cup Delayed swallow initiation-vallecula;Pharyngeal residue - valleculae Pharyngeal -- Pharyngeal- Nectar Straw -- Pharyngeal -- Pharyngeal- Thin Teaspoon -- Pharyngeal -- Pharyngeal- Thin Cup Delayed swallow initiation-vallecula;Pharyngeal residue - valleculae;Penetration/Aspiration during swallow Pharyngeal Material enters airway, remains ABOVE vocal cords then ejected out Pharyngeal- Thin Straw -- Pharyngeal -- Pharyngeal- Puree Delayed swallow initiation-vallecula;Pharyngeal residue - valleculae;Pharyngeal residue - pyriform Pharyngeal -- Pharyngeal- Mechanical Soft -- Pharyngeal -- Pharyngeal- Regular -- Pharyngeal -- Pharyngeal- Multi-consistency -- Pharyngeal -- Pharyngeal- Pill -- Pharyngeal -- Pharyngeal Comment --  Cervical Esophageal Phase  10/04/2021 Cervical Esophageal Phase WFL Pudding Teaspoon -- Pudding Cup -- Honey Teaspoon -- Honey Cup -- Nectar Teaspoon -- Nectar Cup -- [employer]  Straw -- Thin Teaspoon -- Thin Cup -- Thin Straw -- Puree -- Mechanical Soft -- Regular -- Multi-consistency -- Pill -- Cervical Esophageal Comment -- Sonia Baller, MA, CCC-SLP Speech Therapy                      PHYSICAL EXAM  Physical Exam  Constitutional: Pleasant elderly Caucasian male cardiovascular: Normal rate and regular rhythm.  Respiratory: Effort normal, non-labored breathing, on room air  Neuro: Mental Status: Patient is awake, alert, oriented to person, place, month, year, and  situation. Speech is moderately dysarthric but can be understood without difficulty No neglect Cranial Nerves: II: Visual Fields are full. Pupils are equal, round, and reactive to light.   III,IV, VI: EOMI without ptosis or diploplia.  V: Facial sensation is symmetric to temperature VII: Moderate right lower facial weakness.   VIII: Hearing is intact to voice X: Palate elevates symmetrically XI: Shoulder shrug is symmetric. XII: Tongue protrudes midline without atrophy or fasciculations.  Motor: Tone is normal. Bulk is normal.  Dense right hemiplegia with 0/5 right upper and lower extremity strength with trace withdrawal in the right leg but none in the arm.  Orbits left over right upper extremity. Sensory: Sensation is symmetric to light touch and temperature in the arms and legs. No extinction to DSS present.  Cerebellar: Slowed rapid alternating movements with right upper extremity.   ASSESSMENT/PLAN Clarence Myers is a 88 y.o. male with history of HTN, HLD, CAD, right shoulder surgery and glaucoma presenting with new onset of right sided weakness, difficulty ambulating and slurred speech since Thursday.  Patient recently started acetazolamide on Tuesday for his glaucoma and states that he thought his symptoms were initially related to that, however his daughter called the ophthalmologist on Friday when she noticed how weak and slurred his speech was and they stated that this was not an expected side effect and she was encouraged to take him to the ED.  He did fall on Friday as well.   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 Code Stroke- No acute abnormality. Small vessel disease. Atrophy.  CTA head & neck Severe right proximal PCA stenosis with downstream underfilling. No acute infarct in this  distribution by preceding MRI. MRI small acute pontine and corpus callosum infarcts, moderate to severe chronic small vessel ischemic disease and cerebral atrophy, chronic right parietal hemorrhagic infarct 2D Echo EF 65 to 70%  carotid ultrasound-not needed seek CT angiogram LDL 64 HgbA1c 5.8 VTE prophylaxis -SCDs    Diet   Diet full liquid Room service appropriate? Yes; Fluid consistency: Thin   aspirin 81 mg daily prior to admission, now on aspirin 81 mg daily and clopidogrel 75 mg daily for 3 weeks and then Plavix alone Therapy recommendations: Home health physical therapy Disposition: Pending  Hypertension Home meds: Metoprolol tartrate 12.5 mg twice daily Stable Permissive hypertension (OK if < 220/120) but gradually normalize in 5-7 days Long-term BP goal normotensive  Hyperlipidemia Home meds: Simvastatin 40 mg, resumed in hospital LDL 64, goal < 70 Continue statin at discharge   Other Stroke Risk Factors Advanced Age >/= 69  Hx stroke/TIA Chronic right parietal hemorrhagic infarct Coronary artery disease  Other Active Problems Glaucoma Follows with ophthalmology outpatient Aspiration pneumonia- Zosyn started 10/04/21  Hospital day # 6   Patient .continues to  have severe  dysarthria and right hemiplegia likely due to developing aspiration pneumonia.  Continue IV hydration.  Continue Zosyn.  Discussed with Dr. Thereasa Solo medical hospitalist..  Long discussion with patient and daughter and son at the bedside and answered questions.  Greater than 50% time during this 35-minute visit was spent on counseling and coordination of care about his lacunar strokes and discussion about stroke prevention, treatment and answering questions.   Stroke team will sign off. Call for questions.  Antony Contras, MD Medical Director [facility] Pager: [phone] 10/05/2021 11:18 AM  To contact Stroke Continuity provider, please refer to [email or web address] After hours, contact  General Neurology

## 2021-10-05 NOTE — Progress Notes (Signed)
Physical Therapy Treatment Patient Details Name: Clarence Myers MRN: [id number] DOB: [birth year] Today's Date: 10/05/2021   History of Present Illness Patient is a 88 y/o male who presents on 09/29/21 with slurred speech, weakness and difficulty walking; s/p 2 falls at home. 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 Found to have hypokalemia as well. PMH includes CAD, low back strain.    PT Comments    Pt admitted with above diagnosis. Pt was able to sit EOB for up to 12 min with min guard to mod assist as he fatigued. Continues to need max assist of 2 to transfer with little activation of right hemibody.   Goals needed to be revised as pt did have incr right sided weakness 2 days ago therefore PT downgraded goals today. Pt currently with functional limitations due to balance and endurance deficits. Pt will benefit from skilled PT to increase their independence and safety with mobility to allow discharge to the venue listed below.      Recommendations for follow up therapy are one component of a multi-disciplinary discharge planning process, led by the attending physician.  Recommendations may be updated based on patient status, additional functional criteria and insurance authorization.  Follow Up Recommendations  Acute inpatient rehab (3hours/day)     Assistance Recommended at Discharge Frequent or constant Supervision/Assistance  Patient can return home with the following Assistance with cooking/housework;Assist for transportation;Help with stairs or ramp for entrance;Two people to help with walking and/or transfers;A lot of help with walking and/or transfers;A lot of help with bathing/dressing/bathroom   Equipment Recommendations  Other (comment) (TBA)    Recommendations for Other Services Rehab consult     Precautions / Restrictions Precautions Precautions: Fall Precaution Comments: hx of 2 recent falls Restrictions Weight Bearing Restrictions: No     Mobility  Bed  Mobility Overal bed mobility: Needs Assistance Bed Mobility: Supine to Sit Rolling: Min assist Sidelying to sit: Mod assist, HOB elevated       General bed mobility comments: pt unable to move R LE to EOB without assist, attempts to get pt to move the right LE using the left LE but difficult for pt to move it, modA for trunk elevation, unable to use R UE functionally during transfer and incr cues to use left UE in correct position.    Transfers Overall transfer level: Needs assistance Equipment used: 2 person hand held assist Transfers: Sit to/from Stand Sit to Stand: Max assist, +2 physical assistance   Step pivot transfers: Max assist, +2 physical assistance       General transfer comment: unable to use R UE to power up, R knee blocked, maxA to power up and weight shift to step towards chair, completed 2 sit to stand transfers with pt having difficulty standing fully upright. Did not note much activation of right UE or LE    Ambulation/Gait               General Gait Details: pt unable to amb today due to significant R sided weakness   Stairs             Wheelchair Mobility    Modified Rankin (Stroke Patients Only) Modified Rankin (Stroke Patients Only) Pre-Morbid Rankin Score: Moderate disability Modified Rankin: Severe disability     Balance Overall balance assessment: Needs assistance, History of Falls Sitting-balance support: Feet supported, Single extremity supported Sitting balance-Leahy Scale: Poor Sitting balance - Comments: posterior lean, right lateral lean, modA to maintain EOB balance with periods of min  guard for seconds at a time with cuing, pt also falling forward with onset of fatigue.  Pt sat EOB for up to 10 min Postural control: Posterior lean, Right lateral lean Standing balance support: Bilateral upper extremity supported Standing balance-Leahy Scale: Poor Standing balance comment: required external support of 2 persons with right  hemibody weakness.                            Cognition Arousal/Alertness: Awake/alert Behavior During Therapy: WFL for tasks assessed/performed Overall Cognitive Status: Impaired/Different from baseline Area of Impairment: Attention, Memory, Following commands, Safety/judgement, Problem solving                   Current Attention Level: Selective Memory: Decreased short-term memory Following Commands: Follows one step commands with increased time Safety/Judgement: Decreased awareness of safety, Decreased awareness of deficits   Problem Solving: Slow processing, Difficulty sequencing, Requires verbal cues General Comments: pt aware of worsening R sided weakness, pt emotional aat times but does also joke with PT and OT. pt follows commands majority of time        Exercises General Exercises - Upper Extremity Shoulder Flexion: AROM, Left, 5 reps, Supine Elbow Flexion: AROM, Left, 5 reps, Supine General Exercises - Lower Extremity Ankle Circles/Pumps: AROM, Left, 5 reps, Supine Long Arc Quad: AROM, Left, 10 reps, Seated Heel Slides: AROM, Left, 10 reps, Supine Other Exercises Other Exercises: P/ROM right UE and LE all planes    General Comments General comments (skin integrity, edema, etc.): Family present and VSS      Pertinent Vitals/Pain Pain Assessment Pain Assessment: No/denies pain    Home Living                          Prior Function            PT Goals (current goals can now be found in the care plan section) Acute Rehab PT Goals Patient Stated Goal: to go home PT Goal Formulation: With patient Time For Goal Achievement: 10/19/21 Potential to Achieve Goals: Fair Progress towards PT goals: Not Progressing toward goals, goals downgraded    Frequency    Min 4X/week      PT Plan Current plan remains appropriate    Co-evaluation              AM-PAC PT "6 Clicks" Mobility   Outcome Measure  Help needed turning from  your back to your side while in a flat bed without using bedrails?: A Lot Help needed moving from lying on your back to sitting on the side of a flat bed without using bedrails?: A Lot Help needed moving to and from a bed to a chair (including a wheelchair)?: Total Help needed standing up from a chair using your arms (e.g., wheelchair or bedside chair)?: Total Help needed to walk in hospital room?: Total Help needed climbing 3-5 steps with a railing? : Total 6 Click Score: 8    End of Session Equipment Utilized During Treatment: Gait belt Activity Tolerance: Patient tolerated treatment well Patient left: with call bell/phone within reach;in chair;with chair alarm set;with family/visitor present Nurse Communication: Mobility status (use of Stedy may benefit for transfers) PT Visit Diagnosis: History of falling (Z91.81);Other abnormalities of gait and mobility (R26.89);Unsteadiness on feet (R26.81)     Time: 8115-7262 PT Time Calculation (min) (ACUTE ONLY): 18 min  Charges:  $Therapeutic Activity: 8-22 mins  [facility],PT Acute Rehab Services [phone] (pager)    Alvira Philips 10/05/2021, 10:35 AM

## 2021-10-06 ENCOUNTER — Inpatient Hospital Stay (HOSPITAL_COMMUNITY): Payer: PPO

## 2021-10-06 LAB — CBC
HCT: 30.7 % — ABNORMAL LOW (ref 39.0–52.0)
Hemoglobin: 10.3 g/dL — ABNORMAL LOW (ref 13.0–17.0)
MCH: 31 pg (ref 26.0–34.0)
MCHC: 33.6 g/dL (ref 30.0–36.0)
MCV: 92.5 fL (ref 80.0–100.0)
Platelets: 229 10*3/uL (ref 150–400)
RBC: 3.32 MIL/uL — ABNORMAL LOW (ref 4.22–5.81)
RDW: 13.7 % (ref 11.5–15.5)
WBC: 20.4 10*3/uL — ABNORMAL HIGH (ref 4.0–10.5)
nRBC: 0 % (ref 0.0–0.2)

## 2021-10-06 LAB — COMPREHENSIVE METABOLIC PANEL
ALT: 9 U/L (ref 0–44)
AST: 11 U/L — ABNORMAL LOW (ref 15–41)
Albumin: 2 g/dL — ABNORMAL LOW (ref 3.5–5.0)
Alkaline Phosphatase: 77 U/L (ref 38–126)
Anion gap: 8 (ref 5–15)
BUN: 34 mg/dL — ABNORMAL HIGH (ref 8–23)
CO2: 19 mmol/L — ABNORMAL LOW (ref 22–32)
Calcium: 8.1 mg/dL — ABNORMAL LOW (ref 8.9–10.3)
Chloride: 113 mmol/L — ABNORMAL HIGH (ref 98–111)
Creatinine, Ser: 1.06 mg/dL (ref 0.61–1.24)
GFR, Estimated: >60 mL/min (ref >60)
Glucose, Bld: 103 mg/dL — ABNORMAL HIGH (ref 70–99)
Potassium: 3.2 mmol/L — ABNORMAL LOW (ref 3.5–5.1)
Sodium: 140 mmol/L (ref 135–145)
Total Bilirubin: 0.7 mg/dL (ref 0.3–1.2)
Total Protein: 5.6 g/dL — ABNORMAL LOW (ref 6.5–8.1)

## 2021-10-06 LAB — CULTURE, BLOOD (ROUTINE X 2): Special Requests: ADEQUATE

## 2021-10-06 LAB — MAGNESIUM: Magnesium: 2.1 mg/dL (ref 1.7–2.4)

## 2021-10-06 IMAGING — DX DG CHEST 1V PORT
1 series · 1 of 1 positions shown · non-contrast
Comparison: [DATE].

CLINICAL DATA: Pneumonia.

EXAM:
PORTABLE CHEST 1 VIEW

[chest]
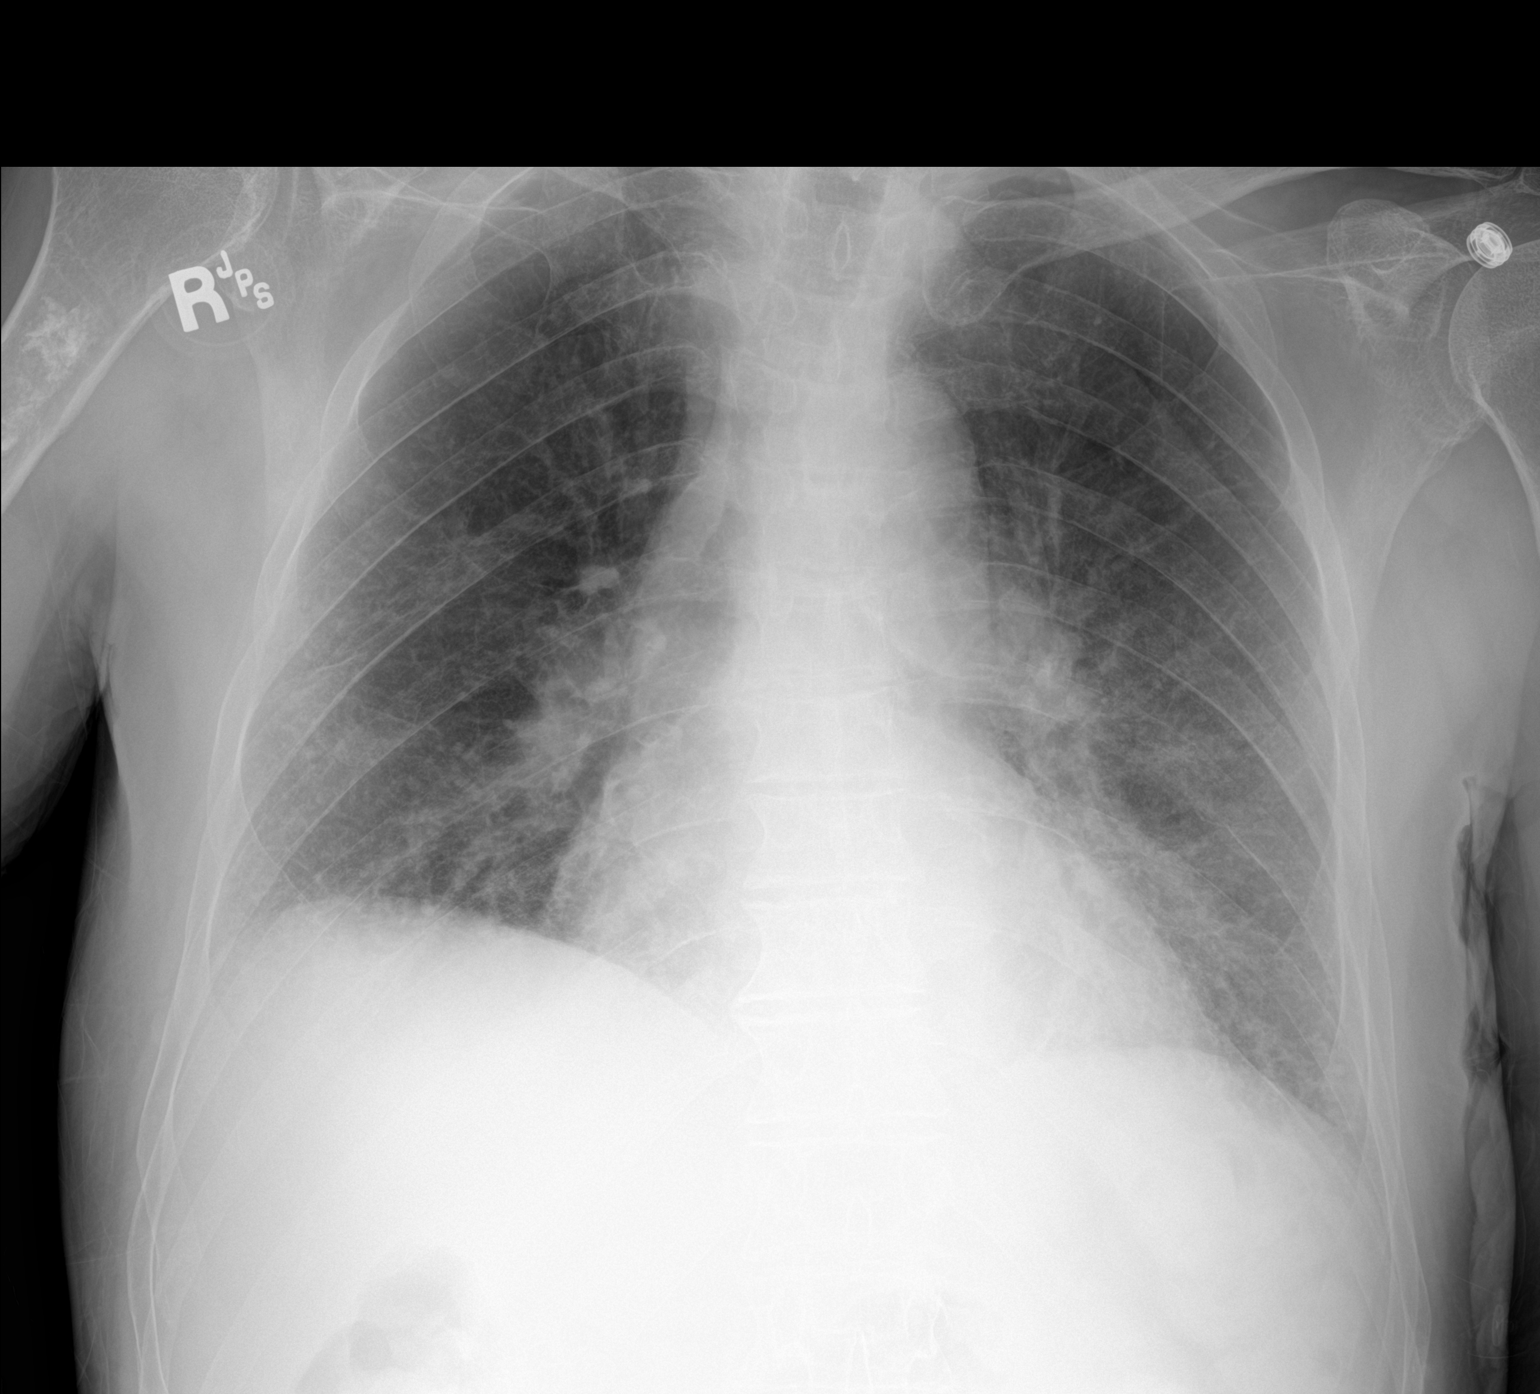

[1 of 1 positions shown; findings below may reference images not displayed]

FINDINGS: Stable cardiomediastinal silhouette. Stable bibasilar opacities are
noted concerning for subsegmental atelectasis or possibly
infiltrates. Bony thorax is unremarkable.
IMPRESSION: Stable bibasilar subsegmental atelectasis or infiltrates.

## 2021-10-06 MED ORDER — POTASSIUM CHLORIDE CRYS ER 20 MEQ PO TBCR
40.0000 meq | EXTENDED_RELEASE_TABLET | Freq: Once | ORAL | Status: AC
Start: 2021-10-06 — End: 2021-10-06
  Administered 2021-10-06: 40 meq via ORAL
  Filled 2021-10-06: qty 2

## 2021-10-06 MED ORDER — POTASSIUM CHLORIDE IN NACL 40-0.9 MEQ/L-% IV SOLN
INTRAVENOUS | Status: DC
  Filled 2021-10-06 (×6): qty 1000

## 2021-10-06 MED ORDER — CHLORHEXIDINE GLUCONATE 0.12 % MT SOLN
15.0000 mL | Freq: Two times a day (BID) | OROMUCOSAL | Status: DC
  Administered 2021-10-06 – 2021-10-10 (×10): 15 mL via OROMUCOSAL
  Filled 2021-10-06 (×10): qty 15

## 2021-10-06 MED ORDER — ORAL CARE MOUTH RINSE
15.0000 mL | Freq: Two times a day (BID) | OROMUCOSAL | Status: DC
  Administered 2021-10-06 – 2021-10-09 (×5): 15 mL via OROMUCOSAL

## 2021-10-06 NOTE — Progress Notes (Signed)
Clarence Myers  VFI:[id number] DOB: Aug 03, 1934 DOA: 09/29/2021 PCP: Mayra Neer, MD    Brief Narrative:  [phone] with a history of HTN, CAD, HLD, and glaucoma who presented to the ED with right-sided weakness and ataxia with some slurring of speech which was present upon waking in the morning.  MRI brain in the ED noted small acute pontine and corpus callosum infarcts.  After the patient was admitted he suffered the acute onset of worsening right upper extremity focal weakness.  Stat CT head was without acute change.  Consultants:  Neurology  Code Status: FULL CODE  Antimicrobials:  None  DVT prophylaxis: SCDs  Interim Hx: Afebrile overnight.  Vital signs stable.  Saturation 99% on room air.  WBC improved slightly from 24 > 20.  CXR this morning on my interpretation notes stability of right basilar infiltrate.  No new events reported overnight.  Sitting up in bed eating lunch at the time of my visit.  Appears improved.  No new complaints.  Speech remains slurred at times and patient appears to have ongoing word finding difficulty but he is able to effectively communicate.  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 -worsened due to evolution of stroke during admission Felt to be due to small vessel disease - work-up completed by Neurology - dual antiplatelet therapy continues for 3 weeks then Plavix alone  Slurred speech - dysphagia Waxing and waning during this admission with some intermittent dysphagia -diet as per SLP at this time -tolerating intake but does suffer occasional episodes of coughing  Aspiration pneumonia right lower lobe Initiated empiric antibiotic therapy 2/15 -follow-up CXR today confirms stability -clinically improving -discontinue antibiotics after 5 days of therapy if continues to clinically improve  1 of 2 blood cultures positive Staph epi Most consistent with a contaminant - monitor clinically w/o directed abx tx   Moderate  AoS Noted on TTE this admit - murmer noted on exam - not hemodynamically significant clinically   Hypokalemia Recurring -supplement further and follow -magnesium is normal  HTN Began to lower blood pressure as appropriate  HLD Continue medical therapy  History of CAD  Family Communication: Spoke with wife and daughter at bedside at length Disposition: From home - appears SNF rehab stay is most appropriate at this time with probable readiness for discharge Monday  Objective: Blood pressure (!) 142/60, pulse 66, temperature 98.4 F (36.9 C), temperature source Oral, resp. rate 17, height 5\' 3"  (1.6 m), weight 61.8 kg, SpO2 99 %.  Intake/Output Summary (Last 24 hours) at 10/06/2021 1108 Last data filed at 10/06/2021 0445 Gross per 24 hour  Intake 667.73 ml  Output 2110 ml  Net -1442.27 ml    Filed Weights   09/29/21 1518  Weight: 61.8 kg    Examination: General: No acute respiratory distress -much more alert Lungs: Crackles in right base with good air movement throughout other fields with no wheezing Cardiovascular: RRR w/ 2/6 holosystolic M without change Abdomen: NT/ND, soft, BS positive, no rebound Extremities: No edema bilateral lower extremities  CBC: Recent Labs  Lab 09/29/21 1534 09/29/21 1549 10/02/21 0233 10/04/21 0333 10/05/21 0242 10/06/21 0351  WBC 9.1  --  23.9* 19.3* 24.5* 20.4*  NEUTROABS 5.3  --  20.5* 15.8*  --   --   HGB 12.7*   < > 12.6* 12.7* 11.3* 10.3*  HCT 40.1   < > 37.4* 36.3* 34.2* 30.7*  MCV 98.0  --  92.6 91.4 93.2 92.5  PLT 293  --  265 235 228 229   < > = values in this interval not displayed.    Basic Metabolic Panel: Recent Labs  Lab 09/30/21 0231 10/02/21 0233 10/04/21 0333 10/05/21 0242 10/06/21 0351  NA  --    < > 137 137 140  K  --    < > 3.6 3.5 3.2*  CL  --    < > 110 109 113*  CO2  --    < > 18* 15* 19*  GLUCOSE  --    < > 118* 137* 103*  BUN  --    < > 21 28* 34*  CREATININE  --    < > 1.02 1.17 1.06  CALCIUM   --    < > 8.6* 8.3* 8.1*  MG 2.2  --   --   --  2.1   < > = values in this interval not displayed.    GFR: Estimated Creatinine Clearance: 41 mL/min (by C-G formula based on SCr of 1.06 mg/dL).  Liver Function Tests: Recent Labs  Lab 09/29/21 1534 10/05/21 0242 10/06/21 0351  AST 16 13* 11*  ALT 13 9 9   ALKPHOS 55 81 77  BILITOT 0.5 1.6* 0.7  PROT 7.1 6.2* 5.6*  ALBUMIN 3.7 2.4* 2.0*     Coagulation Profile: Recent Labs  Lab 09/29/21 1534  INR 1.0      HbA1C: Hgb A1c MFr Bld  Date/Time Value Ref Range Status  09/29/2021 04:46 AM 5.8 (H) 4.8 - 5.6 % Final    Comment:    (NOTE) Pre diabetes:          5.7%-6.4%  Diabetes:              >6.4%  Glycemic control for   <7.0% adults with diabetes      Scheduled Meds:  aspirin EC  81 mg Oral Daily   chlorhexidine  15 mL Mouth Rinse BID   clopidogrel  75 mg Oral Daily   cycloSPORINE  1 drop Both Eyes BID   latanoprost  1 drop Both Eyes QHS   mouth rinse  15 mL Mouth Rinse q12n4p   multivitamin  1 tablet Oral Daily   omega-3 acid ethyl esters  1 g Oral Daily   pantoprazole  40 mg Oral Daily   simvastatin  40 mg Oral Daily   Continuous Infusions:  sodium chloride 100 mL/hr at 10/06/21 0520   ampicillin-sulbactam (UNASYN) IV 3 g (10/06/21 0726)     LOS: 7 days   Cherene Altes, MD [facility] Office  [phone] Pager - Text Page per Shea Evans  If 7PM-7AM, please contact night-coverage per Amion 10/06/2021, 11:08 AM

## 2021-10-06 NOTE — NC FL2 (Signed)
[place] LEVEL OF CARE SCREENING TOOL     IDENTIFICATION  Patient Name: Clarence Myers Birthdate: [birth year] Sex: male Admission Date (Current Location): 09/29/2021  [facility] and [place] Number:  Herbalist and Address:  [place], [place] [address] [place], [place] [address]      Provider Number: [id number]  Attending Physician Name and Address:  Cherene Altes, MD  Relative Name and Phone Number:       Current Level of Care: Hospital Recommended Level of Care: [place] Prior Approval Number:    Date Approved/Denied:   PASRR Number: [id number]  Discharge Plan: SNF    Current Diagnoses: Patient Active Problem List   Diagnosis Date Noted   Slurred speech    CVA (cerebral vascular accident) ([place]) 09/29/2021   Essential hypertension 09/29/2021   Hypokalemia 09/29/2021   CAD (coronary artery disease)    Hyperlipidemia     Orientation RESPIRATION BLADDER Height & Weight     Self, Time, Situation, Place  Normal Incontinent Weight: 136 lb 3.9 oz (61.8 kg) Height:  5\' 3"  (160 cm)  BEHAVIORAL SYMPTOMS/MOOD NEUROLOGICAL BOWEL NUTRITION STATUS      Incontinent Diet (see DC summary)  AMBULATORY STATUS COMMUNICATION OF NEEDS Skin   Extensive Assist Verbally Skin abrasions                       Personal Care Assistance Level of Assistance  Bathing, Feeding, Dressing Bathing Assistance: Maximum assistance Feeding assistance: Limited assistance Dressing Assistance: Maximum assistance     Functional Limitations Info  Sight, Speech Sight Info: Impaired   Speech Info: Impaired    SPECIAL CARE FACTORS FREQUENCY  PT (By licensed PT), OT (By licensed OT), Speech therapy     PT Frequency: 5x/wk OT Frequency: 5x/wk     Speech Therapy Frequency: 5x/wk      Contractures Contractures Info: Not present    Additional Factors Info  Code Status, Allergies Code Status Info: Full Allergies  Info: Blue Crab (Cavinectes Sapidus) Allergy Skin Test, Other           Current Medications (10/06/2021):  This is the current hospital active medication list Current Facility-Administered Medications  Medication Dose Route Frequency Provider Last Rate Last Admin   0.9 %  sodium chloride infusion   Intravenous Continuous Cherene Altes, MD 100 mL/hr at 10/06/21 0520 New Bag at 10/06/21 0520   acetaminophen (TYLENOL) tablet 650 mg  650 mg Oral Q4H PRN Chotiner, Yevonne Aline, MD   650 mg at 10/04/21 1628   Ampicillin-Sulbactam (UNASYN) 3 g in sodium chloride 0.9 % 100 mL IVPB  3 g Intravenous Q6H Wellborn, Vincente Liberty B, RPH 200 mL/hr at 10/06/21 0726 3 g at 10/06/21 0726   aspirin EC tablet 81 mg  81 mg Oral Daily Chotiner, Yevonne Aline, MD   81 mg at 10/05/21 1030   chlorhexidine (PERIDEX) 0.12 % solution 15 mL  15 mL Mouth Rinse BID Joette Catching T, MD   15 mL at 10/06/21 0207   clopidogrel (PLAVIX) tablet 75 mg  75 mg Oral Daily Janine Ores, NP   75 mg at 10/05/21 1030   cycloSPORINE (RESTASIS) 0.05 % ophthalmic emulsion 1 drop  1 drop Both Eyes BID Chotiner, Yevonne Aline, MD   1 drop at 10/05/21 2134   latanoprost (XALATAN) 0.005 % ophthalmic solution 1 drop  1 drop Both Eyes QHS Chotiner, Yevonne Aline, MD   1 drop at  10/05/21 2155   lip balm (CARMEX) ointment   Topical PRN Swayze, Ava, DO   1 application at 76/81/15 0955   MEDLINE mouth rinse  15 mL Mouth Rinse q12n4p Cherene Altes, MD       multivitamin (PROSIGHT) tablet 1 tablet  1 tablet Oral Daily Chotiner, Yevonne Aline, MD   1 tablet at 10/05/21 1030   omega-3 acid ethyl esters (LOVAZA) capsule 1 g  1 g Oral Daily Chotiner, Yevonne Aline, MD   1 g at 10/05/21 1031   ondansetron (ZOFRAN) injection 4 mg  4 mg Intravenous Q6H PRN Swayze, Ava, DO       pantoprazole (PROTONIX) EC tablet 40 mg  40 mg Oral Daily Chotiner, Yevonne Aline, MD   40 mg at 10/05/21 1030   senna-docusate (Senokot-S) tablet 1 tablet  1 tablet Oral QHS PRN Chotiner, Yevonne Aline, MD        simvastatin (ZOCOR) tablet 40 mg  40 mg Oral Daily Chotiner, Yevonne Aline, MD   40 mg at 10/05/21 1031   traMADol (ULTRAM) tablet 50 mg  50 mg Oral Q6H PRN Cherene Altes, MD         Discharge Medications: Please see discharge summary for a list of discharge medications.  Relevant Imaging Results:  Relevant Lab Results:   Additional Information SS#: [id number]  Geralynn Ochs, LCSW

## 2021-10-06 NOTE — Progress Notes (Signed)
Physical Therapy Treatment Patient Details Name: Clarence Myers MRN: [id number] DOB: 1933/10/23 Today's Date: 10/06/2021   History of Present Illness Patient is a 88 y/o male who presents on 09/29/21 with slurred speech, weakness and difficulty walking; s/p 2 falls at home. 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 Found to have hypokalemia as well. PMH includes CAD, low back strain.    PT Comments    Pt admitted with above diagnosis. Pt was able to stand to [facility] with max assist of 2 persons. Pt with trace movement in right LE today in hip extension and hip flexion. Pt continues to need assist with sitting balance. Pt progressing slowly.  Pt currently with functional limitations due to balance and endurance deficits. Pt will benefit from skilled PT to increase their independence and safety with mobility to allow discharge to the venue listed below.      Recommendations for follow up therapy are one component of a multi-disciplinary discharge planning process, led by the attending physician.  Recommendations may be updated based on patient status, additional functional criteria and insurance authorization.  Follow Up Recommendations  Acute inpatient rehab (3hours/day)     Assistance Recommended at Discharge Frequent or constant Supervision/Assistance  Patient can return home with the following Assistance with cooking/housework;Assist for transportation;Help with stairs or ramp for entrance;Two people to help with walking and/or transfers;A lot of help with walking and/or transfers;A lot of help with bathing/dressing/bathroom   Equipment Recommendations  Other (comment) (TBA)    Recommendations for Other Services Rehab consult     Precautions / Restrictions Precautions Precautions: Fall Precaution Comments: hx of 2 recent falls Restrictions Weight Bearing Restrictions: No     Mobility  Bed Mobility Overal bed mobility: Needs Assistance Bed Mobility: Supine to  Sit Rolling: Min assist Sidelying to sit: Mod assist, HOB elevated, +2 for physical assistance       General bed mobility comments: required assistance of 2 for safety.  Pt moving right LE trace to command  needing cues and assisrt to reach for right rasil and roll right.  Pt was able to assist right LE using left LE to move the right LE off the bed with overall mod assist of 2 assist to come to EOB.    Transfers Overall transfer level: Needs assistance Equipment used: Ambulation equipment used Transfers: Sit to/from Stand, Bed to chair/wheelchair/BSC Sit to Stand: Max assist, +2 physical assistance           General transfer comment: max assist x2 to stand into stedy from bed.  Pt needed assist to place the right hand on the [place]. Pt also needed assist to power up and to stay upright once on his feet. Pt did have some activation right hip extensors with knee weakness noted.  Moved pt to the chair and pt stood again x 1 with assist prior to sitting in chair. Transfer via Lift Equipment: Stedy  Ambulation/Gait                   Stairs             Wheelchair Mobility    Modified Rankin (Stroke Patients Only) Modified Rankin (Stroke Patients Only) Pre-Morbid Rankin Score: Moderate disability Modified Rankin: Severe disability     Balance Overall balance assessment: Needs assistance, History of Falls Sitting-balance support: Feet supported, Single extremity supported Sitting balance-Leahy Scale: Poor Sitting balance - Comments: varied from mod assist to min guard for sitting balance but only a few seconds of  supporting self Postural control: Posterior lean, Right lateral lean Standing balance support: Bilateral upper extremity supported Standing balance-Leahy Scale: Poor Standing balance comment: reliant on Stedy and 2 person assist`                            Cognition Arousal/Alertness: Awake/alert Behavior During Therapy: WFL for tasks  assessed/performed Overall Cognitive Status: Impaired/Different from baseline Area of Impairment: Attention, Memory, Following commands, Safety/judgement, Problem solving                   Current Attention Level: Selective Memory: Decreased short-term memory Following Commands: Follows one step commands with increased time Safety/Judgement: Decreased awareness of safety, Decreased awareness of deficits   Problem Solving: Slow processing, Difficulty sequencing, Requires verbal cues General Comments: follows commands with increased time        Exercises      General Comments General comments (skin integrity, edema, etc.): Family present and VSS      Pertinent Vitals/Pain Pain Assessment Pain Assessment: No/denies pain Faces Pain Scale: No hurt Breathing: normal Negative Vocalization: none Facial Expression: smiling or inexpressive Body Language: relaxed Consolability: no need to console PAINAD Score: 0    Home Living                          Prior Function            PT Goals (current goals can now be found in the care plan section) Progress towards PT goals: Progressing toward goals    Frequency    Min 4X/week      PT Plan Current plan remains appropriate    Co-evaluation PT/OT/SLP Co-Evaluation/Treatment: Yes Reason for Co-Treatment: Complexity of the patient's impairments (multi-system involvement);For patient/therapist safety PT goals addressed during session: Mobility/safety with mobility OT goals addressed during session: ADL's and self-care      AM-PAC PT "6 Clicks" Mobility   Outcome Measure  Help needed turning from your back to your side while in a flat bed without using bedrails?: A Lot Help needed moving from lying on your back to sitting on the side of a flat bed without using bedrails?: A Lot Help needed moving to and from a bed to a chair (including a wheelchair)?: Total Help needed standing up from a chair using your  arms (e.g., wheelchair or bedside chair)?: Total Help needed to walk in hospital room?: Total Help needed climbing 3-5 steps with a railing? : Total 6 Click Score: 8    End of Session Equipment Utilized During Treatment: Gait belt Activity Tolerance: Patient tolerated treatment well Patient left: with call bell/phone within reach;in chair;with chair alarm set;with family/visitor present Nurse Communication: Mobility status (use of Stedy may benefit for transfers) PT Visit Diagnosis: History of falling (Z91.81);Other abnormalities of gait and mobility (R26.89);Unsteadiness on feet (R26.81)     Time: 4650-3546 PT Time Calculation (min) (ACUTE ONLY): 29 min  Charges:  $Therapeutic Activity: 8-22 mins                     Aiyden Lauderback M,PT Acute Rehab Services [phone] (pager)    Alvira Philips 10/06/2021, 12:22 PM

## 2021-10-06 NOTE — TOC Initial Note (Signed)
Transition of Care (TOC) - Initial/Assessment Note  ° ° °Patient Details  °Name: Clarence Myers °MRN: [id number] °Date of Birth: 06/18/1934 ° °Transition of Care (TOC) CM/SW Contact:    °Elizabeth M Paisley, LCSW °Phone Number: °10/06/2021, 1:17 PM ° °Clinical Narrative:       CSW met with patient's spouse and daughter at bedside to discuss possible need for SNF placement. Family still hopeful that patient will improve to go to CIR, but agreeable to review SNF options if needed. Family unfamiliar with SNF choices, so CSW discussed suggestions for researching options available. Family to review options and CSW to follow.          ° ° °Expected Discharge Plan: Skilled Nursing Facility °Barriers to Discharge: Continued Medical Work up, Insurance Authorization ° ° °Patient Goals and CMS Choice °Patient states their goals for this hospitalization and ongoing recovery are:: to get rehab °CMS Medicare.gov Compare Post Acute Care list provided to:: Patient Represenative (must comment) °Choice offered to / list presented to : Adult Children, Spouse ° °Expected Discharge Plan and Services °Expected Discharge Plan: Skilled Nursing Facility °  °  °Post Acute Care Choice: IP Rehab °Living arrangements for the past 2 months: Single Family Home °                °  °  °  °  °  °  °  °  °  °  ° °Prior Living Arrangements/Services °Living arrangements for the past 2 months: Single Family Home °Lives with:: Spouse °Patient language and need for interpreter reviewed:: No °Do you feel safe going back to the place where you live?: Yes      °Need for Family Participation in Patient Care: Yes (Comment) °Care giver support system in place?: Yes (comment) °  °Criminal Activity/Legal Involvement Pertinent to Current Situation/Hospitalization: No - Comment as needed ° °Activities of Daily Living °Home Assistive Devices/Equipment: Dentures (specify type), Eyeglasses (partial upper) °ADL Screening (condition at time of admission) °Patient's  cognitive ability adequate to safely complete daily activities?: Yes °Is the patient deaf or have difficulty hearing?: No °Does the patient have difficulty seeing, even when wearing glasses/contacts?: No °Does the patient have difficulty concentrating, remembering, or making decisions?: No °Patient able to express need for assistance with ADLs?: No °Does the patient have difficulty dressing or bathing?: No °Independently performs ADLs?: Yes (appropriate for developmental age) °Communication: Independent °Dressing (OT): Needs assistance °Is this a change from baseline?: Pre-admission baseline °Grooming: Needs assistance °Is this a change from baseline?: Pre-admission baseline °Feeding: Independent °Bathing: Needs assistance °Is this a change from baseline?: Pre-admission baseline °Toileting: Needs assistance °Is this a change from baseline?: Pre-admission baseline °In/Out Bed: Needs assistance °Is this a change from baseline?: Pre-admission baseline °Walks in Home: Needs assistance °Is this a change from baseline?: Pre-admission baseline °Does the patient have difficulty walking or climbing stairs?: Yes °Weakness of Legs: Both °Weakness of Arms/Hands: Both ° °Permission Sought/Granted °Permission sought to share information with : Facility Contact Representative, Family Supports °Permission granted to share information with : Yes, Verbal Permission Granted ° Share Information with NAME: Kathleen, Vicki, Lisa ° Permission granted to share info w AGENCY: SNF ° Permission granted to share info w Relationship: Spouse, Daughters °   ° °Emotional Assessment °Appearance:: Appears stated age °Attitude/Demeanor/Rapport: Engaged °Affect (typically observed): Appropriate °Orientation: : Oriented to Self, Oriented to Place, Oriented to  Time, Oriented to Situation °Alcohol / Substance Use: Not Applicable °Psych Involvement: No (comment) ° °Admission diagnosis:    Slurred speech [R47.81] °Ataxia [R27.0] °CVA (cerebral vascular  accident) ([facility]) [I63.9] °Acute CVA (cerebrovascular accident) ([facility]) [I63.9] °Patient Active Problem List  ° Diagnosis Date Noted  ° Slurred speech   ° CVA (cerebral vascular accident) ([facility]) 09/29/2021  ° Essential hypertension 09/29/2021  ° Hypokalemia 09/29/2021  ° CAD (coronary artery disease)   ° Hyperlipidemia   ° °PCP:  Shaw, Kimberlee, MD °Pharmacy:   °[facility] - [place], [place] - [address] [address] [address] °[address] °[place] [address] °Phone: [phone] Fax: [phone] ° ° ° ° °Social Determinants of Health (SDOH) Interventions °  ° °Readmission Risk Interventions °No flowsheet data found. ° ° °

## 2021-10-06 NOTE — Plan of Care (Signed)
  Problem: Education: Goal: Knowledge of General Education information will improve Description: Including pain rating scale, medication(s)/side effects and non-pharmacologic comfort measures Outcome: Progressing   Problem: Nutrition: Goal: Adequate nutrition will be maintained Outcome: Progressing   Problem: Safety: Goal: Ability to remain free from injury will improve Outcome: Progressing   

## 2021-10-06 NOTE — Progress Notes (Signed)
Occupational Therapy Treatment Patient Details Name: Clarence Myers MRN: [id number] DOB: [birth year] Today's Date: 10/06/2021   History of present illness Patient is a 87 y/o male who presents on 09/29/21 with slurred speech, weakness and difficulty walking; s/p 2 falls at home. 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 Found to have hypokalemia as well. PMH includes CAD, low back strain.   OT comments  Patient received in bed with daughter present and eager to participate in OT/PT session.  Patient was +2 for bed mobility and standing into Stedy. Patient was able to maintain balance on EOB for less than a minute and required mod to min guard for balance. Patient transferred to recliner via stedy and instructed daughter on addressing him on right. Acute OT to continue to follow.    Recommendations for follow up therapy are one component of a multi-disciplinary discharge planning process, led by the attending physician.  Recommendations may be updated based on patient status, additional functional criteria and insurance authorization.    Follow Up Recommendations  Acute inpatient rehab (3hours/day)    Assistance Recommended at Discharge Frequent or constant Supervision/Assistance  Patient can return home with the following  A lot of help with walking and/or transfers;A lot of help with bathing/dressing/bathroom;Direct supervision/assist for medications management;Help with stairs or ramp for entrance;Assist for transportation   Equipment Recommendations  Other (comment) (TBD)    Recommendations for Other Services      Precautions / Restrictions Precautions Precautions: Fall Precaution Comments: hx of 2 recent falls Restrictions Weight Bearing Restrictions: No       Mobility Bed Mobility Overal bed mobility: Needs Assistance Bed Mobility: Supine to Sit Rolling: Min assist Sidelying to sit: Mod assist, HOB elevated, +2 for physical assistance       General bed  mobility comments: required assistance of 2 for safety    Transfers Overall transfer level: Needs assistance Equipment used: Ambulation equipment used Transfers: Sit to/from Stand, Bed to chair/wheelchair/BSC Sit to Stand: Max assist, +2 physical assistance           General transfer comment: max assist x2 to stand into stedy from bed Transfer via Lift Equipment: Stedy   Balance Overall balance assessment: Needs assistance, History of Falls Sitting-balance support: Feet supported, Single extremity supported Sitting balance-Leahy Scale: Poor Sitting balance - Comments: varied from mod assist to min guard for sitting balance but only a few seconds of supporting self Postural control: Posterior lean, Right lateral lean Standing balance support: Bilateral upper extremity supported Standing balance-Leahy Scale: Poor Standing balance comment: reliant on Stedy and 2 person assist`                           ADL either performed or assessed with clinical judgement   ADL Overall ADL's : Needs assistance/impaired Eating/Feeding: Minimal assistance;Sitting Eating/Feeding Details (indicate cue type and reason): verbal cues to take small sips with drink Grooming: Wash/dry hands;Minimal assistance;Sitting Grooming Details (indicate cue type and reason): performed seated on EOB     Lower Body Bathing: Maximal assistance;+2 for physical assistance;+2 for safety/equipment;Sit to/from stand Lower Body Bathing Details (indicate cue type and reason): stood in stedy to clean bottom                       General ADL Comments: able to use LUE to assist    Extremity/Trunk Assessment Upper Extremity Assessment RUE Deficits / Details: minimal antigravity movements. unable to use functionally. unaware  of positioning RUE Sensation: WNL RUE Coordination: decreased fine motor;decreased gross motor LUE Deficits / Details: grossly 4/5, slow and deliberate coordination LUE Sensation:  WNL LUE Coordination: decreased fine motor            Vision       Perception     Praxis      Cognition Arousal/Alertness: Awake/alert Behavior During Therapy: WFL for tasks assessed/performed Overall Cognitive Status: Impaired/Different from baseline Area of Impairment: Attention, Memory, Following commands, Safety/judgement, Problem solving                   Current Attention Level: Selective Memory: Decreased short-term memory Following Commands: Follows one step commands with increased time Safety/Judgement: Decreased awareness of safety, Decreased awareness of deficits   Problem Solving: Slow processing, Difficulty sequencing, Requires verbal cues General Comments: follows commands with increased time        Exercises      Shoulder Instructions       General Comments      Pertinent Vitals/ Pain       Pain Assessment Faces Pain Scale: No hurt  Home Living                                          Prior Functioning/Environment              Frequency  Min 2X/week        Progress Toward Goals  OT Goals(current goals can now be found in the care plan section)  Progress towards OT goals: Progressing toward goals  Acute Rehab OT Goals Patient Stated Goal: get better OT Goal Formulation: With patient/family Time For Goal Achievement: 10/17/21 Potential to Achieve Goals: Good ADL Goals Pt Will Perform Grooming: with modified independence;standing Pt Will Perform Upper Body Dressing: with modified independence;sitting Pt Will Perform Lower Body Dressing: with supervision;sit to/from stand Pt Will Transfer to Toilet: with supervision;ambulating;regular height toilet  Plan Discharge plan remains appropriate    Co-evaluation    PT/OT/SLP Co-Evaluation/Treatment: Yes Reason for Co-Treatment: For patient/therapist safety   OT goals addressed during session: ADL's and self-care      AM-PAC OT "6 Clicks" Daily  Activity     Outcome Measure   Help from another person eating meals?: A Little Help from another person taking care of personal grooming?: A Lot Help from another person toileting, which includes using toliet, bedpan, or urinal?: A Lot Help from another person bathing (including washing, rinsing, drying)?: A Lot Help from another person to put on and taking off regular upper body clothing?: A Lot Help from another person to put on and taking off regular lower body clothing?: A Lot 6 Click Score: 13    End of Session Equipment Utilized During Treatment: Gait belt;Other (comment) Charlaine Dalton)  OT Visit Diagnosis: Unsteadiness on feet (R26.81);Other abnormalities of gait and mobility (R26.89);Muscle weakness (generalized) (M62.81);History of falling (Z91.81)   Activity Tolerance Patient tolerated treatment well   Patient Left in chair;with call bell/phone within reach;with chair alarm set;with family/visitor present   Nurse Communication Mobility status;Need for lift equipment        Time: [phone] OT Time Calculation (min): 29 min  Charges: OT General Charges $OT Visit: 1 Visit OT Treatments $Self Care/Home Management : 8-22 mins  Lodema Hong, [place]  Pager [phone] Office [place] 10/06/2021, 11:00 AM

## 2021-10-07 LAB — BASIC METABOLIC PANEL
Anion gap: 9 (ref 5–15)
BUN: 29 mg/dL — ABNORMAL HIGH (ref 8–23)
CO2: 18 mmol/L — ABNORMAL LOW (ref 22–32)
Calcium: 7.9 mg/dL — ABNORMAL LOW (ref 8.9–10.3)
Chloride: 113 mmol/L — ABNORMAL HIGH (ref 98–111)
Creatinine, Ser: 0.97 mg/dL (ref 0.61–1.24)
GFR, Estimated: >60 mL/min (ref >60)
Glucose, Bld: 109 mg/dL — ABNORMAL HIGH (ref 70–99)
Potassium: 3.5 mmol/L (ref 3.5–5.1)
Sodium: 140 mmol/L (ref 135–145)

## 2021-10-07 LAB — CBC
HCT: 31.7 % — ABNORMAL LOW (ref 39.0–52.0)
Hemoglobin: 10.5 g/dL — ABNORMAL LOW (ref 13.0–17.0)
MCH: 30.5 pg (ref 26.0–34.0)
MCHC: 33.1 g/dL (ref 30.0–36.0)
MCV: 92.2 fL (ref 80.0–100.0)
Platelets: 251 10*3/uL (ref 150–400)
RBC: 3.44 MIL/uL — ABNORMAL LOW (ref 4.22–5.81)
RDW: 13.8 % (ref 11.5–15.5)
WBC: 13.5 10*3/uL — ABNORMAL HIGH (ref 4.0–10.5)
nRBC: 0 % (ref 0.0–0.2)

## 2021-10-07 LAB — MAGNESIUM: Magnesium: 2 mg/dL (ref 1.7–2.4)

## 2021-10-07 MED ORDER — METOPROLOL TARTRATE 12.5 MG HALF TABLET
12.5000 mg | ORAL_TABLET | Freq: Two times a day (BID) | ORAL | Status: DC
  Administered 2021-10-07 – 2021-10-10 (×7): 12.5 mg via ORAL
  Filled 2021-10-07 (×7): qty 1

## 2021-10-07 NOTE — Progress Notes (Signed)
Pharmacy Antibiotic Note  Clarence Myers is a 88 y.o. male admitted on 09/29/2021 with stroke and the concern for aspiration pneumonia . WBC 13.5, SCr 0.97, afebrile. Pharmacy has been consulted for ampicillin/sulbactam dosing.  Plan: - Continue ampicillin sulbactam IV 3g Q6H - Monitor clinically status, cultures, and length of therapy - Follow plans to de-escalate length of therapy and transition to PO antibiotics   Height: 5\' 3"  (160 cm) Weight: 61.8 kg (136 lb 3.9 oz) IBW/kg (Calculated) : 56.9 kg  Temp (24hrs), Avg:98.8 F (37.1 C), Min:98.5 F (36.9 C), Max:99.3 F (37.4 C)  Recent Labs  Lab 10/02/21 0233 10/04/21 0333 10/05/21 0242 10/06/21 0351 10/07/21 0436  WBC 23.9* 19.3* 24.5* 20.4* 13.5*  CREATININE 1.18 1.02 1.17 1.06 0.97    Estimated Creatinine Clearance: 44.8 mL/min (by C-G formula based on SCr of 0.97 mg/dL).    Allergies  Allergen Reactions   Blue Crab (Cavinectes Sapidus) Allergy Skin Test     "Hives"    Other     Bee Stings    Antimicrobials this admission: 2/15 Zosyn >> 2/16 2/16 Unasyn >>  Dose adjustments this admission: N/A  Microbiology results: 2/10 COVID/Flu A & B: negative 2/14 Bcx: Staphylococcus epidermidis in 1/2 bottles (likely contaminant) 2/14 BCx: NGTD  Thank you for allowing pharmacy to be a part of this patients care.   Shauna Hugh, PharmD, [place]  PGY-2 Pharmacy Resident 10/07/2021 11:42 AM  Please check [email or web address] for unit-specific pharmacy phone numbers.

## 2021-10-07 NOTE — Progress Notes (Signed)
Clarence Myers  ZOX:[id number] DOB: 26-Oct-1933 DOA: 09/29/2021 PCP: Mayra Neer, MD    Brief Narrative:  [phone] with a history of HTN, CAD, HLD, and glaucoma who presented to the ED with right-sided weakness and ataxia with some slurring of speech which was present upon waking in the morning.  MRI brain in the ED noted small acute pontine and corpus callosum infarcts.  After the patient was admitted he suffered the acute onset of worsening right upper extremity focal weakness.  Stat CT head was without acute change.  Consultants:  Neurology  Code Status: FULL CODE  Antimicrobials:  None  DVT prophylaxis: SCDs  Interim Hx: Afebrile.  Blood pressure modestly elevated at 147-829 systolic.  Saturations 98% room air.  WBC markedly improved from 20.4 > 13.5.  BUN falling from 34 > 29.  Magnesium normal.  Appears stable/making steady slow improvement overall.  No respiratory distress.  Medically stabilizing nicely.  Extended discussion with family at bedside.  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 -worsened due to evolution of stroke during admission Felt to be due to small vessel disease - work-up completed by Neurology - dual antiplatelet therapy continues for 3 weeks then Plavix alone  Slurred speech - dysphagia Waxing and waning during this admission with some intermittent dysphagia -diet as per SLP at this time -tolerating intake but does suffer occasional episodes of coughing -appears to be stabilizing for now  Aspiration pneumonia right lower lobe Initiated empiric antibiotic therapy 2/15 -follow-up CXR 2/17 confirmed stability -clinically improving -discontinue antibiotics after 5 days of therapy if continues to clinically improve  1 of 2 blood cultures positive Staph epi Most consistent with a contaminant - monitor clinically w/o directed abx tx   Moderate AoS Noted on TTE this admit - murmer noted on exam - not hemodynamically significant  clinically -discussed with family who report the patient's family physician was aware of this and following up  Hypokalemia Recurring -improving with supplementation -magnesium normal  HTN Slowly begin to titrate blood pressure medications  HLD Continue medical therapy  History of CAD  Family Communication: Lengthy discussion with family at bedside Disposition: From home - appears SNF rehab stay is most appropriate at this time with probable readiness for discharge Monday  Objective: Blood pressure (!) 159/75, pulse 62, temperature 99 F (37.2 C), temperature source Oral, resp. rate 18, height 5\' 3"  (1.6 m), weight 61.8 kg, SpO2 98 %.  Intake/Output Summary (Last 24 hours) at 10/07/2021 0821 Last data filed at 10/07/2021 0409 Gross per 24 hour  Intake --  Output 1800 ml  Net -1800 ml    Filed Weights   09/29/21 1518  Weight: 61.8 kg    Examination: General: No acute respiratory distress -much more alert Lungs: Crackles in right base with good air movement throughout other fields with no wheezing Cardiovascular: RRR w/ 2/6 holosystolic M which is stable Abdomen: NT/ND, soft, BS positive, no rebound Extremities: No edema BLE  CBC: Recent Labs  Lab 10/02/21 0233 10/04/21 0333 10/05/21 0242 10/06/21 0351 10/07/21 0436  WBC 23.9* 19.3* 24.5* 20.4* 13.5*  NEUTROABS 20.5* 15.8*  --   --   --   HGB 12.6* 12.7* 11.3* 10.3* 10.5*  HCT 37.4* 36.3* 34.2* 30.7* 31.7*  MCV 92.6 91.4 93.2 92.5 92.2  PLT 265 235 228 229 562    Basic Metabolic Panel: Recent Labs  Lab 10/05/21 0242 10/06/21 0351 10/07/21 0436  NA 137 140 140  K 3.5 3.2* 3.5  CL 109 113* 113*  CO2 15* 19* 18*  GLUCOSE 137* 103* 109*  BUN 28* 34* 29*  CREATININE 1.17 1.06 0.97  CALCIUM 8.3* 8.1* 7.9*  MG  --  2.1 2.0    GFR: Estimated Creatinine Clearance: 44.8 mL/min (by C-G formula based on SCr of 0.97 mg/dL).  Liver Function Tests: Recent Labs  Lab 10/05/21 0242 10/06/21 0351  AST 13* 11*   ALT 9 9  ALKPHOS 81 77  BILITOT 1.6* 0.7  PROT 6.2* 5.6*  ALBUMIN 2.4* 2.0*    HbA1C: Hgb A1c MFr Bld  Date/Time Value Ref Range Status  09/29/2021 04:46 AM 5.8 (H) 4.8 - 5.6 % Final    Comment:    (NOTE) Pre diabetes:          5.7%-6.4%  Diabetes:              >6.4%  Glycemic control for   <7.0% adults with diabetes      Scheduled Meds:  aspirin EC  81 mg Oral Daily   chlorhexidine  15 mL Mouth Rinse BID   clopidogrel  75 mg Oral Daily   cycloSPORINE  1 drop Both Eyes BID   latanoprost  1 drop Both Eyes QHS   mouth rinse  15 mL Mouth Rinse q12n4p   multivitamin  1 tablet Oral Daily   omega-3 acid ethyl esters  1 g Oral Daily   pantoprazole  40 mg Oral Daily   simvastatin  40 mg Oral Daily   Continuous Infusions:  0.9 % NaCl with KCl 40 mEq / L 75 mL/hr at 10/07/21 0648   ampicillin-sulbactam (UNASYN) IV 3 g (10/07/21 0204)     LOS: 8 days   Cherene Altes, MD [facility] Office  [phone] Pager - Text Page per Shea Evans  If 7PM-7AM, please contact night-coverage per Amion 10/07/2021, 8:21 AM

## 2021-10-07 NOTE — TOC Progression Note (Signed)
Transition of Care [facility]) - Progression Note    Patient Details  Name: Clarence Myers MRN: [id number] Date of Birth: December 05, 1933  Transition of Care [facility]) CM/SW [place], [place] Phone Number: 10/07/2021, 1:23 PM  Clinical Narrative:   CSW received call from patient's son-in-law that family would prefer [facility] in [facility] for SNF, as his mother had been there for many years and they have church members that are there. [facility] referral is still pending. CSW sent message to [facility] Admissions to review referral and update CSW if they can offer a bed. CSW to follow.    Expected Discharge Plan: [place] Barriers to Discharge: Continued Medical Work up, Ship broker  Expected Discharge Plan and Services Expected Discharge Plan: [place]     Post Acute Care Choice: IP Rehab Living arrangements for the past 2 months: Single Family Home                                       Social Determinants of Health (SDOH) Interventions    Readmission Risk Interventions No flowsheet data found.

## 2021-10-07 NOTE — Plan of Care (Signed)
°  Problem: Clinical Measurements: Goal: Diagnostic test results will improve Outcome: Progressing   Problem: Safety: Goal: Ability to remain free from injury will improve Outcome: Progressing   Problem: Education: Goal: Knowledge of patient specific risk factors will improve (INDIVIDUALIZE FOR PATIENT) Outcome: Progressing

## 2021-10-08 LAB — BASIC METABOLIC PANEL
Anion gap: 9 (ref 5–15)
BUN: 20 mg/dL (ref 8–23)
CO2: 20 mmol/L — ABNORMAL LOW (ref 22–32)
Calcium: 8 mg/dL — ABNORMAL LOW (ref 8.9–10.3)
Chloride: 110 mmol/L (ref 98–111)
Creatinine, Ser: 0.88 mg/dL (ref 0.61–1.24)
GFR, Estimated: >60 mL/min (ref >60)
Glucose, Bld: 113 mg/dL — ABNORMAL HIGH (ref 70–99)
Potassium: 3.8 mmol/L (ref 3.5–5.1)
Sodium: 139 mmol/L (ref 135–145)

## 2021-10-08 LAB — CBC
HCT: 32.9 % — ABNORMAL LOW (ref 39.0–52.0)
Hemoglobin: 11 g/dL — ABNORMAL LOW (ref 13.0–17.0)
MCH: 30.7 pg (ref 26.0–34.0)
MCHC: 33.4 g/dL (ref 30.0–36.0)
MCV: 91.9 fL (ref 80.0–100.0)
Platelets: 288 10*3/uL (ref 150–400)
RBC: 3.58 MIL/uL — ABNORMAL LOW (ref 4.22–5.81)
RDW: 13.7 % (ref 11.5–15.5)
WBC: 12.6 10*3/uL — ABNORMAL HIGH (ref 4.0–10.5)
nRBC: 0 % (ref 0.0–0.2)

## 2021-10-08 LAB — CULTURE, BLOOD (ROUTINE X 2)
Culture: NO GROWTH
Special Requests: ADEQUATE

## 2021-10-08 NOTE — Progress Notes (Signed)
Clarence Myers  NLZ:[id number] DOB: 1933/10/27 DOA: 09/29/2021 PCP: Mayra Neer, MD    Brief Narrative:  [phone] with a history of HTN, CAD, HLD, and glaucoma who presented to the ED with right-sided weakness and ataxia with some slurring of speech which was present upon waking in the morning.  MRI brain in the ED noted small acute pontine and corpus callosum infarcts.  After the patient was admitted he suffered the acute onset of worsening right upper extremity focal weakness.  Stat CT head was without acute change.  Consultants:  Neurology  Code Status: FULL CODE  Antimicrobials:  None  DVT prophylaxis: SCDs  Interim Hx: Afebrile.  Vital stable.  Saturation 96% room air.  No acute events reported overnight.  WBC continues to trend downward.  BUN continues to improve.  Appears stable on exam.  No new complaints.  Is slightly more alert today.  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 -worsened due to evolution of stroke during admission Felt to be due to small vessel disease - work-up completed by Neurology - dual antiplatelet therapy to continue for 3 weeks then Plavix alone  Slurred speech - dysphagia Waxing and waning during this admission with some intermittent dysphagia - diet as per SLP at this time - tolerating intake but does suffer occasional episodes of coughing - appears to have stabilized at this time  Aspiration pneumonia right lower lobe Initiated empiric antibiotic therapy 2/15 -follow-up CXR 2/17 confirmed stability -clinically improving -discontinue antibiotics after last dose today representing 5 full days of treatment with ongoing clinical improvement  1 of 2 blood cultures positive Staph epi Most consistent with a contaminant - monitor clinically w/o directed abx tx   Moderate AoS Noted on TTE this admit - murmer noted on exam - not hemodynamically significant clinically -discussed with family who report the patient's family physician  was aware of this and following  Hypokalemia Corrected with supplementation  HTN Slowly beginning to titrate blood pressure medications  HLD Continue medical therapy  History of CAD  Family Communication: Spoke with family at bedside at length Disposition: From home - SNF rehab stay - medically ready for d/c   Objective: Blood pressure (!) 157/73, pulse 63, temperature 98.5 F (36.9 C), resp. rate 18, height 5\' 3"  (1.6 m), weight 61.8 kg, SpO2 96 %.  Intake/Output Summary (Last 24 hours) at 10/08/2021 0757 Last data filed at 10/08/2021 0600 Gross per 24 hour  Intake 3961.91 ml  Output 1550 ml  Net 2411.91 ml    Filed Weights   09/29/21 1518  Weight: 61.8 kg    Examination: General: No acute respiratory distress -alert and conversant Lungs: Good air movement throughout all fields with no focal crackles or wheezing Cardiovascular: RRR w/ 2/6 holosystolic M which is stable Abdomen: NT/ND, soft, BS positive, no rebound Extremities: No edema BLE  CBC: Recent Labs  Lab 10/02/21 0233 10/04/21 0333 10/05/21 0242 10/06/21 0351 10/07/21 0436 10/08/21 0306  WBC 23.9* 19.3*   < > 20.4* 13.5* 12.6*  NEUTROABS 20.5* 15.8*  --   --   --   --   HGB 12.6* 12.7*   < > 10.3* 10.5* 11.0*  HCT 37.4* 36.3*   < > 30.7* 31.7* 32.9*  MCV 92.6 91.4   < > 92.5 92.2 91.9  PLT 265 235   < > 229 251 288   < > = values in this interval not displayed.    Basic Metabolic Panel: Recent Labs  Lab 10/06/21 0351 10/07/21 0436 10/08/21 0306  NA 140 140 139  K 3.2* 3.5 3.8  CL 113* 113* 110  CO2 19* 18* 20*  GLUCOSE 103* 109* 113*  BUN 34* 29* 20  CREATININE 1.06 0.97 0.88  CALCIUM 8.1* 7.9* 8.0*  MG 2.1 2.0  --     GFR: Estimated Creatinine Clearance: 49.4 mL/min (by C-G formula based on SCr of 0.88 mg/dL).  Liver Function Tests: Recent Labs  Lab 10/05/21 0242 10/06/21 0351  AST 13* 11*  ALT 9 9  ALKPHOS 81 77  BILITOT 1.6* 0.7  PROT 6.2* 5.6*  ALBUMIN 2.4* 2.0*     HbA1C: Hgb A1c MFr Bld  Date/Time Value Ref Range Status  09/29/2021 04:46 AM 5.8 (H) 4.8 - 5.6 % Final    Comment:    (NOTE) Pre diabetes:          5.7%-6.4%  Diabetes:              >6.4%  Glycemic control for   <7.0% adults with diabetes      Scheduled Meds:  aspirin EC  81 mg Oral Daily   chlorhexidine  15 mL Mouth Rinse BID   clopidogrel  75 mg Oral Daily   cycloSPORINE  1 drop Both Eyes BID   latanoprost  1 drop Both Eyes QHS   mouth rinse  15 mL Mouth Rinse q12n4p   metoprolol tartrate  12.5 mg Oral BID   multivitamin  1 tablet Oral Daily   omega-3 acid ethyl esters  1 g Oral Daily   pantoprazole  40 mg Oral Daily   simvastatin  40 mg Oral Daily   Continuous Infusions:  0.9 % NaCl with KCl 40 mEq / L 85 mL/hr at 10/07/21 1953   ampicillin-sulbactam (UNASYN) IV 3 g (10/08/21 0217)     LOS: 9 days   Cherene Altes, MD [facility] Office  [phone] Pager - Text Page per Shea Evans  If 7PM-7AM, please contact night-coverage per Amion 10/08/2021, 7:57 AM

## 2021-10-09 DIAGNOSIS — R1310 Dysphagia, unspecified: Secondary | ICD-10-CM

## 2021-10-09 LAB — CBC
HCT: 32.7 % — ABNORMAL LOW (ref 39.0–52.0)
Hemoglobin: 11.4 g/dL — ABNORMAL LOW (ref 13.0–17.0)
MCH: 31.3 pg (ref 26.0–34.0)
MCHC: 34.9 g/dL (ref 30.0–36.0)
MCV: 89.8 fL (ref 80.0–100.0)
Platelets: 318 10*3/uL (ref 150–400)
RBC: 3.64 MIL/uL — ABNORMAL LOW (ref 4.22–5.81)
RDW: 13.3 % (ref 11.5–15.5)
WBC: 15.7 10*3/uL — ABNORMAL HIGH (ref 4.0–10.5)
nRBC: 0 % (ref 0.0–0.2)

## 2021-10-09 LAB — BASIC METABOLIC PANEL
Anion gap: 10 (ref 5–15)
BUN: 18 mg/dL (ref 8–23)
CO2: 21 mmol/L — ABNORMAL LOW (ref 22–32)
Calcium: 8 mg/dL — ABNORMAL LOW (ref 8.9–10.3)
Chloride: 106 mmol/L (ref 98–111)
Creatinine, Ser: 0.85 mg/dL (ref 0.61–1.24)
GFR, Estimated: >60 mL/min (ref >60)
Glucose, Bld: 108 mg/dL — ABNORMAL HIGH (ref 70–99)
Potassium: 3.8 mmol/L (ref 3.5–5.1)
Sodium: 137 mmol/L (ref 135–145)

## 2021-10-09 MED ORDER — ASPIRIN 81 MG PO TBEC: 81.0000 mg | DELAYED_RELEASE_TABLET | Freq: Every day | ORAL | 0 refills | Status: DC

## 2021-10-09 MED ORDER — ACETAMINOPHEN 325 MG PO TABS: 650.0000 mg | ORAL_TABLET | ORAL | Status: AC | PRN

## 2021-10-09 MED ORDER — AMOXICILLIN-POT CLAVULANATE 875-125 MG PO TABS
1.0000 | ORAL_TABLET | Freq: Two times a day (BID) | ORAL | Status: AC
Start: 2021-10-09 — End: 2021-10-11

## 2021-10-09 MED ORDER — AMOXICILLIN-POT CLAVULANATE 875-125 MG PO TABS
1.0000 | ORAL_TABLET | Freq: Two times a day (BID) | ORAL | Status: DC
  Administered 2021-10-09 – 2021-10-10 (×2): 1 via ORAL
  Filled 2021-10-09 (×2): qty 1

## 2021-10-09 MED ORDER — CLOPIDOGREL BISULFATE 75 MG PO TABS: 75.0000 mg | ORAL_TABLET | Freq: Every day | ORAL | Status: DC

## 2021-10-09 NOTE — TOC Progression Note (Addendum)
Transition of Care [facility]) - Progression Note    Patient Details  Name: HONG MORING MRN: [id number] Date of Birth: September 06, 1933  Transition of Care [facility]) CM/SW [place], [place] Phone Number: 10/09/2021, 11:25 AM  Clinical Narrative:   CSW spoke with Admissions at [facility] and they are able to offer a bed for patient. CSW contacted [employer] to initiate insurance authorization for SNF and PTAR transport at discharge. CSW to follow.  UPDATE 3:44 PM: CSW received call from [facility] [employer] that patient is approved. SNF Auth #[id number], [place] [id number]. CSW updated [facility]. CSW to follow.   Expected Discharge Plan: [place] Barriers to Discharge: Continued Medical Work up, Ship broker  Expected Discharge Plan and Services Expected Discharge Plan: [place]     Post Acute Care Choice: IP Rehab Living arrangements for the past 2 months: Single Family Home                                       Social Determinants of Health (SDOH) Interventions    Readmission Risk Interventions No flowsheet data found.

## 2021-10-09 NOTE — Discharge Summary (Signed)
DISCHARGE SUMMARY  Clarence Myers  MR#: [id number]  DOB:Mar 09, 1934  Date of Admission: 09/29/2021 Date of Discharge: 10/09/2021  Attending Physician:Clarence Shackleford Hennie Duos, MD  Patient's ERD:EYCX, [name], MD  Consults: Neurology   Disposition: D/C to SNF for rehab stay   Follow-up Appts:  Follow-up Information     Attending of Record at your SNF Follow up.   Why: The attending of record at your chosen SNF will provide your ongoing medical care while you are undergoing residential rehab.                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 MRI brain in the ED noted small acute pontine and corpus callosum infarcts.   After the patient was admitted he suffered the acute worsening right upper extremity focal weakness.  Stat CT head was without acute change.   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 - worsened due to evolution of stroke during admission Felt to be due to small vessel disease - work-up completed by Neurology - dual antiplatelet therapy to continue for 3 weeks total then Plavix alone   Slurred speech - dysphagia Waxing and waning during this admission with some intermittent dysphagia - diet D3 w/ nectar liquid as per SLP at this time - tolerating intake but does suffer occasional episodes of coughing - appears to have stabilized at this time -  advance diet as able w/ ongoing SLP eval/tx at SNF    Aspiration pneumonia right lower lobe Initiated empiric antibiotic therapy 2/15 -follow-up CXR 2/17 confirmed stability -clinically improved - completed 5 days of IV abx tx - to complete 2 additional days of oral tx for a total of 7 days of tx    1 of 2 blood cultures positive Staph epi Most consistent with a contaminant - monitored clinically w/o directed abx tx    Moderate AoS Noted on TTE this admit - murmer noted on exam - not hemodynamically significant clinically -discussed with family who report the patient's family physician was aware of this and following   Hypokalemia Corrected with supplementation   HTN BP reasonably controlled at time of d/c    HLD Continue medical therapy   History of CAD    Allergies as of 10/09/2021       Reactions   Blue Crab (cavinectes Sapidus) Allergy Skin Test    "Hives"    Other    Bee Stings        Medication List     STOP taking these medications    acetaZOLAMIDE ER 500 MG capsule Commonly known as: DIAMOX   aspirin 81 MG tablet Replaced by: aspirin 81 MG EC tablet   predniSONE 5 MG tablet Commonly known as: DELTASONE       TAKE these medications    acetaminophen 325 MG tablet Commonly known as: TYLENOL Take 2 tablets (650 mg total) by mouth every 4 (four) hours as needed  for mild pain (or temp > 37.5 C (99.5 F)).   amoxicillin-clavulanate 875-125 MG tablet Commonly known as: AUGMENTIN Take 1 tablet by mouth every 12 (twelve) hours for 2 days.   aspirin 81 MG EC tablet Take 1 tablet (81 mg total) by mouth daily for 21 days. Swallow whole. Replaces: aspirin 81 MG tablet   clopidogrel 75 MG tablet Commonly known as: PLAVIX Take 1 tablet (75 mg total) by mouth daily. Start taking on: October 10, 2021   FISH OIL PO Take by mouth daily.   Lumigan 0.01 % Soln Generic drug: bimatoprost Place 1 drop into both eyes at bedtime.   metoprolol tartrate 25 MG  tablet Commonly known as: LOPRESSOR Take 12.5 mg by mouth 2 (two) times daily.   MULTI-VITAMIN PO Take by mouth daily.   multivitamin-lutein Caps capsule Take 1 capsule by mouth daily.   omeprazole 20 MG capsule Commonly known as: PRILOSEC Take 20 mg by mouth daily.   Restasis 0.05 % ophthalmic emulsion Generic drug: cycloSPORINE Place 1 drop into both eyes 2 (two) times daily.   simvastatin 40 MG tablet Commonly known as: ZOCOR Take 40 mg by mouth daily.        Day of Discharge BP (!) 153/85 (BP Location: Right Arm)    Pulse 61    Temp 97.8 F (36.6 C) (Oral)    Resp 18    Ht 5\' 3"  (1.6 m)    Wt 61.8 kg    SpO2 94%    BMI 24.13 kg/m   Physical Exam: General: No acute respiratory distress Lungs: Mild bibasilar crackles Cardiovascular: Regular rate and rhythm without murmur gallop or rub normal S1 and S2 Abdomen: Nontender, nondistended, soft, bowel sounds positive, no rebound, no ascites, no appreciable mass Extremities: No significant cyanosis, clubbing, or edema bilateral lower extremities  Basic Metabolic Panel: Recent Labs  Lab 10/05/21 0242 10/06/21 0351 10/07/21 0436 10/08/21 0306 10/09/21 0221  NA 137 140 140 139 137  K 3.5 3.2* 3.5 3.8 3.8  CL 109 113* 113* 110 106  CO2 15* 19* 18* 20* 21*  GLUCOSE 137* 103* 109* 113* 108*  BUN 28* 34* 29* 20 18  CREATININE 1.17 1.06 0.97 0.88 0.85  CALCIUM 8.3* 8.1* 7.9* 8.0* 8.0*  MG  --  2.1 2.0  --   --     Liver Function Tests: Recent Labs  Lab 10/05/21 0242 10/06/21 0351  AST 13* 11*  ALT 9 9  ALKPHOS 81 77  BILITOT 1.6* 0.7  PROT 6.2* 5.6*  ALBUMIN 2.4* 2.0*    CBC: Recent Labs  Lab 10/04/21 0333 10/05/21 0242 10/06/21 0351 10/07/21 0436 10/08/21 0306 10/09/21 0221  WBC 19.3* 24.5* 20.4* 13.5* 12.6* 15.7*  NEUTROABS 15.8*  --   --   --   --   --   HGB 12.7* 11.3* 10.3* 10.5* 11.0* 11.4*  HCT 36.3* 34.2* 30.7* 31.7* 32.9* 32.7*  MCV 91.4 93.2 92.5 92.2 91.9 89.8  PLT 235 228 229 251 288  318    Time spent in discharge (includes decision making & examination of pt): 35 minutes  10/09/2021, 3:44 PM   Cherene Altes, MD [facility] Office  [phone]

## 2021-10-09 NOTE — Progress Notes (Signed)
Physical Therapy Treatment Patient Details Name: Clarence Myers MRN: [id number] DOB: Sep 12, 1933 Today's Date: 10/09/2021   History of Present Illness Patient is a 88 y/o male who presents on 09/29/21 with slurred speech, weakness and difficulty walking; s/p 2 falls at home. 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 Found to have hypokalemia as well. PMH includes CAD, low back strain.    PT Comments    Pt seen with OT. Focused on EOB balance via tactile cues at pelvis and trunk while working dynamically with OT. Pt very rigid and stiff requiring with retropulsion when attempting to stand. Pt able to achieve EOB balance to min guard for about 1 min with max verbal cues. Acute PT to continue to follow.    Recommendations for follow up therapy are one component of a multi-disciplinary discharge planning process, led by the attending physician.  Recommendations may be updated based on patient status, additional functional criteria and insurance authorization.  Follow Up Recommendations  Acute inpatient rehab (3hours/day)     Assistance Recommended at Discharge Frequent or constant Supervision/Assistance  Patient can return home with the following Assistance with cooking/housework;Assist for transportation;Help with stairs or ramp for entrance;Two people to help with walking and/or transfers;A lot of help with walking and/or transfers;A lot of help with bathing/dressing/bathroom   Equipment Recommendations  Other (comment) (TBA)    Recommendations for Other Services Rehab consult     Precautions / Restrictions Precautions Precautions: Fall Precaution Comments: progressive worsening of R sided weakness Restrictions Weight Bearing Restrictions: No     Mobility  Bed Mobility Overal bed mobility: Needs Assistance Bed Mobility: Supine to Sit     Supine to sit: Mod assist, +2 for physical assistance     General bed mobility comments: required assistance for RLE and trunk.   Assistance needed to scoot hips on EOB, step by step verbal directional cues given, increased time required    Transfers Overall transfer level: Needs assistance Equipment used: 2 person hand held assist, Ambulation equipment used (face to face transfer with gait belt) Transfers: Sit to/from Stand, Bed to chair/wheelchair/BSC Sit to Stand: Max assist, +2 physical assistance Stand pivot transfers: Max assist, +2 physical assistance         General transfer comment: pt face to face with maxA for std pvt to chair, despite tactile cues at posterior hips and max verbal cues pt unable to achieve full upright posture, pt unable to step today resuloting in std pvt transfer to chair. attempted another stand in stedy to perform pericare, pt with improved upright posture however unable to maintain >5 sec, max tactile and verbal cues given to attempt to achieve full upright posture, Transfer via Lift Equipment: Stedy  Ambulation/Gait               General Gait Details: pt unable   Stairs             Wheelchair Mobility    Modified Rankin (Stroke Patients Only) Modified Rankin (Stroke Patients Only) Pre-Morbid Rankin Score: Moderate disability Modified Rankin: Severe disability     Balance Overall balance assessment: Needs assistance, History of Falls Sitting-balance support: Feet supported, Single extremity supported Sitting balance-Leahy Scale: Poor Sitting balance - Comments: patient was able to maintain static sitting on EOB following reaching and stretching exercises. Min to mod assist for dynamic sitting balance with OT with tactile cues at hip flexors Postural control: Posterior lean, Right lateral lean Standing balance support: Bilateral upper extremity supported Standing balance-Leahy Scale: Poor Standing  balance comment: reliant on Stedy and 2 person assist`                            Cognition Arousal/Alertness: Awake/alert Behavior During Therapy:  WFL for tasks assessed/performed Overall Cognitive Status: Impaired/Different from baseline Area of Impairment: Attention, Memory, Following commands, Safety/judgement, Problem solving                   Current Attention Level: Selective Memory: Decreased short-term memory Following Commands: Follows one step commands with increased time Safety/Judgement: Decreased awareness of safety, Decreased awareness of deficits   Problem Solving: Slow processing, Difficulty sequencing, Requires verbal cues General Comments: pt emotional, crying on 2 occasions during session, pt able to follow simple commands but requires increased time when addressing R UE and LE        Exercises General Exercises - Lower Extremity Quad Sets: AROM, Right, 10 reps, Supine (progressively improved from rep 1 to rep 10)    General Comments General comments (skin integrity, edema, etc.): VSS, emotional, OT performed pericare due to some residual stool on pad and bottom      Pertinent Vitals/Pain Pain Assessment Pain Assessment: No/denies pain (however noted grimacing with cervical ROM to the R)    Home Living                          Prior Function            PT Goals (current goals can now be found in the care plan section) Acute Rehab PT Goals PT Goal Formulation: With patient Time For Goal Achievement: 10/19/21 Potential to Achieve Goals: Fair Progress towards PT goals: Progressing toward goals    Frequency    Min 4X/week      PT Plan Current plan remains appropriate    Co-evaluation PT/OT/SLP Co-Evaluation/Treatment: Yes Reason for Co-Treatment: For patient/therapist safety PT goals addressed during session: Mobility/safety with mobility        AM-PAC PT "6 Clicks" Mobility   Outcome Measure  Help needed turning from your back to your side while in a flat bed without using bedrails?: A Lot Help needed moving from lying on your back to sitting on the side of a flat  bed without using bedrails?: A Lot Help needed moving to and from a bed to a chair (including a wheelchair)?: Total Help needed standing up from a chair using your arms (e.g., wheelchair or bedside chair)?: Total Help needed to walk in hospital room?: Total Help needed climbing 3-5 steps with a railing? : Total 6 Click Score: 8    End of Session Equipment Utilized During Treatment: Gait belt Activity Tolerance: Patient tolerated treatment well Patient left: with call bell/phone within reach;in chair;with chair alarm set Nurse Communication: Mobility status (use of Stedy may benefit for transfers) PT Visit Diagnosis: History of falling (Z91.81);Other abnormalities of gait and mobility (R26.89);Unsteadiness on feet (R26.81)     Time: 4332-9518 PT Time Calculation (min) (ACUTE ONLY): 31 min  Charges:  $Neuromuscular Re-education: 8-22 mins                     Kittie Plater, PT, DPT Acute Rehabilitation Services Pager #: [phone] Office #: [phone]    Berline Lopes 10/09/2021, 10:58 AM

## 2021-10-09 NOTE — Progress Notes (Signed)
Occupational Therapy Treatment Patient Details Name: Clarence Myers MRN: [id number] DOB: 03/27/1934 Today's Date: 10/09/2021   History of present illness Patient is a 88 y/o male who presents on 09/29/21 with slurred speech, weakness and difficulty walking; s/p 2 falls at home. 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 Found to have hypokalemia as well. PMH includes CAD, low back strain.   OT comments  Patient received in supine and agreeable to PT/OT session. Patient was mod assist +2 to get to EOB with assistance for RLE and trunk. Patient performed reaching and stretching tasks to address posture and sitting balance. Patient was +2 hand held assist to transfer to recliner and stood in stedy x2 to clean following BM. Patient was tearful on 2 occasions during session. Acute OT to continue to follow.    Recommendations for follow up therapy are one component of a multi-disciplinary discharge planning process, led by the attending physician.  Recommendations may be updated based on patient status, additional functional criteria and insurance authorization.    Follow Up Recommendations  Acute inpatient rehab (3hours/day)    Assistance Recommended at Discharge Frequent or constant Supervision/Assistance  Patient can return home with the following  A lot of help with walking and/or transfers;A lot of help with bathing/dressing/bathroom;Direct supervision/assist for medications management;Help with stairs or ramp for entrance;Assist for transportation   Equipment Recommendations  Other (comment) (TBD)    Recommendations for Other Services      Precautions / Restrictions Precautions Precautions: Fall Precaution Comments: hx of 2 recent falls Restrictions Weight Bearing Restrictions: No       Mobility Bed Mobility Overal bed mobility: Needs Assistance Bed Mobility: Supine to Sit     Supine to sit: Mod assist, +2 for physical assistance     General bed mobility  comments: required assistance for RLE and trunk.  Assistance needed to scoot hips on EOB    Transfers Overall transfer level: Needs assistance Equipment used: 2 person hand held assist, Ambulation equipment used Transfers: Sit to/from Stand, Bed to chair/wheelchair/BSC Sit to Stand: Max assist, +2 physical assistance Stand pivot transfers: Max assist, +2 physical assistance         General transfer comment: 2 person hand held assist to transfer to recliner.  Patient stood x2 to clean bottom from relciner into stedy Transfer via Lift Equipment: [employer] Overall balance assessment: Needs assistance, History of Falls Sitting-balance support: Feet supported, Single extremity supported Sitting balance-Leahy Scale: Poor Sitting balance - Comments: patient was able to maintain static sitting on EOB following reaching and stretching exercises. Min to mod assist for dynamic sitting balance Postural control: Posterior lean, Right lateral lean Standing balance support: Bilateral upper extremity supported Standing balance-Leahy Scale: Poor Standing balance comment: reliant on Stedy and 2 person assist`                           ADL either performed or assessed with clinical judgement   ADL Overall ADL's : Needs assistance/impaired     Grooming: Wash/dry face;Brushing hair;Minimal assistance;Sitting Grooming Details (indicate cue type and reason): performed seated on EOB with assistance to comb hair     Lower Body Bathing: Maximal assistance;+2 for physical assistance;+2 for safety/equipment;Sit to/from stand Lower Body Bathing Details (indicate cue type and reason): stood in stedy to clean bottom                       General ADL Comments:  assisted with grooming seated on EOB and required max assist of 2 for cleaning bottom while standing in stedy    Extremity/Trunk Assessment Upper Extremity Assessment RUE Deficits / Details: minimal antigravity movements.  unable to use functionally. unaware of positioning RUE Sensation: WNL RUE Coordination: decreased fine motor;decreased gross motor LUE Deficits / Details: grossly 4/5, slow and deliberate coordination LUE Sensation: WNL LUE Coordination: decreased fine motor            Vision       Perception     Praxis      Cognition Arousal/Alertness: Awake/alert Behavior During Therapy: WFL for tasks assessed/performed Overall Cognitive Status: Impaired/Different from baseline Area of Impairment: Attention, Memory, Following commands, Safety/judgement, Problem solving                   Current Attention Level: Selective Memory: Decreased short-term memory Following Commands: Follows one step commands with increased time Safety/Judgement: Decreased awareness of safety, Decreased awareness of deficits   Problem Solving: Slow processing, Difficulty sequencing, Requires verbal cues General Comments: asked what city he was in and was tearful on occasion        Exercises      Shoulder Instructions       General Comments      Pertinent Vitals/ Pain       Pain Assessment Faces Pain Scale: No hurt Pain Intervention(s): Monitored during session  Home Living                                          Prior Functioning/Environment              Frequency  Min 2X/week        Progress Toward Goals  OT Goals(current goals can now be found in the care plan section)  Progress towards OT goals: Progressing toward goals  Acute Rehab OT Goals Patient Stated Goal: get better OT Goal Formulation: With patient Time For Goal Achievement: 10/17/21 Potential to Achieve Goals: Good ADL Goals Pt Will Perform Grooming: with modified independence;standing Pt Will Perform Upper Body Dressing: with modified independence;sitting Pt Will Perform Lower Body Dressing: with supervision;sit to/from stand Pt Will Transfer to Toilet: with supervision;ambulating;regular  height toilet  Plan Discharge plan remains appropriate    Co-evaluation    PT/OT/SLP Co-Evaluation/Treatment: Yes Reason for Co-Treatment: For patient/therapist safety;To address functional/ADL transfers          AM-PAC OT "6 Clicks" Daily Activity     Outcome Measure   Help from another person eating meals?: A Little Help from another person taking care of personal grooming?: A Little Help from another person toileting, which includes using toliet, bedpan, or urinal?: A Lot Help from another person bathing (including washing, rinsing, drying)?: A Lot Help from another person to put on and taking off regular upper body clothing?: A Lot Help from another person to put on and taking off regular lower body clothing?: A Lot 6 Click Score: 14    End of Session Equipment Utilized During Treatment: Gait belt;Other (comment) Charlaine Dalton)  OT Visit Diagnosis: Unsteadiness on feet (R26.81);Other abnormalities of gait and mobility (R26.89);Muscle weakness (generalized) (M62.81);History of falling (Z91.81)   Activity Tolerance Patient tolerated treatment well   Patient Left in chair;with call bell/phone within reach;with chair alarm set   Nurse Communication Mobility status;Need for lift equipment        Time: [phone]  OT Time Calculation (min): 29 min  Charges: OT General Charges $OT Visit: 1 Visit OT Treatments $Self Care/Home Management : 8-22 mins  Lodema Hong, [place]  Pager [phone] Office [place] 10/09/2021, 8:50 AM

## 2021-10-10 ENCOUNTER — Inpatient Hospital Stay (HOSPITAL_COMMUNITY): Payer: PPO

## 2021-10-10 DIAGNOSIS — K219 Gastro-esophageal reflux disease without esophagitis: Secondary | ICD-10-CM | POA: Diagnosis not present

## 2021-10-10 DIAGNOSIS — E785 Hyperlipidemia, unspecified: Secondary | ICD-10-CM | POA: Diagnosis not present

## 2021-10-10 DIAGNOSIS — I2510 Atherosclerotic heart disease of native coronary artery without angina pectoris: Secondary | ICD-10-CM | POA: Diagnosis not present

## 2021-10-10 DIAGNOSIS — I350 Nonrheumatic aortic (valve) stenosis: Secondary | ICD-10-CM | POA: Diagnosis not present

## 2021-10-10 DIAGNOSIS — R279 Unspecified lack of coordination: Secondary | ICD-10-CM | POA: Diagnosis not present

## 2021-10-10 DIAGNOSIS — R2681 Unsteadiness on feet: Secondary | ICD-10-CM | POA: Diagnosis not present

## 2021-10-10 DIAGNOSIS — T17320A Food in larynx causing asphyxiation, initial encounter: Secondary | ICD-10-CM | POA: Diagnosis not present

## 2021-10-10 DIAGNOSIS — I69391 Dysphagia following cerebral infarction: Secondary | ICD-10-CM | POA: Diagnosis not present

## 2021-10-10 DIAGNOSIS — M6281 Muscle weakness (generalized): Secondary | ICD-10-CM | POA: Diagnosis not present

## 2021-10-10 DIAGNOSIS — I69351 Hemiplegia and hemiparesis following cerebral infarction affecting right dominant side: Secondary | ICD-10-CM | POA: Diagnosis not present

## 2021-10-10 DIAGNOSIS — R1311 Dysphagia, oral phase: Secondary | ICD-10-CM | POA: Diagnosis not present

## 2021-10-10 DIAGNOSIS — R1312 Dysphagia, oropharyngeal phase: Secondary | ICD-10-CM | POA: Diagnosis not present

## 2021-10-10 DIAGNOSIS — Z7401 Bed confinement status: Secondary | ICD-10-CM | POA: Diagnosis not present

## 2021-10-10 DIAGNOSIS — I639 Cerebral infarction, unspecified: Secondary | ICD-10-CM | POA: Diagnosis not present

## 2021-10-10 DIAGNOSIS — G459 Transient cerebral ischemic attack, unspecified: Secondary | ICD-10-CM | POA: Diagnosis not present

## 2021-10-10 DIAGNOSIS — R270 Ataxia, unspecified: Secondary | ICD-10-CM | POA: Diagnosis not present

## 2021-10-10 DIAGNOSIS — R4781 Slurred speech: Secondary | ICD-10-CM | POA: Diagnosis not present

## 2021-10-10 DIAGNOSIS — Z79899 Other long term (current) drug therapy: Secondary | ICD-10-CM | POA: Diagnosis not present

## 2021-10-10 DIAGNOSIS — H409 Unspecified glaucoma: Secondary | ICD-10-CM | POA: Diagnosis not present

## 2021-10-10 DIAGNOSIS — E876 Hypokalemia: Secondary | ICD-10-CM | POA: Diagnosis not present

## 2021-10-10 DIAGNOSIS — Z7901 Long term (current) use of anticoagulants: Secondary | ICD-10-CM | POA: Diagnosis not present

## 2021-10-10 DIAGNOSIS — R1310 Dysphagia, unspecified: Secondary | ICD-10-CM | POA: Diagnosis not present

## 2021-10-10 DIAGNOSIS — J189 Pneumonia, unspecified organism: Secondary | ICD-10-CM | POA: Diagnosis not present

## 2021-10-10 DIAGNOSIS — R059 Cough, unspecified: Secondary | ICD-10-CM | POA: Diagnosis not present

## 2021-10-10 DIAGNOSIS — I10 Essential (primary) hypertension: Secondary | ICD-10-CM | POA: Diagnosis not present

## 2021-10-10 DIAGNOSIS — F32A Depression, unspecified: Secondary | ICD-10-CM | POA: Diagnosis not present

## 2021-10-10 DIAGNOSIS — G8101 Flaccid hemiplegia affecting right dominant side: Secondary | ICD-10-CM | POA: Diagnosis not present

## 2021-10-10 DIAGNOSIS — R531 Weakness: Secondary | ICD-10-CM | POA: Diagnosis not present

## 2021-10-10 DIAGNOSIS — T17908D Unspecified foreign body in respiratory tract, part unspecified causing other injury, subsequent encounter: Secondary | ICD-10-CM | POA: Diagnosis not present

## 2021-10-10 LAB — CBC
HCT: 35.7 % — ABNORMAL LOW (ref 39.0–52.0)
Hemoglobin: 11.9 g/dL — ABNORMAL LOW (ref 13.0–17.0)
MCH: 30.3 pg (ref 26.0–34.0)
MCHC: 33.3 g/dL (ref 30.0–36.0)
MCV: 90.8 fL (ref 80.0–100.0)
Platelets: 384 10*3/uL (ref 150–400)
RBC: 3.93 MIL/uL — ABNORMAL LOW (ref 4.22–5.81)
RDW: 13.2 % (ref 11.5–15.5)
WBC: 17.1 10*3/uL — ABNORMAL HIGH (ref 4.0–10.5)
nRBC: 0 % (ref 0.0–0.2)

## 2021-10-10 IMAGING — DX DG CHEST 1V PORT
1 series · 1 of 1 positions shown · non-contrast
Comparison: [DATE]

CLINICAL DATA: Pneumonia, cough

EXAM:
PORTABLE CHEST 1 VIEW

[chest]
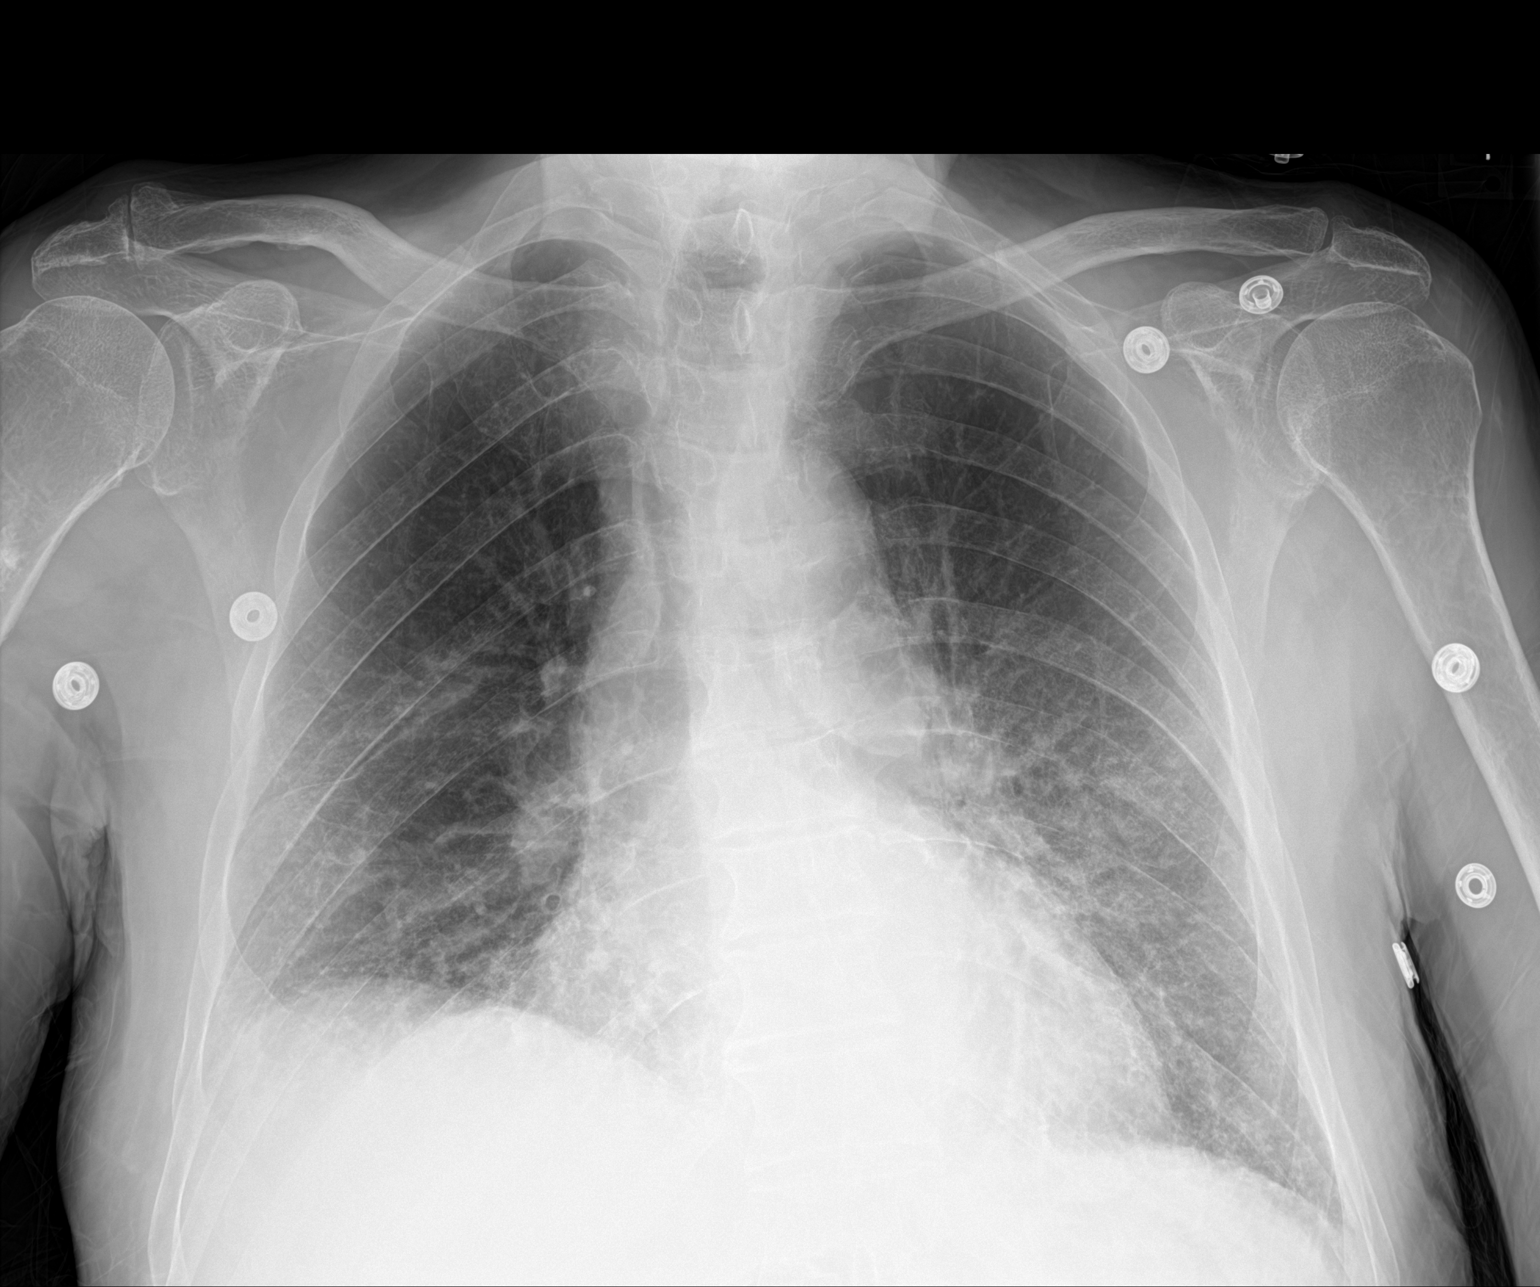

[1 of 1 positions shown; findings below may reference images not displayed]

FINDINGS: Mild elevation of the right diaphragm. Hazy bilateral lower lobe
airspace disease concerning for bibasilar pneumonia. No pleural
effusion or pneumothorax. Heart and mediastinal contours are
unremarkable.

No acute osseous abnormality.
IMPRESSION: 1. Hazy bilateral lower lobe airspace disease concerning for
bibasilar pneumonia.

## 2021-10-10 NOTE — Discharge Summary (Signed)
DISCHARGE SUMMARY  Clarence Myers  MR#: [id number]  DOB:[birth year]  Date of Admission: 09/29/2021 Date of Discharge: 10/10/2021  Attending Physician:Clarence Avilla Hennie Duos, MD  Patient's AUQ:JFHL, Clarence May, MD  Consults: Neurology   Disposition: D/C to SNF for rehab stay   Follow-up Appts:  Follow-up Information     Attending of Record at your SNF Follow up.   Why: The attending of record at your chosen SNF will provide your ongoing medical care while you are undergoing residential rehab.                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 MRI brain in the ED noted small acute pontine and corpus callosum infarcts.   After the patient was admitted he suffered the acute worsening right upper extremity focal weakness.  Stat CT head was without acute change.   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 - worsened due to evolution of stroke during admission Felt to be due to small vessel disease - work-up completed by Neurology - dual antiplatelet therapy to continue for 3 weeks total then Plavix alone   Slurred speech - dysphagia Waxing and waning during this admission with some intermittent dysphagia - diet D3 w/ nectar liquid as per SLP at this time - tolerating intake but does suffer occasional  episodes of coughing - appears to have stabilized at this time - advance diet as able w/ ongoing SLP eval/tx at SNF    Aspiration pneumonia right lower lobe and left lower lobe  Initiated empiric antibiotic therapy 2/15 -follow-up CXR 2/17 and again 2/21 confirmed stability -clinically improved - completed 5 days of IV abx tx - to complete 2 additional days of oral tx for a total of 7 days of tx    1 of 2 blood cultures positive Staph epi Most consistent with a contaminant - monitored clinically w/o directed abx tx    Moderate AoS Noted on TTE this admit - murmer noted on exam - not hemodynamically significant clinically -discussed with family who report the patient's family physician was aware of this and following  Elevated WBC WBC waxed and waned during hospital stay, but never fully normalized - initially thought to simpy be related to his aspiration PNA, but even w/ tx and clinical resolution of his sx his WBC remains elevated - monitor clinically in outpt setting for other sx of infection - recheck WBC in 3-5 days    Hypokalemia Corrected with supplementation   HTN BP reasonably controlled at time of d/c    HLD Continue medical therapy   History of CAD    Allergies as of 10/10/2021       Reactions   Blue Crab (cavinectes Sapidus) Allergy Skin Test    "Hives"    Other    Bee Stings        Medication List  STOP taking these medications    acetaZOLAMIDE ER 500 MG capsule Commonly known as: DIAMOX   aspirin 81 MG tablet Replaced by: aspirin 81 MG EC tablet   predniSONE 5 MG tablet Commonly known as: DELTASONE       TAKE these medications    acetaminophen 325 MG tablet Commonly known as: TYLENOL Take 2 tablets (650 mg total) by mouth every 4 (four) hours as needed for mild pain (or temp > 37.5 C (99.5 F)).   amoxicillin-clavulanate 875-125 MG tablet Commonly known as: AUGMENTIN Take 1 tablet by mouth every 12 (twelve) hours for 2 days.   aspirin 81  MG EC tablet Take 1 tablet (81 mg total) by mouth daily for 21 days. Swallow whole. Replaces: aspirin 81 MG tablet   clopidogrel 75 MG tablet Commonly known as: PLAVIX Take 1 tablet (75 mg total) by mouth daily.   FISH OIL PO Take by mouth daily.   Lumigan 0.01 % Soln Generic drug: bimatoprost Place 1 drop into both eyes at bedtime.   metoprolol tartrate 25 MG tablet Commonly known as: LOPRESSOR Take 12.5 mg by mouth 2 (two) times daily.   MULTI-VITAMIN PO Take by mouth daily.   multivitamin-lutein Caps capsule Take 1 capsule by mouth daily.   omeprazole 20 MG capsule Commonly known as: PRILOSEC Take 20 mg by mouth daily.   Restasis 0.05 % ophthalmic emulsion Generic drug: cycloSPORINE Place 1 drop into both eyes 2 (two) times daily.   simvastatin 40 MG tablet Commonly known as: ZOCOR Take 40 mg by mouth daily.        Day of Discharge BP 127/69 (BP Location: Right Arm)    Pulse 61    Temp 98.6 F (37 C) (Oral)    Resp 20    Ht 5\' 3"  (1.6 m)    Wt 61.8 kg    SpO2 96%    BMI 24.13 kg/m   Physical Exam: General: No acute respiratory distress Lungs: Mild bibasilar crackles w/o wheezing  Cardiovascular: Regular rate and rhythm without murmur gallop or rub normal S1 and S2 Abdomen: Nontender, nondistended, soft, bowel sounds positive, no rebound, no ascites, no appreciable mass Extremities: No significant cyanosis, clubbing, or edema bilateral lower extremities  Basic Metabolic Panel: Recent Labs  Lab 10/05/21 0242 10/06/21 0351 10/07/21 0436 10/08/21 0306 10/09/21 0221  NA 137 140 140 139 137  K 3.5 3.2* 3.5 3.8 3.8  CL 109 113* 113* 110 106  CO2 15* 19* 18* 20* 21*  GLUCOSE 137* 103* 109* 113* 108*  BUN 28* 34* 29* 20 18  CREATININE 1.17 1.06 0.97 0.88 0.85  CALCIUM 8.3* 8.1* 7.9* 8.0* 8.0*  MG  --  2.1 2.0  --   --      Liver Function Tests: Recent Labs  Lab 10/05/21 0242 10/06/21 0351  AST 13* 11*  ALT 9 9  ALKPHOS 81 77  BILITOT 1.6* 0.7   PROT 6.2* 5.6*  ALBUMIN 2.4* 2.0*     CBC: Recent Labs  Lab 10/04/21 0333 10/05/21 0242 10/06/21 0351 10/07/21 0436 10/08/21 0306 10/09/21 0221 10/10/21 0135  WBC 19.3*   < > 20.4* 13.5* 12.6* 15.7* 17.1*  NEUTROABS 15.8*  --   --   --   --   --   --   HGB 12.7*   < > 10.3* 10.5* 11.0* 11.4* 11.9*  HCT 36.3*   < > 30.7* 31.7* 32.9* 32.7* 35.7*  MCV 91.4   < > 92.5 92.2 91.9 89.8  90.8  PLT 235   < > 229 251 288 318 384   < > = values in this interval not displayed.     Time spent in discharge (includes decision making & examination of pt): 35 minutes  10/10/2021, 8:36 AM   Cherene Altes, MD [facility] Office  [phone]

## 2021-10-10 NOTE — Discharge Summary (Incomplete)
Pt admitted for CVA. States he has reached his goals for this admission and is excited to be discharged to rehabilitation center. Pt's daughter is at his side and has all personal belongings, discharge packet, instructions and follow-up appointments.

## 2021-10-10 NOTE — Progress Notes (Signed)
Pt wheeled off unit by [facility]. Daughter is transporting all of pts belongings. IV removed.

## 2021-10-10 NOTE — Care Management Important Message (Signed)
Important Message  Patient Details  Name: Clarence Myers MRN: [id number] Date of Birth: 03-14-1934   Medicare Important Message Given:  Yes     Orbie Pyo 10/10/2021, 2:41 PM

## 2021-10-10 NOTE — TOC Transition Note (Signed)
Transition of Care [facility]) - CM/SW Discharge Note   Patient Details  Name: Clarence Myers MRN: [id number] Date of Birth: Jan 03, 1934  [facility] CM/SW Contact:  Geralynn Ochs, LCSW Phone Number: 10/10/2021, 11:36 AM   Clinical Narrative:   CSW confirmed with [facility] bed availability for today, sent discharge summary and orders. CSW notified daughter at bedside, scheduled transport with [facility] for next available pick up.  Nurse to call report to [phone].    Final next level of care: Skilled Nursing Facility Barriers to Discharge: Barriers Resolved   Patient Goals and CMS Choice Patient states their goals for this hospitalization and ongoing recovery are:: to get rehab CMS Medicare.gov Compare Post Acute Care list provided to:: Patient Represenative (must comment) Choice offered to / list presented to : Adult Children, Spouse  Discharge Placement              Patient chooses bed at: [facility] [place] Patient to be transferred to facility by: [place] Name of family member notified: [name] Patient and family notified of of transfer: 10/10/21  Discharge Plan and Services     Post Acute Care Choice: IP Rehab                               Social Determinants of Health (SDOH) Interventions     Readmission Risk Interventions No flowsheet data found.

## 2021-10-11 DIAGNOSIS — E785 Hyperlipidemia, unspecified: Secondary | ICD-10-CM | POA: Diagnosis not present

## 2021-10-11 DIAGNOSIS — I10 Essential (primary) hypertension: Secondary | ICD-10-CM | POA: Diagnosis not present

## 2021-10-11 DIAGNOSIS — I350 Nonrheumatic aortic (valve) stenosis: Secondary | ICD-10-CM | POA: Diagnosis not present

## 2021-10-11 DIAGNOSIS — I639 Cerebral infarction, unspecified: Secondary | ICD-10-CM | POA: Diagnosis not present

## 2021-10-12 DIAGNOSIS — J189 Pneumonia, unspecified organism: Secondary | ICD-10-CM | POA: Diagnosis not present

## 2021-10-12 DIAGNOSIS — I69351 Hemiplegia and hemiparesis following cerebral infarction affecting right dominant side: Secondary | ICD-10-CM | POA: Diagnosis not present

## 2021-10-12 DIAGNOSIS — K219 Gastro-esophageal reflux disease without esophagitis: Secondary | ICD-10-CM | POA: Diagnosis not present

## 2021-10-12 DIAGNOSIS — I10 Essential (primary) hypertension: Secondary | ICD-10-CM | POA: Diagnosis not present

## 2021-10-12 DIAGNOSIS — E785 Hyperlipidemia, unspecified: Secondary | ICD-10-CM | POA: Diagnosis not present

## 2021-10-25 ENCOUNTER — Telehealth (HOSPITAL_COMMUNITY): Payer: Self-pay

## 2021-10-25 NOTE — Telephone Encounter (Signed)
Attempted to contact Claiborne Billings Nature conservation officer) at [facility] to schedule patient for OP MBS - left voicemail. ?

## 2021-10-26 ENCOUNTER — Other Ambulatory Visit (HOSPITAL_COMMUNITY): Payer: Self-pay | Admitting: *Deleted

## 2021-10-26 DIAGNOSIS — F32A Depression, unspecified: Secondary | ICD-10-CM | POA: Diagnosis not present

## 2021-10-26 DIAGNOSIS — I69351 Hemiplegia and hemiparesis following cerebral infarction affecting right dominant side: Secondary | ICD-10-CM | POA: Diagnosis not present

## 2021-10-26 DIAGNOSIS — R1310 Dysphagia, unspecified: Secondary | ICD-10-CM

## 2021-10-27 ENCOUNTER — Other Ambulatory Visit: Payer: Self-pay

## 2021-10-27 ENCOUNTER — Ambulatory Visit (HOSPITAL_COMMUNITY)
Admission: RE | Admit: 2021-10-27 | Discharge: 2021-10-27 | Disposition: A | Discharge status: Home / Self Care | Payer: PPO | Source: Ambulatory Visit | Attending: Internal Medicine | Admitting: Internal Medicine

## 2021-10-27 DIAGNOSIS — J189 Pneumonia, unspecified organism: Secondary | ICD-10-CM | POA: Insufficient documentation

## 2021-10-27 DIAGNOSIS — R1310 Dysphagia, unspecified: Secondary | ICD-10-CM | POA: Insufficient documentation

## 2021-10-27 DIAGNOSIS — T17320A Food in larynx causing asphyxiation, initial encounter: Secondary | ICD-10-CM | POA: Diagnosis not present

## 2021-10-27 IMAGING — RF DG SWALLOWING FUNCTION
12 of 21 series · 12 of 24 positions shown · non-contrast
Comparison: NONE.

CLINICAL DATA: Dysphagia.

EXAM:
MODIFIED BARIUM SWALLOW
TECHNIQUE: Different consistencies of barium were administered orally to the
patient by the Speech Pathologist. Imaging of the pharynx was
performed in the lateral projection. HALLBERG, PA was present
in the fluoroscopy room during this study, which was supervised and
interpreted by myself.
FLUOROSCOPY:
Radiation Exposure Index (as provided by the fluoroscopic device):
31.78 mGy Kerma

[Series 2: run · 1 of 44 frames shown (1 of 12)]
[frame 1/44]
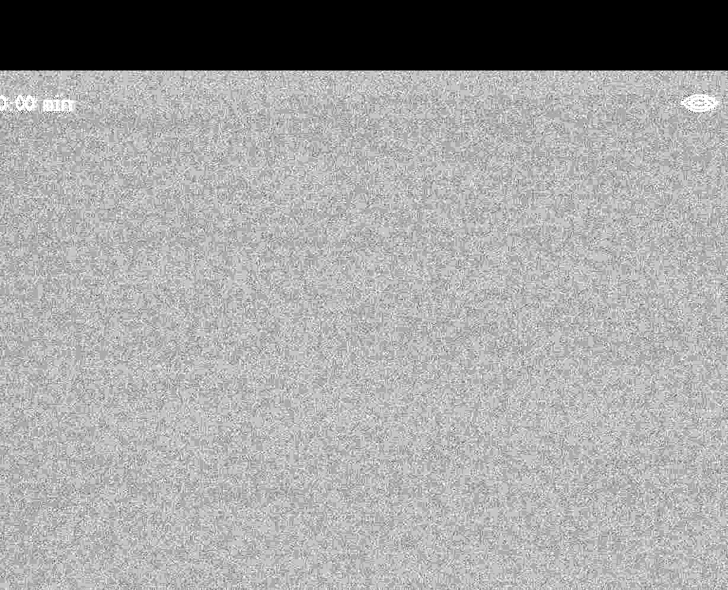

[Series 3: run · 1 of 31 frames shown (2 of 12)]
[frame 27/31]
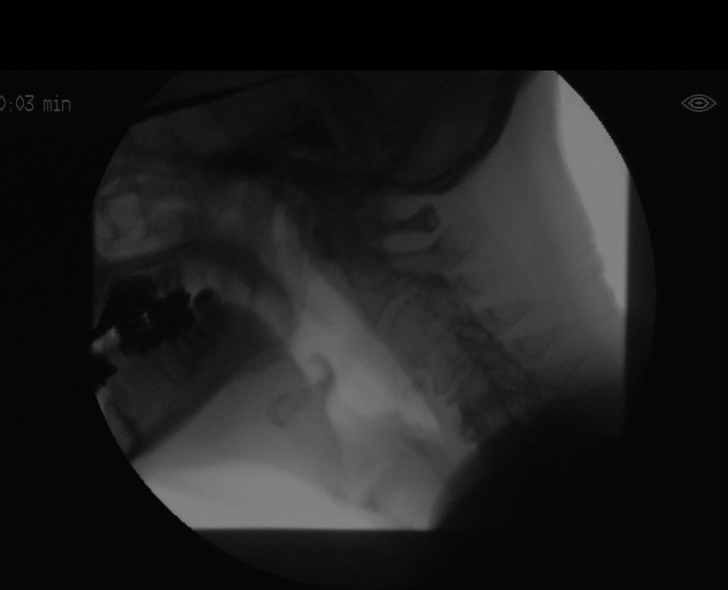

[Series 5: run · 1 of 160 frames shown (3 of 12)]
[frame 81/160]
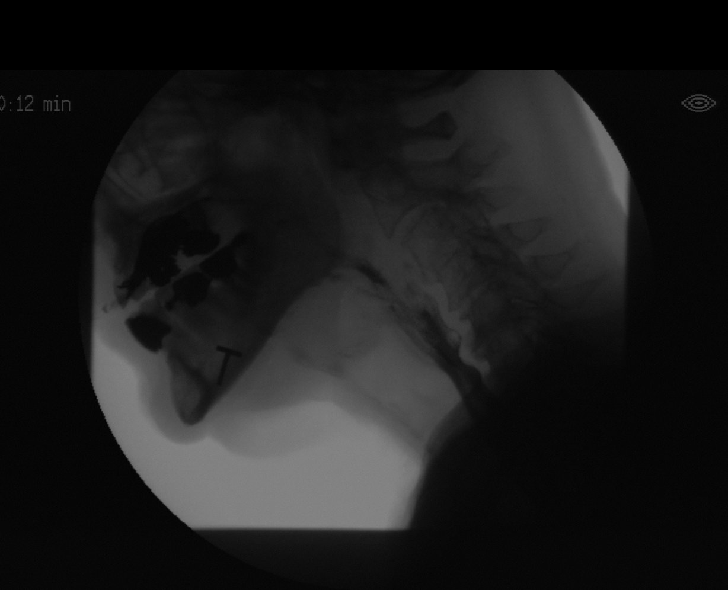

[Series 7: run · 1 of 135 frames shown (4 of 12)]
[frame 21/135]
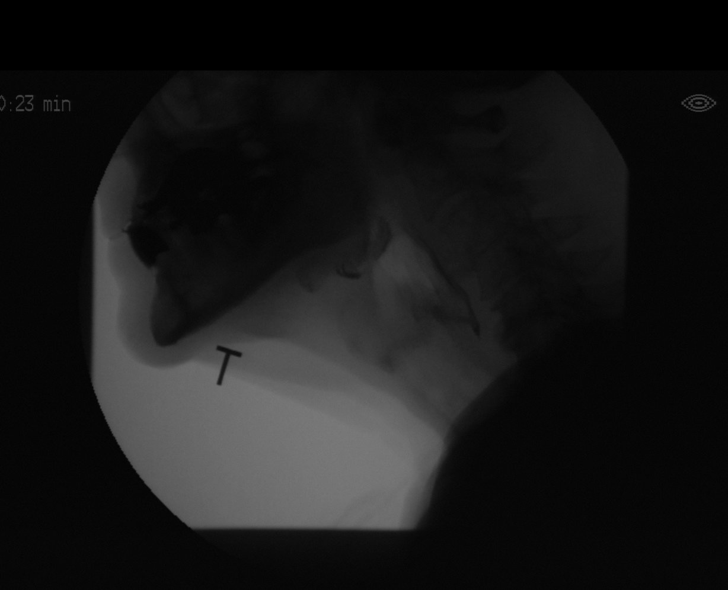

[Series 9: run · 1 of 104 frames shown (5 of 12)]
[frame 1/104]
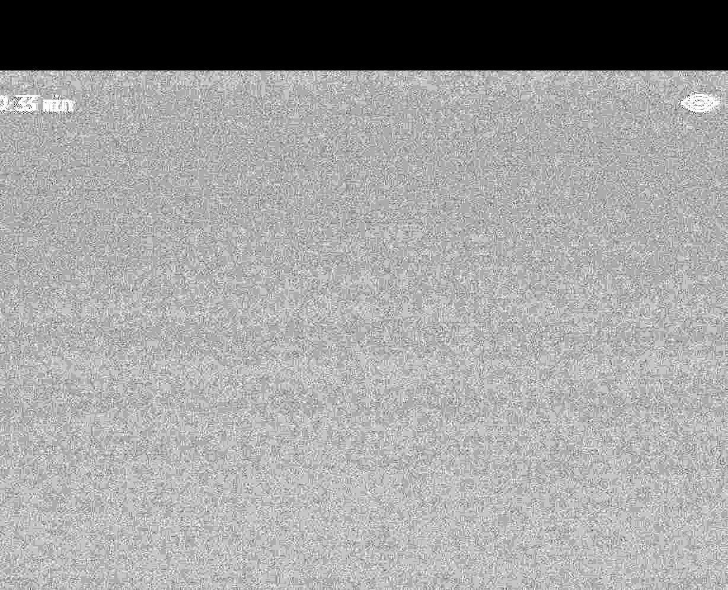

[Series 11: run · 1 of 176 frames shown (6 of 12)]
[frame 27/176]
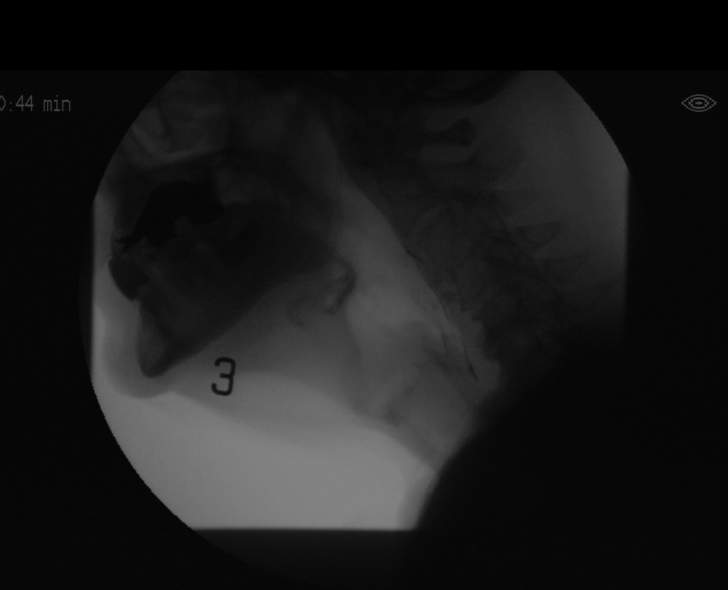

[Series 12: run · 1 of 298 frames shown (7 of 12)]
[frame 254/298]
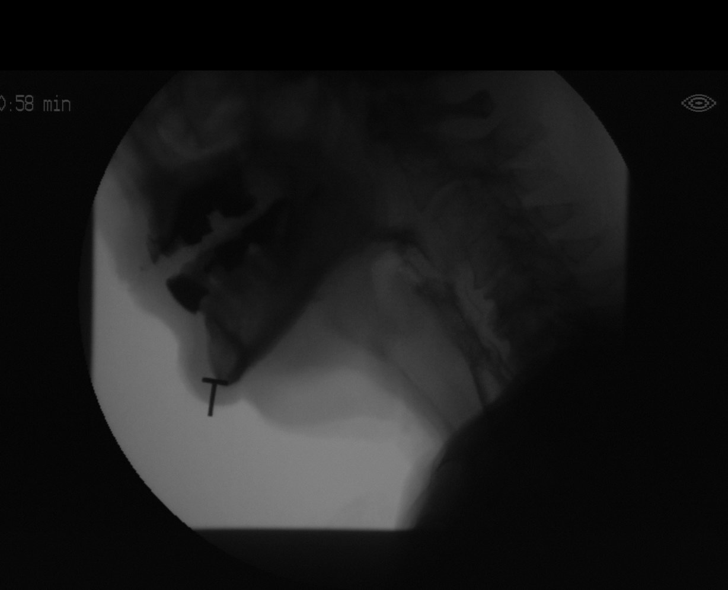

[Series 14: run · 1 of 108 frames shown (8 of 12)]
[frame 55/108]
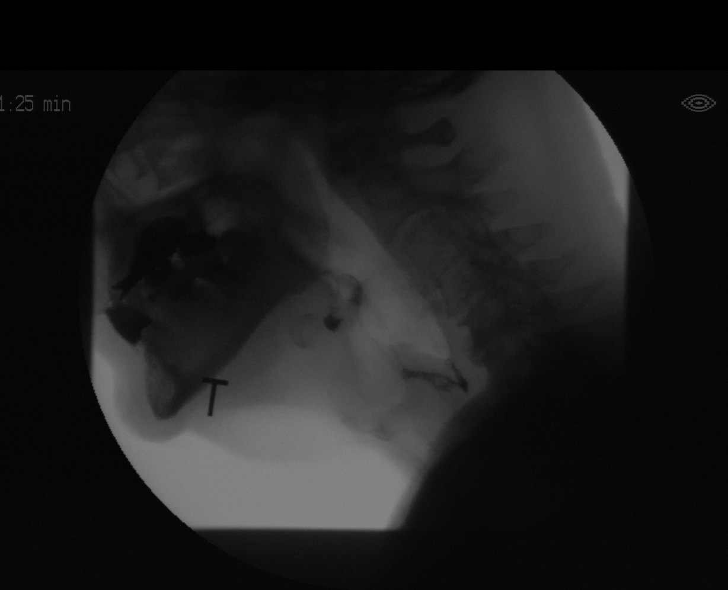

[Series 16: run · 1 of 138 frames shown (9 of 12)]
[frame 69/138]
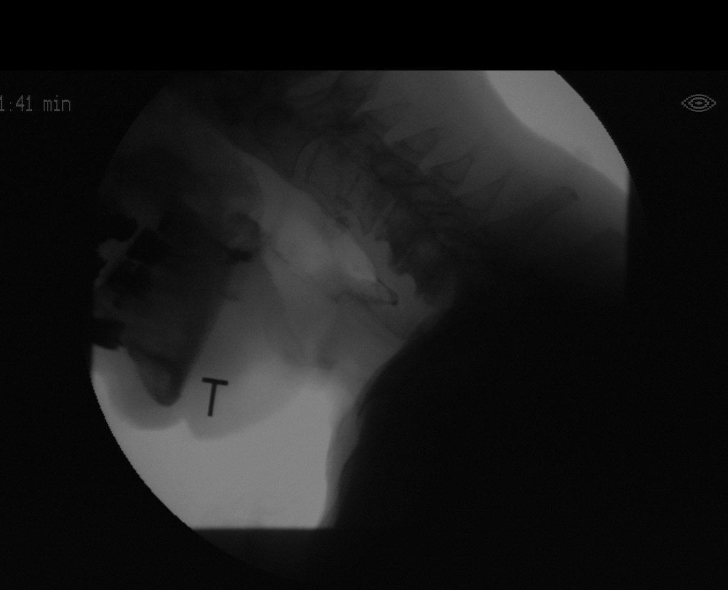

[Series 18: run · 1 of 152 frames shown (10 of 12)]
[frame 41/152]
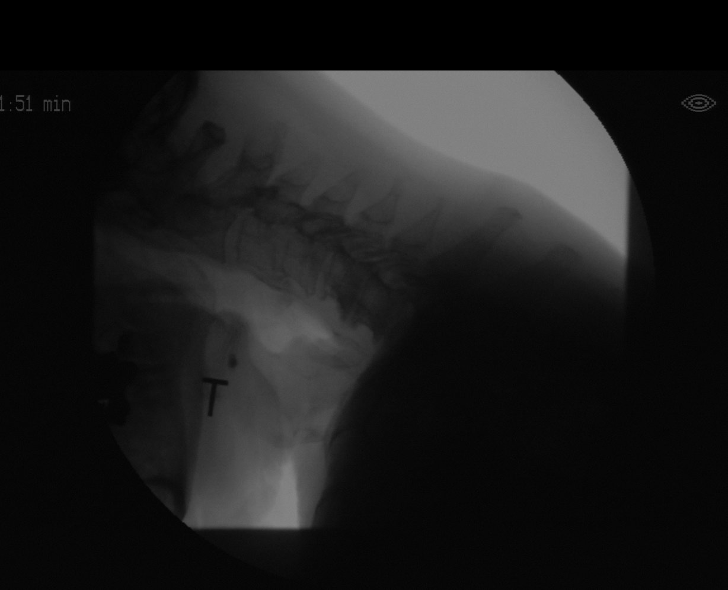

[Series 20: run · 1 of 274 frames shown (11 of 12)]
[frame 42/274]
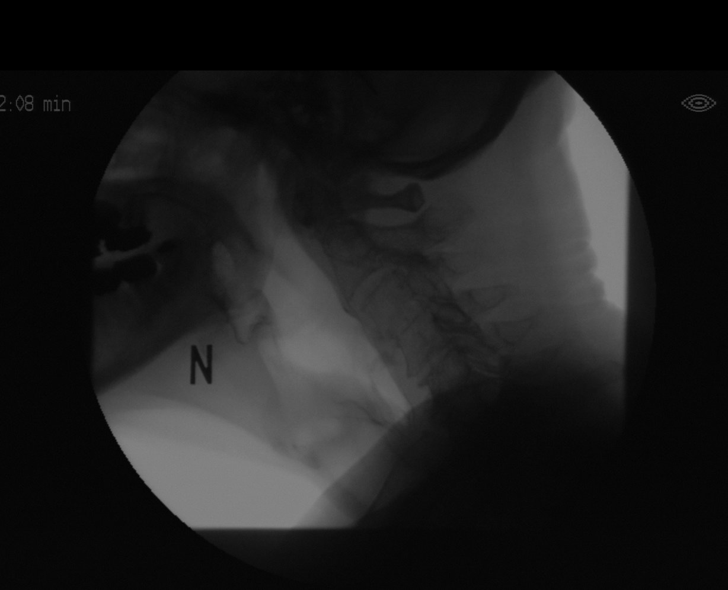

[Series 21: run · 1 of 252 frames shown (12 of 12)]
[frame 215/252]
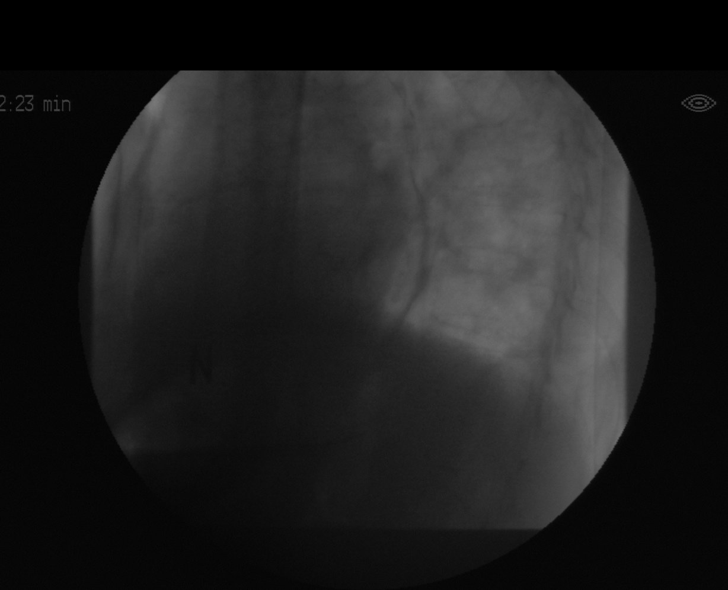

[12 of 24 positions shown; findings below may reference images not displayed]

FINDINGS: Vestibular Penetration: Repeated penetration with thin barium only.

Aspiration: Repeated aspiration with thin barium, not significantly
mitigated by various maneuvers. Patient responded with a cough. No
aspiration with the additional consistencies identified.

Other:  Mild oropharyngeal delay and vallecular pooling.
IMPRESSION: Repeated aspiration of thin barium.

Please refer to the Speech Pathologists report for complete details
and recommendations.

## 2021-10-27 NOTE — Progress Notes (Signed)
Modified Barium Swallow Progress Note ? ?Patient Details  ?Name: Clarence Myers ?MRN: [id number] ?Date of Birth: July 06, 1934 ? ?Today's Date: 10/27/2021 ? ?Modified Barium Swallow completed.  Full report located under Chart Review in the Imaging Section. ? ?Brief recommendations include the following: ? ?Clinical Impression ? Orally pt exhibited premature spill with thin into laryngeal vestibule. Mastication of solid and oral control and transit with thicker textures was adequate. Laryngeal penetration to the vocal cords before the swallow present with thin and once aspirated before the swallow. A compensatory strategy of volitional breath hold before the swallow was effective however he was unable to consistently perform without constant cueing. Chin tuck was ineffective to protect airway. There was minimal residue throughout in valleculae. Swallows with nectar thick were coordinated and safe. Recommend pt's liquids thickened to nectar consistency and SLP at SNF work with pt on performing breath hold strategy prior to swallow for possible upgrade to thin at bedside. Upgrade solid consistency to regular based on today's study, pills whole in puree, assist with meals and intermittent throat clears. . ?  ?Swallow Evaluation Recommendations ? ?   ? ? SLP Diet Recommendations: Regular solids;Nectar thick liquid ? ? Liquid Administration via: Cup;Straw ? ? Medication Administration: Whole meds with puree ? ? Supervision: Patient able to self feed;Full supervision/cueing for compensatory strategies ? ? Compensations: Slow rate;Small sips/bites;Clear throat intermittently ? ? Postural Changes: Seated upright at 90 degrees ? ? Oral Care Recommendations: Oral care BID ? ?   ? ? ? ?Houston Siren ?10/27/2021,2:58 PM ?Houston Siren M.Ed CCC-SLP ?Speech-Language Pathologist ?Office [phone] ? ?

## 2021-11-11 DIAGNOSIS — I639 Cerebral infarction, unspecified: Secondary | ICD-10-CM | POA: Diagnosis not present

## 2021-11-11 DIAGNOSIS — I10 Essential (primary) hypertension: Secondary | ICD-10-CM | POA: Diagnosis not present

## 2021-11-11 DIAGNOSIS — I350 Nonrheumatic aortic (valve) stenosis: Secondary | ICD-10-CM | POA: Diagnosis not present

## 2021-11-11 DIAGNOSIS — E785 Hyperlipidemia, unspecified: Secondary | ICD-10-CM | POA: Diagnosis not present

## 2021-11-12 DIAGNOSIS — I69393 Ataxia following cerebral infarction: Secondary | ICD-10-CM | POA: Diagnosis not present

## 2021-11-12 DIAGNOSIS — Z7902 Long term (current) use of antithrombotics/antiplatelets: Secondary | ICD-10-CM | POA: Diagnosis not present

## 2021-11-12 DIAGNOSIS — I69351 Hemiplegia and hemiparesis following cerebral infarction affecting right dominant side: Secondary | ICD-10-CM | POA: Diagnosis not present

## 2021-11-12 DIAGNOSIS — I69391 Dysphagia following cerebral infarction: Secondary | ICD-10-CM | POA: Diagnosis not present

## 2021-11-12 DIAGNOSIS — I69322 Dysarthria following cerebral infarction: Secondary | ICD-10-CM | POA: Diagnosis not present

## 2021-11-12 DIAGNOSIS — Z993 Dependence on wheelchair: Secondary | ICD-10-CM | POA: Diagnosis not present

## 2021-11-12 DIAGNOSIS — I10 Essential (primary) hypertension: Secondary | ICD-10-CM | POA: Diagnosis not present

## 2021-11-12 DIAGNOSIS — H409 Unspecified glaucoma: Secondary | ICD-10-CM | POA: Diagnosis not present

## 2021-11-12 DIAGNOSIS — R1310 Dysphagia, unspecified: Secondary | ICD-10-CM | POA: Diagnosis not present

## 2021-11-12 DIAGNOSIS — Z9181 History of falling: Secondary | ICD-10-CM | POA: Diagnosis not present

## 2021-11-12 DIAGNOSIS — I350 Nonrheumatic aortic (valve) stenosis: Secondary | ICD-10-CM | POA: Diagnosis not present

## 2021-11-12 DIAGNOSIS — Z8701 Personal history of pneumonia (recurrent): Secondary | ICD-10-CM | POA: Diagnosis not present

## 2021-11-12 DIAGNOSIS — I69328 Other speech and language deficits following cerebral infarction: Secondary | ICD-10-CM | POA: Diagnosis not present

## 2021-11-12 DIAGNOSIS — E785 Hyperlipidemia, unspecified: Secondary | ICD-10-CM | POA: Diagnosis not present

## 2021-11-12 DIAGNOSIS — I2510 Atherosclerotic heart disease of native coronary artery without angina pectoris: Secondary | ICD-10-CM | POA: Diagnosis not present

## 2021-11-13 DIAGNOSIS — Z7902 Long term (current) use of antithrombotics/antiplatelets: Secondary | ICD-10-CM | POA: Diagnosis not present

## 2021-11-13 DIAGNOSIS — F3289 Other specified depressive episodes: Secondary | ICD-10-CM | POA: Diagnosis not present

## 2021-11-13 DIAGNOSIS — I10 Essential (primary) hypertension: Secondary | ICD-10-CM | POA: Diagnosis not present

## 2021-11-13 DIAGNOSIS — K5900 Constipation, unspecified: Secondary | ICD-10-CM | POA: Diagnosis not present

## 2021-11-13 DIAGNOSIS — H4089 Other specified glaucoma: Secondary | ICD-10-CM | POA: Diagnosis not present

## 2021-11-13 DIAGNOSIS — K219 Gastro-esophageal reflux disease without esophagitis: Secondary | ICD-10-CM | POA: Diagnosis not present

## 2021-11-13 DIAGNOSIS — R54 Age-related physical debility: Secondary | ICD-10-CM | POA: Diagnosis not present

## 2021-11-13 DIAGNOSIS — E7849 Other hyperlipidemia: Secondary | ICD-10-CM | POA: Diagnosis not present

## 2021-11-13 DIAGNOSIS — Z8673 Personal history of transient ischemic attack (TIA), and cerebral infarction without residual deficits: Secondary | ICD-10-CM | POA: Diagnosis not present

## 2021-11-21 DIAGNOSIS — I10 Essential (primary) hypertension: Secondary | ICD-10-CM | POA: Diagnosis not present

## 2021-11-21 DIAGNOSIS — Z8701 Personal history of pneumonia (recurrent): Secondary | ICD-10-CM | POA: Diagnosis not present

## 2021-11-21 DIAGNOSIS — I69391 Dysphagia following cerebral infarction: Secondary | ICD-10-CM | POA: Diagnosis not present

## 2021-11-21 DIAGNOSIS — I69328 Other speech and language deficits following cerebral infarction: Secondary | ICD-10-CM | POA: Diagnosis not present

## 2021-11-21 DIAGNOSIS — Z7902 Long term (current) use of antithrombotics/antiplatelets: Secondary | ICD-10-CM | POA: Diagnosis not present

## 2021-11-21 DIAGNOSIS — Z993 Dependence on wheelchair: Secondary | ICD-10-CM | POA: Diagnosis not present

## 2021-11-21 DIAGNOSIS — I350 Nonrheumatic aortic (valve) stenosis: Secondary | ICD-10-CM | POA: Diagnosis not present

## 2021-11-21 DIAGNOSIS — R1310 Dysphagia, unspecified: Secondary | ICD-10-CM | POA: Diagnosis not present

## 2021-11-21 DIAGNOSIS — I69393 Ataxia following cerebral infarction: Secondary | ICD-10-CM | POA: Diagnosis not present

## 2021-11-21 DIAGNOSIS — H409 Unspecified glaucoma: Secondary | ICD-10-CM | POA: Diagnosis not present

## 2021-11-21 DIAGNOSIS — I2510 Atherosclerotic heart disease of native coronary artery without angina pectoris: Secondary | ICD-10-CM | POA: Diagnosis not present

## 2021-11-21 DIAGNOSIS — I69322 Dysarthria following cerebral infarction: Secondary | ICD-10-CM | POA: Diagnosis not present

## 2021-11-21 DIAGNOSIS — E785 Hyperlipidemia, unspecified: Secondary | ICD-10-CM | POA: Diagnosis not present

## 2021-11-21 DIAGNOSIS — I69351 Hemiplegia and hemiparesis following cerebral infarction affecting right dominant side: Secondary | ICD-10-CM | POA: Diagnosis not present

## 2021-11-21 DIAGNOSIS — Z9181 History of falling: Secondary | ICD-10-CM | POA: Diagnosis not present

## 2021-11-27 DIAGNOSIS — I639 Cerebral infarction, unspecified: Secondary | ICD-10-CM | POA: Diagnosis not present

## 2021-11-27 DIAGNOSIS — R2681 Unsteadiness on feet: Secondary | ICD-10-CM | POA: Diagnosis not present

## 2021-11-27 DIAGNOSIS — M6281 Muscle weakness (generalized): Secondary | ICD-10-CM | POA: Diagnosis not present

## 2021-11-27 DIAGNOSIS — G8101 Flaccid hemiplegia affecting right dominant side: Secondary | ICD-10-CM | POA: Diagnosis not present

## 2021-11-30 DIAGNOSIS — K219 Gastro-esophageal reflux disease without esophagitis: Secondary | ICD-10-CM | POA: Diagnosis not present

## 2021-11-30 DIAGNOSIS — E7849 Other hyperlipidemia: Secondary | ICD-10-CM | POA: Diagnosis not present

## 2021-11-30 DIAGNOSIS — R54 Age-related physical debility: Secondary | ICD-10-CM | POA: Diagnosis not present

## 2021-11-30 DIAGNOSIS — I69351 Hemiplegia and hemiparesis following cerebral infarction affecting right dominant side: Secondary | ICD-10-CM | POA: Diagnosis not present

## 2021-11-30 DIAGNOSIS — I10 Essential (primary) hypertension: Secondary | ICD-10-CM | POA: Diagnosis not present

## 2021-11-30 DIAGNOSIS — R630 Anorexia: Secondary | ICD-10-CM | POA: Diagnosis not present

## 2021-11-30 DIAGNOSIS — Z7902 Long term (current) use of antithrombotics/antiplatelets: Secondary | ICD-10-CM | POA: Diagnosis not present

## 2021-12-11 DIAGNOSIS — I2510 Atherosclerotic heart disease of native coronary artery without angina pectoris: Secondary | ICD-10-CM | POA: Diagnosis not present

## 2021-12-11 DIAGNOSIS — I639 Cerebral infarction, unspecified: Secondary | ICD-10-CM | POA: Diagnosis not present

## 2021-12-14 DIAGNOSIS — M6281 Muscle weakness (generalized): Secondary | ICD-10-CM | POA: Diagnosis not present

## 2021-12-14 DIAGNOSIS — I69351 Hemiplegia and hemiparesis following cerebral infarction affecting right dominant side: Secondary | ICD-10-CM | POA: Diagnosis not present

## 2021-12-14 DIAGNOSIS — I10 Essential (primary) hypertension: Secondary | ICD-10-CM | POA: Diagnosis not present

## 2021-12-14 DIAGNOSIS — F3289 Other specified depressive episodes: Secondary | ICD-10-CM | POA: Diagnosis not present

## 2021-12-15 DIAGNOSIS — I25119 Atherosclerotic heart disease of native coronary artery with unspecified angina pectoris: Secondary | ICD-10-CM | POA: Diagnosis not present

## 2021-12-15 DIAGNOSIS — I69351 Hemiplegia and hemiparesis following cerebral infarction affecting right dominant side: Secondary | ICD-10-CM | POA: Diagnosis not present

## 2021-12-15 DIAGNOSIS — F39 Unspecified mood [affective] disorder: Secondary | ICD-10-CM | POA: Diagnosis not present

## 2021-12-15 DIAGNOSIS — E46 Unspecified protein-calorie malnutrition: Secondary | ICD-10-CM | POA: Diagnosis not present

## 2021-12-15 DIAGNOSIS — I679 Cerebrovascular disease, unspecified: Secondary | ICD-10-CM | POA: Diagnosis not present

## 2021-12-15 DIAGNOSIS — I350 Nonrheumatic aortic (valve) stenosis: Secondary | ICD-10-CM | POA: Diagnosis not present

## 2021-12-15 DIAGNOSIS — I119 Hypertensive heart disease without heart failure: Secondary | ICD-10-CM | POA: Diagnosis not present

## 2021-12-20 DIAGNOSIS — I69391 Dysphagia following cerebral infarction: Secondary | ICD-10-CM | POA: Diagnosis not present

## 2021-12-20 DIAGNOSIS — I69393 Ataxia following cerebral infarction: Secondary | ICD-10-CM | POA: Diagnosis not present

## 2021-12-20 DIAGNOSIS — Z9181 History of falling: Secondary | ICD-10-CM | POA: Diagnosis not present

## 2021-12-20 DIAGNOSIS — Z8701 Personal history of pneumonia (recurrent): Secondary | ICD-10-CM | POA: Diagnosis not present

## 2021-12-20 DIAGNOSIS — I69328 Other speech and language deficits following cerebral infarction: Secondary | ICD-10-CM | POA: Diagnosis not present

## 2021-12-20 DIAGNOSIS — I350 Nonrheumatic aortic (valve) stenosis: Secondary | ICD-10-CM | POA: Diagnosis not present

## 2021-12-20 DIAGNOSIS — I2510 Atherosclerotic heart disease of native coronary artery without angina pectoris: Secondary | ICD-10-CM | POA: Diagnosis not present

## 2021-12-20 DIAGNOSIS — I10 Essential (primary) hypertension: Secondary | ICD-10-CM | POA: Diagnosis not present

## 2021-12-20 DIAGNOSIS — Z7902 Long term (current) use of antithrombotics/antiplatelets: Secondary | ICD-10-CM | POA: Diagnosis not present

## 2021-12-20 DIAGNOSIS — I69351 Hemiplegia and hemiparesis following cerebral infarction affecting right dominant side: Secondary | ICD-10-CM | POA: Diagnosis not present

## 2021-12-20 DIAGNOSIS — E785 Hyperlipidemia, unspecified: Secondary | ICD-10-CM | POA: Diagnosis not present

## 2021-12-20 DIAGNOSIS — H409 Unspecified glaucoma: Secondary | ICD-10-CM | POA: Diagnosis not present

## 2021-12-20 DIAGNOSIS — R1310 Dysphagia, unspecified: Secondary | ICD-10-CM | POA: Diagnosis not present

## 2021-12-20 DIAGNOSIS — I69322 Dysarthria following cerebral infarction: Secondary | ICD-10-CM | POA: Diagnosis not present

## 2021-12-20 DIAGNOSIS — Z993 Dependence on wheelchair: Secondary | ICD-10-CM | POA: Diagnosis not present

## 2021-12-22 DIAGNOSIS — I69328 Other speech and language deficits following cerebral infarction: Secondary | ICD-10-CM | POA: Diagnosis not present

## 2021-12-22 DIAGNOSIS — R1310 Dysphagia, unspecified: Secondary | ICD-10-CM | POA: Diagnosis not present

## 2021-12-22 DIAGNOSIS — I350 Nonrheumatic aortic (valve) stenosis: Secondary | ICD-10-CM | POA: Diagnosis not present

## 2021-12-22 DIAGNOSIS — I10 Essential (primary) hypertension: Secondary | ICD-10-CM | POA: Diagnosis not present

## 2021-12-22 DIAGNOSIS — I69322 Dysarthria following cerebral infarction: Secondary | ICD-10-CM | POA: Diagnosis not present

## 2021-12-22 DIAGNOSIS — I2510 Atherosclerotic heart disease of native coronary artery without angina pectoris: Secondary | ICD-10-CM | POA: Diagnosis not present

## 2021-12-22 DIAGNOSIS — I69393 Ataxia following cerebral infarction: Secondary | ICD-10-CM | POA: Diagnosis not present

## 2021-12-22 DIAGNOSIS — E785 Hyperlipidemia, unspecified: Secondary | ICD-10-CM | POA: Diagnosis not present

## 2021-12-22 DIAGNOSIS — H409 Unspecified glaucoma: Secondary | ICD-10-CM | POA: Diagnosis not present

## 2021-12-22 DIAGNOSIS — Z7902 Long term (current) use of antithrombotics/antiplatelets: Secondary | ICD-10-CM | POA: Diagnosis not present

## 2021-12-22 DIAGNOSIS — Z9181 History of falling: Secondary | ICD-10-CM | POA: Diagnosis not present

## 2021-12-22 DIAGNOSIS — Z8701 Personal history of pneumonia (recurrent): Secondary | ICD-10-CM | POA: Diagnosis not present

## 2021-12-22 DIAGNOSIS — I69391 Dysphagia following cerebral infarction: Secondary | ICD-10-CM | POA: Diagnosis not present

## 2021-12-22 DIAGNOSIS — I69351 Hemiplegia and hemiparesis following cerebral infarction affecting right dominant side: Secondary | ICD-10-CM | POA: Diagnosis not present

## 2021-12-22 DIAGNOSIS — Z993 Dependence on wheelchair: Secondary | ICD-10-CM | POA: Diagnosis not present

## 2021-12-27 DIAGNOSIS — M6281 Muscle weakness (generalized): Secondary | ICD-10-CM | POA: Diagnosis not present

## 2021-12-27 DIAGNOSIS — G8101 Flaccid hemiplegia affecting right dominant side: Secondary | ICD-10-CM | POA: Diagnosis not present

## 2021-12-27 DIAGNOSIS — I639 Cerebral infarction, unspecified: Secondary | ICD-10-CM | POA: Diagnosis not present

## 2021-12-27 DIAGNOSIS — R2681 Unsteadiness on feet: Secondary | ICD-10-CM | POA: Diagnosis not present

## 2021-12-29 ENCOUNTER — Emergency Department (HOSPITAL_COMMUNITY): Payer: PPO

## 2021-12-29 ENCOUNTER — Other Ambulatory Visit: Payer: Self-pay

## 2021-12-29 ENCOUNTER — Observation Stay (HOSPITAL_COMMUNITY)
Admission: EM | Admit: 2021-12-29 | Discharge: 2021-12-31 | Disposition: A | Discharge status: Home Health | Payer: PPO | Attending: Internal Medicine | Admitting: Internal Medicine

## 2021-12-29 DIAGNOSIS — Z79899 Other long term (current) drug therapy: Secondary | ICD-10-CM | POA: Insufficient documentation

## 2021-12-29 DIAGNOSIS — I639 Cerebral infarction, unspecified: Principal | ICD-10-CM | POA: Diagnosis present

## 2021-12-29 DIAGNOSIS — R531 Weakness: Secondary | ICD-10-CM | POA: Diagnosis not present

## 2021-12-29 DIAGNOSIS — R2689 Other abnormalities of gait and mobility: Secondary | ICD-10-CM | POA: Insufficient documentation

## 2021-12-29 DIAGNOSIS — I10 Essential (primary) hypertension: Secondary | ICD-10-CM | POA: Diagnosis present

## 2021-12-29 DIAGNOSIS — Z20822 Contact with and (suspected) exposure to covid-19: Secondary | ICD-10-CM | POA: Insufficient documentation

## 2021-12-29 DIAGNOSIS — R0602 Shortness of breath: Secondary | ICD-10-CM | POA: Diagnosis not present

## 2021-12-29 DIAGNOSIS — R1313 Dysphagia, pharyngeal phase: Secondary | ICD-10-CM | POA: Diagnosis not present

## 2021-12-29 DIAGNOSIS — I2510 Atherosclerotic heart disease of native coronary artery without angina pectoris: Secondary | ICD-10-CM | POA: Diagnosis not present

## 2021-12-29 DIAGNOSIS — E785 Hyperlipidemia, unspecified: Secondary | ICD-10-CM | POA: Diagnosis present

## 2021-12-29 DIAGNOSIS — I491 Atrial premature depolarization: Secondary | ICD-10-CM | POA: Diagnosis not present

## 2021-12-29 DIAGNOSIS — Z7982 Long term (current) use of aspirin: Secondary | ICD-10-CM | POA: Insufficient documentation

## 2021-12-29 DIAGNOSIS — Z7902 Long term (current) use of antithrombotics/antiplatelets: Secondary | ICD-10-CM | POA: Diagnosis not present

## 2021-12-29 DIAGNOSIS — R059 Cough, unspecified: Secondary | ICD-10-CM | POA: Diagnosis not present

## 2021-12-29 DIAGNOSIS — R42 Dizziness and giddiness: Secondary | ICD-10-CM | POA: Diagnosis not present

## 2021-12-29 DIAGNOSIS — G319 Degenerative disease of nervous system, unspecified: Secondary | ICD-10-CM | POA: Diagnosis not present

## 2021-12-29 LAB — URINALYSIS, ROUTINE W REFLEX MICROSCOPIC
Bilirubin Urine: NEGATIVE
Glucose, UA: NEGATIVE mg/dL
Hgb urine dipstick: NEGATIVE
Ketones, ur: NEGATIVE mg/dL
Leukocytes,Ua: NEGATIVE
Nitrite: NEGATIVE
Protein, ur: NEGATIVE mg/dL
Specific Gravity, Urine: 1.009 (ref 1.005–1.030)
pH: 5 (ref 5.0–8.0)

## 2021-12-29 LAB — CBC WITH DIFFERENTIAL/PLATELET
Abs Immature Granulocytes: 0.02 10*3/uL (ref 0.00–0.07)
Basophils Absolute: 0.1 10*3/uL (ref 0.0–0.1)
Basophils Relative: 1 %
Eosinophils Absolute: 0.4 10*3/uL (ref 0.0–0.5)
Eosinophils Relative: 5 %
HCT: 34.9 % — ABNORMAL LOW (ref 39.0–52.0)
Hemoglobin: 11.5 g/dL — ABNORMAL LOW (ref 13.0–17.0)
Immature Granulocytes: 0 %
Lymphocytes Relative: 35 %
Lymphs Abs: 3 10*3/uL (ref 0.7–4.0)
MCH: 30.2 pg (ref 26.0–34.0)
MCHC: 33 g/dL (ref 30.0–36.0)
MCV: 91.6 fL (ref 80.0–100.0)
Monocytes Absolute: 0.8 10*3/uL (ref 0.1–1.0)
Monocytes Relative: 9 %
Neutro Abs: 4.4 10*3/uL (ref 1.7–7.7)
Neutrophils Relative %: 50 %
Platelets: 271 10*3/uL (ref 150–400)
RBC: 3.81 MIL/uL — ABNORMAL LOW (ref 4.22–5.81)
RDW: 13.7 % (ref 11.5–15.5)
WBC: 8.8 10*3/uL (ref 4.0–10.5)
nRBC: 0 % (ref 0.0–0.2)

## 2021-12-29 LAB — COMPREHENSIVE METABOLIC PANEL
ALT: 14 U/L (ref 0–44)
AST: 24 U/L (ref 15–41)
Albumin: 3.1 g/dL — ABNORMAL LOW (ref 3.5–5.0)
Alkaline Phosphatase: 68 U/L (ref 38–126)
Anion gap: 8 (ref 5–15)
BUN: 30 mg/dL — ABNORMAL HIGH (ref 8–23)
CO2: 25 mmol/L (ref 22–32)
Calcium: 8.9 mg/dL (ref 8.9–10.3)
Chloride: 106 mmol/L (ref 98–111)
Creatinine, Ser: 1.01 mg/dL (ref 0.61–1.24)
GFR, Estimated: >60 mL/min (ref >60)
Glucose, Bld: 112 mg/dL — ABNORMAL HIGH (ref 70–99)
Potassium: 3.8 mmol/L (ref 3.5–5.1)
Sodium: 139 mmol/L (ref 135–145)
Total Bilirubin: 0.3 mg/dL (ref 0.3–1.2)
Total Protein: 6.6 g/dL (ref 6.5–8.1)

## 2021-12-29 LAB — RESP PANEL BY RT-PCR (FLU A&B, COVID) ARPGX2
Influenza A by PCR: NEGATIVE
Influenza B by PCR: NEGATIVE
SARS Coronavirus 2 by RT PCR: NEGATIVE

## 2021-12-29 IMAGING — CT CT HEAD W/O CM
3 series · 16 of 47 positions shown, 19 images · non-contrast
Comparison: [DATE]

CLINICAL DATA: Dizziness and weakness, initial encounter



[Series 3: head 5.0 h30s · axial · 0.44mm/px · z∈[+165,+300]mm · 10 of 33 slices shown, 13 images]
[im 3/33  brain]
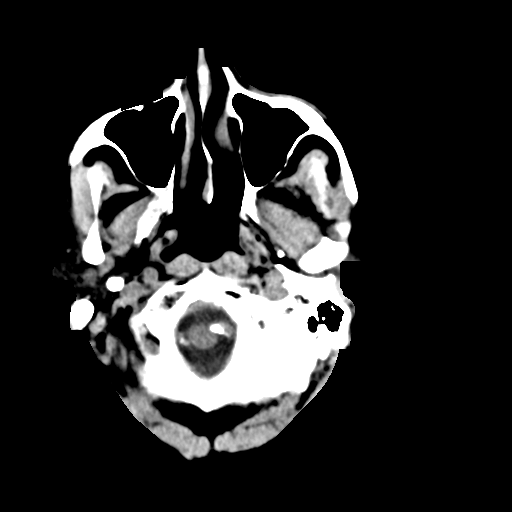
[im 3/33  bone]
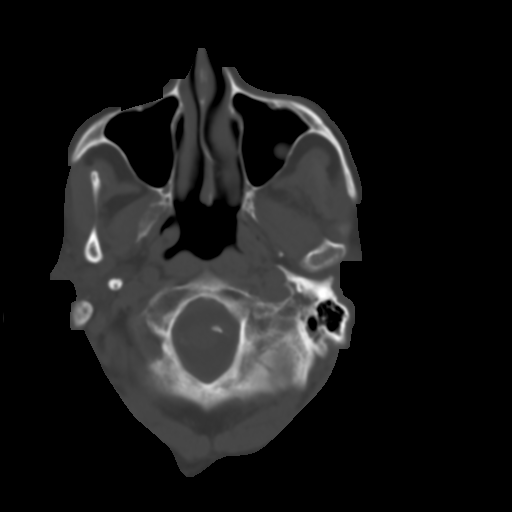
[im 6/33  brain]
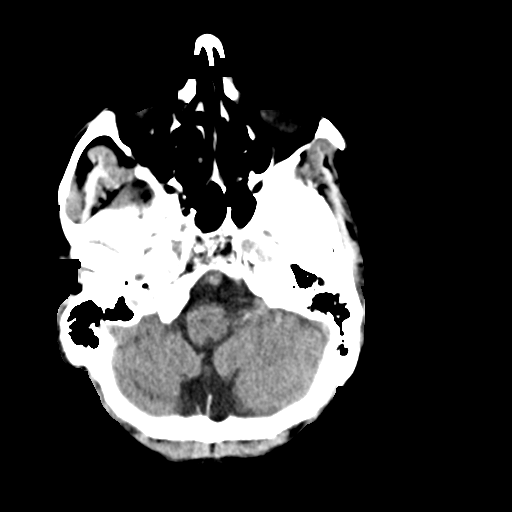
[im 9/33  brain]
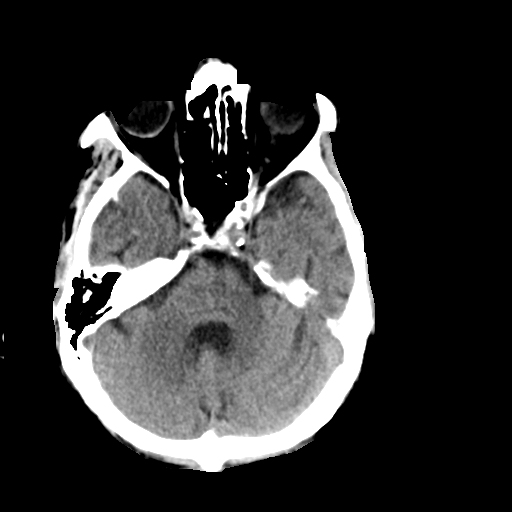
[im 12/33  brain]
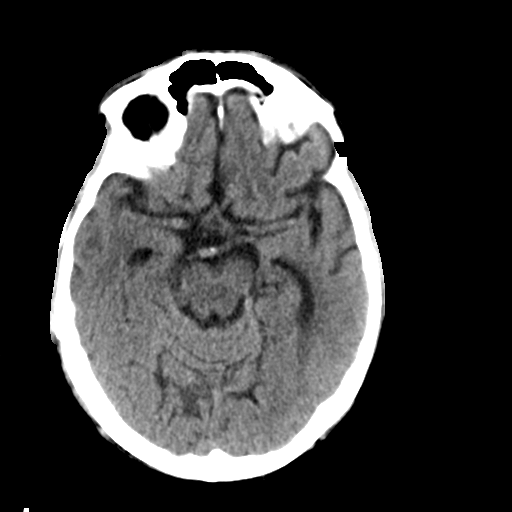
[im 15/33  brain]
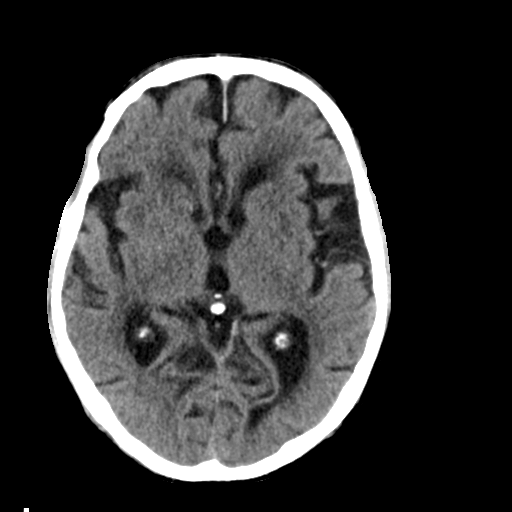
[im 15/33  bone]
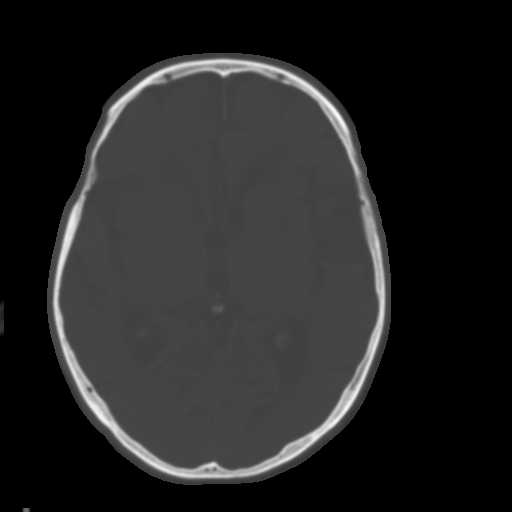
[im 18/33  brain]
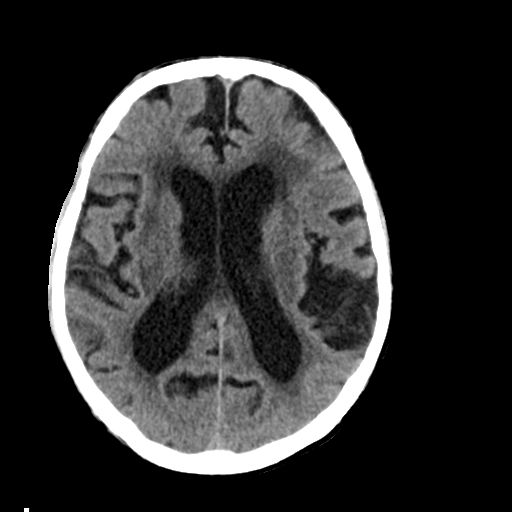
[im 21/33  brain]
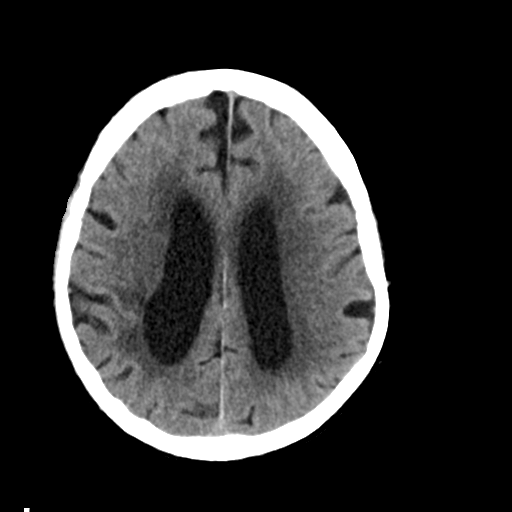
[im 25/33  brain]
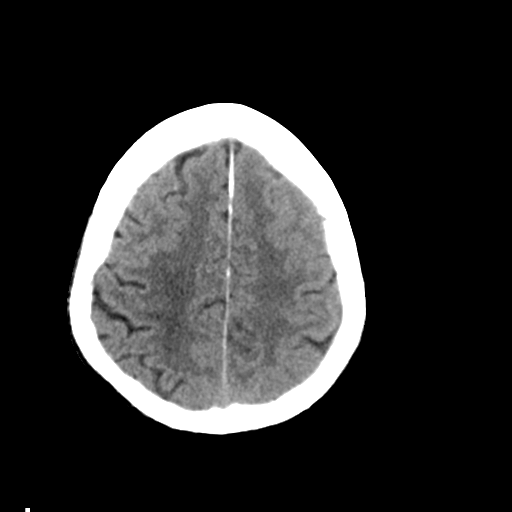
[im 27/33  brain]
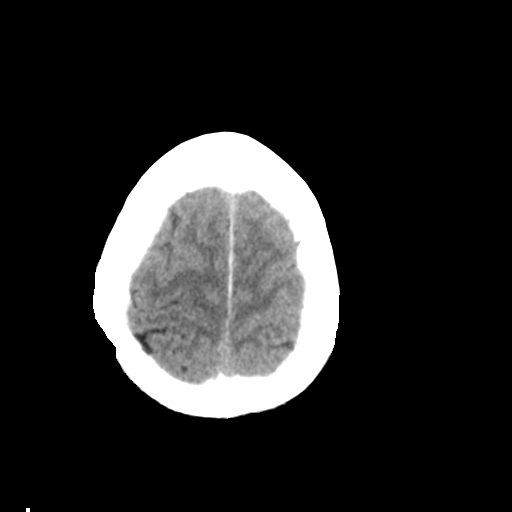
[im 27/33  bone]
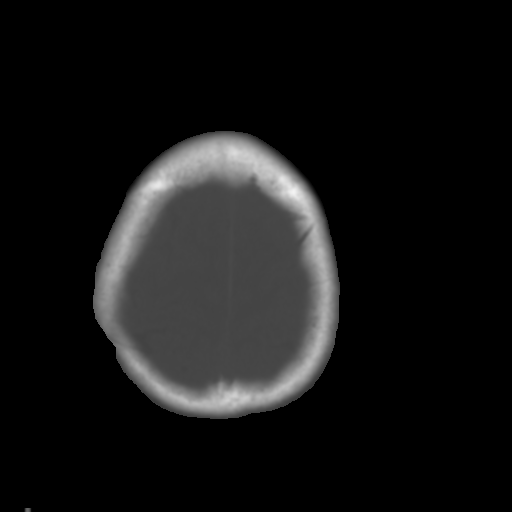
[im 30/33  brain]
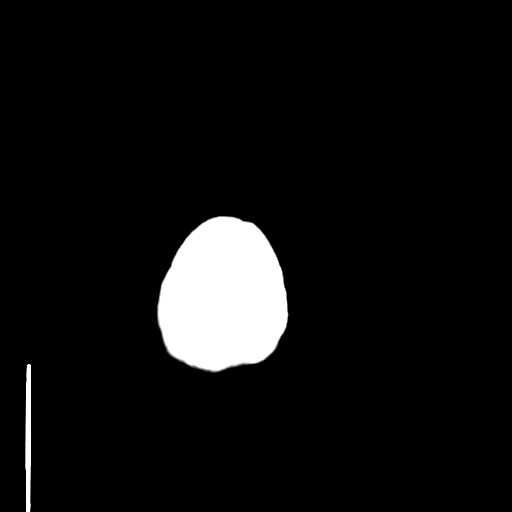

[Series 5: head 3.0 mpr cor · coronal · 0.33mm/px · 3 of 67 slices shown]
[im 23/67  brain]
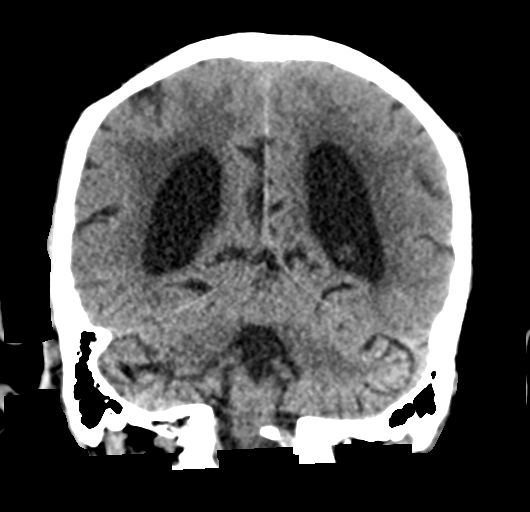
[im 30/67  brain]
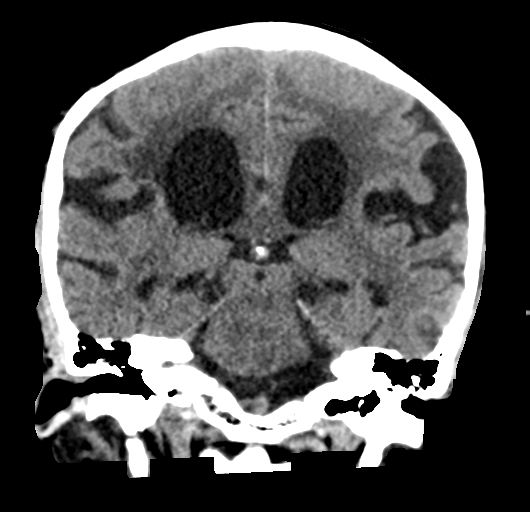
[im 37/67  brain]
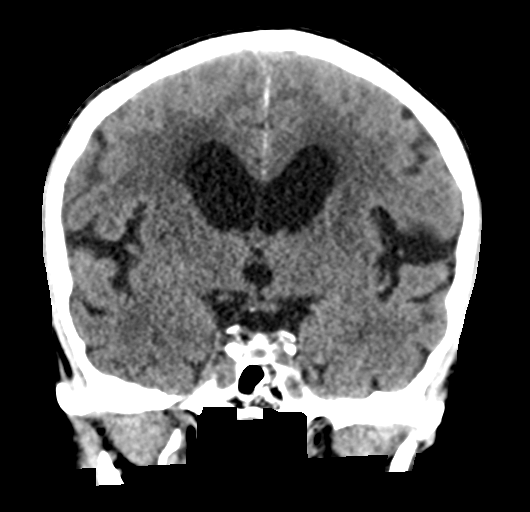

[Series 6: head 3.0 mpr sag · sagittal · 0.33mm/px · 3 of 66 slices shown]
[im 22/66  brain]
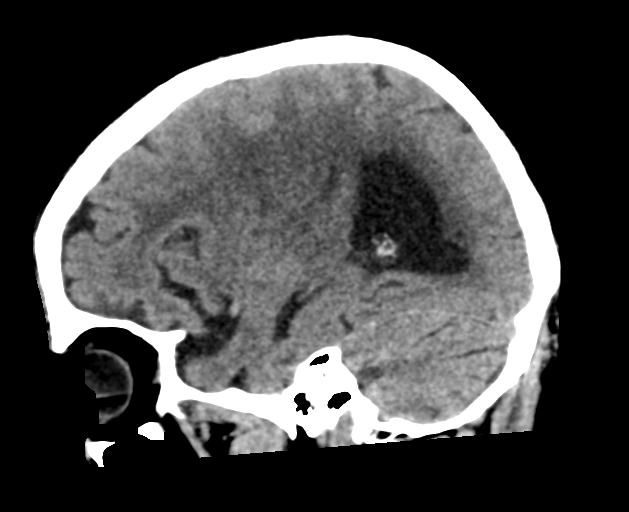
[im 33/66  brain]
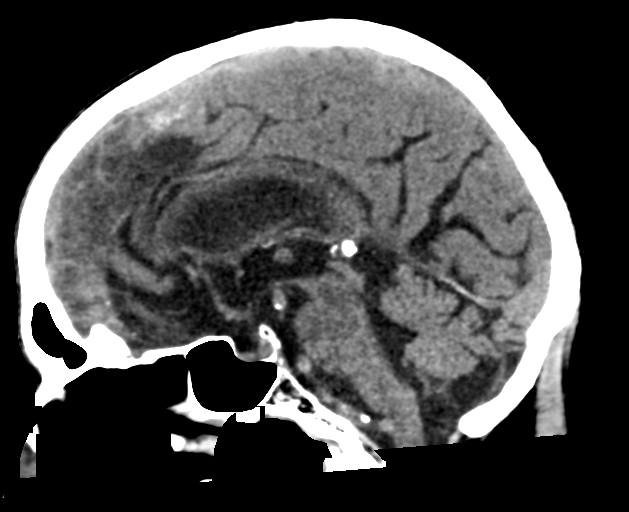
[im 44/66  brain]
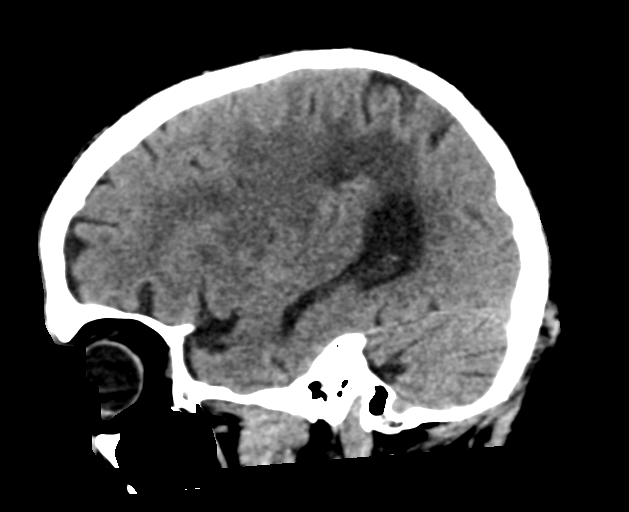

[16 of 47 positions shown; findings below may reference images not displayed]

FINDINGS: Brain: No evidence of acute infarction, hemorrhage, hydrocephalus,
extra-axial collection or mass lesion/mass effect. There are changes
consistent with lacunar infarct in the left half of the pons
progressed from the prior exam as well as areas of chronic white
matter ischemia. Atrophic changes are noted as well.

Vascular: No hyperdense vessel or unexpected calcification.

Skull: Normal. Negative for fracture or focal lesion.

Sinuses/Orbits: No acute finding.

Other: None.
IMPRESSION: Chronic atrophic and ischemic changes without acute abnormality.

## 2021-12-29 IMAGING — DX DG CHEST 1V PORT
1 series · 1 of 1 positions shown · non-contrast
Comparison: [DATE], [DATE]

CLINICAL DATA: Cough and short of breath

EXAM:
PORTABLE CHEST 1 VIEW

[chest]
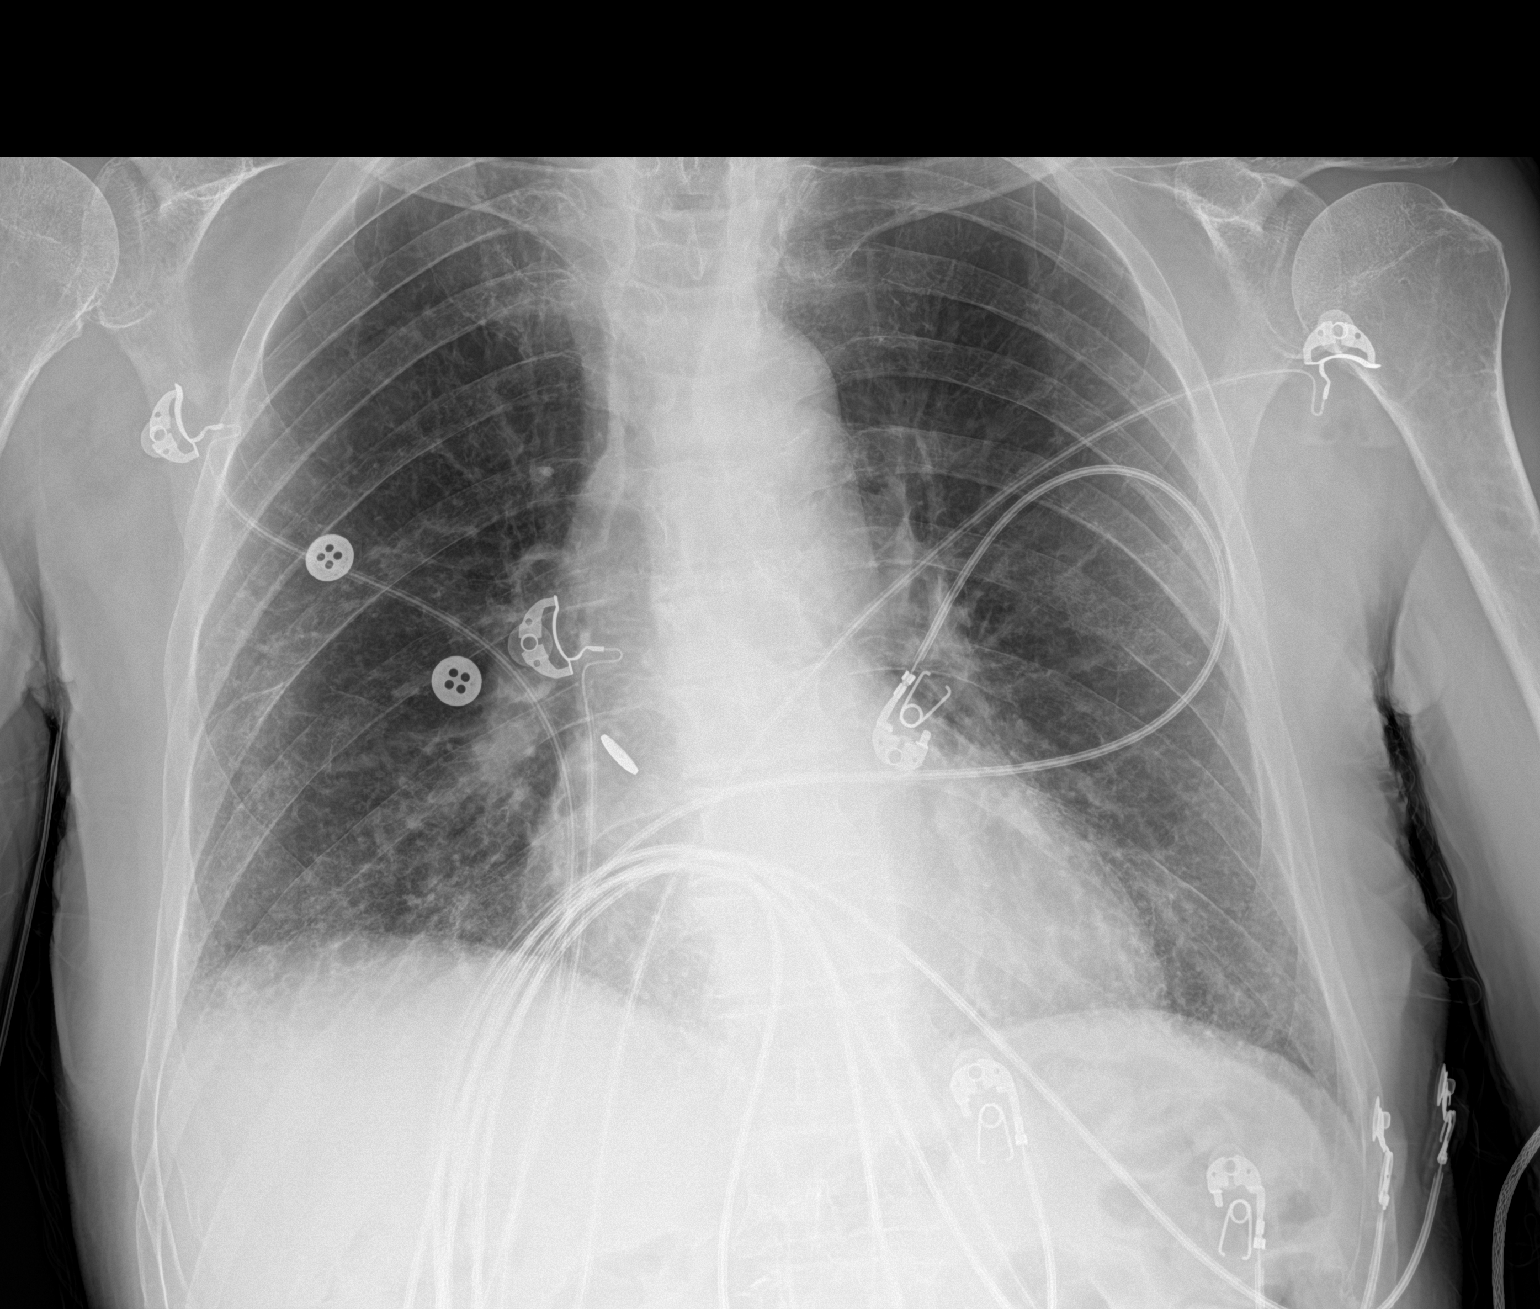

[1 of 1 positions shown; findings below may reference images not displayed]

FINDINGS: Mild reticular opacity at the bases suspicious for chronic
interstitial lung disease and fibrosis. No acute consolidation,
pleural effusion, or pneumothorax. Stable cardiomediastinal
silhouette.
IMPRESSION: 1. No acute airspace disease
2. Mild reticular opacity at the bases suspicious for
fibrosis/chronic lung disease.

## 2021-12-29 MED ORDER — LACTATED RINGERS IV SOLN: INTRAVENOUS | Status: DC

## 2021-12-29 NOTE — ED Provider Notes (Signed)
?[facility] ?Provider Note ? ? ?CSN: [id number] ?Arrival date & time: 12/29/21  1842 ? ?  ? ?History ? ?Chief Complaint  ?Patient presents with  ? generalized weakness  ? ? ?Clarence Myers is a 88 y.o. male. ? ?88 year old male presents to cute onset of diffuse weakness.  History of CVA in the past.  Denies any focality to his weakness.  No recent headaches.  No urinary symptoms.  No chest or abdominal discomfort.  No fever or chills.  No new medications.  States he is having trouble just getting around.  EMS called and patient CBG was 121 and patient transported here ? ? ?  ? ?Home Medications ?Prior to Admission medications   ?Medication Sig Start Date End Date Taking? Authorizing Provider  ?acetaminophen (TYLENOL) 325 MG tablet Take 2 tablets (650 mg total) by mouth every 4 (four) hours as needed for mild pain (or temp > 37.5 C (99.5 F)). 10/09/21   Cherene Altes, MD  ?aspirin EC 81 MG EC tablet Take 1 tablet (81 mg total) by mouth daily for 21 days. Swallow whole. 09/30/21 10/21/21  Cherene Altes, MD  ?clopidogrel (PLAVIX) 75 MG tablet Take 1 tablet (75 mg total) by mouth daily. 10/10/21   Cherene Altes, MD  ?LUMIGAN 0.01 % SOLN Place 1 drop into both eyes at bedtime. 03/04/21   [provider]  ?metoprolol tartrate (LOPRESSOR) 25 MG tablet Take 12.5 mg by mouth 2 (two) times daily. 08/07/21   [provider]  ?Multiple Vitamin (MULTI-VITAMIN PO) Take by mouth daily.      [provider]  ?multivitamin-lutein (OCUVITE-LUTEIN) CAPS Take 1 capsule by mouth daily.      [provider]  ?Omega-3 Fatty Acids (FISH OIL PO) Take by mouth daily.      [provider]  ?omeprazole (PRILOSEC) 20 MG capsule Take 20 mg by mouth daily. 01/21/21   [provider]  ?RESTASIS 0.05 % ophthalmic emulsion Place 1 drop into both eyes 2 (two) times daily. 10/25/20   [provider]  ?simvastatin (ZOCOR) 40 MG tablet Take 40 mg  by mouth daily.      [provider]  ?   ? ?Allergies    ?Blue crab (cavinectes sapidus) allergy skin test and Other   ? ?Review of Systems   ?Review of Systems  ?All other systems reviewed and are negative. ? ?Physical Exam ?Updated Vital Signs ?BP 138/71 (BP Location: Right Arm)   Pulse 69   Temp 97.9 ?F (36.6 ?C) (Oral)   Resp 16   Ht 1.676 m ('5\' 6"'$ )   Wt 52.6 kg   SpO2 95%   BMI 18.72 kg/m?  ?Physical Exam ?Vitals and nursing note reviewed.  ?Constitutional:   ?   General: He is not in acute distress. ?   Appearance: Normal appearance. He is well-developed. He is not toxic-appearing.  ?HENT:  ?   Head: Normocephalic and atraumatic.  ?Eyes:  ?   General: Lids are normal.  ?   Conjunctiva/sclera: Conjunctivae normal.  ?   Pupils: Pupils are equal, round, and reactive to light.  ?Neck:  ?   Thyroid: No thyroid mass.  ?   Trachea: No tracheal deviation.  ?Cardiovascular:  ?   Rate and Rhythm: Normal rate and regular rhythm.  ?   Heart sounds: Normal heart sounds. No murmur heard. ?  No gallop.  ?Pulmonary:  ?   Effort: Pulmonary effort is normal. No  respiratory distress.  ?   Breath sounds: Normal breath sounds. No stridor. No decreased breath sounds, wheezing, rhonchi or rales.  ?Abdominal:  ?   General: There is no distension.  ?   Palpations: Abdomen is soft.  ?   Tenderness: There is no abdominal tenderness. There is no rebound.  ?Musculoskeletal:     ?   General: No tenderness. Normal range of motion.  ?   Cervical back: Normal range of motion and neck supple.  ?Skin: ?   General: Skin is warm and dry.  ?   Findings: No abrasion or rash.  ?Neurological:  ?   General: No focal deficit present.  ?   Mental Status: He is alert and oriented to person, place, and time. Mental status is at baseline.  ?   GCS: GCS eye subscore is 4. GCS verbal subscore is 5. GCS motor subscore is 6.  ?   Cranial Nerves: No cranial nerve deficit.  ?   Sensory: No sensory deficit.  ?   Motor: Motor function is intact.   ?Psychiatric:     ?   Attention and Perception: Attention normal.     ?   Speech: Speech normal.     ?   Behavior: Behavior normal.  ? ? ?ED Results / Procedures / Treatments   ?Labs ?(all labs ordered are listed, but only abnormal results are displayed) ?Labs Reviewed  ?RESP PANEL BY RT-PCR (FLU A&B, COVID) ARPGX2  ?COMPREHENSIVE METABOLIC PANEL  ?CBC WITH DIFFERENTIAL/PLATELET  ?URINALYSIS, ROUTINE W REFLEX MICROSCOPIC  ? ? ?EKG ?EKG Interpretation ? ?Date/Time:  Friday Dec 29 2021 19:09:22 EDT ?Ventricular Rate:  72 ?PR Interval:  174 ?QRS Duration: 104 ?QT Interval:  425 ?QTC Calculation: 466 ?R Axis:   0 ?Text Interpretation: Sinus rhythm Atrial premature complexes Confirmed by Lacretia Leigh ([id number]) on 12/29/2021 7:42:39 PM ? ?Radiology ?No results found. ? ?Procedures ?Procedures  ? ? ?Medications Ordered in ED ?Medications  ?lactated ringers infusion (has no administration in time range)  ? ? ?ED Course/ Medical Decision Making/ A&P ?  ?                        ?Medical Decision Making ?Amount and/or Complexity of Data Reviewed ?Labs: ordered. ?Radiology: ordered. ?ECG/medicine tests: ordered. ? ?Risk ?Prescription drug management. ? ? ?Patient is EKG per my interpretation showed no signs of acute coronary ischemia.  Patient is urinalysis negative for infection.  Head CT per my interpretation shows no acute findings.  BC shows no evidence of anemia or leukocytosis.  Patient's COVID test was negative.  Consider infection but feel less likely.  Considered stroke and patient will require MRI of his brain.  Case sent to Dr. Nicholes Stairs ? ? ? ? ? ? ? ?Final Clinical Impression(s) / ED Diagnoses ?Final diagnoses:  ?None  ? ? ?Rx / DC Orders ?ED Discharge Orders   ? ? None  ? ?  ? ? ?  ?Lacretia Leigh, MD ?12/29/21 2330 ? ?

## 2021-12-29 NOTE — ED Triage Notes (Signed)
Pt bib [facility] from home c/o generalized weakness since this am. Pt as hx of CVA, is in therapy for that, most of his deficits have resolved. Pt also has UTI hx. Pt states that he "is feeling better now." Denies any pain, dizziness, or SOB. Pt states that his caregiver is having to help him more with his transfers and moving around with a walker. ? ?EMS vitals ?130/74 BP ?78HR ?98% SpO2 ?121 CBG ?

## 2021-12-29 NOTE — ED Notes (Signed)
Pt daughter informed RN pt CVA Feb 10th, 2023. ?

## 2021-12-30 ENCOUNTER — Observation Stay (HOSPITAL_COMMUNITY): Payer: PPO

## 2021-12-30 ENCOUNTER — Emergency Department (HOSPITAL_COMMUNITY): Payer: PPO

## 2021-12-30 DIAGNOSIS — G319 Degenerative disease of nervous system, unspecified: Secondary | ICD-10-CM | POA: Diagnosis not present

## 2021-12-30 DIAGNOSIS — I10 Essential (primary) hypertension: Secondary | ICD-10-CM | POA: Diagnosis not present

## 2021-12-30 DIAGNOSIS — I672 Cerebral atherosclerosis: Secondary | ICD-10-CM | POA: Diagnosis not present

## 2021-12-30 DIAGNOSIS — I6503 Occlusion and stenosis of bilateral vertebral arteries: Secondary | ICD-10-CM | POA: Diagnosis not present

## 2021-12-30 DIAGNOSIS — E782 Mixed hyperlipidemia: Secondary | ICD-10-CM

## 2021-12-30 DIAGNOSIS — I63331 Cerebral infarction due to thrombosis of right posterior cerebral artery: Secondary | ICD-10-CM | POA: Diagnosis not present

## 2021-12-30 DIAGNOSIS — I639 Cerebral infarction, unspecified: Secondary | ICD-10-CM

## 2021-12-30 DIAGNOSIS — I63233 Cerebral infarction due to unspecified occlusion or stenosis of bilateral carotid arteries: Secondary | ICD-10-CM | POA: Diagnosis not present

## 2021-12-30 HISTORY — DX: Cerebral infarction, unspecified: I63.9

## 2021-12-30 LAB — LIPID PANEL
Cholesterol: 153 mg/dL (ref 0–200)
HDL: 50 mg/dL (ref >40)
LDL Cholesterol: 97 mg/dL (ref 0–99)
Total CHOL/HDL Ratio: 3.1 RATIO
Triglycerides: 32 mg/dL (ref <150)
VLDL: 6 mg/dL (ref 0–40)

## 2021-12-30 LAB — CBG MONITORING, ED: Glucose-Capillary: 97 mg/dL (ref 70–99)

## 2021-12-30 IMAGING — MR MR HEAD W/O CM
12 of 13 series · 44 of 48 positions shown · non-contrast
Comparison: Prior CT from [DATE].

CLINICAL DATA: Initial evaluation for neuro deficit, stroke
suspected.

EXAM:
MRI HEAD WITHOUT CONTRAST
TECHNIQUE: Multiplanar, multiecho pulse sequences of the brain and surrounding
structures were obtained without intravenous contrast.

[Series 9: DWI · axial · 3.0mm · 0.88mm/px · z∈[-48,+92]mm · 7 of 96 slices shown (1 of 4)]
[im 1/96]
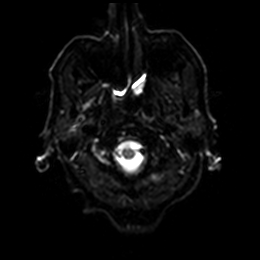
[im 16/96]
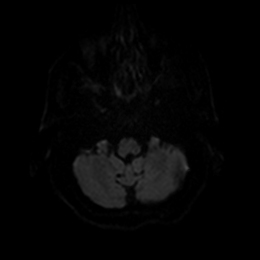
[im 32/96]
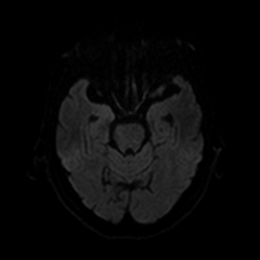
[im 48/96]
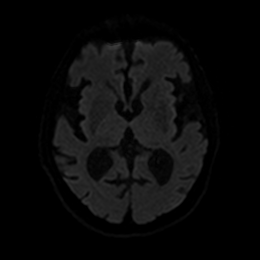
[im 64/96]
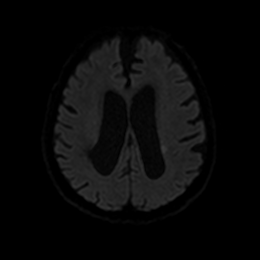
[im 80/96]
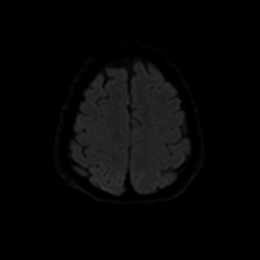
[im 96/96]
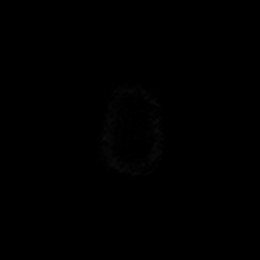

[Series 10: DWI · axial · 3.0mm · 0.88mm/px · z∈[-48,+92]mm · 4 of 48 slices shown (2 of 4)]
[im 1/48]
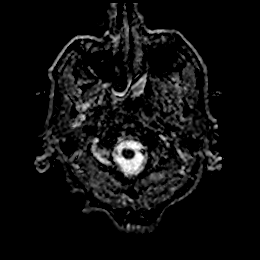
[im 16/48]
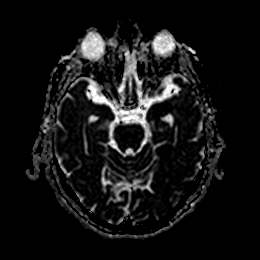
[im 32/48]
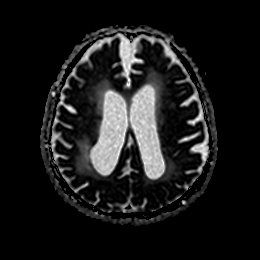
[im 48/48]
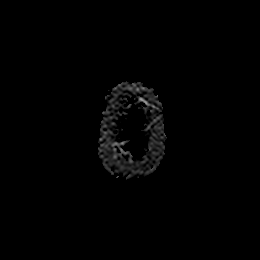

[Series 11: DWI · coronal · 4.0mm · 0.88mm/px · 5 of 64 slices shown (3 of 4)]
[im 1/64]
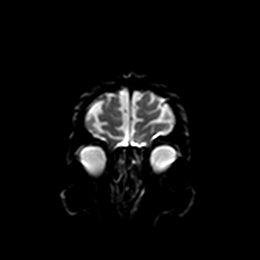
[im 16/64]
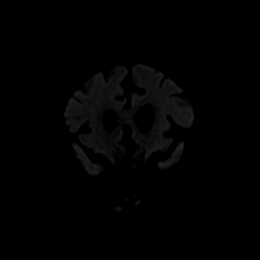
[im 32/64]
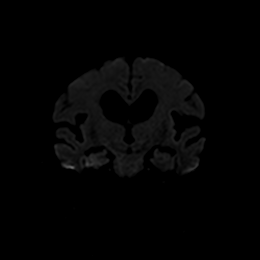
[im 48/64]
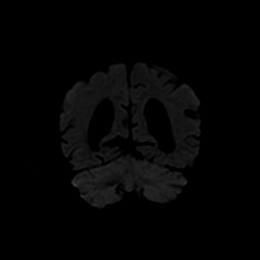
[im 64/64]
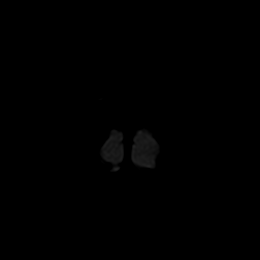

[Series 12: DWI · coronal · 4.0mm · 0.88mm/px · 2 of 32 slices shown (4 of 4)]
[im 1/32]
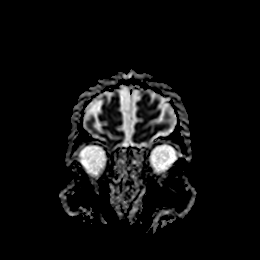
[im 32/32]
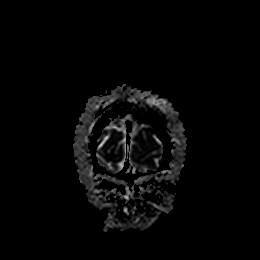

[Series 13: T1 · sagittal · 5.0mm · 0.75mm/px · 2 of 23 slices shown]
[im 1/23]
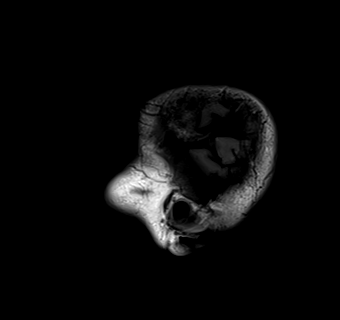
[im 23/23]
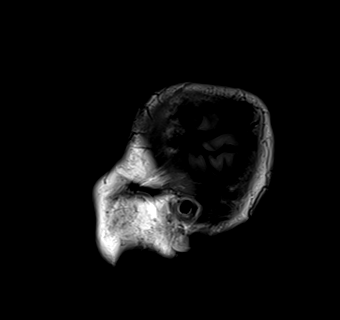

[Series 14: T2 · axial · 5.0mm · 0.72mm/px · z∈[-54,+87]mm · 2 of 25 slices shown (1 of 2)]
[im 1/25]
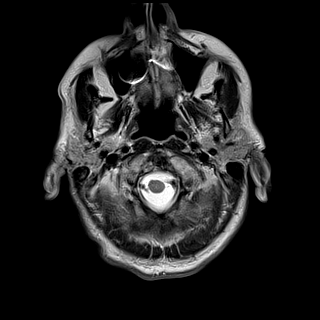
[im 25/25]
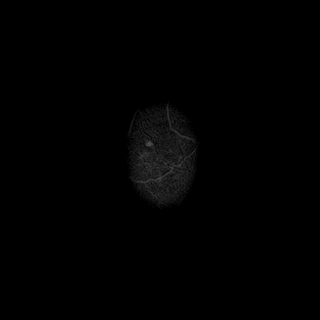

[Series 15: FLAIR · axial · 5.0mm · 0.45mm/px · z∈[-56,+86]mm · 2 of 25 slices shown]
[im 1/25]
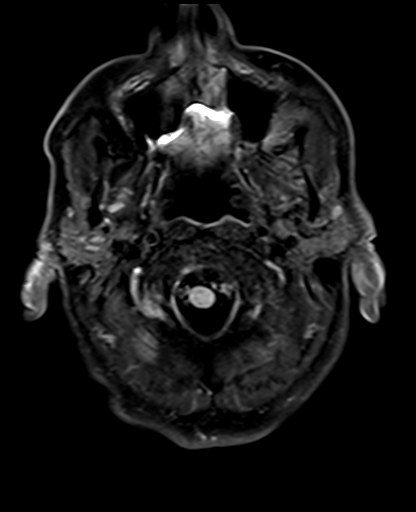
[im 25/25]
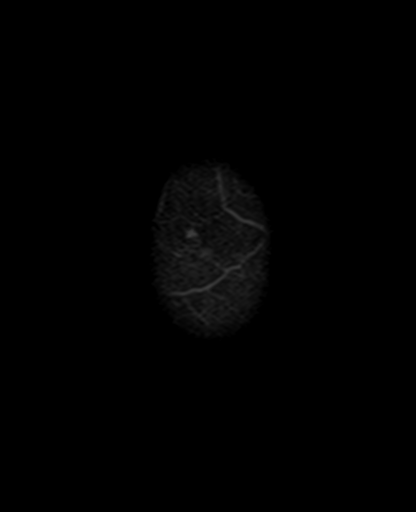

[Series 16: mag_images · axial · 3.0mm · 0.90mm/px · z∈[-67,+108]mm · 5 of 60 slices shown]
[im 1/60]
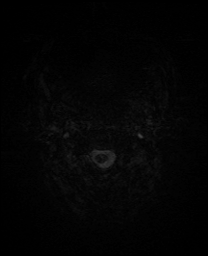
[im 15/60]
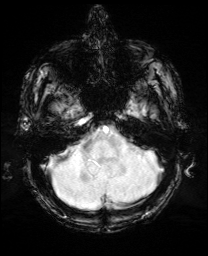
[im 30/60]
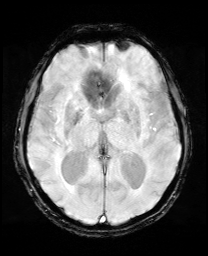
[im 45/60]
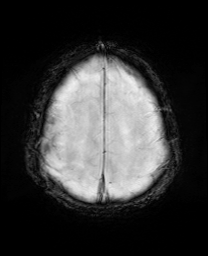
[im 60/60]
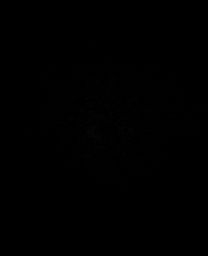

[Series 17: pha_images · axial · 3.0mm · 0.90mm/px · z∈[-67,+102]mm · 4 of 57 slices shown]
[im 1/57]
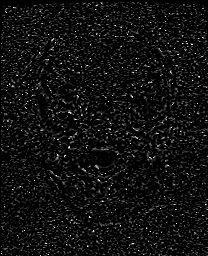
[im 19/57]
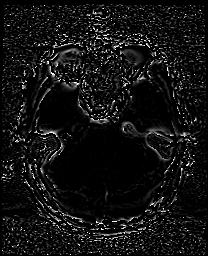
[im 38/57]
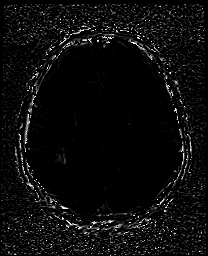
[im 57/57]
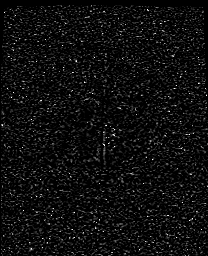

[Series 18: swi_images · axial · 3.0mm · 0.90mm/px · z∈[-67,+108]mm · 5 of 60 slices shown]
[im 1/60]
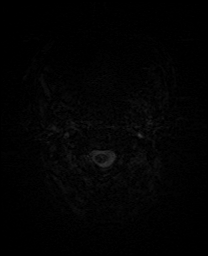
[im 15/60]
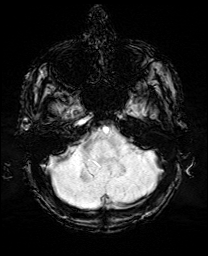
[im 30/60]
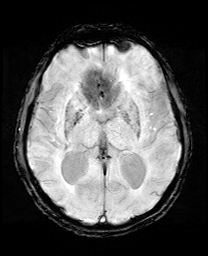
[im 45/60]
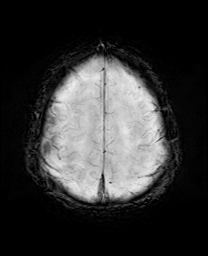
[im 60/60]
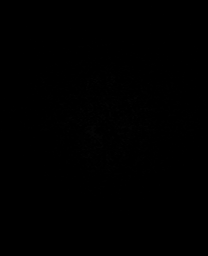

[Series 19: mip_images(sw) · axial · 24.0mm · 0.90mm/px · z∈[-56,+98]mm · 4 of 53 slices shown]
[im 1/53]
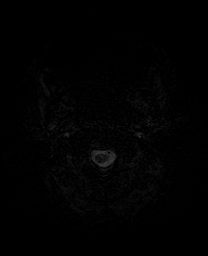
[im 18/53]
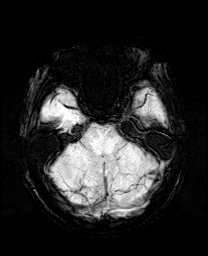
[im 35/53]
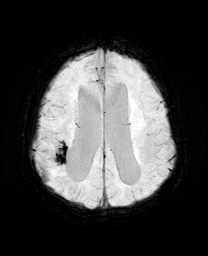
[im 53/53]
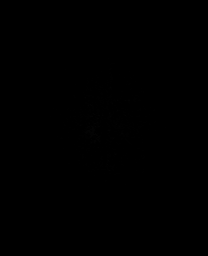

[Series 21: T2 · coronal · 5.0mm · 0.34mm/px · 2 of 29 slices shown (2 of 2)]
[im 1/29]
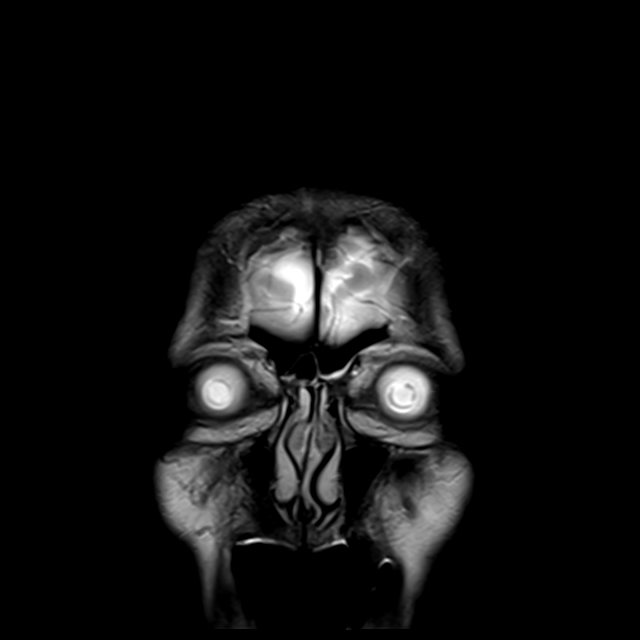
[im 29/29]
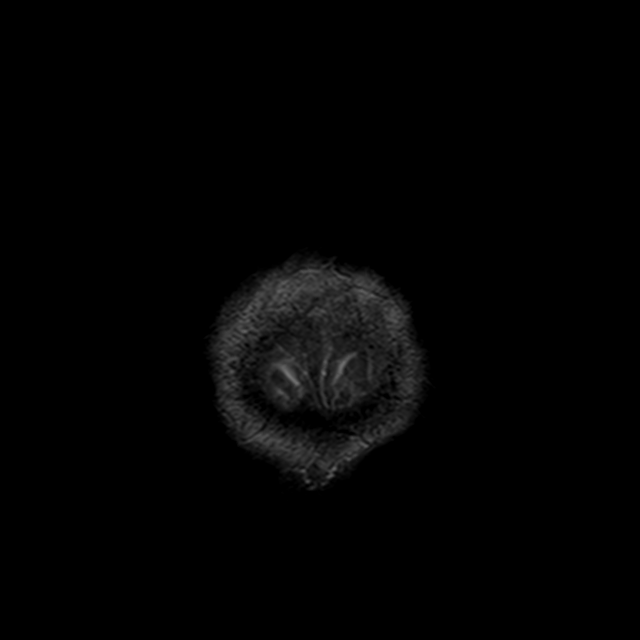

[44 of 48 positions shown; findings below may reference images not displayed]

FINDINGS: Brain: Generalized age-related cerebral atrophy with moderately
advanced chronic microvascular ischemic disease. Remote hemorrhagic
infarct present at the posterior right corona radiata. Additional
small remote cortical infarct at the right occipital lobe with
associated chronic hemosiderin staining. Remote left paramedian
pontine infarct. Additional small remote lacunar infarct at the
right caudate.

r 9 mm focus of diffusion abnormality involving the posterior left
frontal corona radiata (series 9, image 79). Associated T2/FLAIR
signal intensity without definite ADC correlate. Finding likely
reflects a small evolving subacute ischemic infarct. No associated
hemorrhage or mass effect.

No other evidence for acute or subacute ischemia. Gray-white matter
differentiation otherwise maintained. No acute intracranial
hemorrhage.

No mass lesion, midline shift or mass effect. Ventricular prominence
related global parenchymal volume loss of hydrocephalus. No
extra-axial fluid collection. Pituitary gland suprasellar region
within normal limits. Midline structures intact.

Vascular: Major intracranial vascular flow voids are maintained.

Skull and upper cervical spine: Basilar junction normal. Bone marrow
signal intensity within normal limits. No scalp soft tissue
abnormality.

Sinuses/Orbits: Prior bilateral ocular lens replacement. Small left
maxillary sinus retention cyst. Scattered mucosal thickening within
the ethmoidal air cells. No mastoid effusion.

Other: None.
IMPRESSION: 1. 9 mm focus of diffusion abnormality involving the posterior left
frontal corona radiata, likely reflecting a small evolving subacute
ischemic infarct. No associated hemorrhage or mass effect.
2. No other acute intracranial abnormality.
3. Age-related cerebral atrophy with moderately advanced chronic
microvascular ischemic disease, with multiple additional remote
infarcts as above.

## 2021-12-30 IMAGING — CT CT ANGIO HEAD-NECK (W OR W/O PERF)
2 of 7 series · 8 of 33 positions shown · IV contrast (APPLIED)
Comparison: Brain MRI from earlier today. CTA of the head neck
[DATE]

CLINICAL DATA: Stroke follow-up.  Diffuse weakness

EXAM:
CT ANGIOGRAPHY HEAD AND NECK
TECHNIQUE: Multidetector CT imaging of the head and neck was performed using
the standard protocol during bolus administration of intravenous
contrast. Multiplanar CT image reconstructions and MIPs were
obtained to evaluate the vascular anatomy. Carotid stenosis
measurements (when applicable) are obtained utilizing NASCET
criteria, using the distal internal carotid diameter as the
denominator.

[Series 5: cta neck/head · axial · 0.62mm/px · z∈[-189,-67]mm · 2 of 185 slices shown]
[im 62/185  soft-tissue]
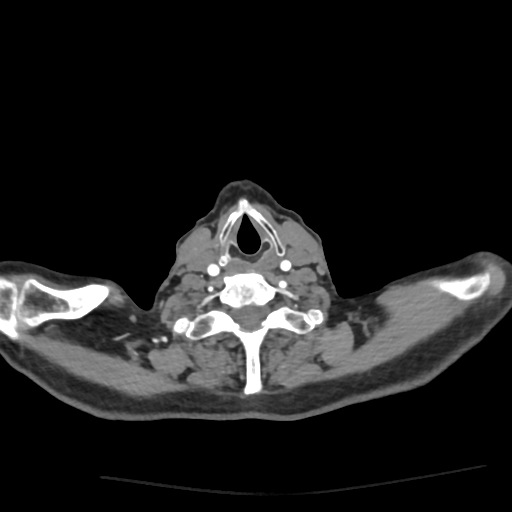
[im 123/185  soft-tissue]
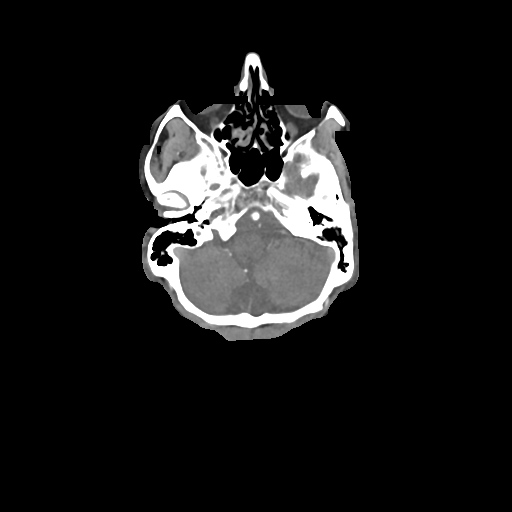

[Series 7: ax thins · axial · 0.60mm/px · z∈[-263,-3]mm · 6 of 364 slices shown]
[im 52/364  soft-tissue]
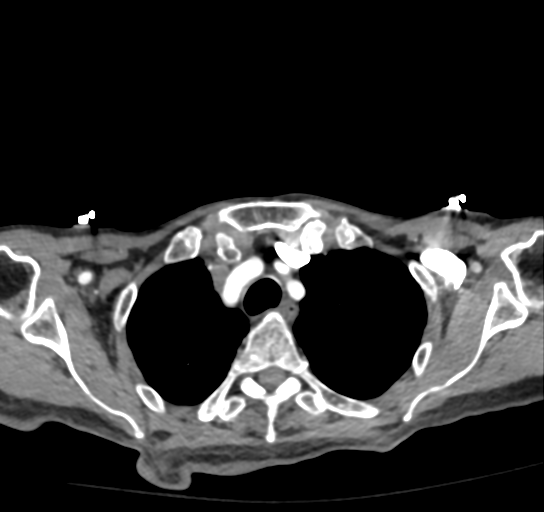
[im 104/364  bone]
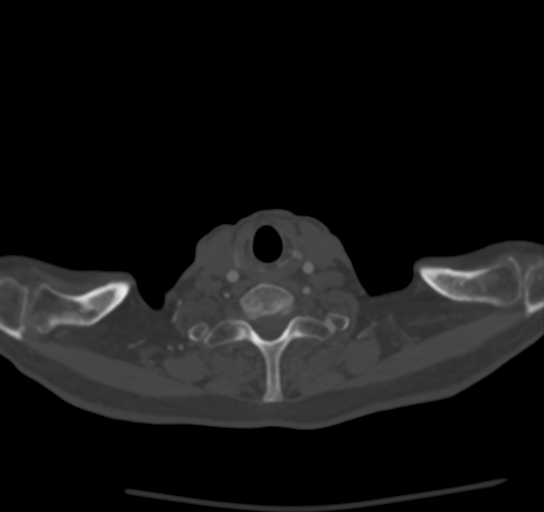
[im 156/364  soft-tissue]
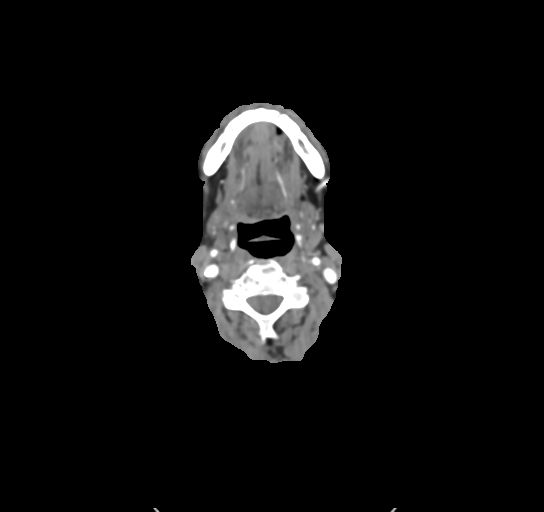
[im 208/364  bone]
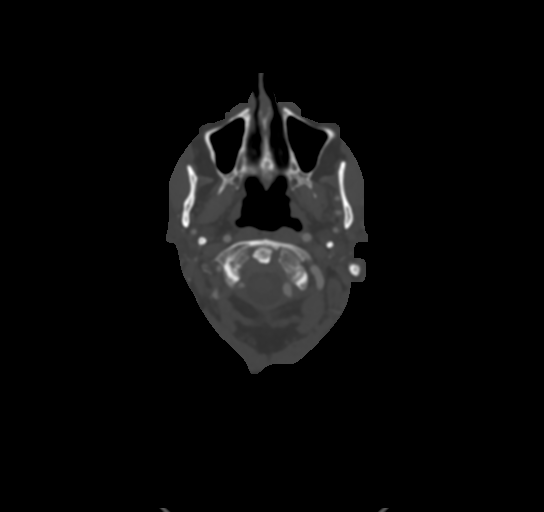
[im 260/364  soft-tissue]
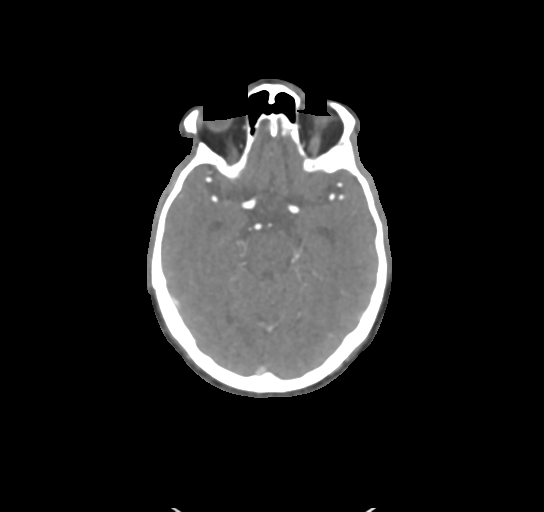
[im 312/364  bone]
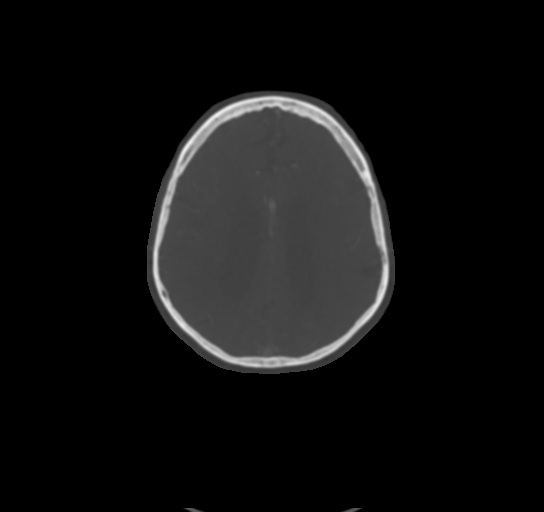

[8 of 33 positions shown; findings below may reference images not displayed]

RADIATION DOSE REDUCTION: This exam was performed according to the
departmental dose-optimization program which includes automated
exposure control, adjustment of the mA and/or kV according to
patient size and/or use of iterative reconstruction technique.

CONTRAST:  75mL OMNIPAQUE IOHEXOL 350 MG/ML SOLN
FINDINGS: CTA NECK FINDINGS

Aortic arch: Atheromatous plaque with 3 vessel branching. No acute
finding or dilatation.

Right carotid system: Moderate to bulky calcified plaque at the
bifurcation. No flow limiting stenosis or ulceration.

Left carotid system: Mild to moderate calcified plaque at the
bifurcation. No flow limiting stenosis or ulceration.

Vertebral arteries: Proximal subclavian atherosclerosis. The left
vertebral artery is dominant. Calcified plaque at both vertebral
origins with 30% narrowing measured on the left by coronal
reformats. Otherwise widely patent and smoothly contoured vertebral
arteries.

Skeleton: Ordinary cervical spine degeneration.

Other neck: No acute finding

Upper chest: No acute finding

Review of the MIP images confirms the above findings

CTA HEAD FINDINGS

Anterior circulation: Calcified plaque along the carotid siphons
with mild bilateral paraclinoid stenosis. No branch occlusion,
beading, or aneurysm. No proximal flow limiting stenosis.

Posterior circulation: Calcified plaque on the dominant left V4
segment without significant stenosis. The vertebral and basilar
arteries are smoothly contoured and diffusely patent.

Effective occlusion of the proximal right PCA but with downstream
reconstitution faintly filling cortical branches.

Venous sinuses: Unremarkable

Anatomic variants: Unremarkable

Review of the MIP images confirms the above findings
IMPRESSION: 1. No emergent finding.
2. Atherosclerosis in the neck without flow limiting stenosis or
ulceration.
3. Chronic proximal right PCA occlusion with faint downstream
filling.

## 2021-12-30 MED ORDER — PROSIGHT PO TABS
1.0000 | ORAL_TABLET | Freq: Every day | ORAL | Status: DC
  Administered 2021-12-30 – 2021-12-31 (×2): 1 via ORAL
  Filled 2021-12-30 (×2): qty 1

## 2021-12-30 MED ORDER — STROKE: EARLY STAGES OF RECOVERY BOOK: Freq: Once | Status: DC

## 2021-12-30 MED ORDER — ACETAMINOPHEN 325 MG PO TABS: 650.0000 mg | ORAL_TABLET | ORAL | Status: DC | PRN

## 2021-12-30 MED ORDER — MIRTAZAPINE 15 MG PO TABS
15.0000 mg | ORAL_TABLET | Freq: Every day | ORAL | Status: DC
  Administered 2021-12-30: 15 mg via ORAL
  Filled 2021-12-30 (×3): qty 1

## 2021-12-30 MED ORDER — CYCLOSPORINE 0.05 % OP EMUL
1.0000 [drp] | Freq: Two times a day (BID) | OPHTHALMIC | Status: DC
  Administered 2021-12-30 – 2021-12-31 (×3): 1 [drp] via OPHTHALMIC
  Filled 2021-12-30 (×5): qty 30

## 2021-12-30 MED ORDER — ACETAMINOPHEN 650 MG RE SUPP: 650.0000 mg | RECTAL | Status: DC | PRN

## 2021-12-30 MED ORDER — ROSUVASTATIN CALCIUM 20 MG PO TABS
20.0000 mg | ORAL_TABLET | Freq: Every day | ORAL | Status: DC
  Administered 2021-12-30 – 2021-12-31 (×2): 20 mg via ORAL
  Filled 2021-12-30 (×2): qty 1

## 2021-12-30 MED ORDER — ASPIRIN EC 81 MG PO TBEC
81.0000 mg | DELAYED_RELEASE_TABLET | Freq: Every day | ORAL | Status: DC
  Administered 2021-12-30 – 2021-12-31 (×2): 81 mg via ORAL
  Filled 2021-12-30 (×2): qty 1

## 2021-12-30 MED ORDER — SERTRALINE HCL 50 MG PO TABS
25.0000 mg | ORAL_TABLET | Freq: Every day | ORAL | Status: DC
  Administered 2021-12-30: 25 mg via ORAL
  Filled 2021-12-30: qty 1

## 2021-12-30 MED ORDER — LATANOPROST 0.005 % OP SOLN
1.0000 [drp] | Freq: Every day | OPHTHALMIC | Status: DC
Start: 2021-12-30 — End: 2021-12-31
  Filled 2021-12-30: qty 2.5

## 2021-12-30 MED ORDER — IOHEXOL 350 MG/ML SOLN
75.0000 mL | Freq: Once | INTRAVENOUS | Status: AC | PRN
  Administered 2021-12-30: 75 mL via INTRAVENOUS

## 2021-12-30 MED ORDER — ASPIRIN EC 81 MG PO TBEC: 81.0000 mg | DELAYED_RELEASE_TABLET | Freq: Every day | ORAL | Status: DC

## 2021-12-30 MED ORDER — ENOXAPARIN SODIUM 40 MG/0.4ML IJ SOSY
40.0000 mg | PREFILLED_SYRINGE | INTRAMUSCULAR | Status: DC
  Administered 2021-12-30 – 2021-12-31 (×2): 40 mg via SUBCUTANEOUS
  Filled 2021-12-30 (×2): qty 0.4

## 2021-12-30 MED ORDER — ACETAMINOPHEN 160 MG/5ML PO SOLN: 650.0000 mg | ORAL | Status: DC | PRN

## 2021-12-30 MED ORDER — PANTOPRAZOLE SODIUM 40 MG PO TBEC
40.0000 mg | DELAYED_RELEASE_TABLET | Freq: Every day | ORAL | Status: DC
  Administered 2021-12-30 – 2021-12-31 (×2): 40 mg via ORAL
  Filled 2021-12-30 (×2): qty 1

## 2021-12-30 MED ORDER — SIMVASTATIN 20 MG PO TABS
40.0000 mg | ORAL_TABLET | Freq: Every day | ORAL | Status: DC
Start: 2021-12-30 — End: 2021-12-30

## 2021-12-30 MED ORDER — CLOPIDOGREL BISULFATE 75 MG PO TABS
75.0000 mg | ORAL_TABLET | Freq: Every day | ORAL | Status: DC
  Administered 2021-12-30: 75 mg via ORAL
  Filled 2021-12-30: qty 1

## 2021-12-30 MED ORDER — OMEGA-3-ACID ETHYL ESTERS 1 G PO CAPS
1.0000 g | ORAL_CAPSULE | Freq: Two times a day (BID) | ORAL | Status: DC
  Administered 2021-12-30 – 2021-12-31 (×3): 1 g via ORAL
  Filled 2021-12-30 (×6): qty 1

## 2021-12-30 MED ORDER — TICAGRELOR 90 MG PO TABS
90.0000 mg | ORAL_TABLET | Freq: Two times a day (BID) | ORAL | Status: DC
  Administered 2021-12-31: 90 mg via ORAL
  Filled 2021-12-30: qty 1

## 2021-12-30 MED ORDER — OCUVITE-LUTEIN PO CAPS: 1.0000 | ORAL_CAPSULE | Freq: Every day | ORAL | Status: DC

## 2021-12-30 NOTE — ED Notes (Signed)
Full linen change performed. Pt able to stand at bedside with assistance  ?

## 2021-12-30 NOTE — ED Notes (Signed)
RN assisted pt with meal tray  ?

## 2021-12-30 NOTE — ED Notes (Signed)
Patient transported to CT 

## 2021-12-30 NOTE — Evaluation (Signed)
Physical Therapy Evaluation ?Patient Details ?Name: Clarence Myers ?MRN: [id number] ?DOB: [birth year] ?Today's Date: 12/30/2021 ? ?History of Present Illness ? Pt is an 88 y.o. male admitted 12/29/21 with acute onset diffuse weakness, trouble standing up. Brain MRI with a small R frontal corona radiata subacute infarct; per neuro, suspect stroke is incidental. CTA with chronic proximal R PCA occlusion with faint downstream filling. PMH includes CAD, HLD, R shoulder injury, lower back strain, stroke (09/2021 with chronic R-side deficits). ?  ?Clinical Impression ? Pt presents with an overall decrease in functional mobility secondary to above. PTA, pt reports ambulatory with RW; pt lives with wife who is in poor health, they have near-24/7 support from daily aides and family overnight. Today, pt requiring min-modA for brief bouts of standing activity; notable weakness and posterior lean affecting balance. Pt reports planning for return home, not interested in SNF. Pt would benefit from continued acute PT services to maximize functional mobility and independence prior to d/c with HHPT services if caregivers able to continue providing necessary assist.     ? ?Recommendations for follow up therapy are one component of a multi-disciplinary discharge planning process, led by the attending physician.  Recommendations may be updated based on patient status, additional functional criteria and insurance authorization. ? ?Follow Up Recommendations Home health PT ? ?  ?Assistance Recommended at Discharge Frequent or constant Supervision/Assistance  ?Patient can return home with the following ? A little help with walking and/or transfers;A little help with bathing/dressing/bathroom;Assistance with cooking/housework;Assist for transportation;Help with stairs or ramp for entrance;Direct supervision/assist for medications management;Direct supervision/assist for financial management ? ?  ?Equipment Recommendations Wheelchair  (measurements PT)  ?Recommendations for Other Services ?    ?  ?Functional Status Assessment Patient has had a recent decline in their functional status and demonstrates the ability to make significant improvements in function in a reasonable and predictable amount of time.  ? ?  ?Precautions / Restrictions Precautions ?Precautions: Fall;Other (comment) ?Precaution Comments: urine incontinence (wears Depends at home) ?Restrictions ?Weight Bearing Restrictions: No  ? ?  ? ?Mobility ? Bed Mobility ?Overal bed mobility: Needs Assistance ?Bed Mobility: Supine to Sit ?  ?  ?Supine to sit: Mod assist, HOB elevated ?  ?  ?General bed mobility comments: ModA for scooting hips to EOB on stretcher ?  ? ?Transfers ?Overall transfer level: Needs assistance ?Equipment used: Rolling walker (2 wheels) ?Transfers: Sit to/from Stand, Bed to chair/wheelchair/BSC ?Sit to Stand: Min assist ?  ?Step pivot transfers: Min assist ?  ?  ?  ?General transfer comment: Initial stand from stretcher to RW and pivotal steps to recliner with minA for stability; additional stand from recliner, cues for hand placement, minA for trunk elevation ?  ? ?Ambulation/Gait ?Ambulation/Gait assistance: Min assist ?  ?Assistive device: Rolling walker (2 wheels) ?Gait Pattern/deviations: Step-to pattern, Step-through pattern, Shuffle, Trunk flexed ?  ?  ?Pre-gait activities: steps ~2' forwards/backwards with RW and minA for stability, pt opting to sit back down secondary to fatigue ?  ? ?Stairs ?  ?  ?  ?  ?  ? ?Wheelchair Mobility ?  ? ?Modified Rankin (Stroke Patients Only) ?  ? ?  ? ?Balance Overall balance assessment: Needs assistance ?Sitting-balance support: No upper extremity supported, Feet unsupported ?Sitting balance-Leahy Scale: Fair ?  ?  ?Standing balance support: Reliant on assistive device for balance ?Standing balance-Leahy Scale: Poor ?  ?  ?  ?  ?  ?  ?  ?  ?  ?  ?  ?  ?   ? ? ? ?  Pertinent Vitals/Pain Pain Assessment ?Pain Assessment:  No/denies pain  ? ? ?Home Living Family/patient expects to be discharged to:: Private residence ?Living Arrangements: Spouse/significant other ?Available Help at Discharge: Family;Personal care attendant;Available 24 hours/day ?Type of Home: House ?Home Access: Stairs to enter ?Entrance Stairs-Rails: None ?Entrance Stairs-Number of Steps: 1 ?  ?Home Layout: Two level;Able to live on main level with bedroom/bathroom (does not go upstairs) ?Home Equipment: Shower seat;Cane - single point;Rolling Walker (2 wheels) ?Additional Comments: Wife also unwell. Since admission 2 months prior, have near-24/7 support from family and aides (one aide comes 8:30A-2P, another comes 2P-8:30P, one of their daughters stays overnight)  ?  ?Prior Function Prior Level of Function : Needs assist ?  ?  ?  ?  ?  ?  ?Mobility Comments: Typically ambulatory with RW ?ADLs Comments: Pt reports able to dress and toilet self; aide assists with bathing. Daughters bring meals; son-in-law does grocery shopping ?  ? ? ?Hand Dominance  ? Dominant Hand: Right ? ?  ?Extremity/Trunk Assessment  ? Upper Extremity Assessment ?Upper Extremity Assessment: Generalized weakness ?  ? ?Lower Extremity Assessment ?Lower Extremity Assessment: Generalized weakness ?  ? ?   ?Communication  ?    ?Cognition Arousal/Alertness: Awake/alert ?Behavior During Therapy: [facility] for tasks assessed/performed ?Overall Cognitive Status: No family/caregiver present to determine baseline cognitive functioning ?  ?  ?  ?  ?  ?  ?  ?  ?  ?  ?  ?  ?  ?  ?  ?  ?General Comments: WFL for majority of simple tasks; suspect slowed processing baseline ?  ?  ? ?  ?General Comments General comments (skin integrity, edema, etc.): pt endorses having adequate assist at home; he went to [facility] for short-term rehab after admission 2 months prior, but not interested in this again, wants to return home with continued family/aide assist ? ?  ?Exercises    ? ?Assessment/Plan  ?  ?PT Assessment Patient  needs continued PT services  ?PT Problem List Decreased strength;Decreased activity tolerance;Decreased balance;Decreased mobility;Decreased cognition ? ?   ?  ?PT Treatment Interventions DME instruction;Gait training;Stair training;Functional mobility training;Therapeutic activities;Therapeutic exercise;Balance training;Patient/family education;Wheelchair mobility training   ? ?PT Goals (Current goals can be found in the Care Plan section)  ?Acute Rehab PT Goals ?Patient Stated Goal: return home with continued support from family and aides ?PT Goal Formulation: With patient ?Time For Goal Achievement: 01/13/22 ?Potential to Achieve Goals: Good ? ?  ?Frequency Min 4X/week ?  ? ? ?Co-evaluation   ?  ?  ?  ?  ? ? ?  ?AM-PAC PT "6 Clicks" Mobility  ?Outcome Measure Help needed turning from your back to your side while in a flat bed without using bedrails?: A Lot ?Help needed moving from lying on your back to sitting on the side of a flat bed without using bedrails?: A Lot ?Help needed moving to and from a bed to a chair (including a wheelchair)?: A Little ?Help needed standing up from a chair using your arms (e.g., wheelchair or bedside chair)?: A Little ?Help needed to walk in hospital room?: Total ?Help needed climbing 3-5 steps with a railing? : Total ?6 Click Score: 12 ? ?  ?End of Session Equipment Utilized During Treatment: Gait belt ?Activity Tolerance: Patient tolerated treatment well;Patient limited by fatigue ?Patient left: in chair;with call bell/phone within reach ?Nurse Communication: Mobility status ?PT Visit Diagnosis: Other abnormalities of gait and mobility (R26.89);Muscle weakness (generalized) (M62.81) ?  ? ?Time:  1749-4496 ?PT Time Calculation (min) (ACUTE ONLY): 19 min ? ? ?Charges:   PT Evaluation ?$PT Eval Moderate Complexity: 1 Mod ?  ?  ?   ?Mabeline Caras, PT, DPT ?Acute Rehabilitation Services  ?Pager [phone] ?Office [phone] ? ?Derry Lory ?12/30/2021, 11:53 AM ? ?

## 2021-12-30 NOTE — Assessment & Plan Note (Addendum)
Diffuse generalized weakness onset this AM. ?MRI confirms acute ischemic stroke. ?1. Stroke pathway ?2. Neuro consulted: ?1. Think that stroke today is incidental, pt with bilateral symptoms not just R sided as would be expected from this stroke. ?2. Get CTA head and neck ?3. No need for 2d echo repeat ?3. Tele monitor ?4. Check lipid profile ?5. A1C ?6. PT/OT/SLP ?7. unfortunately just had strokes in Feb as well ?8. ? Need for loop recorder? ?1. Neuro thinks stroke is just small vessel though ?9. Stroke occurs despite Plavix 75. ?1. ASA 81 for 21 days + Plavix 75 for now till stroke team says otherwise. ?

## 2021-12-30 NOTE — Progress Notes (Signed)
? ?[facility] ?PROGRESS NOTE ? ? ?Clarence Myers OXB:[id number] DOB: 1934/07/27 DOA: 12/29/2021 ? ?PCP: Mayra Neer, MD ? ?Brief History/Interval Summary: 88 y.o. male with medical history significant of HLD, prior stroke(s) in Feb of this year with R sided weakness and dysphagia at that time.  Presented with diffuse weakness.  No focal deficits were noted.  MRI however showed subacute infarct.  Patient was hospitalized for further management. ? ?Consultants: Neurology ? ?Procedures: None ? ? ? ?Subjective/Interval History: ?Patient denies any chest pain or shortness of breath this morning.  Denies any weakness on the right side more than usual.  Has not really been out of the bed yet to see if he is still weak or not. ? ? ? ?Assessment/Plan: ? ?Acute ischemic stroke ?Patient with recent stroke in February.  MRI showed an acute infarct.  Neurology is consulted.  Patient still with subtle right arm weakness from his previous stroke. ?No need to repeat echocardiogram since 1 was done recently. ?No significant stenosis noted on CT angiogram. ?LDL is 97.  Statin dose has been increased. ?HbA1c 5.8 in February. ?Patient was on Plavix prior to admission.  Looks like plan is for aspirin and Plavix for 21 days followed by aspirin alone. ?Stroke neurology to evaluate patient. ?PT OT evaluation is pending.  Speech therapy evaluation is pending. ? ?Essential hypertension ?Permissive hypertension being allowed.  Monitor blood pressures closely. ? ?Hyperlipidemia ?Statin dose has been increased. ? ?DVT Prophylaxis: Lovenox ?Code Status: Full code ?Family Communication: Discussed with patient ?Disposition Plan: To be determined ? ?Status is: Observation ?The patient will require care spanning > 2 midnights and should be moved to inpatient because: Work-up for acute stroke ? ? ? ?Medications: Scheduled: ?  stroke: early stages of recovery book   Does not apply Once  ? aspirin EC  81 mg Oral Daily  ? clopidogrel  75 mg  Oral Daily  ? cycloSPORINE  1 drop Both Eyes BID  ? enoxaparin (LOVENOX) injection  40 mg Subcutaneous Q24H  ? latanoprost  1 drop Both Eyes QHS  ? mirtazapine  15 mg Oral QHS  ? multivitamin-lutein  1 capsule Oral Daily  ? omega-3 acid ethyl esters  1 g Oral BID  ? pantoprazole  40 mg Oral Daily  ? rosuvastatin  20 mg Oral Daily  ? sertraline  25 mg Oral QHS  ? ?Continuous: ? lactated ringers 75 mL/hr at 12/30/21 0410  ? ?AST:[id number] **OR** acetaminophen (TYLENOL) oral liquid 160 mg/5 mL **OR** acetaminophen ? ?Antibiotics: ?Anti-infectives (From admission, onward)  ? ? None  ? ?  ? ? ?Objective: ? ?Vital Signs ? ?Vitals:  ? 12/30/21 0100 12/30/21 0300 12/30/21 0445 12/30/21 0830  ?BP: 138/71 131/74 (!) 144/83 (!) 145/78  ?Pulse: 78 71 80 74  ?Resp: 20 10 (!) 26 (!) 22  ?Temp:      ?TempSrc:      ?SpO2: 96% 95% 96% 95%  ?Weight:      ?Height:      ? ?No intake or output data in the 24 hours ending 12/30/21 1145 ?Filed Weights  ? 12/29/21 1902  ?Weight: 52.6 kg  ? ? ?General appearance: Awake alert.  In no distress ?Resp: Clear to auscultation bilaterally.  Normal effort ?Cardio: S1-S2 is normal regular.  No S3-S4.  No rubs murmurs or bruit ?GI: Abdomen is soft.  Nontender nondistended.  Bowel sounds are present normal.  No masses organomegaly ?Extremities: No edema.  Full range of motion of lower extremities. ?  Neurologic: Alert and oriented x3.  Subtle right arm weakness noted. ? ? ?Lab Results: ? ?Data Reviewed: I have personally reviewed following labs and reports of the imaging studies ? ?CBC: ?Recent Labs  ?Lab 12/29/21 ?2025  ?WBC 8.8  ?NEUTROABS 4.4  ?HGB 11.5*  ?HCT 34.9*  ?MCV 91.6  ?PLT 271  ? ? ?Basic Metabolic Panel: ?Recent Labs  ?Lab 12/29/21 ?2025  ?NA 139  ?K 3.8  ?CL 106  ?CO2 25  ?GLUCOSE 112*  ?BUN 30*  ?CREATININE 1.01  ?CALCIUM 8.9  ? ? ?GFR: ?Estimated Creatinine Clearance: 39.1 mL/min (by C-G formula based on SCr of 1.01 mg/dL). ? ?Liver Function Tests: ?Recent Labs  ?Lab 12/29/21 ?2025   ?AST 24  ?ALT 14  ?ALKPHOS 68  ?BILITOT 0.3  ?PROT 6.6  ?ALBUMIN 3.1*  ? ? ? ?CBG: ?Recent Labs  ?Lab 12/30/21 ?0806  ?GLUCAP 97  ? ? ?Lipid Profile: ?Recent Labs  ?  12/30/21 ?0431  ?CHOL 153  ?HDL 50  ?[place] 97  ?TRIG 32  ?CHOLHDL 3.1  ? ? ? ?Recent Results (from the past 240 hour(s))  ?Resp Panel by RT-PCR (Flu A&B, Covid) Nasopharyngeal Swab     Status: None  ? Collection Time: 12/29/21  7:29 PM  ? Specimen: Nasopharyngeal Swab; Nasopharyngeal(NP) swabs in vial transport medium  ?Result Value Ref Range Status  ? SARS Coronavirus 2 by RT PCR NEGATIVE NEGATIVE Final  ?  Comment: (NOTE) ?SARS-CoV-2 target nucleic acids are NOT DETECTED. ? ?The SARS-CoV-2 RNA is generally detectable in upper respiratory ?specimens during the acute phase of infection. The lowest ?concentration of SARS-CoV-2 viral copies this assay can detect is ?138 copies/mL. A negative result does not preclude SARS-Cov-2 ?infection and should not be used as the sole basis for treatment or ?other patient management decisions. A negative result may occur with  ?improper specimen collection/handling, submission of specimen other ?than nasopharyngeal swab, presence of viral mutation(s) within the ?areas targeted by this assay, and inadequate number of viral ?copies(<138 copies/mL). A negative result must be combined with ?clinical observations, patient history, and epidemiological ?information. The expected result is Negative. ? ?Fact Sheet for Patients:  ?EntrepreneurPulse.com.au ? ?Fact Sheet for Healthcare Providers:  ?IncredibleEmployment.be ? ?This test is no t yet approved or cleared by the [place] FDA and  ?has been authorized for detection and/or diagnosis of SARS-CoV-2 by ?FDA under an Emergency Use Authorization (EUA). This EUA will remain  ?in effect (meaning this test can be used) for the duration of the ?COVID-19 declaration under Section 564(b)(1) of the Act, 21 ?U.S.C.section 360bbb-3(b)(1),  unless the authorization is terminated  ?or revoked sooner.  ? ? ?  ? Influenza A by PCR NEGATIVE NEGATIVE Final  ? Influenza B by PCR NEGATIVE NEGATIVE Final  ?  Comment: (NOTE) ?The Xpert Xpress SARS-CoV-2/FLU/RSV plus assay is intended as an aid ?in the diagnosis of influenza from Nasopharyngeal swab specimens and ?should not be used as a sole basis for treatment. Nasal washings and ?aspirates are unacceptable for Xpert Xpress SARS-CoV-2/FLU/RSV ?testing. ? ?Fact Sheet for Patients: ?EntrepreneurPulse.com.au ? ?Fact Sheet for Healthcare Providers: ?IncredibleEmployment.be ? ?This test is not yet approved or cleared by the [place] FDA and ?has been authorized for detection and/or diagnosis of SARS-CoV-2 by ?FDA under an Emergency Use Authorization (EUA). This EUA will remain ?in effect (meaning this test can be used) for the duration of the ?COVID-19 declaration under Section 564(b)(1) of the Act, 21 U.S.C. ?section 360bbb-3(b)(1), unless the authorization is terminated  or ?revoked. ? ?Performed at [facility], [place] [address], [place], [place] ?[address] ?  ?  ? ? ?Radiology Studies: ?CT ANGIO HEAD NECK W WO CM ? ?Result Date: 12/30/2021 ?CLINICAL DATA:  Stroke follow-up.  Diffuse weakness EXAM: CT ANGIOGRAPHY HEAD AND NECK TECHNIQUE: Multidetector CT imaging of the head and neck was performed using the standard protocol during bolus administration of intravenous contrast. Multiplanar CT image reconstructions and MIPs were obtained to evaluate the vascular anatomy. Carotid stenosis measurements (when applicable) are obtained utilizing NASCET criteria, using the distal internal carotid diameter as the denominator. RADIATION DOSE REDUCTION: This exam was performed according to the departmental dose-optimization program which includes automated exposure control, adjustment of the mA and/or kV according to patient size and/or use of iterative reconstruction technique.  CONTRAST:  46m OMNIPAQUE IOHEXOL 350 MG/ML SOLN COMPARISON:  Brain MRI from earlier today. CTA of the head neck 09/30/2021 FINDINGS: CTA NECK FINDINGS Aortic arch: Atheromatous plaque with 3 vessel branc

## 2021-12-30 NOTE — Evaluation (Signed)
Occupational Therapy Evaluation Patient Details Name: Clarence Myers MRN: [id number] DOB: 1934/05/24 Today's Date: 12/30/2021   History of Present Illness Pt is an 88 y.o. male admitted 12/29/21 with acute onset diffuse weakness, trouble standing up. Brain MRI with a small R frontal corona radiata subacute infarct; per neuro, suspect stroke is incidental. CTA with chronic proximal R PCA occlusion with faint downstream filling. PMH includes CAD, HLD, R shoulder injury, lower back strain, stroke (09/2021 with chronic R-side deficits).   Clinical Impression   Pt admitted for concerns listed above. PTA pt reported that he was ambulating with supervision and able to complete BADL's with no assist. He required assist for medication management, bathing, and meal prep. Pt has 12 hours of aides a day and then 1 of his children stay with him and his wife during the night. At this time, pt is requiring increased assist for all ADL's and Functional mobility. In standing pt pushes posteriorly, requiring mod A for stability. Additionally, pt is requiring mod-max A for all BADL's due to weakness and balance deficits. Recommending HHOT as long as family and aides can continue to assist 24/7. OT will follow acutely.      Recommendations for follow up therapy are one component of a multi-disciplinary discharge planning process, led by the attending physician.  Recommendations may be updated based on patient status, additional functional criteria and insurance authorization.   Follow Up Recommendations  Home health OT    Assistance Recommended at Discharge Frequent or constant Supervision/Assistance  Patient can return home with the following A lot of help with walking and/or transfers;A lot of help with bathing/dressing/bathroom;Assistance with cooking/housework;Direct supervision/assist for medications management;Direct supervision/assist for financial management;Help with stairs or ramp for entrance     Functional Status Assessment  Patient has had a recent decline in their functional status and demonstrates the ability to make significant improvements in function in a reasonable and predictable amount of time.  Equipment Recommendations  None recommended by OT    Recommendations for Other Services       Precautions / Restrictions Precautions Precautions: Fall;Other (comment) Precaution Comments: urine incontinence (wears Depends at home) Restrictions Weight Bearing Restrictions: No      Mobility Bed Mobility Overal bed mobility: Needs Assistance Bed Mobility: Supine to Sit, Sit to Supine     Supine to sit: Mod assist, HOB elevated Sit to supine: Mod assist, HOB elevated   General bed mobility comments: Mod A to come to sitting and to return BLE to bed    Transfers Overall transfer level: Needs assistance Equipment used: Rolling walker (2 wheels) Transfers: Sit to/from Stand Sit to Stand: Mod assist           General transfer comment: Mod A to Power up to standing and maintain balance due to posterior lean      Balance Overall balance assessment: Needs assistance Sitting-balance support: No upper extremity supported, Feet supported Sitting balance-Leahy Scale: Fair   Postural control: Posterior lean Standing balance support: Reliant on assistive device for balance Standing balance-Leahy Scale: Poor Standing balance comment: Pt pushing/leaning postioerly in stanidng.                           ADL either performed or assessed with clinical judgement   ADL Overall ADL's : Needs assistance/impaired Eating/Feeding: Set up;Sitting   Grooming: Set up;Sitting   Upper Body Bathing: Minimal assistance;Sitting   Lower Body Bathing: Maximal assistance;Sitting/lateral leans;Sit to/from stand  Upper Body Dressing : Minimal assistance;Sitting   Lower Body Dressing: Maximal assistance;Sitting/lateral leans;Sit to/from stand   Toilet Transfer: Moderate  assistance;Stand-pivot   Toileting- Clothing Manipulation and Hygiene: Moderate assistance;Sitting/lateral lean;Sit to/from stand       Functional mobility during ADLs: Moderate assistance;Rolling walker (2 wheels) General ADL Comments: Pt presents with increased weaknes, balance deficits. Requiring increased assist.     Vision Baseline Vision/History: 0 No visual deficits Ability to See in Adequate Light: 0 Adequate Patient Visual Report: No change from baseline Vision Assessment?: No apparent visual deficits     Perception     Praxis      Pertinent Vitals/Pain Pain Assessment Pain Assessment: No/denies pain     Hand Dominance Right   Extremity/Trunk Assessment Upper Extremity Assessment Upper Extremity Assessment: Generalized weakness   Lower Extremity Assessment Lower Extremity Assessment: Defer to PT evaluation   Cervical / Trunk Assessment Cervical / Trunk Assessment: Kyphotic   Communication     Cognition Arousal/Alertness: Awake/alert Behavior During Therapy: WFL for tasks assessed/performed Overall Cognitive Status: No family/caregiver present to determine baseline cognitive functioning                                 General Comments: WFL for majority of simple tasks; suspect slowed processing baseline     General Comments  VSS on RA    Exercises     Shoulder Instructions      Home Living Family/patient expects to be discharged to:: Private residence Living Arrangements: Spouse/significant other Available Help at Discharge: Family;Personal care attendant;Available 24 hours/day Type of Home: House Home Access: Stairs to enter [employer] of Steps: 1 Entrance Stairs-Rails: None Home Layout: Two level;Able to live on main level with bedroom/bathroom     Bathroom Shower/Tub: Walk-in shower;Tub/shower unit   Bathroom Toilet: Handicapped height Bathroom Accessibility: Yes How Accessible: Accessible via walker Home  Equipment: Shower seat;Cane - single point;Rolling Walker (2 wheels)   Additional Comments: Wife also unwell. Since admission 2 months prior, have near-24/7 support from family and aides (one aide comes 8:30A-2P, another comes 2P-8:30P, one of their daughters stays overnight)  Lives With: Spouse    Prior Functioning/Environment Prior Level of Function : Needs assist             Mobility Comments: Typically ambulatory with RW ADLs Comments: Pt reports able to dress and toilet self; aide assists with bathing. Daughters bring meals; son-in-law does grocery shopping        OT Problem List: Decreased strength;Decreased activity tolerance;Impaired balance (sitting and/or standing);Decreased cognition;Decreased safety awareness;Impaired UE functional use      OT Treatment/Interventions: Self-care/ADL training;Therapeutic exercise;Energy conservation;DME and/or AE instruction;Therapeutic activities;Patient/family education;Balance training    OT Goals(Current goals can be found in the care plan section) Acute Rehab OT Goals Patient Stated Goal: To get stronger OT Goal Formulation: With patient Time For Goal Achievement: 01/13/22 Potential to Achieve Goals: Good ADL Goals Pt Will Perform Grooming: with modified independence;sitting Pt Will Perform Lower Body Bathing: with min assist;sitting/lateral leans;sit to/from stand Pt Will Perform Lower Body Dressing: with min assist;sitting/lateral leans;sit to/from stand Pt Will Transfer to Toilet: with min guard assist;ambulating Pt Will Perform Toileting - Clothing Manipulation and hygiene: with min guard assist;sitting/lateral leans;sit to/from stand  OT Frequency: Min 2X/week    Co-evaluation              AM-PAC OT "6 Clicks" Daily Activity     Outcome  Measure Help from another person eating meals?: A Little Help from another person taking care of personal grooming?: A Little Help from another person toileting, which includes using  toliet, bedpan, or urinal?: A Lot Help from another person bathing (including washing, rinsing, drying)?: A Lot Help from another person to put on and taking off regular upper body clothing?: A Little Help from another person to put on and taking off regular lower body clothing?: A Lot 6 Click Score: 15   End of Session Equipment Utilized During Treatment: Gait belt;Rolling walker (2 wheels) Nurse Communication: Mobility status  Activity Tolerance: Patient tolerated treatment well Patient left: in bed;with call bell/phone within reach;Other (comment) (SLP in room)  OT Visit Diagnosis: Unsteadiness on feet (R26.81);Other abnormalities of gait and mobility (R26.89);Muscle weakness (generalized) (M62.81)                Time: 4098-1191 OT Time Calculation (min): 14 min Charges:  OT General Charges $OT Visit: 1 Visit OT Evaluation $OT Eval Moderate Complexity: 1 Mod  Genita Nilsson H., OTR/L Acute Rehabilitation  Arnold Kester Elane Bing Plume 12/30/2021, 5:09 PM

## 2021-12-30 NOTE — H&P (Signed)
?History and Physical  ? ? ?Patient: Clarence Myers FIE:[id number] DOB: 10-Jul-1934 ?DOA: 12/29/2021 ?DOS: the patient was seen and examined on 12/30/2021 ?PCP: Mayra Neer, MD  ?Patient coming from: Home ? ?Chief Complaint:  ?Chief Complaint  ?Patient presents with  ? generalized weakness  ? ?HPI: Clarence Myers is a 88 y.o. male with medical history significant of HLD, prior stroke(s) in Feb of this year with R sided weakness and dysphagia at that time. ? ?Pt presents to ED with c/o acute onset diffuse weakness.  Symptoms onset earlier this AM.  Denies any focal weakness, one side not worse than the other this time around. ? ?No recent headaches, fevers, urinary symptoms, CP, abd pain.   No new meds. ? ?Generalized weakness is severe, having trouble with ambulation. ? ?Review of Systems: As mentioned in the history of present illness. All other systems reviewed and are negative. ?Past Medical History:  ?Diagnosis Date  ? CAD (coronary artery disease)   ? Hyperlipidemia   ? Low back strain   ? Shoulder injury   ? Fell and injured Right shoulder  ? ?Past Surgical History:  ?Procedure Laterality Date  ? ANGIOPLASTY    ? 1992-LAD  ? CARDIAC CATHETERIZATION    ? ?Social History:  reports that he has never smoked. He has never used smokeless tobacco. No history on file for alcohol use and drug use. ? ?Allergies  ?Allergen Reactions  ? [employer]) Allergy Skin Test   ?  "Hives"   ? Other   ?  Bee Stings  ? ? ?No family history on file. ? ?Prior to Admission medications   ?Medication Sig Start Date End Date Taking? Authorizing Provider  ?acetaminophen (TYLENOL) 325 MG tablet Take 2 tablets (650 mg total) by mouth every 4 (four) hours as needed for mild pain (or temp > 37.5 C (99.5 F)). 10/09/21  Yes Cherene Altes, MD  ?clopidogrel (PLAVIX) 75 MG tablet Take 1 tablet (75 mg total) by mouth daily. 10/10/21  Yes Cherene Altes, MD  ?LUMIGAN 0.01 % SOLN Place 1 drop into both eyes at bedtime.  03/04/21  Yes [provider]  ?metoprolol tartrate (LOPRESSOR) 25 MG tablet Take 12.5 mg by mouth 2 (two) times daily. 08/07/21  Yes [provider]  ?mirtazapine (REMERON) 15 MG tablet Take 15 mg by mouth at bedtime. 11/13/21  Yes [provider]  ?Multiple Vitamin (MULTI-VITAMIN PO) Take by mouth daily.     Yes [provider]  ?multivitamin-lutein (OCUVITE-LUTEIN) CAPS Take 1 capsule by mouth daily.     Yes [provider]  ?Omega-3 Fatty Acids (FISH OIL PO) Take 1,000 mg by mouth in the morning and at bedtime.   Yes [provider]  ?omeprazole (PRILOSEC) 20 MG capsule Take 20 mg by mouth daily. 01/21/21  Yes [provider]  ?Probiotic Product (PROBIOTIC DAILY PO) Take 1 capsule by mouth daily.   Yes [provider]  ?RESTASIS 0.05 % ophthalmic emulsion Place 1 drop into both eyes 2 (two) times daily. 10/25/20  Yes [provider]  ?sertraline (ZOLOFT) 25 MG tablet Take 25 mg by mouth at bedtime.   Yes [provider]  ?simvastatin (ZOCOR) 40 MG tablet Take 40 mg by mouth at bedtime.   Yes [provider]  ?aspirin EC 81 MG EC tablet Take 1 tablet (81 mg total) by mouth daily for 21 days. Swallow whole. ?Patient not taking: Reported on 12/30/2021 09/30/21 10/21/21  Joette Catching  T, MD  ? ? ?Physical Exam: ?Vitals:  ? 12/30/21 0015 12/30/21 0030 12/30/21 0100 12/30/21 0300  ?BP: 139/81 (!) 155/69 138/71 131/74  ?Pulse: 79 78 78 71  ?Resp: 17 (!) '22 20 10  '$ ?Temp:      ?TempSrc:      ?SpO2: 97% 94% 96% 95%  ?Weight:      ?Height:      ? ?Constitutional: NAD, calm, comfortable ?Eyes: PERRL, lids and conjunctivae normal ?ENMT: Mucous membranes are moist. Posterior pharynx clear of any exudate or lesions.Normal dentition.  ?Neck: normal, supple, no masses, no thyromegaly ?Respiratory: clear to auscultation bilaterally, no wheezing, no crackles. Normal respiratory effort. No accessory muscle use.  ?Cardiovascular: Regular rate  and rhythm, no murmurs / rubs / gallops. No extremity edema. 2+ pedal pulses. No carotid bruits.  ?Abdomen: no tenderness, no masses palpated. No hepatosplenomegaly. Bowel sounds positive.  ?Musculoskeletal: no clubbing / cyanosis. No joint deformity upper and lower extremities. Good ROM, no contractures. Normal muscle tone.  ?Skin: no rashes, lesions, ulcers. No induration ?Neurologic: Mild R sided weakness on exam. ?Psychiatric: Normal judgment and insight. Alert and oriented x 3. Normal mood.  ? ?Data Reviewed: ? ?Covid and flu neg ?UA neg ?CBC and CMP not very impressive either. ? ?Assessment and Plan: ?* Acute ischemic stroke ([place]) ?Diffuse generalized weakness onset this AM. ?MRI confirms acute ischemic stroke. ?Stroke pathway ?Neuro consulted: ?Think that stroke today is incidental, pt with bilateral symptoms not just R sided as would be expected from this stroke. ?Get CTA head and neck ?No need for 2d echo repeat ?Tele monitor ?Check lipid profile ?A1C ?PT/OT/SLP ?unfortunately just had strokes in Feb as well ?? Need for loop recorder? ?Neuro thinks stroke is just small vessel though ?Stroke occurs despite Plavix 75. ?ASA 81 for 21 days + Plavix 75 for now till stroke team says otherwise. ? ?Essential hypertension ?Allowing permissive HTN in setting of acute / subacute stroke. ? ?Hyperlipidemia ?Cont statin ?Check lipid pnl ? ? ? ? ? Advance Care Planning:   Code Status: Full Code ? ?Consults: Neuro ? ?Family Communication: Family at bedside ? ?Severity of Illness: ?The appropriate patient status for this patient is OBSERVATION. Observation status is judged to be reasonable and necessary in order to provide the required intensity of service to ensure the patient's safety. The patient's presenting symptoms, physical exam findings, and initial radiographic and laboratory data in the context of their medical condition is felt to place them at decreased risk for further clinical deterioration. Furthermore, it is  anticipated that the patient will be medically stable for discharge from the hospital within 2 midnights of admission.  ? ?Author: ?Etta Quill., DO ?12/30/2021 3:42 AM ? ?For on call review [email or web address].  ?

## 2021-12-30 NOTE — Assessment & Plan Note (Signed)
Cont statin ?Check lipid pnl ?

## 2021-12-30 NOTE — Progress Notes (Signed)
STROKE TEAM PROGRESS NOTE  ? ?SUBJECTIVE (INTERVAL HISTORY) ?His RN is at the bedside. Pt lying in bed, stated that he felt better than yesterday. Still has chronic left arm weakness from previous stroke. On tele, pt has frequent PACs. Pt denies heat palpitation, but said he was told by his PCP in the past that he had some heart "fluttering". He stated that he is taking plavix at home.  ? ? ?OBJECTIVE ?Temp:  [97.9 ?F (36.6 ?C)] 97.9 ?F (36.6 ?C) (05/12 1908) ?Pulse Rate:  [40-87] 74 (05/13 0830) ?Resp:  [10-26] 22 (05/13 0830) ?BP: (125-157)/(65-91) 145/78 (05/13 0830) ?SpO2:  [94 %-98 %] 95 % (05/13 0830) ?Weight:  [52.6 kg] 52.6 kg (05/12 1902) ? ?Recent Labs  ?Lab 12/30/21 ?0806  ?GLUCAP 97  ? ?Recent Labs  ?Lab 12/29/21 ?2025  ?NA 139  ?K 3.8  ?CL 106  ?CO2 25  ?GLUCOSE 112*  ?BUN 30*  ?CREATININE 1.01  ?CALCIUM 8.9  ? ?Recent Labs  ?Lab 12/29/21 ?2025  ?AST 24  ?ALT 14  ?ALKPHOS 68  ?BILITOT 0.3  ?PROT 6.6  ?ALBUMIN 3.1*  ? ?Recent Labs  ?Lab 12/29/21 ?2025  ?WBC 8.8  ?NEUTROABS 4.4  ?HGB 11.5*  ?HCT 34.9*  ?MCV 91.6  ?PLT 271  ? ?No results for input(s): CKTOTAL, CKMB, CKMBINDEX, TROPONINI in the last 168 hours. ?No results for input(s): LABPROT, INR in the last 72 hours. ?Recent Labs  ?  12/29/21 ?2002  ?COLORURINE YELLOW  ?LABSPEC 1.009  ?PHURINE 5.0  ?GLUCOSEU NEGATIVE  ?HGBUR NEGATIVE  ?BILIRUBINUR NEGATIVE  ?KETONESUR NEGATIVE  ?PROTEINUR NEGATIVE  ?NITRITE NEGATIVE  ?LEUKOCYTESUR NEGATIVE  ?  ?   ?Component Value Date/Time  ? CHOL 153 12/30/2021 0431  ? TRIG 32 12/30/2021 0431  ? HDL 50 12/30/2021 0431  ? CHOLHDL 3.1 12/30/2021 0431  ? VLDL 6 12/30/2021 0431  ? [place] 97 12/30/2021 0431  ? ?Lab Results  ?Component Value Date  ? HGBA1C 5.8 (H) 09/29/2021  ? ?   ?Component Value Date/Time  ? LABOPIA NONE DETECTED 09/29/2021 1549  ? COCAINSCRNUR NONE DETECTED 09/29/2021 1549  ? LABBENZ NONE DETECTED 09/29/2021 1549  ? AMPHETMU NONE DETECTED 09/29/2021 1549  ? THCU NONE DETECTED 09/29/2021 1549  ? LABBARB  NONE DETECTED 09/29/2021 1549  ?  ?No results for input(s): ETH in the last 168 hours. ? ?I have personally reviewed the radiological images below and agree with the radiology interpretations. ? ?CT ANGIO HEAD NECK W WO CM ? ?Result Date: 12/30/2021 ?CLINICAL DATA:  Stroke follow-up.  Diffuse weakness EXAM: CT ANGIOGRAPHY HEAD AND NECK TECHNIQUE: Multidetector CT imaging of the head and neck was performed using the standard protocol during bolus administration of intravenous contrast. Multiplanar CT image reconstructions and MIPs were obtained to evaluate the vascular anatomy. Carotid stenosis measurements (when applicable) are obtained utilizing NASCET criteria, using the distal internal carotid diameter as the denominator. RADIATION DOSE REDUCTION: This exam was performed according to the departmental dose-optimization program which includes automated exposure control, adjustment of the mA and/or kV according to patient size and/or use of iterative reconstruction technique. CONTRAST:  6m OMNIPAQUE IOHEXOL 350 MG/ML SOLN COMPARISON:  Brain MRI from earlier today. CTA of the head neck 09/30/2021 FINDINGS: CTA NECK FINDINGS Aortic arch: Atheromatous plaque with 3 vessel branching. No acute finding or dilatation. Right carotid system: Moderate to bulky calcified plaque at the bifurcation. No flow limiting stenosis or ulceration. Left carotid system: Mild to moderate calcified plaque at the bifurcation. No flow limiting stenosis  or ulceration. Vertebral arteries: Proximal subclavian atherosclerosis. The left vertebral artery is dominant. Calcified plaque at both vertebral origins with 30% narrowing measured on the left by coronal reformats. Otherwise widely patent and smoothly contoured vertebral arteries. Skeleton: Ordinary cervical spine degeneration. Other neck: No acute finding Upper chest: No acute finding Review of the MIP images confirms the above findings CTA HEAD FINDINGS Anterior circulation: Calcified  plaque along the carotid siphons with mild bilateral paraclinoid stenosis. No branch occlusion, beading, or aneurysm. No proximal flow limiting stenosis. Posterior circulation: Calcified plaque on the dominant left V4 segment without significant stenosis. The vertebral and basilar arteries are smoothly contoured and diffusely patent. Effective occlusion of the proximal right PCA but with downstream reconstitution faintly filling cortical branches. Venous sinuses: Unremarkable Anatomic variants: Unremarkable Review of the MIP images confirms the above findings IMPRESSION: 1. No emergent finding. 2. Atherosclerosis in the neck without flow limiting stenosis or ulceration. 3. 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 Electronically Signed   By: Jorje Guild M.D.   On: 12/30/2021 05:22  ? ?CT Head Wo Contrast ? ?Result Date: 12/29/2021 ?CLINICAL DATA:  Dizziness and weakness, initial encounter EXAM: CT HEAD WITHOUT CONTRAST TECHNIQUE: Contiguous axial images were obtained from the base of the skull through the vertex without intravenous contrast. RADIATION DOSE REDUCTION: This exam was performed according to the departmental dose-optimization program which includes automated exposure control, adjustment of the mA and/or kV according to patient size and/or use of iterative reconstruction technique. COMPARISON:  10/02/2021 FINDINGS: Brain: No evidence of acute infarction, hemorrhage, hydrocephalus, extra-axial collection or mass lesion/mass effect. There are changes consistent with lacunar infarct in the left half of the pons progressed from the prior exam as well as areas of chronic white matter ischemia. Atrophic changes are noted as well. Vascular: No hyperdense vessel or unexpected calcification. Skull: Normal. Negative for fracture or focal lesion. Sinuses/Orbits: No acute finding. Other: None. IMPRESSION: Chronic atrophic and ischemic changes without acute abnormality. Electronically  Signed   By: Inez Catalina M.D.   On: 12/29/2021 20:05  ? ?MR Brain Wo Contrast (neuro protocol) ? ?Result Date: 12/30/2021 ?CLINICAL DATA:  Initial evaluation for neuro deficit, stroke suspected. EXAM: MRI HEAD WITHOUT CONTRAST TECHNIQUE: Multiplanar, multiecho pulse sequences of the brain and surrounding structures were obtained without intravenous contrast. COMPARISON:  Prior CT from 12/29/2021. FINDINGS: Brain: Generalized age-related cerebral atrophy with moderately advanced chronic microvascular ischemic disease. Remote hemorrhagic infarct present at the posterior right corona radiata. Additional small remote cortical infarct at the right occipital lobe with associated chronic hemosiderin staining. Remote left paramedian pontine infarct. Additional small remote lacunar infarct at the right caudate. r 9 mm focus of diffusion abnormality involving the posterior left frontal corona radiata (series 9, image 79). Associated T2/FLAIR signal intensity without definite ADC correlate. Finding likely reflects a small evolving subacute ischemic infarct. No associated hemorrhage or mass effect. No other evidence for acute or subacute ischemia. Gray-white matter differentiation otherwise maintained. No acute intracranial hemorrhage. No mass lesion, midline shift or mass effect. Ventricular prominence related global parenchymal volume loss of hydrocephalus. No extra-axial fluid collection. Pituitary gland suprasellar region within normal limits. Midline structures intact. Vascular: Major intracranial vascular flow voids are maintained. Skull and upper cervical spine: Basilar junction normal. Bone marrow signal intensity within normal limits. No scalp soft tissue abnormality. Sinuses/Orbits: Prior bilateral ocular lens replacement. Small left maxillary sinus retention cyst. Scattered mucosal thickening within the ethmoidal air cells. No mastoid effusion. Other: None. IMPRESSION: 1.  9 mm focus of diffusion abnormality involving  the posterior left frontal corona radiata, likely reflecting a small evolving subacute ischemic infarct. No associated hemorrhage or mass effect. 2. No other acute intracranial abnormality. 3. Age-related cer

## 2021-12-30 NOTE — Consult Note (Signed)
NEUROLOGY CONSULTATION NOTE  ? ?Date of service: Dec 30, 2021 ?Patient Name: Clarence Myers ?MRN:  [id number] ?DOB:  [birth year] ?Reason for consult: "subacute stroke on MRI Brain" ?Requesting Provider: Lacretia Leigh, MD ?_ _ _   _ __   _ __ _ _  __ __   _ __   __ _ ? ?History of Present Illness  ?GARRETH BURNSWORTH is a 88 y.o. 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 ? ?He had workup with MRI Brain without contrast which demonstrated a 9 mm focus of diffusion abnormality involving the posterior left frontal corona radiata, likely reflecting a small evolving subacute ischemic infarct. ? ?Neurology consulted for further evaluation and management. ? ?LKW: 12/29/21 in AM ?mRS: 3 ?tNKASE: not offered ?Thrombectomy: not offered, low suspicion for an LVO ?NIHSS components Score: Comment  ?1a Level of Conscious 0$RemoveBefor'[x]'BQqJOrYZEjzw$  '[]'$  2$R'[]'hg$  '[]'$      ?1b LOC Questions 0$RemoveBefore'[x]'RoxalpJJBEgdm$  '[]'$  2$R'[]'Ks$       ?1c LOC Commands 0$RemoveBefor'[x]'YHdfDNXKxzrb$  '[]'$  2$R'[]'rR$       ?2 Best Gaze 0$RemoveBe'[x]'iniSaoWHc$  '[]'$  2$R'[]'nA$       ?3 Visual 0$RemoveBe'[x]'XthTnZhcq$  '[]'$  2$R'[]'VS$  '[]'$      ?4 Facial Palsy 0$RemoveBefor'[x]'zPhHuyPqzkes$  '[]'$  2$R'[]'WU$  '[]'$      ?5a Motor Arm - left 0$Remov'[x]'KEEhET$  '[]'$  2$R'[]'FO$  '[]'$  4$R'[]'ux$  U'[]'$    ?5b Motor Arm - Right 0$Remove'[x]'iVcvWRp$  '[]'$  2$R'[]'wf$  '[]'$  4$R'[]'Pk$  U'[]'$    ?6a Motor Leg - Left 0$Remov'[x]'DiCbSl$  '[]'$  2$R'[]'bX$  '[]'$  4$R'[]'Cp$  U'[]'$    ?6b Motor Leg - Right 0$Remove'[x]'JMBWtTb$  '[]'$  2$R'[]'ag$  '[]'$  4$R'[]'Ts$  U'[]'$    ?7 Limb Ataxia 0$RemoveBefo'[x]'tifdkdIGcoT$  '[]'$  2$R'[]'IB$  '[]'$  UN$R'[]'ug$     ?8 Sensory 0$RemoveBef'[x]'aUtfmnpYJl$  '[]'$  2$R'[]'Zx$  U'[]'$      ?9 Best Language 0$RemoveBefore'[x]'KeYDwVcfTKmxn$  '[]'$  2$R'[]'qT$  '[]'$      ?10 Dysarthria 0$RemoveBeforeD'[x]'upssonXfyCtFgR$  '[]'$  2$R'[]'Hx$  U'[]'$      ?11 Extinct. and Inattention 0$RemoveBeforeDE'[x]'xWAXksWFMGUQuVE$  '[]'$  2$R'[]'vf$       ?TOTAL: 0   ?  ?ROS  ? ?Constitutional Denies weight loss, fever and chills.   ?HEENT Denies changes in vision and hearing.   ?Respiratory Denies SOB and cough.   ?CV Denies palpitations and CP   ?GI Denies abdominal pain, nausea, vomiting and diarrhea.   ?GU Denies dysuria and urinary frequency.   ?MSK Denies myalgia and joint pain.   ?Skin Denies  rash and pruritus.   ?Neurological Denies headache and syncope.   ?Psychiatric Denies recent changes in mood. Denies anxiety and depression.   ? ?Past History  ? ?Past Medical History:  ?Diagnosis Date  ? CAD (coronary artery disease)   ? Hyperlipidemia   ? Low back strain   ? Shoulder injury   ? Fell and injured Right shoulder  ? ?Past Surgical History:  ?Procedure Laterality Date  ? ANGIOPLASTY    ? 1992-LAD  ? CARDIAC CATHETERIZATION    ? ?No family history on file. ?Social History  ? ?Socioeconomic History  ? Marital status: Married  ?  Spouse name: Not on file  ? Number of children: Not on file  ? Years of education: Not on file  ? Highest education level: Not on file  ?Occupational History  ? Not on file  ?Tobacco Use  ? Smoking status: Never  ? Smokeless tobacco: Never  ?Substance and Sexual Activity  ? Alcohol use: Not on file  ? Drug use: Not on file  ? Sexual activity: Not on  file  ?Other Topics Concern  ? Not on file  ?Social History Narrative  ? Not on file  ? ?Social Determinants of Health  ? ?Financial Resource Strain: Not on file  ?Food Insecurity: Not on file  ?Transportation Needs: Not on file  ?Physical Activity: Not on file  ?Stress: Not on file  ?Social Connections: Not on file  ? ?Allergies  ?Allergen Reactions  ? [employer]) Allergy Skin Test   ?  "Hives"   ? Other   ?  Bee Stings  ? ? ?Medications  ?(Not in a hospital admission) ?  ? ?Vitals  ? ?Vitals:  ? 12/29/21 2345 12/30/21 0000 12/30/21 0015 12/30/21 0030  ?BP: 140/76 (!) 157/81 139/81 (!) 155/69  ?Pulse: 78 75 79 78  ?Resp: (!) 24 19 17  (!) 22  ?Temp:      ?TempSrc:      ?SpO2: 95% 97% 97% 94%  ?Weight:      ?Height:      ?  ? ?Body mass index is 18.72 kg/m?. ? ?Physical Exam  ? ?General: Laying comfortably in bed; in no acute distress.  ?HENT: Normal oropharynx and mucosa. Normal external appearance of ears and nose.  ?Neck: Supple, no pain or tenderness  ?CV: No JVD. No peripheral edema.  ?Pulmonary: Symmetric Chest  rise. Normal respiratory effort.  ?Abdomen: Soft to touch, non-tender.  ?Ext: No cyanosis, edema, or deformity  ?Skin: No rash. Normal palpation of skin.   ?Musculoskeletal: Normal digits and nails by inspection. No clubbing.  ? ?Neurologic Examination  ?Mental status/Cognition: Alert, oriented to self, place, month and year, good attention.  ?Speech/language: Fluent, comprehension intact, object naming intact, repetition intact.  ?Cranial nerves:  ? CN II Pupils equal and reactive to light, no VF deficits   ? CN III,IV,VI EOM intact, no gaze preference or deviation, no nystagmus   ? CN V normal sensation in V1, V2, and V3 segments bilaterally   ? CN VII no asymmetry, no nasolabial fold flattening   ? CN VIII normal hearing to speech   ? CN IX & X normal palatal elevation, no uvular deviation   ? CN XI 5/5 head turn and 5/5 shoulder shrug bilaterally   ? CN XII midline tongue protrusion   ? ?Motor:  ?Muscle bulk: normal, tone normal, pronator drift yes in RUE. tremor none ?Mvmt Root Nerve  Muscle Right Left Comments  ?SA C5/6 Ax Deltoid 5 5   ?EF C5/6 Mc Biceps 5 5   ?EE C6/7/8 Rad Triceps 5 5   ?WF C6/7 Med FCR     ?WE C7/8 PIN ECU     ?F Ab C8/T1 U ADM/FDI 5 5   ?HF L1/2/3 Fem Illopsoas 5 5   ?KE L2/3/4 Fem Quad 5 5   ?DF L4/5 D Peron Tib Ant 5 5   ?PF S1/2 Tibial Grc/Sol 5 5   ? ?Reflexes: ? Right Left Comments  ?Pectoralis     ? Biceps (C5/6) 2 2   ?Brachioradialis (C5/6) 2 2   ? Triceps (C6/7) 2 2   ? Patellar (L3/4) 2 2   ? Achilles (S1)     ? Hoffman     ? Plantar     ?Jaw jerk   ? ?Sensation: ? Light touch Intact throughout  ? Pin prick   ? Temperature   ? Vibration   ?Proprioception   ? ?Coordination/Complex Motor:  ?- Finger to Nose intact with mild incoordination in RUE ?- Heel to  shin with mild incoordination in RLE ?- Rapid alternating movement are slowed in RUE ?- Gait: defrred for patient safety. ? ?Labs  ? ?CBC:  ?Recent Labs  ?Lab 12/29/21 ?2025  ?WBC 8.8  ?NEUTROABS 4.4  ?HGB 11.5*  ?HCT 34.9*   ?MCV 91.6  ?PLT 271  ? ? ?Basic Metabolic Panel:  ?Lab Results  ?Component Value Date  ? NA 139 12/29/2021  ? K 3.8 12/29/2021  ? CO2 25 12/29/2021  ? GLUCOSE 112 (H) 12/29/2021  ? BUN 30 (H) 12/29/2021  ? CREATININE 1.01 12/29/2021  ? CALCIUM 8.9 12/29/2021  ? GFRNONAA >60 12/29/2021  ? GFRAA  09/02/2007  ?  >60        ?The eGFR has been calculated ?using the MDRD equation. ?This calculation has not been ?validated in all clinical ?situations. ?eGFR's persistently ?<60 mL/min signify ?possible Chronic Kidney Disease.  ? ?Lipid Panel:  ?Lab Results  ?Component Value Date  ? [place] 64 09/29/2021  ? ?HgbA1c:  ?Lab Results  ?Component Value Date  ? HGBA1C 5.8 (H) 09/29/2021  ? ?Urine Drug Screen:  ?   ?Component Value Date/Time  ? LABOPIA NONE DETECTED 09/29/2021 1549  ? COCAINSCRNUR NONE DETECTED 09/29/2021 1549  ? LABBENZ NONE DETECTED 09/29/2021 1549  ? AMPHETMU NONE DETECTED 09/29/2021 1549  ? THCU NONE DETECTED 09/29/2021 1549  ? LABBARB NONE DETECTED 09/29/2021 1549  ?  ?Alcohol Level  ?   ?Component Value Date/Time  ? ETH <10 09/29/2021 1534  ? ? ?CT Head without contrast(Personally reviewed): ?CTH was negative for a large hypodensity concerning for a large territory infarct or hyperdensity concerning for an ICH ? ?MRI Brain(Personally reviewed): ?1. 9 mm focus of diffusion abnormality involving the posterior left ?frontal corona radiata, likely reflecting a small evolving subacute ?ischemic infarct. No associated hemorrhage or mass effect. ?2. No other acute intracranial abnormality. ?3. Age-related cerebral atrophy with moderately advanced chronic ?microvascular ischemic disease, with multiple additional remote ?infarcts as above. ? ?Impression  ? ?SARON TWEED is a 88 y.o. 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 MRI with a small R frontal corona radiata subacute infarct. I think the stroke is likely incidental. He reports that his weakness is diffuse and has significantly improved at

## 2021-12-30 NOTE — Assessment & Plan Note (Signed)
Allowing permissive HTN in setting of acute / subacute stroke. ?

## 2021-12-30 NOTE — ED Notes (Signed)
Lenna Sciara daughter ? ?[phone] ?

## 2021-12-30 NOTE — Evaluation (Signed)
Speech Language Pathology Evaluation ?Patient Details ?Name: DUNCAN ALEJANDRO ?MRN: [id number] ?DOB: Mar 28, 1934 ?Today's Date: 12/30/2021 ?Time: 1520-1530 ?SLP Time Calculation (min) (ACUTE ONLY): 10 min ? ?Problem List:  ?Patient Active Problem List  ? Diagnosis Date Noted  ? Acute ischemic stroke ([place]) 12/30/2021  ? Slurred speech   ? CVA (cerebral vascular accident) ([place]) 09/29/2021  ? Essential hypertension 09/29/2021  ? Hypokalemia 09/29/2021  ? CAD (coronary artery disease)   ? Hyperlipidemia   ? ?Past Medical History:  ?Past Medical History:  ?Diagnosis Date  ? CAD (coronary artery disease)   ? Hyperlipidemia   ? Low back strain   ? Shoulder injury   ? Fell and injured Right shoulder  ? ?Past Surgical History:  ?Past Surgical History:  ?Procedure Laterality Date  ? ANGIOPLASTY    ? 1992-LAD  ? CARDIAC CATHETERIZATION    ? ?HPI:  ?Pt is an 88 y.o. male admitted 12/29/21 with acute onset diffuse weakness, trouble standing up. Brain MRI with a small R frontal corona radiata subacute infarct; per neuro, suspect stroke is incidental. CTA with chronic proximal R PCA occlusion with faint downstream filling. PMH includes CAD, HLD, R shoulder injury, lower back strain, stroke (09/2021 with chronic R-side deficits). Patient with recent OP MBS complete 10/27/21: Recommend pt's liquids thickened to nectar consistency and SLP at SNF work with pt on performing breath hold strategy prior to swallow for possible upgrade to thin at bedside.  ? ?Assessment / Plan / Recommendation ?Clinical Impression ? Cognitive-linguistic evaluation complete. Patient presenting largely [facility] for age for all areas assessed. He has mild difficulty recallilng information presented following a short, 2-3 minute delay, however remaining WFL for age. He reported previous h/o dysarthria following CVA in 09/2021 however this has resolved according to patient. OME unremarkable. No f/u for speech, language or cognition indicated at this time. ?   ?SLP  Assessment ? SLP Recommendation/Assessment: Patient does not need any further [facility] ?SLP Visit Diagnosis: Dysphagia, pharyngeal phase (R13.13);Dysarthria and anarthria (R47.1)  ?  ?Recommendations for follow up therapy are one component of a multi-disciplinary discharge planning process, led by the attending physician.  Recommendations may be updated based on patient status, additional functional criteria and insurance authorization. ?   ?Follow Up Recommendations ?  (TBD)  ?  ?Assistance Recommended at Discharge ? None  ?Functional Status Assessment    ?Frequency and Duration min 2x/week  ?  ?  ?   ?SLP Evaluation ?Cognition ? Overall Cognitive Status: No family/caregiver present to determine baseline cognitive functioning (appears baseline)  ?  ?   ?Comprehension ? Auditory Comprehension ?Overall Auditory Comprehension: Appears within functional limits for tasks assessed ?Visual Recognition/Discrimination ?Discrimination: Within Function Limits  ?  ?Expression Expression ?Primary Mode of Expression: Verbal ?Verbal Expression ?Overall Verbal Expression: Appears within functional limits for tasks assessed   ?Oral / Motor ? Oral Motor/Sensory Function ?Overall Oral Motor/Sensory Function: Within functional limits ?Motor Speech ?Overall Motor Speech: Appears within functional limits for tasks assessed   ?        ?Yulianna Folse MA, CCC-SLP ? ?Debi Cousin Meryl ?12/30/2021, 3:51 PM ? ? ?

## 2021-12-30 NOTE — ED Notes (Signed)
PT at bedside.

## 2021-12-30 NOTE — ED Notes (Signed)
Neuro at bedside.

## 2021-12-30 NOTE — Evaluation (Signed)
Clinical/Bedside Swallow Evaluation ?Patient Details  ?Name: Clarence Myers ?MRN: [id number] ?Date of Birth: 06-24-1934 ? ?Today's Date: 12/30/2021 ?Time: SLP Start Time (ACUTE ONLY): 1500 SLP Stop Time (ACUTE ONLY): 1520 ?SLP Time Calculation (min) (ACUTE ONLY): 20 min ? ?Past Medical History:  ?Past Medical History:  ?Diagnosis Date  ? CAD (coronary artery disease)   ? Hyperlipidemia   ? Low back strain   ? Shoulder injury   ? Fell and injured Right shoulder  ? ?Past Surgical History:  ?Past Surgical History:  ?Procedure Laterality Date  ? ANGIOPLASTY    ? 1992-LAD  ? CARDIAC CATHETERIZATION    ? ?HPI:  ?Pt is an 88 y.o. male admitted 12/29/21 with acute onset diffuse weakness, trouble standing up. Brain MRI with a small R frontal corona radiata subacute infarct; per neuro, suspect stroke is incidental. CTA with chronic proximal R PCA occlusion with faint downstream filling. PMH includes CAD, HLD, R shoulder injury, lower back strain, stroke (09/2021 with chronic R-side deficits). Patient with recent OP MBS complete 10/27/21: Recommend pt's liquids thickened to nectar consistency and SLP at SNF work with pt on performing breath hold strategy prior to swallow for possible upgrade to thin at bedside.  ?  ?Assessment / Plan / Recommendation  ?Clinical Impression ? Overall, patient presents with what appears to be a functional oropharyngeal swallow with seemingly intact airway protection. One episode of subtle throat clearing noted of questionable origin. Could be indicative of penetration to the vocal cords. Vocal quality remained at baseline throughout po trials and oral phase [facility] for mastication of regular texture solids. Discussed dysphagia history with patient which included a MBS as an OP while at SNF in 10/2021. At that time, nectar thick liquids were recommended until patient could consistently complete a breath hold prior the swallow. When able, it was recommended he advance to thin liquids. Per patient, he was  never advanced at the SNF. Instead, patient chose to d/c use of thickened liquids when he returned home. He reports that he was unable to drink the thickened liquids and does not wish to return to them unless it is "life or death."  He was able to [employer] a breath hold with thin liquids with min verbal cueing, consuming thin liquids via small single cup sips independently, and independently verbalized need for no straws. Repeat instrumental testing not indicated at this time as patient does not wish to return to thickened liquids and based on performance today, do not suspect that swallowing function has worsened from prior MBS. Recommend continuing a regular diet, reinforcing use of swallowing precautions. With improved acute illness (per neuro, CVA incidental, unclear cause for acute bilateral weakness), could consider repeat instrumental exam to determine necessity of continuing utilization of breath hold/no straws. Will f/u. ?SLP Visit Diagnosis: Dysphagia, pharyngeal phase (R13.13) ?   ?Aspiration Risk ? Mild aspiration risk  ?  ?Diet Recommendation Regular;Thin liquid  ? ?Liquid Administration via: Straw;Cup ?Medication Administration: Whole meds with liquid ?Supervision: Patient able to self feed;Intermittent supervision to cue for compensatory strategies ?Compensations: Slow rate;Small sips/bites;Other (Comment) (breath hold before swallow with liquids) ?Postural Changes: Seated upright at 90 degrees  ?  ?Other  Recommendations Oral Care Recommendations: Oral care BID   ? ?Recommendations for follow up therapy are one component of a multi-disciplinary discharge planning process, led by the attending physician.  Recommendations may be updated based on patient status, additional functional criteria and insurance authorization. ? ?Follow up Recommendations  (TBD)  ? ? ?  ?  Assistance Recommended at Discharge None  ?Functional Status Assessment    ?Frequency and Duration min 2x/week  ?1 week ?  ?   ? ?Prognosis     ? ?  ? ?Swallow Study   ?General HPI: Pt is an 88 y.o. male admitted 12/29/21 with acute onset diffuse weakness, trouble standing up. Brain MRI with a small R frontal corona radiata subacute infarct; per neuro, suspect stroke is incidental. CTA with chronic proximal R PCA occlusion with faint downstream filling. PMH includes CAD, HLD, R shoulder injury, lower back strain, stroke (09/2021 with chronic R-side deficits). Patient with recent OP MBS complete 10/27/21: Recommend pt's liquids thickened to nectar consistency and SLP at SNF work with pt on performing breath hold strategy prior to swallow for possible upgrade to thin at bedside. ?Type of Study: Bedside Swallow Evaluation ?Previous Swallow Assessment: see HPI ?Diet Prior to this Study: NPO ?Temperature Spikes Noted: No ?Respiratory Status: Room air ?History of Recent Intubation: No ?Behavior/Cognition: Alert;Cooperative;Pleasant mood ?Oral Cavity Assessment: Within Functional Limits ?Oral Care Completed by SLP: No ?Oral Cavity - Dentition: Dentures, top;Dentures, bottom ?Vision: Functional for self-feeding ?Self-Feeding Abilities: Able to feed self ?Patient Positioning: Upright in bed ?Baseline Vocal Quality: Normal (mildly hoarse but baseline) ?Volitional Cough: Strong ?Volitional Swallow: Able to elicit  ?  ?Oral/Motor/Sensory Function Overall Oral Motor/Sensory Function: Within functional limits   ?Ice Chips Ice chips: Not tested   ?Thin Liquid Thin Liquid: Within functional limits ?Presentation: Cup;Self Fed  ?  ?Nectar Thick Nectar Thick Liquid: Not tested   ?Honey Thick Honey Thick Liquid: Not tested   ?Puree Puree: Not tested   ?Solid ? ? ?  Solid: Within functional limits ?Presentation: Self Fed  ? ?  ?Liana Camerer MA, CCC-SLP ? ?Deyona Soza Meryl ?12/30/2021,3:47 PM ? ? ? ? ?

## 2021-12-30 NOTE — TOC Initial Note (Addendum)
Transition of Care (TOC) - Initial/Assessment Note  ? ? ?Patient Details  ?Name: Clarence Myers ?MRN: [id number] ?Date of Birth: 1934-04-03 ? ?Transition of Care (TOC) CM/SW Contact:    ?Verdell Carmine, RN ?Phone Number: ?12/30/2021, 12:55 PM ? ?Clinical Narrative:                 ? ?Patient up in the chair eating, evaluation by PT recommending Home Health, he is opposed to SNF at thiis time. Reached out to Amty at [facility] for PT referral  ?1900 referral accepted from [facility] ?Expected Discharge Plan: [place] ?Barriers to Discharge: Continued Medical Work up ? ? ?Patient Goals and CMS Choice ?  ?  ?  ? ?Expected Discharge Plan and Services ?Expected Discharge Plan: [place] ?  ?Discharge Planning Services: CM Consult ?Post Acute Care Choice: Home Health ?Living arrangements for the past 2 months: [place] ?                ?  ?  ?  ?  ?  ?HH Arranged: PT ?[place] Agency: [place] ?Date HH Agency Contacted: 12/30/21 ?Time [place]: [id number] ?Representative spoke with at [place]: [place] ? ?Prior Living Arrangements/Services ?Living arrangements for the past 2 months: [place] ?  ?Patient language and need for interpreter reviewed:: Yes ?Do you feel safe going back to the place where you live?: Yes      ?Need for Family Participation in Patient Care: Yes (Comment) ?Care giver support system in place?: Yes (comment) ?  ?Criminal Activity/Legal Involvement Pertinent to Current Situation/Hospitalization: No - Comment as needed ? ?Activities of Daily Living ?  ?  ? ?Permission Sought/Granted ?  ?  ?   ?   ?   ?   ? ?Emotional Assessment ?  ?Attitude/Demeanor/Rapport: Engaged ?Affect (typically observed): Stable ?Orientation: : Oriented to Self, Oriented to Place, Oriented to  Time, Oriented to Situation ?Alcohol / Substance Use: Not Applicable ?Psych Involvement: No (comment) ? ?Admission diagnosis:  Acute ischemic stroke [facility])  [I63.9] ?Patient Active Problem List  ? Diagnosis Date Noted  ? Acute ischemic stroke (Clarence Myers) 12/30/2021  ? Slurred speech   ? CVA (cerebral vascular accident) ([place]) 09/29/2021  ? Essential hypertension 09/29/2021  ? Hypokalemia 09/29/2021  ? CAD (coronary artery disease)   ? Hyperlipidemia   ? ?PCP:  Mayra Neer, MD ?Pharmacy:   ?[facility] - [place] [address] [address] ?[place] ?[place] [address] ?Phone: [phone] Fax: [phone] ? ? ? ? ?Social Determinants of Health (SDOH) Interventions ?  ? ?Readmission Risk Interventions ?   ? View : No data to display.  ?  ?  ?  ? ? ? ?

## 2021-12-31 DIAGNOSIS — I639 Cerebral infarction, unspecified: Secondary | ICD-10-CM | POA: Diagnosis not present

## 2021-12-31 LAB — HEMOGLOBIN A1C
Hgb A1c MFr Bld: 5.6 % (ref 4.8–5.6)
Mean Plasma Glucose: 114.02 mg/dL

## 2021-12-31 LAB — BASIC METABOLIC PANEL
Anion gap: 6 (ref 5–15)
BUN: 20 mg/dL (ref 8–23)
CO2: 29 mmol/L (ref 22–32)
Calcium: 9.3 mg/dL (ref 8.9–10.3)
Chloride: 106 mmol/L (ref 98–111)
Creatinine, Ser: 0.97 mg/dL (ref 0.61–1.24)
GFR, Estimated: >60 mL/min (ref >60)
Glucose, Bld: 127 mg/dL — ABNORMAL HIGH (ref 70–99)
Potassium: 3.4 mmol/L — ABNORMAL LOW (ref 3.5–5.1)
Sodium: 141 mmol/L (ref 135–145)

## 2021-12-31 LAB — CBC
HCT: 39.4 % (ref 39.0–52.0)
Hemoglobin: 13 g/dL (ref 13.0–17.0)
MCH: 30.3 pg (ref 26.0–34.0)
MCHC: 33 g/dL (ref 30.0–36.0)
MCV: 91.8 fL (ref 80.0–100.0)
Platelets: 283 10*3/uL (ref 150–400)
RBC: 4.29 MIL/uL (ref 4.22–5.81)
RDW: 13.9 % (ref 11.5–15.5)
WBC: 7.9 10*3/uL (ref 4.0–10.5)
nRBC: 0 % (ref 0.0–0.2)

## 2021-12-31 MED ORDER — CLOPIDOGREL BISULFATE 75 MG PO TABS: 75.0000 mg | ORAL_TABLET | Freq: Every day | ORAL | Status: AC

## 2021-12-31 MED ORDER — POTASSIUM CHLORIDE CRYS ER 20 MEQ PO TBCR
40.0000 meq | EXTENDED_RELEASE_TABLET | Freq: Once | ORAL | Status: AC
  Administered 2021-12-31: 40 meq via ORAL
  Filled 2021-12-31: qty 2

## 2021-12-31 MED ORDER — TICAGRELOR 90 MG PO TABS: 90.0000 mg | ORAL_TABLET | Freq: Two times a day (BID) | ORAL | 0 refills | Status: AC

## 2021-12-31 MED ORDER — ROSUVASTATIN CALCIUM 20 MG PO TABS: 20.0000 mg | ORAL_TABLET | Freq: Every day | ORAL | 3 refills | Status: AC

## 2021-12-31 MED ORDER — ASPIRIN 81 MG PO TBEC: 81.0000 mg | DELAYED_RELEASE_TABLET | Freq: Every day | ORAL | 0 refills | Status: DC

## 2021-12-31 NOTE — Progress Notes (Signed)
PT Cancellation Note ? ?Patient Details ?Name: Clarence Myers ?MRN: [id number] ?DOB: Mar 05, 1934 ? ? ?Cancelled Treatment:    Reason Eval/Treat Not Completed: Other (comment) ? ?Patient evaluated by PT 12/30/21 and discharge recommendations are in chart. New imminent discharge order received. RN unaware of any reason PT needs to see today. Understand from OT that pt's family was present during evaluation 12/30/21 and able to provide necessary assist on discharge.  ? ? ?Arby Barrette, PT ?Acute Rehabilitation Services  ?Pager [phone] ?Office [phone] ? ?Jeanie Cooks Monda Chastain ?12/31/2021, 1:44 PM ?

## 2021-12-31 NOTE — ED Notes (Signed)
Pt incontinet of urine  lenin and bed changed pt alert ?

## 2021-12-31 NOTE — Progress Notes (Signed)
OT Cancellation Note ? ?Patient Details ?Name: Clarence Myers ?MRN: [id number] ?DOB: 07-13-1934 ? ? ?Cancelled Treatment:    Reason Eval/Treat Not Completed: Other (comment). Imminent Discharge order acknowledged. OT and PT and SLP Evaluated pt yesterday. HH OT and [place] PT ordered to follow along with pt having near 24/7 supervision/assist at home. Therapies will continue to follow pt while admitted in hospital.  ? ?[name] ?12/31/2021, 1:00 PM ?

## 2021-12-31 NOTE — ED Notes (Signed)
sleeping

## 2021-12-31 NOTE — Care Management Obs Status (Signed)
MEDICARE OBSERVATION STATUS NOTIFICATION ? ? ?Patient Details  ?Name: Clarence Myers ?MRN: [id number] ?Date of Birth: 1934-07-28 ? ? ?Medicare Observation Status Notification Given:  Yes ? ? ? ?Verdell Carmine, RN ?12/31/2021, 10:56 AM ?

## 2021-12-31 NOTE — Progress Notes (Signed)
STROKE TEAM PROGRESS NOTE  ? ?SUBJECTIVE (INTERVAL HISTORY) ?No family is at the bedside. Pt lying in bed, no acute event overnight. No complains. Possible d/c today.  ? ? ?OBJECTIVE ?Temp:  [97.1 ?F (36.2 ?C)] 97.1 ?F (36.2 ?C) (05/14 6222) ?Pulse Rate:  [75-81] 81 (05/14 0816) ?Resp:  [12-21] 17 (05/14 0816) ?BP: (114-136)/(75-92) 129/77 (05/14 0816) ?SpO2:  [96 %-98 %] 98 % (05/14 0816) ? ?Recent Labs  ?Lab 12/30/21 ?0806  ?GLUCAP 97  ? ?Recent Labs  ?Lab 12/29/21 ?2025 12/31/21 ?0915  ?NA 139 141  ?K 3.8 3.4*  ?CL 106 106  ?CO2 25 29  ?GLUCOSE 112* 127*  ?BUN 30* 20  ?CREATININE 1.01 0.97  ?CALCIUM 8.9 9.3  ? ?Recent Labs  ?Lab 12/29/21 ?2025  ?AST 24  ?ALT 14  ?ALKPHOS 68  ?BILITOT 0.3  ?PROT 6.6  ?ALBUMIN 3.1*  ? ?Recent Labs  ?Lab 12/29/21 ?2025 12/31/21 ?0915  ?WBC 8.8 7.9  ?NEUTROABS 4.4  --   ?HGB 11.5* 13.0  ?HCT 34.9* 39.4  ?MCV 91.6 91.8  ?PLT 271 283  ? ?No results for input(s): CKTOTAL, CKMB, CKMBINDEX, TROPONINI in the last 168 hours. ?No results for input(s): LABPROT, INR in the last 72 hours. ?Recent Labs  ?  12/29/21 ?2002  ?COLORURINE YELLOW  ?LABSPEC 1.009  ?PHURINE 5.0  ?GLUCOSEU NEGATIVE  ?HGBUR NEGATIVE  ?BILIRUBINUR NEGATIVE  ?KETONESUR NEGATIVE  ?PROTEINUR NEGATIVE  ?NITRITE NEGATIVE  ?LEUKOCYTESUR NEGATIVE  ?  ?   ?Component Value Date/Time  ? CHOL 153 12/30/2021 0431  ? TRIG 32 12/30/2021 0431  ? HDL 50 12/30/2021 0431  ? CHOLHDL 3.1 12/30/2021 0431  ? VLDL 6 12/30/2021 0431  ? [place] 97 12/30/2021 0431  ? ?Lab Results  ?Component Value Date  ? HGBA1C 5.6 12/31/2021  ? ?   ?Component Value Date/Time  ? LABOPIA NONE DETECTED 09/29/2021 1549  ? COCAINSCRNUR NONE DETECTED 09/29/2021 1549  ? LABBENZ NONE DETECTED 09/29/2021 1549  ? AMPHETMU NONE DETECTED 09/29/2021 1549  ? THCU NONE DETECTED 09/29/2021 1549  ? LABBARB NONE DETECTED 09/29/2021 1549  ?  ?No results for input(s): ETH in the last 168 hours. ? ?I have personally reviewed the radiological images below and agree with the radiology  interpretations. ? ?CT ANGIO HEAD NECK W WO CM ? ?Result Date: 12/30/2021 ?CLINICAL DATA:  Stroke follow-up.  Diffuse weakness EXAM: CT ANGIOGRAPHY HEAD AND NECK TECHNIQUE: Multidetector CT imaging of the head and neck was performed using the standard protocol during bolus administration of intravenous contrast. Multiplanar CT image reconstructions and MIPs were obtained to evaluate the vascular anatomy. Carotid stenosis measurements (when applicable) are obtained utilizing NASCET criteria, using the distal internal carotid diameter as the denominator. RADIATION DOSE REDUCTION: This exam was performed according to the departmental dose-optimization program which includes automated exposure control, adjustment of the mA and/or kV according to patient size and/or use of iterative reconstruction technique. CONTRAST:  10m OMNIPAQUE IOHEXOL 350 MG/ML SOLN COMPARISON:  Brain MRI from earlier today. CTA of the head neck 09/30/2021 FINDINGS: CTA NECK FINDINGS Aortic arch: Atheromatous plaque with 3 vessel branching. No acute finding or dilatation. Right carotid system: Moderate to bulky calcified plaque at the bifurcation. No flow limiting stenosis or ulceration. Left carotid system: Mild to moderate calcified plaque at the bifurcation. No flow limiting stenosis or ulceration. Vertebral arteries: Proximal subclavian atherosclerosis. The left vertebral artery is dominant. Calcified plaque at both vertebral origins with 30% narrowing measured on the left by coronal reformats. Otherwise widely patent  and smoothly contoured vertebral arteries. Skeleton: Ordinary cervical spine degeneration. Other neck: No acute finding Upper chest: No acute finding Review of the MIP images confirms the above findings CTA HEAD FINDINGS Anterior circulation: Calcified plaque along the carotid siphons with mild bilateral paraclinoid stenosis. No branch occlusion, beading, or aneurysm. No proximal flow limiting stenosis. Posterior circulation:  Calcified plaque on the dominant left V4 segment without significant stenosis. The vertebral and basilar arteries are smoothly contoured and diffusely patent. Effective occlusion of the proximal right PCA but with downstream reconstitution faintly filling cortical branches. Venous sinuses: Unremarkable Anatomic variants: Unremarkable Review of the MIP images confirms the above findings IMPRESSION: 1. No emergent finding. 2. Atherosclerosis in the neck without flow limiting stenosis or ulceration. 3. 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 Electronically Signed   By: Jorje Guild M.D.   On: 12/30/2021 05:22  ? ?CT Head Wo Contrast ? ?Result Date: 12/29/2021 ?CLINICAL DATA:  Dizziness and weakness, initial encounter EXAM: CT HEAD WITHOUT CONTRAST TECHNIQUE: Contiguous axial images were obtained from the base of the skull through the vertex without intravenous contrast. RADIATION DOSE REDUCTION: This exam was performed according to the departmental dose-optimization program which includes automated exposure control, adjustment of the mA and/or kV according to patient size and/or use of iterative reconstruction technique. COMPARISON:  10/02/2021 FINDINGS: Brain: No evidence of acute infarction, hemorrhage, hydrocephalus, extra-axial collection or mass lesion/mass effect. There are changes consistent with lacunar infarct in the left half of the pons progressed from the prior exam as well as areas of chronic white matter ischemia. Atrophic changes are noted as well. Vascular: No hyperdense vessel or unexpected calcification. Skull: Normal. Negative for fracture or focal lesion. Sinuses/Orbits: No acute finding. Other: None. IMPRESSION: Chronic atrophic and ischemic changes without acute abnormality. Electronically Signed   By: Inez Catalina M.D.   On: 12/29/2021 20:05  ? ?MR Brain Wo Contrast (neuro protocol) ? ?Result Date: 12/30/2021 ?CLINICAL DATA:  Initial evaluation for neuro deficit,  stroke suspected. EXAM: MRI HEAD WITHOUT CONTRAST TECHNIQUE: Multiplanar, multiecho pulse sequences of the brain and surrounding structures were obtained without intravenous contrast. COMPARISON:  Prior CT from 12/29/2021. FINDINGS: Brain: Generalized age-related cerebral atrophy with moderately advanced chronic microvascular ischemic disease. Remote hemorrhagic infarct present at the posterior right corona radiata. Additional small remote cortical infarct at the right occipital lobe with associated chronic hemosiderin staining. Remote left paramedian pontine infarct. Additional small remote lacunar infarct at the right caudate. r 9 mm focus of diffusion abnormality involving the posterior left frontal corona radiata (series 9, image 79). Associated T2/FLAIR signal intensity without definite ADC correlate. Finding likely reflects a small evolving subacute ischemic infarct. No associated hemorrhage or mass effect. No other evidence for acute or subacute ischemia. Gray-white matter differentiation otherwise maintained. No acute intracranial hemorrhage. No mass lesion, midline shift or mass effect. Ventricular prominence related global parenchymal volume loss of hydrocephalus. No extra-axial fluid collection. Pituitary gland suprasellar region within normal limits. Midline structures intact. Vascular: Major intracranial vascular flow voids are maintained. Skull and upper cervical spine: Basilar junction normal. Bone marrow signal intensity within normal limits. No scalp soft tissue abnormality. Sinuses/Orbits: Prior bilateral ocular lens replacement. Small left maxillary sinus retention cyst. Scattered mucosal thickening within the ethmoidal air cells. No mastoid effusion. Other: None. IMPRESSION: 1. 9 mm focus of diffusion abnormality involving the posterior left frontal corona radiata, likely reflecting a small evolving subacute ischemic infarct. No associated hemorrhage or mass effect. 2. No other acute intracranial  abnormality. 3. Age-related cerebral atrophy with moderately advanced chronic microvascular ischemic disease, with multiple additional remote infarcts as above. Electronically Signed   By: Alda Lea

## 2021-12-31 NOTE — Care Management (Addendum)
Consult for medication assistance with Brilinta  Brilinta card to be given to patient ?[facility] card given to patient.  ?Patient is with [facility] currently. Bradd Canary- rep at [employer] for disciplines, will reorder for home. Patient has all DME needed.  ?

## 2021-12-31 NOTE — Discharge Summary (Signed)
?[facility] ? ?Physician Discharge Summary  ? ?Patient ID: ?Clarence Myers ?MRN: [id number] ?DOB/AGE: Apr 10, 1934 88 y.o. ? ?Admit date: 12/29/2021 ?Discharge date: 12/31/2021   ? ?PCP: Mayra Neer, MD ? ?DISCHARGE DIAGNOSES:  ?Acute stroke ?Essential hypertension ?Hyperlipidemia ? ? ?RECOMMENDATIONS FOR OUTPATIENT FOLLOW UP: ?Ambulatory referral sent to neurology for follow-up ?Message sent to cardiology office to arrange event monitor. ? ? ? ?Home Health: PT OT ?Equipment/Devices: None ? ?CODE STATUS: Full code ? ?DISCHARGE CONDITION: fair ? ?Diet recommendation: Heart healthy ? ?INITIAL HISTORY: ?88 y.o. male with medical history significant of HLD, prior stroke(s) in Feb of this year with R sided weakness and dysphagia at that time.  Presented with diffuse weakness.  No focal deficits were noted.  MRI however showed subacute infarct.  Patient was hospitalized for further management. ?  ?Consultants: Neurology ?  ?Procedures: None ? ?HOSPITAL COURSE:  ? ?Acute ischemic stroke ?Patient with recent stroke in February.  MRI showed an acute infarct.  Neurology was consulted.  Patient still with subtle right arm weakness from his previous stroke. ?No need to repeat echocardiogram since one was done recently. ?No significant stenosis noted on CT angiogram. ?LDL is 97.  Statin dose has been increased. ?HbA1c 5.6 ?Patient was on Plavix prior to admission.   ?Plan is for him to take aspirin and Brilinta for 1 month and then back to Plavix.   ?Neurology also recommends 30-day cardiac monitor.  Message has been sent to cardiology to arrange the same.   ?Seen by PT and OT.  Home health is recommended. ?Discussed with neurology today.  No further work-up is necessary.  Okay for discharge home.  Discussed with his daughter.  Potassium will be repleted prior to discharge. ?  ?Essential hypertension ?Stable. ? ?Hyperlipidemia ?Statin dose has been increased. ? ?Patient is stable.  Okay for discharge home  today. ? ? ?PERTINENT LABS: ? ?The results of significant diagnostics from this hospitalization (including imaging, microbiology, ancillary and laboratory) are listed below for reference.   ? ?Microbiology: ?Recent Results (from the past 240 hour(s))  ?Resp Panel by RT-PCR (Flu A&B, Covid) Nasopharyngeal Swab     Status: None  ? Collection Time: 12/29/21  7:29 PM  ? Specimen: Nasopharyngeal Swab; Nasopharyngeal(NP) swabs in vial transport medium  ?Result Value Ref Range Status  ? SARS Coronavirus 2 by RT PCR NEGATIVE NEGATIVE Final  ?  Comment: (NOTE) ?SARS-CoV-2 target nucleic acids are NOT DETECTED. ? ?The SARS-CoV-2 RNA is generally detectable in upper respiratory ?specimens during the acute phase of infection. The lowest ?concentration of SARS-CoV-2 viral copies this assay can detect is ?138 copies/mL. A negative result does not preclude SARS-Cov-2 ?infection and should not be used as the sole basis for treatment or ?other patient management decisions. A negative result may occur with  ?improper specimen collection/handling, submission of specimen other ?than nasopharyngeal swab, presence of viral mutation(s) within the ?areas targeted by this assay, and inadequate number of viral ?copies(<138 copies/mL). A negative result must be combined with ?clinical observations, patient history, and epidemiological ?information. The expected result is Negative. ? ?Fact Sheet for Patients:  ?EntrepreneurPulse.com.au ? ?Fact Sheet for Healthcare Providers:  ?IncredibleEmployment.be ? ?This test is no t yet approved or cleared by the [place] FDA and  ?has been authorized for detection and/or diagnosis of SARS-CoV-2 by ?FDA under an Emergency Use Authorization (EUA). This EUA will remain  ?in effect (meaning this test can be used) for the duration of the ?COVID-19 declaration under Section 564(b)(1)  of the Act, 21 ?U.S.C.section 360bbb-3(b)(1), unless the authorization is terminated   ?or revoked sooner.  ? ? ?  ? Influenza A by PCR NEGATIVE NEGATIVE Final  ? Influenza B by PCR NEGATIVE NEGATIVE Final  ?  Comment: (NOTE) ?The Xpert Xpress SARS-CoV-2/FLU/RSV plus assay is intended as an aid ?in the diagnosis of influenza from Nasopharyngeal swab specimens and ?should not be used as a sole basis for treatment. Nasal washings and ?aspirates are unacceptable for Xpert Xpress SARS-CoV-2/FLU/RSV ?testing. ? ?Fact Sheet for Patients: ?EntrepreneurPulse.com.au ? ?Fact Sheet for Healthcare Providers: ?IncredibleEmployment.be ? ?This test is not yet approved or cleared by the [place] FDA and ?has been authorized for detection and/or diagnosis of SARS-CoV-2 by ?FDA under an Emergency Use Authorization (EUA). This EUA will remain ?in effect (meaning this test can be used) for the duration of the ?COVID-19 declaration under Section 564(b)(1) of the Act, 21 U.S.C. ?section 360bbb-3(b)(1), unless the authorization is terminated or ?revoked. ? ?Performed at [facility], [place] [address], [place], [place] ?[address] ?  ?  ? ?Labs: ? ?COVID-19 Labs ? ? ?Lab Results  ?Component Value Date  ? [place] NEGATIVE 12/29/2021  ? [place] NEGATIVE 09/29/2021  ? ? ? ? ?Basic Metabolic Panel: ?Recent Labs  ?Lab 12/29/21 ?2025 12/31/21 ?0915  ?NA 139 141  ?K 3.8 3.4*  ?CL 106 106  ?CO2 25 29  ?GLUCOSE 112* 127*  ?BUN 30* 20  ?CREATININE 1.01 0.97  ?CALCIUM 8.9 9.3  ? ?Liver Function Tests: ?Recent Labs  ?Lab 12/29/21 ?2025  ?AST 24  ?ALT 14  ?ALKPHOS 68  ?BILITOT 0.3  ?PROT 6.6  ?ALBUMIN 3.1*  ? ? ?CBC: ?Recent Labs  ?Lab 12/29/21 ?2025 12/31/21 ?0915  ?WBC 8.8 7.9  ?NEUTROABS 4.4  --   ?HGB 11.5* 13.0  ?HCT 34.9* 39.4  ?MCV 91.6 91.8  ?PLT 271 283  ? ? ? ?CBG: ?Recent Labs  ?Lab 12/30/21 ?0806  ?GLUCAP 97  ? ? ? ?IMAGING STUDIES ?CT ANGIO HEAD NECK W WO CM ? ?Result Date: 12/30/2021 ?CLINICAL DATA:  Stroke follow-up.  Diffuse weakness EXAM: CT ANGIOGRAPHY HEAD AND NECK  TECHNIQUE: Multidetector CT imaging of the head and neck was performed using the standard protocol during bolus administration of intravenous contrast. Multiplanar CT image reconstructions and MIPs were obtained to evaluate the vascular anatomy. Carotid stenosis measurements (when applicable) are obtained utilizing NASCET criteria, using the distal internal carotid diameter as the denominator. RADIATION DOSE REDUCTION: This exam was performed according to the departmental dose-optimization program which includes automated exposure control, adjustment of the mA and/or kV according to patient size and/or use of iterative reconstruction technique. CONTRAST:  71m OMNIPAQUE IOHEXOL 350 MG/ML SOLN COMPARISON:  Brain MRI from earlier today. CTA of the head neck 09/30/2021 FINDINGS: CTA NECK FINDINGS Aortic arch: Atheromatous plaque with 3 vessel branching. No acute finding or dilatation. Right carotid system: Moderate to bulky calcified plaque at the bifurcation. No flow limiting stenosis or ulceration. Left carotid system: Mild to moderate calcified plaque at the bifurcation. No flow limiting stenosis or ulceration. Vertebral arteries: Proximal subclavian atherosclerosis. The left vertebral artery is dominant. Calcified plaque at both vertebral origins with 30% narrowing measured on the left by coronal reformats. Otherwise widely patent and smoothly contoured vertebral arteries. Skeleton: Ordinary cervical spine degeneration. Other neck: No acute finding Upper chest: No acute finding Review of the MIP images confirms the above findings CTA HEAD FINDINGS Anterior circulation: Calcified plaque along the carotid siphons with mild bilateral  paraclinoid stenosis. No branch occlusion, beading, or aneurysm. No proximal flow limiting stenosis. Posterior circulation: Calcified plaque on the dominant left V4 segment without significant stenosis. The vertebral and basilar arteries are smoothly contoured and diffusely patent.  Effective occlusion of the proximal right PCA but with downstream reconstitution faintly filling cortical branches. Venous sinuses: Unremarkable Anatomic variants: Unremarkable Review of the MIP images confirms the above find

## 2021-12-31 NOTE — ED Notes (Signed)
Pt verbalizes understanding of discharge instructions. Opportunity for questions and answers were provided. Pt discharged from the ED.   ?

## 2022-01-02 ENCOUNTER — Other Ambulatory Visit: Payer: Self-pay | Admitting: Cardiology

## 2022-01-02 ENCOUNTER — Encounter: Payer: Self-pay | Admitting: *Deleted

## 2022-01-02 ENCOUNTER — Telehealth: Payer: Self-pay | Admitting: Cardiology

## 2022-01-02 ENCOUNTER — Other Ambulatory Visit: Payer: Self-pay

## 2022-01-02 DIAGNOSIS — I639 Cerebral infarction, unspecified: Secondary | ICD-10-CM

## 2022-01-02 DIAGNOSIS — I2510 Atherosclerotic heart disease of native coronary artery without angina pectoris: Secondary | ICD-10-CM

## 2022-01-02 DIAGNOSIS — I350 Nonrheumatic aortic (valve) stenosis: Secondary | ICD-10-CM

## 2022-01-02 DIAGNOSIS — I4891 Unspecified atrial fibrillation: Secondary | ICD-10-CM

## 2022-01-02 DIAGNOSIS — I491 Atrial premature depolarization: Secondary | ICD-10-CM

## 2022-01-02 NOTE — Progress Notes (Signed)
Patient ID: Clarence Myers, male   DOB: 06/07/1934, 88 y.o.   MRN: [id number] ?Patient enrolled for [employer] to ship a 30 dau cardiac event monitor to his address on file. ?Letter with instructions mailed to patient. ?

## 2022-01-02 NOTE — Telephone Encounter (Signed)
Spoke to patient's daughter Lattie Haw Dr.Jordan advised to have monitor first.Stated she had a note to call office to schedule.Advised I placed monitor order.Monitor will be mailed with instructions on how to put on.Advised there is a # on box to call if you have any questions.She also stated she needs to schedule father a echo in June and follow up appointment with Dr.Jordan.Advised scheduler will call back with appointments. ?

## 2022-01-02 NOTE — Telephone Encounter (Signed)
Daughter of the patient called. The patient has information about a 30 day event monitor in his recent Hospital discharge summary. The daughter wanted to know if Dr. Martinique got the message and was going to be ordering that. Please advise  ?

## 2022-01-02 NOTE — Progress Notes (Signed)
Echo ordered.

## 2022-01-02 NOTE — Telephone Encounter (Signed)
Per d/c summary: Neurology also recommends 30-day cardiac monitor.  Message has been sent to cardiology to arrange the same.  ? ? ?Last OV 03/2021 with Dr. Martinique.  Will route to MD to review.    ?

## 2022-01-03 DIAGNOSIS — F39 Unspecified mood [affective] disorder: Secondary | ICD-10-CM | POA: Diagnosis not present

## 2022-01-03 DIAGNOSIS — I679 Cerebrovascular disease, unspecified: Secondary | ICD-10-CM | POA: Diagnosis not present

## 2022-01-03 DIAGNOSIS — R5383 Other fatigue: Secondary | ICD-10-CM | POA: Diagnosis not present

## 2022-01-03 DIAGNOSIS — E46 Unspecified protein-calorie malnutrition: Secondary | ICD-10-CM | POA: Diagnosis not present

## 2022-01-03 DIAGNOSIS — I69351 Hemiplegia and hemiparesis following cerebral infarction affecting right dominant side: Secondary | ICD-10-CM | POA: Diagnosis not present

## 2022-01-03 DIAGNOSIS — K5900 Constipation, unspecified: Secondary | ICD-10-CM | POA: Diagnosis not present

## 2022-01-10 DIAGNOSIS — I2510 Atherosclerotic heart disease of native coronary artery without angina pectoris: Secondary | ICD-10-CM | POA: Diagnosis not present

## 2022-01-10 DIAGNOSIS — I639 Cerebral infarction, unspecified: Secondary | ICD-10-CM | POA: Diagnosis not present

## 2022-01-14 ENCOUNTER — Ambulatory Visit (INDEPENDENT_AMBULATORY_CARE_PROVIDER_SITE_OTHER): Payer: PPO

## 2022-01-14 DIAGNOSIS — I639 Cerebral infarction, unspecified: Secondary | ICD-10-CM

## 2022-01-14 DIAGNOSIS — I4891 Unspecified atrial fibrillation: Secondary | ICD-10-CM

## 2022-01-14 DIAGNOSIS — I6389 Other cerebral infarction: Secondary | ICD-10-CM | POA: Diagnosis not present

## 2022-01-14 DIAGNOSIS — I2510 Atherosclerotic heart disease of native coronary artery without angina pectoris: Secondary | ICD-10-CM

## 2022-01-14 DIAGNOSIS — I491 Atrial premature depolarization: Secondary | ICD-10-CM | POA: Diagnosis not present

## 2022-01-15 DIAGNOSIS — I639 Cerebral infarction, unspecified: Secondary | ICD-10-CM | POA: Diagnosis not present

## 2022-01-15 DIAGNOSIS — I491 Atrial premature depolarization: Secondary | ICD-10-CM | POA: Diagnosis not present

## 2022-01-15 DIAGNOSIS — I4891 Unspecified atrial fibrillation: Secondary | ICD-10-CM | POA: Diagnosis not present

## 2022-01-27 DIAGNOSIS — R2681 Unsteadiness on feet: Secondary | ICD-10-CM | POA: Diagnosis not present

## 2022-01-27 DIAGNOSIS — G8101 Flaccid hemiplegia affecting right dominant side: Secondary | ICD-10-CM | POA: Diagnosis not present

## 2022-01-27 DIAGNOSIS — M6281 Muscle weakness (generalized): Secondary | ICD-10-CM | POA: Diagnosis not present

## 2022-01-27 DIAGNOSIS — I639 Cerebral infarction, unspecified: Secondary | ICD-10-CM | POA: Diagnosis not present

## 2022-02-06 DIAGNOSIS — E46 Unspecified protein-calorie malnutrition: Secondary | ICD-10-CM | POA: Diagnosis not present

## 2022-02-06 DIAGNOSIS — F39 Unspecified mood [affective] disorder: Secondary | ICD-10-CM | POA: Diagnosis not present

## 2022-02-06 DIAGNOSIS — I10 Essential (primary) hypertension: Secondary | ICD-10-CM | POA: Diagnosis not present

## 2022-02-06 DIAGNOSIS — R5383 Other fatigue: Secondary | ICD-10-CM | POA: Diagnosis not present

## 2022-02-06 DIAGNOSIS — I69351 Hemiplegia and hemiparesis following cerebral infarction affecting right dominant side: Secondary | ICD-10-CM | POA: Diagnosis not present

## 2022-02-06 DIAGNOSIS — E785 Hyperlipidemia, unspecified: Secondary | ICD-10-CM | POA: Diagnosis not present

## 2022-02-09 ENCOUNTER — Ambulatory Visit (HOSPITAL_COMMUNITY): Payer: PPO | Attending: Cardiology

## 2022-02-09 DIAGNOSIS — I639 Cerebral infarction, unspecified: Secondary | ICD-10-CM | POA: Diagnosis not present

## 2022-02-09 DIAGNOSIS — I350 Nonrheumatic aortic (valve) stenosis: Secondary | ICD-10-CM | POA: Diagnosis not present

## 2022-02-09 LAB — ECHOCARDIOGRAM COMPLETE
AR max vel: 0.88 cm2
AV Area VTI: 0.92 cm2
AV Area mean vel: 0.93 cm2
AV Mean grad: 17 mmHg
AV Peak grad: 33.2 mmHg
Ao pk vel: 2.88 m/s
Area-P 1/2: 1.98 cm2
S' Lateral: 3.2 cm

## 2022-02-10 DIAGNOSIS — I2510 Atherosclerotic heart disease of native coronary artery without angina pectoris: Secondary | ICD-10-CM | POA: Diagnosis not present

## 2022-02-10 DIAGNOSIS — I639 Cerebral infarction, unspecified: Secondary | ICD-10-CM | POA: Diagnosis not present

## 2022-02-21 DIAGNOSIS — F39 Unspecified mood [affective] disorder: Secondary | ICD-10-CM | POA: Diagnosis not present

## 2022-02-21 DIAGNOSIS — R5383 Other fatigue: Secondary | ICD-10-CM | POA: Diagnosis not present

## 2022-02-21 DIAGNOSIS — I69351 Hemiplegia and hemiparesis following cerebral infarction affecting right dominant side: Secondary | ICD-10-CM | POA: Diagnosis not present

## 2022-02-21 DIAGNOSIS — E46 Unspecified protein-calorie malnutrition: Secondary | ICD-10-CM | POA: Diagnosis not present

## 2022-02-21 DIAGNOSIS — E785 Hyperlipidemia, unspecified: Secondary | ICD-10-CM | POA: Diagnosis not present

## 2022-02-21 DIAGNOSIS — I10 Essential (primary) hypertension: Secondary | ICD-10-CM | POA: Diagnosis not present

## 2022-02-26 DIAGNOSIS — M6281 Muscle weakness (generalized): Secondary | ICD-10-CM | POA: Diagnosis not present

## 2022-02-26 DIAGNOSIS — R2681 Unsteadiness on feet: Secondary | ICD-10-CM | POA: Diagnosis not present

## 2022-02-26 DIAGNOSIS — I639 Cerebral infarction, unspecified: Secondary | ICD-10-CM | POA: Diagnosis not present

## 2022-02-26 DIAGNOSIS — G8101 Flaccid hemiplegia affecting right dominant side: Secondary | ICD-10-CM | POA: Diagnosis not present

## 2022-03-07 DIAGNOSIS — L600 Ingrowing nail: Secondary | ICD-10-CM | POA: Diagnosis not present

## 2022-03-12 DIAGNOSIS — I639 Cerebral infarction, unspecified: Secondary | ICD-10-CM | POA: Diagnosis not present

## 2022-03-12 DIAGNOSIS — I2510 Atherosclerotic heart disease of native coronary artery without angina pectoris: Secondary | ICD-10-CM | POA: Diagnosis not present

## 2022-03-12 NOTE — Patient Instructions (Signed)
Below is our plan:  Left CR small infarct likely secondary to small vessel disease by location.  However, given prior embolic stroke and frequent PVCs on telemetry, concerning for cardioembolic source also: Residual deficit: right arm weakness from CVA in 09/2021. Continue clopidogrel 75 mg daily  and rosuvastatin '20mg'$  and Zocor '40mg'$  for secondary stroke prevention.  Discussed secondary stroke prevention measures and importance of close PCP follow up for aggressive stroke risk factor management. I have gone over the pathophysiology of stroke, warning signs and symptoms, risk factors and their management in some detail with instructions to go to the closest emergency room for symptoms of concern. HTN: BP goal <130/90.  Stable on metoprolol 12.'5mg'$  twice daily per PCP HLD: LDL goal <70. Recent LDL 97. Continue rosuvastatin '20mg'$  per PCP.   Please make sure you are staying well hydrated. I recommend 50-60 ounces daily. Well balanced diet and regular exercise encouraged. Consistent sleep schedule with 6-8 hours recommended.   Please continue follow up with care team as directed.   Follow up with me in 6 months   You may receive a survey regarding today's visit. I encourage you to leave honest feed back as I do use this information to improve patient care. Thank you for seeing me today!

## 2022-03-12 NOTE — Progress Notes (Unsigned)
[facility] [address] [place]. [place] [address] [phone]       HOSPITAL FOLLOW UP NOTE  [name] Date of Birth:  1934-06-18 Medical Record Number:  [id number]   Reason for Referral:  hospital stroke follow up    SUBJECTIVE:   CHIEF COMPLAINT:  No chief complaint on file.   HPI:   Clarence Myers is a 88 y.o. who  has a past medical history of CAD (coronary artery disease), Hyperlipidemia, Low back strain, and Shoulder injury.  Patient presented on 12/29/2021 with acute diffuse weakness upon waking that morning. He was having a difficult time standing up. MRI showed 34m focus of diffusion in posterior left frontal corona radiata, likely small evolving subacute ischemic infarct. He was admitted for previous CVA in 09/2021 with residual right upper ext weakness. He was started on [place] asa continued. After 1 month he was to resume Plavix and continue asa '81mg'$ . PT/OT recommended outpatient therapy. Personally reviewed hospitalization pertinent progress notes, lab work and imaging.  Evaluated by XErlinda Hong   Cardiology?   PERTINENT IMAGING/LABS  MRI left CR small infarct CTA head and neck right PCA chronic occlusion, left VA, bilateral siphon and bilateral ICA bulb atherosclerosis 2D Echo EF 65 to 70% in 09/2021 Recommend 30-day cardiac event monitoring as outpt to rule out A-fib. If negative, will consider loop recorder   A1C Lab Results  Component Value Date   HGBA1C 5.6 12/31/2021    Lipid Panel     Component Value Date/Time   CHOL 153 12/30/2021 0431   TRIG 32 12/30/2021 0431   HDL 50 12/30/2021 0431   CHOLHDL 3.1 12/30/2021 0431   VLDL 6 12/30/2021 0431   LDLCALC 97 12/30/2021 0431      ROS:   14 system review of systems performed and negative with exception of those listed in HPI  PMH:  Past Medical History:  Diagnosis Date   CAD (coronary artery disease)    Hyperlipidemia    Low back strain    Shoulder injury     Fell and injured Right shoulder    PSH:  Past Surgical History:  Procedure Laterality Date   ANGIOPLASTY     1992-LAD   CARDIAC CATHETERIZATION      Social History:  Social History   Socioeconomic History   Marital status: Married    Spouse name: Not on file   Number of children: Not on file   Years of education: Not on file   Highest education level: Not on file  Occupational History   Not on file  Tobacco Use   Smoking status: Never   Smokeless tobacco: Never  Substance and Sexual Activity   Alcohol use: Not on file   Drug use: Not on file   Sexual activity: Not on file  Other Topics Concern   Not on file  Social History Narrative   Not on file   Social Determinants of Health   Financial Resource Strain: Not on file  Food Insecurity: Not on file  Transportation Needs: Not on file  Physical Activity: Not on file  Stress: Not on file  Social Connections: Not on file  Intimate Partner Violence: Not on file    Family History: No family history on file.  Medications:   Current Outpatient Medications on File Prior to Visit  Medication Sig Dispense Refill   acetaminophen (TYLENOL) 325 MG tablet Take 2 tablets (650 mg total) by mouth every 4 (four) hours as needed for  mild pain (or temp > 37.5 C (99.5 F)).     aspirin EC 81 MG EC tablet Take 1 tablet (81 mg total) by mouth daily. Swallow whole. 30 tablet 0   clopidogrel (PLAVIX) 75 MG tablet Take 1 tablet (75 mg total) by mouth daily. RESUME ONLY AFTER COMPLETION OF 30 DAYS OF ASPIRIN AND BRILINTA     LUMIGAN 0.01 % SOLN Place 1 drop into both eyes at bedtime.     metoprolol tartrate (LOPRESSOR) 25 MG tablet Take 12.5 mg by mouth 2 (two) times daily.     mirtazapine (REMERON) 15 MG tablet Take 15 mg by mouth at bedtime.     Multiple Vitamin (MULTI-VITAMIN PO) Take by mouth daily.       multivitamin-lutein (OCUVITE-LUTEIN) CAPS Take 1 capsule by mouth daily.       Omega-3 Fatty Acids (FISH OIL PO) Take 1,000 mg  by mouth in the morning and at bedtime.     omeprazole (PRILOSEC) 20 MG capsule Take 20 mg by mouth daily.     Probiotic Product (PROBIOTIC DAILY PO) Take 1 capsule by mouth daily.     RESTASIS 0.05 % ophthalmic emulsion Place 1 drop into both eyes 2 (two) times daily.     rosuvastatin (CRESTOR) 20 MG tablet Take 1 tablet (20 mg total) by mouth daily. 30 tablet 3   sertraline (ZOLOFT) 25 MG tablet Take 25 mg by mouth at bedtime.     No current facility-administered medications on file prior to visit.    Allergies:   Allergies  Allergen Reactions   Blue Crab (Cavinectes Sapidus) Allergy Skin Test     "Hives"    Other     Bee Stings      OBJECTIVE:  Physical Exam  There were no vitals filed for this visit. There is no height or weight on file to calculate BMI. No results found.      No data to display           General: well developed, well nourished, seated, in no evident distress Head: head normocephalic and atraumatic.   Neck: supple with no carotid or supraclavicular bruits Cardiovascular: regular rate and rhythm, no murmurs Musculoskeletal: no deformity Skin:  no rash/petichiae Vascular:  Normal pulses all extremities   Neurologic Exam Mental Status: Awake and fully alert.  Fluent speech and language.  Oriented to place and time. Recent and remote memory intact. Attention span, concentration and fund of knowledge appropriate. Mood and affect appropriate.  Cranial Nerves: Fundoscopic exam reveals sharp disc margins. Pupils equal, briskly reactive to light. Extraocular movements full without nystagmus. Visual fields full to confrontation. Hearing intact. Facial sensation intact. Face, tongue, palate moves normally and symmetrically.  Motor: Normal bulk and tone. Normal strength in all tested extremity muscles Sensory.: intact to touch , pinprick , position and vibratory sensation.  Coordination: Rapid alternating movements normal in all extremities. Finger-to-nose  and heel-to-shin performed accurately bilaterally. Gait and Station: Arises from chair without difficulty. Stance is normal. Gait demonstrates normal stride length and balance with ***. Tandem walk and heel toe ***.  Reflexes: 1+ and symmetric.    NIHSS  *** Modified Rankin  ***    ASSESSMENT: Clarence Myers is a 88 y.o. year old male presenting to the ER 12/29/2021 with generalized weakness. Vascular risk factors include HTN, HLD, advanced age, previous CVA.    PLAN:  Left CR small infarct likely secondary to small vessel disease by location.  However, given prior embolic stroke  and frequent PVCs on telemetry, concerning for cardioembolic source also: Residual deficit: right arm weakness from CVA in 09/2021. Continue aspirin 81 mg daily and clopidogrel 75 mg daily  and rosuvastatin '20mg'$  and Zocor '40mg'$  for secondary stroke prevention.  Discussed secondary stroke prevention measures and importance of close PCP follow up for aggressive stroke risk factor management. I have gone over the pathophysiology of stroke, warning signs and symptoms, risk factors and their management in some detail with instructions to go to the closest emergency room for symptoms of concern. HTN: BP goal <130/90.  Stable on *** per PCP HLD: LDL goal <70. Recent LDL 97. Continue rosuvastatin '20mg'$  per PCP.  DMII: A1c goal<7.0. Recent A1c ***.    Follow up in *** or call earlier if needed   CC:  [facility] provider: Dr. Leonie Man PCP: Mayra Neer, MD    I spent *** minutes of face-to-face and non-face-to-face time with patient.  This included previsit chart review including review of recent hospitalization, lab review, study review, order entry, electronic health record documentation, patient education regarding recent stroke including etiology, secondary stroke prevention measures and importance of managing stroke risk factors, residual deficits and typical recovery time and answered all other questions to patient  satisfaction   Debbora Presto, [facility]  [facility] [address] [place], [place] [address]  Phone [phone] Fax [phone] Note: This document was prepared with digital dictation and possible smart phrase technology. Any transcriptional errors that result from this process are unintentional.

## 2022-03-13 ENCOUNTER — Encounter: Payer: Self-pay | Admitting: Family Medicine

## 2022-03-13 ENCOUNTER — Ambulatory Visit: Payer: PPO | Admitting: Family Medicine

## 2022-03-13 VITALS — BP 132/68 | HR 59 | Ht 66.0 in | Wt 126.0 lb

## 2022-03-13 DIAGNOSIS — I639 Cerebral infarction, unspecified: Secondary | ICD-10-CM | POA: Diagnosis not present

## 2022-03-14 NOTE — Progress Notes (Signed)
I agree with the above plan 

## 2022-03-15 ENCOUNTER — Telehealth: Payer: Self-pay | Admitting: Family Medicine

## 2022-03-15 NOTE — Telephone Encounter (Signed)
Please call his daughter and let her know that I did talk to Dr Leonie Man about continuing the aspirin and Plavix. Since he does not have any cardiac or cerebrovascular stents, we are ok to continue Plavix alone. He does not need to continue aspirin. TY!

## 2022-03-15 NOTE — Telephone Encounter (Signed)
Called the daughter and informed her that he does not need to continue taking asprin. He just needs to continue plavix. The daughter that was correct but was thankful for the confirmation

## 2022-03-16 NOTE — Progress Notes (Unsigned)
Cardiology Office Note   Date:  03/21/2022   ID:  Clarence Myers, DOB 1933-11-06, MRN [id number]  PCP:  Clarence Neer, MD  Cardiologist:   Clarence Dsouza Martinique, MD   No chief complaint on file.    History of Present Illness: Clarence Myers is a 88 y.o. male who is seen at the request of Dr Clarence Myers for evaluation of murmur.  He has a history of CAD, HLD. Remote angioplasty of the LAD in 1992. Last ETT was done in 2015. Had recent Echo done for murmur. Noted mild to moderate AS. Normal LV function. Mild Aortic enlargement.   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 Had right UE weakness and slurred speech. Managed with Plavix. Also had aspiration PNA. Readmitted in May. Presented with diffuse weakness. No focal deficits were noted.  MRI however showed subacute infarct in the posterior left frontal corona radiata. Repeat Echo in June 2023 showed some increase in AV gradient but in moderate range. Event monitor was benign. Statin dose was increased. Was treated with ASA and Brilinta for one month then Plavix was resumed.   He is seen today for follow up. Denies any chest pain, palpitations, dizziness or dyspnea. Still has right sided weakness. Notes shuffling gait. Getting PT twice a week. Notes voice is weak.     Past Medical History:  Diagnosis Date   CAD (coronary artery disease)    Hyperlipidemia    Low back strain    Shoulder injury    Fell and injured Right shoulder   Stroke [facility])     Past Surgical History:  Procedure Laterality Date   ANGIOPLASTY     1992-LAD   CARDIAC CATHETERIZATION       Current Outpatient Medications  Medication Sig Dispense Refill   acetaminophen (TYLENOL) 325 MG tablet Take 2 tablets (650 mg total) by mouth every 4 (four) hours as needed for mild pain (or temp > 37.5 C (99.5 F)).     clopidogrel (PLAVIX) 75 MG tablet Take 1 tablet (75 mg total) by mouth daily. RESUME ONLY AFTER COMPLETION OF 30 DAYS OF ASPIRIN AND BRILINTA      LUMIGAN 0.01 % SOLN Place 1 drop into both eyes at bedtime.     metoprolol tartrate (LOPRESSOR) 25 MG tablet Take 12.5 mg by mouth 2 (two) times daily.     mirtazapine (REMERON) 15 MG tablet Take 15 mg by mouth at bedtime.     Multiple Vitamin (MULTI-VITAMIN PO) Take by mouth daily.       multivitamin-lutein (OCUVITE-LUTEIN) CAPS Take 1 capsule by mouth daily.       Omega-3 Fatty Acids (FISH OIL PO) Take 1,000 mg by mouth in the morning and at bedtime.     omeprazole (PRILOSEC) 20 MG capsule Take 20 mg by mouth daily.     Probiotic Product (PROBIOTIC DAILY PO) Take 1 capsule by mouth daily.     RESTASIS 0.05 % ophthalmic emulsion Place 1 drop into both eyes 2 (two) times daily.     rosuvastatin (CRESTOR) 20 MG tablet Take 1 tablet (20 mg total) by mouth daily. 30 tablet 3   sertraline (ZOLOFT) 25 MG tablet Take 25 mg by mouth at bedtime.     doxycycline (VIBRA-TABS) 100 MG tablet Take 100 mg by mouth 2 (two) times daily.     No current facility-administered medications for this visit.    Allergies:   Blue crab (cavinectes sapidus) allergy skin test and Other  Social History:  The patient  reports that he has never smoked. He has never used smokeless tobacco.   Family History:  The patient's family history is not on file.    ROS:  Please see the history of present illness.   Otherwise, review of systems are positive for none.   All other systems are reviewed and negative.    PHYSICAL EXAM: VS:  BP 110/70 (BP Location: Left Arm, Patient Position: Sitting, Cuff Size: Normal)   Myers 65   Ht '5\' 6"'$  (1.676 m)   Wt 126 lb (57.2 kg)   SpO2 95%   BMI 20.34 kg/m  , BMI Body mass index is 20.34 kg/m. GEN: Well nourished, well developed, in no acute distress HEENT: normal Neck: no JVD, carotid bruits, or masses Cardiac: RRR; no murmurs, rubs, or gallops,no edema  Respiratory:  clear to auscultation bilaterally, normal work of breathing GI: soft, nontender, nondistended, + BS MS: no  deformity or atrophy Skin: warm and dry, no rash Neuro:  Strength and sensation are intact Psych: euthymic mood, full affect   EKG:  EKG is not ordered today.    Recent Labs: 10/07/2021: Magnesium 2.0 12/29/2021: ALT 14 12/31/2021: BUN 20; Creatinine, Ser 0.97; Hemoglobin 13.0; Platelets 283; Potassium 3.4; Sodium 141   Dat ed 04/20/20: cholesterol 159, triglycerides 67, HDL 53, LDL 92. A1c 6%. CMET normal  Lipid Panel    Component Value Date/Time   CHOL 153 12/30/2021 0431   TRIG 32 12/30/2021 0431   HDL 50 12/30/2021 0431   CHOLHDL 3.1 12/30/2021 0431   VLDL 6 12/30/2021 0431   LDLCALC 97 12/30/2021 0431      Wt Readings from Last 3 Encounters:  03/21/22 126 lb (57.2 kg)  03/13/22 126 lb (57.2 kg)  12/29/21 116 lb (52.6 kg)      Other studies Reviewed: Additional studies/ records that were reviewed today include:   Marland Kitchen Exercise Treadmill Test   Pre-Exercise Testing Evaluation NSR, normal ECG   Test   Exercise Tolerance Test Ordering MD: Clarence Dunnam Martinique, MD     Unique Test No: 1  Treadmill:  1  Indication for ETT: chest pain - rule out ischemia  Contraindication to ETT: No   Stress Modality: exercise - treadmill  Cardiac Imaging Performed: non   Protocol: standard Bruce - maximal  Max BP:  218/85  Max MPHR (bpm):  142 85% MPR (bpm):  120  MPHR obtained (bpm):  150 % MPHR obtained:  104  Reached 85% MPHR (min:sec):  5:30   Total Exercise Time (min-sec):  5  Workload in METS:  7.0 Borg Scale: 15  Reason ETT Terminated:  SOB and General Fatigue      ST Segment Analysis At Rest:           normal ST segments - no evidence of significant ST depression With Exercise: significant ischemic ST depression -2-3 mm in the inferior leads, 1 mm in lead V5   Other Information Arrhythmia:  PVCs and a ventricular couplet during exercise           Angina during ETT:  absent (0) Quality of ETT:  diagnostic   ETT Interpretation:  abnormal - evidence of ST depression consistent  with ischemia   Comments: Excellent exercise tolerance. Hypertensive response to exercise.   Recommendations: Consider further evaluation for CAD since the previous study was reported as normal in 2014. However, exercise capacity is unchanged from 2014 and the ST changes may be a post-CABG "false positive" abnormality.  Sanda Klein, MD, [facility] [phone] office [phone] pager   Echo 12/27/20: IMPRESSIONS     1. Left ventricular ejection fraction, by estimation, is 55 to 60%. Left  ventricular ejection fraction by 3D volume is 57 %. The left ventricle has  normal function. The left ventricle has no regional wall motion  abnormalities. There is mild left  ventricular hypertrophy. Left ventricular diastolic parameters are  consistent with Grade I diastolic dysfunction (impaired relaxation). The  average left ventricular global longitudinal strain is -23.8 %.   2. Right ventricular systolic function is normal. The right ventricular  size is normal. Tricuspid regurgitation signal is inadequate for assessing  PA pressure.   3. The mitral valve is normal in structure. No evidence of mitral valve  regurgitation.   4. The aortic valve is calcified. There is moderate calcification of the  aortic valve. Aortic valve regurgitation is not visualized. Mild to  moderate aortic valve stenosis. Vmax 2.4 m/s, MG 13 mmHg, AVA 1.1 cm^2, DI  0.33   5. Aortic dilatation noted. There is dilatation of the ascending aorta,  measuring 40 mm.   6. The inferior vena cava is normal in size with greater than 50%  respiratory variability, suggesting right atrial pressure of 3 mmHg.   Echo 02/09/22: IMPRESSIONS     1. Left ventricular ejection fraction, by estimation, is 55 to 60%. The  left ventricle has normal function. The left ventricle has no regional  wall motion abnormalities. Left ventricular diastolic parameters are  consistent with Grade I diastolic  dysfunction  (impaired relaxation).   2. Right ventricular systolic function is normal. The right ventricular  size is normal.   3. The mitral valve is normal in structure. Trivial mitral valve  regurgitation. No evidence of mitral stenosis.   4. The aortic valve is functionally bicuspid. The aortic valve is  bicuspid. There is moderate calcification of the aortic valve. Aortic  valve regurgitation is not visualized. Moderate to severe aortic valve  stenosis. Aortic valve area, by VTI measures  0.92 cm. Aortic valve mean gradient measures 17.0 mmHg. Aortic valve Vmax  measures 2.88 m/s.   5. The inferior vena cava is normal in size with greater than 50%  respiratory variability, suggesting right atrial pressure of 3 mmHg.  Event monitor 6/23: Study Highlights  Normal sinus rhythm Occasional isolated PACs Very rare PVCs No afib noted on 30 day recording  ASSESSMENT AND PLAN:  1.  CAD s/p remote POBA of the proximal LAD 31 years ago. Asymptomatic. Now on antiplatelet therapy with Plavix.  2. Moderate Aortic stenosis. Asymptomatic. Will repeat Echo in one year. 3. Mild aortic enlargement.  4. HLD on statin. Will update lipids and CMET on higher Crestor dose.  5. S/p CVA x 2. Event monitor negative for Afib. Continue antiplatelet therapy.    Current medicines are reviewed at length with the patient today.  The patient does not have concerns regarding medicines.  The following changes have been made:  none  Labs/ tests ordered today include:  No orders of the defined types were placed in this encounter.     Disposition:   FU with me  in  6 months  Signed, Jae Bruck Martinique, MD  03/21/2022 2:12 PM    [facility] [address] [place], [place], [address] Phone [phone], Fax [phone]

## 2022-03-21 ENCOUNTER — Encounter: Payer: Self-pay | Admitting: Cardiology

## 2022-03-21 ENCOUNTER — Ambulatory Visit (INDEPENDENT_AMBULATORY_CARE_PROVIDER_SITE_OTHER): Payer: PPO | Admitting: Cardiology

## 2022-03-21 VITALS — BP 110/70 | HR 65 | Ht 66.0 in | Wt 126.0 lb

## 2022-03-21 DIAGNOSIS — I639 Cerebral infarction, unspecified: Secondary | ICD-10-CM

## 2022-03-21 DIAGNOSIS — I350 Nonrheumatic aortic (valve) stenosis: Secondary | ICD-10-CM

## 2022-03-21 DIAGNOSIS — E7800 Pure hypercholesterolemia, unspecified: Secondary | ICD-10-CM

## 2022-03-21 DIAGNOSIS — I2510 Atherosclerotic heart disease of native coronary artery without angina pectoris: Secondary | ICD-10-CM

## 2022-03-22 LAB — HEPATIC FUNCTION PANEL
ALT: 14 IU/L (ref 0–44)
AST: 21 IU/L (ref 0–40)
Albumin: 4.1 g/dL (ref 3.7–4.7)
Alkaline Phosphatase: 90 IU/L (ref 44–121)
Bilirubin Total: 0.2 mg/dL (ref 0.0–1.2)
Bilirubin, Direct: <0.1 mg/dL (ref 0.00–0.40)
Total Protein: 7.1 g/dL (ref 6.0–8.5)

## 2022-03-22 LAB — BASIC METABOLIC PANEL
BUN/Creatinine Ratio: 24 (ref 10–24)
BUN: 28 mg/dL — ABNORMAL HIGH (ref 8–27)
CO2: 21 mmol/L (ref 20–29)
Calcium: 9.5 mg/dL (ref 8.6–10.2)
Chloride: 104 mmol/L (ref 96–106)
Creatinine, Ser: 1.18 mg/dL (ref 0.76–1.27)
Glucose: 107 mg/dL — ABNORMAL HIGH (ref 70–99)
Potassium: 4.2 mmol/L (ref 3.5–5.2)
Sodium: 143 mmol/L (ref 134–144)
eGFR: 60 mL/min/{1.73_m2} (ref >59)

## 2022-03-22 LAB — LIPID PANEL
Chol/HDL Ratio: 3.4 ratio (ref 0.0–5.0)
Cholesterol, Total: 156 mg/dL (ref 100–199)
HDL: 46 mg/dL (ref >39)
LDL Chol Calc (NIH): 77 mg/dL (ref 0–99)
Triglycerides: 195 mg/dL — ABNORMAL HIGH (ref 0–149)
VLDL Cholesterol Cal: 33 mg/dL (ref 5–40)

## 2022-03-23 ENCOUNTER — Ambulatory Visit: Payer: PPO | Admitting: Podiatry

## 2022-03-23 DIAGNOSIS — L603 Nail dystrophy: Secondary | ICD-10-CM

## 2022-03-27 DIAGNOSIS — Z7902 Long term (current) use of antithrombotics/antiplatelets: Secondary | ICD-10-CM | POA: Diagnosis not present

## 2022-03-27 DIAGNOSIS — R5383 Other fatigue: Secondary | ICD-10-CM | POA: Diagnosis not present

## 2022-03-27 DIAGNOSIS — E785 Hyperlipidemia, unspecified: Secondary | ICD-10-CM | POA: Diagnosis not present

## 2022-03-27 DIAGNOSIS — F39 Unspecified mood [affective] disorder: Secondary | ICD-10-CM | POA: Diagnosis not present

## 2022-03-27 DIAGNOSIS — I10 Essential (primary) hypertension: Secondary | ICD-10-CM | POA: Diagnosis not present

## 2022-03-27 DIAGNOSIS — I69351 Hemiplegia and hemiparesis following cerebral infarction affecting right dominant side: Secondary | ICD-10-CM | POA: Diagnosis not present

## 2022-03-27 DIAGNOSIS — E46 Unspecified protein-calorie malnutrition: Secondary | ICD-10-CM | POA: Diagnosis not present

## 2022-03-29 DIAGNOSIS — R2681 Unsteadiness on feet: Secondary | ICD-10-CM | POA: Diagnosis not present

## 2022-03-29 DIAGNOSIS — G8101 Flaccid hemiplegia affecting right dominant side: Secondary | ICD-10-CM | POA: Diagnosis not present

## 2022-03-29 DIAGNOSIS — M6281 Muscle weakness (generalized): Secondary | ICD-10-CM | POA: Diagnosis not present

## 2022-03-29 DIAGNOSIS — I639 Cerebral infarction, unspecified: Secondary | ICD-10-CM | POA: Diagnosis not present

## 2022-03-29 NOTE — Progress Notes (Signed)
Subjective:  Patient ID: Clarence Myers, male    DOB: [birth year],  MRN: [id number]  Chief Complaint  Patient presents with   Ingrown Toenail    88 y.o. male presents with the above complaint.  Patient presents with complaint of left hallux nail dystrophy with underlying ingrown.  Patient states pain for touch is progressive gotten worse.  He would like to have it removed.  He has not seen anyone else prior to seeing me.  He does not want to make it permanent.  Pain scale 7 out of 10.  Hurts with ambulation hurts with pressure.   Review of Systems: Negative except as noted in the HPI. Denies N/V/F/Ch.  Past Medical History:  Diagnosis Date   CAD (coronary artery disease)    Hyperlipidemia    Low back strain    Shoulder injury    Fell and injured Right shoulder   Stroke [facility])     Current Outpatient Medications:    acetaminophen (TYLENOL) 325 MG tablet, Take 2 tablets (650 mg total) by mouth every 4 (four) hours as needed for mild pain (or temp > 37.5 C (99.5 F))., Disp: , Rfl:    clopidogrel (PLAVIX) 75 MG tablet, Take 1 tablet (75 mg total) by mouth daily. RESUME ONLY AFTER COMPLETION OF 30 DAYS OF ASPIRIN AND BRILINTA, Disp: , Rfl:    doxycycline (VIBRA-TABS) 100 MG tablet, Take 100 mg by mouth 2 (two) times daily., Disp: , Rfl:    LUMIGAN 0.01 % SOLN, Place 1 drop into both eyes at bedtime., Disp: , Rfl:    metoprolol tartrate (LOPRESSOR) 25 MG tablet, Take 12.5 mg by mouth 2 (two) times daily., Disp: , Rfl:    mirtazapine (REMERON) 15 MG tablet, Take 15 mg by mouth at bedtime., Disp: , Rfl:    Multiple Vitamin (MULTI-VITAMIN PO), Take by mouth daily.  , Disp: , Rfl:    multivitamin-lutein (OCUVITE-LUTEIN) CAPS, Take 1 capsule by mouth daily.  , Disp: , Rfl:    Omega-3 Fatty Acids (FISH OIL PO), Take 1,000 mg by mouth in the morning and at bedtime., Disp: , Rfl:    omeprazole (PRILOSEC) 20 MG capsule, Take 20 mg by mouth daily., Disp: , Rfl:    Probiotic Product (PROBIOTIC  DAILY PO), Take 1 capsule by mouth daily., Disp: , Rfl:    RESTASIS 0.05 % ophthalmic emulsion, Place 1 drop into both eyes 2 (two) times daily., Disp: , Rfl:    rosuvastatin (CRESTOR) 20 MG tablet, Take 1 tablet (20 mg total) by mouth daily., Disp: 30 tablet, Rfl: 3   sertraline (ZOLOFT) 25 MG tablet, Take 25 mg by mouth at bedtime., Disp: , Rfl:   Social History   Tobacco Use  Smoking Status Never  Smokeless Tobacco Never    Allergies  Allergen Reactions   Blue Crab (Cavinectes Sapidus) Allergy Skin Test     "Hives"    Other     Bee Stings   Objective:  There were no vitals filed for this visit. There is no height or weight on file to calculate BMI. Constitutional Well developed. Well nourished.  Vascular Dorsalis pedis pulses palpable bilaterally. Posterior tibial pulses palpable bilaterally. Capillary refill normal to all digits.  No cyanosis or clubbing noted. Pedal hair growth normal.  Neurologic Normal speech. Oriented to person, place, and time. Epicritic sensation to light touch grossly present bilaterally.  Dermatologic Pain on palpation of the entire/total nail on 1st digit of the left No other open wounds. No skin  lesions.  Orthopedic: Normal joint ROM without pain or crepitus bilaterally. No visible deformities. No bony tenderness.   Radiographs: None Assessment:   1. Nail dystrophy    Plan:  Patient was evaluated and treated and all questions answered.  Nail contusion/dystrophy hallux, left -Patient elects to proceed with minor surgery to remove entire toenail today. Consent reviewed and signed by patient. -Entire/total nail excised. See procedure note. -Educated on post-procedure care including soaking. Written instructions provided and reviewed. -Patient to follow up in 2 weeks for nail check.  Procedure: Excision of entire/total nail  Location: Left 1st toe digit Anesthesia: Lidocaine 1% plain; 1.5 mL and Marcaine 0.5% plain; 1.5 mL, digital  block. Skin Prep: Betadine. Dressing: Silvadene; telfa; dry, sterile, compression dressing. Technique: Following skin prep, the toe was exsanguinated and a tourniquet was secured at the base of the toe. The affected nail border was freed and excised. The tourniquet was then removed and sterile dressing applied. Disposition: Patient tolerated procedure well. Patient to return in 2 weeks for follow-up.   No follow-ups on file.

## 2022-04-07 DIAGNOSIS — R5383 Other fatigue: Secondary | ICD-10-CM | POA: Diagnosis not present

## 2022-04-07 DIAGNOSIS — E46 Unspecified protein-calorie malnutrition: Secondary | ICD-10-CM | POA: Diagnosis not present

## 2022-04-07 DIAGNOSIS — I69351 Hemiplegia and hemiparesis following cerebral infarction affecting right dominant side: Secondary | ICD-10-CM | POA: Diagnosis not present

## 2022-04-07 DIAGNOSIS — Z7902 Long term (current) use of antithrombotics/antiplatelets: Secondary | ICD-10-CM | POA: Diagnosis not present

## 2022-04-07 DIAGNOSIS — I10 Essential (primary) hypertension: Secondary | ICD-10-CM | POA: Diagnosis not present

## 2022-04-07 DIAGNOSIS — F39 Unspecified mood [affective] disorder: Secondary | ICD-10-CM | POA: Diagnosis not present

## 2022-04-07 DIAGNOSIS — E785 Hyperlipidemia, unspecified: Secondary | ICD-10-CM | POA: Diagnosis not present

## 2022-04-12 DIAGNOSIS — I639 Cerebral infarction, unspecified: Secondary | ICD-10-CM | POA: Diagnosis not present

## 2022-04-12 DIAGNOSIS — I2510 Atherosclerotic heart disease of native coronary artery without angina pectoris: Secondary | ICD-10-CM | POA: Diagnosis not present

## 2022-04-24 DIAGNOSIS — Z7902 Long term (current) use of antithrombotics/antiplatelets: Secondary | ICD-10-CM | POA: Diagnosis not present

## 2022-04-24 DIAGNOSIS — R5383 Other fatigue: Secondary | ICD-10-CM | POA: Diagnosis not present

## 2022-04-24 DIAGNOSIS — E46 Unspecified protein-calorie malnutrition: Secondary | ICD-10-CM | POA: Diagnosis not present

## 2022-04-24 DIAGNOSIS — I69351 Hemiplegia and hemiparesis following cerebral infarction affecting right dominant side: Secondary | ICD-10-CM | POA: Diagnosis not present

## 2022-04-24 DIAGNOSIS — E785 Hyperlipidemia, unspecified: Secondary | ICD-10-CM | POA: Diagnosis not present

## 2022-04-24 DIAGNOSIS — F39 Unspecified mood [affective] disorder: Secondary | ICD-10-CM | POA: Diagnosis not present

## 2022-04-24 DIAGNOSIS — I10 Essential (primary) hypertension: Secondary | ICD-10-CM | POA: Diagnosis not present

## 2022-04-26 ENCOUNTER — Ambulatory Visit: Payer: PPO | Admitting: Cardiology

## 2022-04-29 DIAGNOSIS — I639 Cerebral infarction, unspecified: Secondary | ICD-10-CM | POA: Diagnosis not present

## 2022-04-29 DIAGNOSIS — R2681 Unsteadiness on feet: Secondary | ICD-10-CM | POA: Diagnosis not present

## 2022-04-29 DIAGNOSIS — G8101 Flaccid hemiplegia affecting right dominant side: Secondary | ICD-10-CM | POA: Diagnosis not present

## 2022-04-29 DIAGNOSIS — M6281 Muscle weakness (generalized): Secondary | ICD-10-CM | POA: Diagnosis not present

## 2022-05-02 DIAGNOSIS — I350 Nonrheumatic aortic (valve) stenosis: Secondary | ICD-10-CM | POA: Diagnosis not present

## 2022-05-02 DIAGNOSIS — F39 Unspecified mood [affective] disorder: Secondary | ICD-10-CM | POA: Diagnosis not present

## 2022-05-02 DIAGNOSIS — R208 Other disturbances of skin sensation: Secondary | ICD-10-CM | POA: Diagnosis not present

## 2022-05-02 DIAGNOSIS — I69351 Hemiplegia and hemiparesis following cerebral infarction affecting right dominant side: Secondary | ICD-10-CM | POA: Diagnosis not present

## 2022-05-02 DIAGNOSIS — Z0000 Encounter for general adult medical examination without abnormal findings: Secondary | ICD-10-CM | POA: Diagnosis not present

## 2022-05-02 DIAGNOSIS — Z23 Encounter for immunization: Secondary | ICD-10-CM | POA: Diagnosis not present

## 2022-05-02 DIAGNOSIS — R7301 Impaired fasting glucose: Secondary | ICD-10-CM | POA: Diagnosis not present

## 2022-05-02 DIAGNOSIS — I119 Hypertensive heart disease without heart failure: Secondary | ICD-10-CM | POA: Diagnosis not present

## 2022-05-02 DIAGNOSIS — I679 Cerebrovascular disease, unspecified: Secondary | ICD-10-CM | POA: Diagnosis not present

## 2022-05-02 DIAGNOSIS — K5730 Diverticulosis of large intestine without perforation or abscess without bleeding: Secondary | ICD-10-CM | POA: Diagnosis not present

## 2022-05-02 DIAGNOSIS — E782 Mixed hyperlipidemia: Secondary | ICD-10-CM | POA: Diagnosis not present

## 2022-05-02 DIAGNOSIS — I25119 Atherosclerotic heart disease of native coronary artery with unspecified angina pectoris: Secondary | ICD-10-CM | POA: Diagnosis not present

## 2022-05-10 DIAGNOSIS — R5383 Other fatigue: Secondary | ICD-10-CM | POA: Diagnosis not present

## 2022-05-10 DIAGNOSIS — R350 Frequency of micturition: Secondary | ICD-10-CM | POA: Diagnosis not present

## 2022-05-13 DIAGNOSIS — I2510 Atherosclerotic heart disease of native coronary artery without angina pectoris: Secondary | ICD-10-CM | POA: Diagnosis not present

## 2022-05-13 DIAGNOSIS — I639 Cerebral infarction, unspecified: Secondary | ICD-10-CM | POA: Diagnosis not present

## 2022-05-22 DIAGNOSIS — R5383 Other fatigue: Secondary | ICD-10-CM | POA: Diagnosis not present

## 2022-05-22 DIAGNOSIS — E785 Hyperlipidemia, unspecified: Secondary | ICD-10-CM | POA: Diagnosis not present

## 2022-05-22 DIAGNOSIS — Z7902 Long term (current) use of antithrombotics/antiplatelets: Secondary | ICD-10-CM | POA: Diagnosis not present

## 2022-05-22 DIAGNOSIS — I69351 Hemiplegia and hemiparesis following cerebral infarction affecting right dominant side: Secondary | ICD-10-CM | POA: Diagnosis not present

## 2022-05-22 DIAGNOSIS — I10 Essential (primary) hypertension: Secondary | ICD-10-CM | POA: Diagnosis not present

## 2022-05-22 DIAGNOSIS — F39 Unspecified mood [affective] disorder: Secondary | ICD-10-CM | POA: Diagnosis not present

## 2022-05-22 DIAGNOSIS — E46 Unspecified protein-calorie malnutrition: Secondary | ICD-10-CM | POA: Diagnosis not present

## 2022-05-29 DIAGNOSIS — R2681 Unsteadiness on feet: Secondary | ICD-10-CM | POA: Diagnosis not present

## 2022-05-29 DIAGNOSIS — M6281 Muscle weakness (generalized): Secondary | ICD-10-CM | POA: Diagnosis not present

## 2022-05-29 DIAGNOSIS — G8101 Flaccid hemiplegia affecting right dominant side: Secondary | ICD-10-CM | POA: Diagnosis not present

## 2022-05-29 DIAGNOSIS — I639 Cerebral infarction, unspecified: Secondary | ICD-10-CM | POA: Diagnosis not present

## 2022-06-06 DIAGNOSIS — F39 Unspecified mood [affective] disorder: Secondary | ICD-10-CM | POA: Diagnosis not present

## 2022-06-06 DIAGNOSIS — E785 Hyperlipidemia, unspecified: Secondary | ICD-10-CM | POA: Diagnosis not present

## 2022-06-06 DIAGNOSIS — I10 Essential (primary) hypertension: Secondary | ICD-10-CM | POA: Diagnosis not present

## 2022-06-06 DIAGNOSIS — E46 Unspecified protein-calorie malnutrition: Secondary | ICD-10-CM | POA: Diagnosis not present

## 2022-06-06 DIAGNOSIS — Z7902 Long term (current) use of antithrombotics/antiplatelets: Secondary | ICD-10-CM | POA: Diagnosis not present

## 2022-06-06 DIAGNOSIS — I69351 Hemiplegia and hemiparesis following cerebral infarction affecting right dominant side: Secondary | ICD-10-CM | POA: Diagnosis not present

## 2022-06-06 DIAGNOSIS — R5383 Other fatigue: Secondary | ICD-10-CM | POA: Diagnosis not present

## 2022-06-08 DIAGNOSIS — H401190 Primary open-angle glaucoma, unspecified eye, stage unspecified: Secondary | ICD-10-CM | POA: Diagnosis not present

## 2022-06-12 DIAGNOSIS — I639 Cerebral infarction, unspecified: Secondary | ICD-10-CM | POA: Diagnosis not present

## 2022-06-12 DIAGNOSIS — I2510 Atherosclerotic heart disease of native coronary artery without angina pectoris: Secondary | ICD-10-CM | POA: Diagnosis not present

## 2022-06-13 DIAGNOSIS — G5601 Carpal tunnel syndrome, right upper limb: Secondary | ICD-10-CM | POA: Diagnosis not present

## 2022-06-13 DIAGNOSIS — G5623 Lesion of ulnar nerve, bilateral upper limbs: Secondary | ICD-10-CM | POA: Diagnosis not present

## 2022-06-21 DIAGNOSIS — I69351 Hemiplegia and hemiparesis following cerebral infarction affecting right dominant side: Secondary | ICD-10-CM | POA: Diagnosis not present

## 2022-06-21 DIAGNOSIS — F39 Unspecified mood [affective] disorder: Secondary | ICD-10-CM | POA: Diagnosis not present

## 2022-06-21 DIAGNOSIS — E46 Unspecified protein-calorie malnutrition: Secondary | ICD-10-CM | POA: Diagnosis not present

## 2022-06-21 DIAGNOSIS — Z7902 Long term (current) use of antithrombotics/antiplatelets: Secondary | ICD-10-CM | POA: Diagnosis not present

## 2022-06-21 DIAGNOSIS — I10 Essential (primary) hypertension: Secondary | ICD-10-CM | POA: Diagnosis not present

## 2022-06-21 DIAGNOSIS — E785 Hyperlipidemia, unspecified: Secondary | ICD-10-CM | POA: Diagnosis not present

## 2022-06-21 DIAGNOSIS — R5383 Other fatigue: Secondary | ICD-10-CM | POA: Diagnosis not present

## 2022-06-29 DIAGNOSIS — I639 Cerebral infarction, unspecified: Secondary | ICD-10-CM | POA: Diagnosis not present

## 2022-06-29 DIAGNOSIS — M6281 Muscle weakness (generalized): Secondary | ICD-10-CM | POA: Diagnosis not present

## 2022-06-29 DIAGNOSIS — R2681 Unsteadiness on feet: Secondary | ICD-10-CM | POA: Diagnosis not present

## 2022-06-29 DIAGNOSIS — G8101 Flaccid hemiplegia affecting right dominant side: Secondary | ICD-10-CM | POA: Diagnosis not present

## 2022-07-03 DIAGNOSIS — G5603 Carpal tunnel syndrome, bilateral upper limbs: Secondary | ICD-10-CM | POA: Diagnosis not present

## 2022-07-03 DIAGNOSIS — G5602 Carpal tunnel syndrome, left upper limb: Secondary | ICD-10-CM | POA: Diagnosis not present

## 2022-07-13 DIAGNOSIS — I639 Cerebral infarction, unspecified: Secondary | ICD-10-CM | POA: Diagnosis not present

## 2022-07-13 DIAGNOSIS — I2510 Atherosclerotic heart disease of native coronary artery without angina pectoris: Secondary | ICD-10-CM | POA: Diagnosis not present

## 2022-07-17 DIAGNOSIS — W19XXXA Unspecified fall, initial encounter: Secondary | ICD-10-CM | POA: Diagnosis not present

## 2022-07-17 DIAGNOSIS — S2241XA Multiple fractures of ribs, right side, initial encounter for closed fracture: Secondary | ICD-10-CM | POA: Diagnosis not present

## 2022-07-27 DIAGNOSIS — I69351 Hemiplegia and hemiparesis following cerebral infarction affecting right dominant side: Secondary | ICD-10-CM | POA: Diagnosis not present

## 2022-07-27 DIAGNOSIS — S2239XA Fracture of one rib, unspecified side, initial encounter for closed fracture: Secondary | ICD-10-CM | POA: Diagnosis not present

## 2022-07-29 DIAGNOSIS — G8101 Flaccid hemiplegia affecting right dominant side: Secondary | ICD-10-CM | POA: Diagnosis not present

## 2022-07-29 DIAGNOSIS — I639 Cerebral infarction, unspecified: Secondary | ICD-10-CM | POA: Diagnosis not present

## 2022-07-29 DIAGNOSIS — M6281 Muscle weakness (generalized): Secondary | ICD-10-CM | POA: Diagnosis not present

## 2022-07-29 DIAGNOSIS — R2681 Unsteadiness on feet: Secondary | ICD-10-CM | POA: Diagnosis not present

## 2022-08-12 DIAGNOSIS — I639 Cerebral infarction, unspecified: Secondary | ICD-10-CM | POA: Diagnosis not present

## 2022-08-12 DIAGNOSIS — I2510 Atherosclerotic heart disease of native coronary artery without angina pectoris: Secondary | ICD-10-CM | POA: Diagnosis not present

## 2022-08-29 DIAGNOSIS — G8101 Flaccid hemiplegia affecting right dominant side: Secondary | ICD-10-CM | POA: Diagnosis not present

## 2022-08-29 DIAGNOSIS — M6281 Muscle weakness (generalized): Secondary | ICD-10-CM | POA: Diagnosis not present

## 2022-08-29 DIAGNOSIS — I639 Cerebral infarction, unspecified: Secondary | ICD-10-CM | POA: Diagnosis not present

## 2022-08-29 DIAGNOSIS — R2681 Unsteadiness on feet: Secondary | ICD-10-CM | POA: Diagnosis not present

## 2022-09-05 DIAGNOSIS — R5383 Other fatigue: Secondary | ICD-10-CM | POA: Diagnosis not present

## 2022-09-05 DIAGNOSIS — H409 Unspecified glaucoma: Secondary | ICD-10-CM | POA: Diagnosis not present

## 2022-09-05 DIAGNOSIS — G5600 Carpal tunnel syndrome, unspecified upper limb: Secondary | ICD-10-CM | POA: Diagnosis not present

## 2022-09-05 DIAGNOSIS — I69351 Hemiplegia and hemiparesis following cerebral infarction affecting right dominant side: Secondary | ICD-10-CM | POA: Diagnosis not present

## 2022-09-06 DIAGNOSIS — R5383 Other fatigue: Secondary | ICD-10-CM | POA: Diagnosis not present

## 2022-09-10 DIAGNOSIS — E46 Unspecified protein-calorie malnutrition: Secondary | ICD-10-CM | POA: Diagnosis not present

## 2022-09-10 DIAGNOSIS — F39 Unspecified mood [affective] disorder: Secondary | ICD-10-CM | POA: Diagnosis not present

## 2022-09-10 DIAGNOSIS — R208 Other disturbances of skin sensation: Secondary | ICD-10-CM | POA: Diagnosis not present

## 2022-09-10 DIAGNOSIS — I712 Thoracic aortic aneurysm, without rupture, unspecified: Secondary | ICD-10-CM | POA: Diagnosis not present

## 2022-09-10 DIAGNOSIS — I69351 Hemiplegia and hemiparesis following cerebral infarction affecting right dominant side: Secondary | ICD-10-CM | POA: Diagnosis not present

## 2022-09-10 DIAGNOSIS — R270 Ataxia, unspecified: Secondary | ICD-10-CM | POA: Diagnosis not present

## 2022-09-10 DIAGNOSIS — S2241XD Multiple fractures of ribs, right side, subsequent encounter for fracture with routine healing: Secondary | ICD-10-CM | POA: Diagnosis not present

## 2022-09-10 DIAGNOSIS — E7800 Pure hypercholesterolemia, unspecified: Secondary | ICD-10-CM | POA: Diagnosis not present

## 2022-09-10 DIAGNOSIS — I5030 Unspecified diastolic (congestive) heart failure: Secondary | ICD-10-CM | POA: Diagnosis not present

## 2022-09-10 DIAGNOSIS — I25119 Atherosclerotic heart disease of native coronary artery with unspecified angina pectoris: Secondary | ICD-10-CM | POA: Diagnosis not present

## 2022-09-10 DIAGNOSIS — I350 Nonrheumatic aortic (valve) stenosis: Secondary | ICD-10-CM | POA: Diagnosis not present

## 2022-09-10 DIAGNOSIS — I110 Hypertensive heart disease with heart failure: Secondary | ICD-10-CM | POA: Diagnosis not present

## 2022-09-12 DIAGNOSIS — I2510 Atherosclerotic heart disease of native coronary artery without angina pectoris: Secondary | ICD-10-CM | POA: Diagnosis not present

## 2022-09-12 DIAGNOSIS — I639 Cerebral infarction, unspecified: Secondary | ICD-10-CM | POA: Diagnosis not present

## 2022-09-13 NOTE — Progress Notes (Signed)
[facility] [address] [place]. [place] [address] [phone]       HOSPITAL FOLLOW UP NOTE  [name] Date of Birth:  1934/07/07 Medical Record Number:  [id number]   Reason for Referral:  hospital stroke follow up    SUBJECTIVE:   CHIEF COMPLAINT:  Chief Complaint  Patient presents with   Follow-up    Patient in room 1,with daughter for follow up acute ischemic stoke. Patient is in wheelchair, has caregiver, daughter reports patient is weaker. Patient fell in early December, went to urgent care, cracked 2 ribs. Right side is the weakness side.  Patient currently on Macrobid for UTI only 1 day left of Rx.    HPI:   Clarence Myers is a 89 y.o. who  has a past medical history of CAD (coronary artery disease), Hyperlipidemia, Low back strain, Shoulder injury, and Stroke ([place]).  Patient presented on 12/29/2021 with acute diffuse weakness upon waking that morning. He was having a difficult time standing up. MRI showed 17m focus of diffusion in posterior left frontal corona radiata, likely small evolving subacute ischemic infarct. He was admitted for previous CVA in 09/2021 with residual right upper ext weakness. He was discharged home on Plavix at that time. With most recent CVA 12/29/2021, [place] was [place]. After 1 month he was to resume Plavix and discontinue asa '81mg'$ . PT/OT recommended outpatient therapy. Personally reviewed hospitalization pertinent progress notes, lab work and imaging.  Evaluated by [name]   Since being back home, he is doing fairly well. He did complete 30 day heart monitor. Per his daughter, no abnormal rhythm noted. He has follow up next month and will discuss possible loop recorder. He denies chest pain or shob. He continues to work with PT/OT. He feels that he is getting a little stronger as time passes. He does continues to have R>L weakness. He is ambulating with a walker. He did not bring walker with  him, today. He feels that mood is ok. He is sleeping well. He continues Plavix. He is taking rosuvastatin as prescribed. BP is well managed on metoprolol 12.'5mg'$  BID.   UPDATE 09/17/2022 ALL: [name] for follow up post CVA 09/2021 and 12/2021. He was last seen 02/2022 and doing fairly well. He was walking with a walker. He presents with his daughter, today, who assists with HPI. He seemed to be doing fairly well over the summer but had a fall about a month ago resulting in 2 broken ribs. Since, he has had more difficulty with lower ext weakness. He is working with PT in the home twice a week. He is able to walk short distances with walker. He is able to complete ADLs with minimal assistance. He is eating normally and sleeps well. May be sleeping a little more than normal. He denies concerns of depression. He has a caregiver that checks on him daily. He lives with his wife who is in poor health. He continues Plavix and rosuvastatin. He has close follow up with PCP and cardiology.    PERTINENT IMAGING/LABS  MRI left CR small infarct CTA head and neck right PCA chronic occlusion, left VA, bilateral siphon and bilateral ICA bulb atherosclerosis 2D Echo EF 65 to 70% in 09/2021 30 day monitor negative for afib  consider loop recorder   A1C Lab Results  Component Value Date   HGBA1C 5.6 12/31/2021    Lipid Panel     Component Value Date/Time   CHOL  156 03/21/2022 1423   TRIG 195 (H) 03/21/2022 1423   HDL 46 03/21/2022 1423   CHOLHDL 3.4 03/21/2022 1423   CHOLHDL 3.1 12/30/2021 0431   VLDL 6 12/30/2021 0431   LDLCALC 77 03/21/2022 1423   LABVLDL 33 03/21/2022 1423    ROS:   14 system review of systems performed and negative with exception of those listed in HPI  PMH:  Past Medical History:  Diagnosis Date   CAD (coronary artery disease)    Hyperlipidemia    Low back strain    Shoulder injury    Fell and injured Right shoulder   Stroke ([place])     PSH:  Past Surgical  History:  Procedure Laterality Date   ANGIOPLASTY     1992-LAD   CARDIAC CATHETERIZATION      Social History:  Social History   Socioeconomic History   Marital status: Married    Spouse name: Not on file   Number of children: Not on file   Years of education: Not on file   Highest education level: Not on file  Occupational History   Not on file  Tobacco Use   Smoking status: Never   Smokeless tobacco: Never  Substance and Sexual Activity   Alcohol use: Not on file   Drug use: Not on file   Sexual activity: Not on file  Other Topics Concern   Not on file  Social History Narrative   Not on file   Social Determinants of Health   Financial Resource Strain: Not on file  Food Insecurity: Not on file  Transportation Needs: Not on file  Physical Activity: Not on file  Stress: Not on file  Social Connections: Not on file  Intimate Partner Violence: Not on file    Family History: History reviewed. No pertinent family history.  Medications:   Current Outpatient Medications on File Prior to Visit  Medication Sig Dispense Refill   acetaminophen (TYLENOL) 325 MG tablet Take 2 tablets (650 mg total) by mouth every 4 (four) hours as needed for mild pain (or temp > 37.5 C (99.5 F)).     benzonatate (TESSALON) 100 MG capsule Take 100 mg by mouth at bedtime. Take 2 capsule at night     clopidogrel (PLAVIX) 75 MG tablet Take 1 tablet (75 mg total) by mouth daily. RESUME ONLY AFTER COMPLETION OF 30 DAYS OF ASPIRIN AND BRILINTA     metoprolol tartrate (LOPRESSOR) 25 MG tablet Take 12.5 mg by mouth 2 (two) times daily.     mirtazapine (REMERON) 15 MG tablet Take 15 mg by mouth at bedtime.     Multiple Vitamin (MULTI-VITAMIN PO) Take by mouth daily.       multivitamin-lutein (OCUVITE-LUTEIN) CAPS Take 1 capsule by mouth daily.       Omega-3 Fatty Acids (FISH OIL PO) Take 1,000 mg by mouth in the morning and at bedtime.     omeprazole (PRILOSEC) 20 MG capsule Take 20 mg by mouth daily.      Probiotic Product (PROBIOTIC DAILY PO) Take 1 capsule by mouth daily.     RESTASIS 0.05 % ophthalmic emulsion Place 1 drop into both eyes 2 (two) times daily.     rosuvastatin (CRESTOR) 20 MG tablet Take 1 tablet (20 mg total) by mouth daily. 30 tablet 3   sertraline (ZOLOFT) 25 MG tablet Take 25 mg by mouth at bedtime.     doxycycline (VIBRA-TABS) 100 MG tablet Take 100 mg by mouth 2 (two) times daily. (Patient not  taking: Reported on 09/17/2022)     LUMIGAN 0.01 % SOLN Place 1 drop into both eyes at bedtime. (Patient not taking: Reported on 09/17/2022)     No current facility-administered medications on file prior to visit.    Allergies:   Allergies  Allergen Reactions   Blue Crab (Cavinectes Sapidus) Allergy Skin Test     "Hives"    Other     Bee Stings     OBJECTIVE:  Physical Exam  Vitals:   09/17/22 1413  BP: 131/68  Pulse: 68  Weight: 143 lb (64.9 kg)  Height: '5\' 7"'$  (1.702 m)    Body mass index is 22.4 kg/m. No results found.      No data to display           General: well developed, well nourished, seated, in no evident distress Head: head normocephalic and atraumatic.   Neck: supple with no carotid or supraclavicular bruits Cardiovascular: regular rate and rhythm, occasional PVC, systolic murmur noted  Musculoskeletal: no deformity Skin:  no rash/petichiae Vascular:  Normal pulses all extremities   Neurologic Exam Mental Status: Awake and fully alert.  Fluent speech and language.  Oriented to place and time. Recent and remote memory intact. Attention span, concentration and fund of knowledge appropriate. Mood and affect appropriate.  Cranial Nerves: Fundoscopic exam reveals sharp disc margins. Pupils equal, briskly reactive to light. Extraocular movements full without nystagmus. Visual fields full to confrontation. Hearing intact. Facial sensation intact. Face, tongue, palate moves normally and symmetrically.  Motor: Normal bulk and tone. Normal  strength in all tested extremity muscles with exception of very slight weakness notes in right upper and lower ext (4+/5) Sensory.: intact to touch , pinprick , position and vibratory sensation.  Coordination: Rapid alternating movements normal in all extremities. Finger-to-nose and heel-to-shin performed accurately bilaterally. Gait and Station: Patient presents in wheelchair, today. He is able to push to standing position. He does not have walker and does not feel he can safety ambulate without walker. He took one step forward then sat back in wheelchair.  Reflexes: 1+ and symmetric   NIHSS  2 Modified Rankin  0    ASSESSMENT: Clarence Myers is a 89 y.o. year old male presenting to the ER 12/29/2021 with generalized weakness. Vascular risk factors include HTN, HLD, advanced age, previous CVA.    PLAN:  Left CR small infarct likely secondary to small vessel disease by location.  However, given prior embolic stroke and frequent PVCs on telemetry, concerning for cardioembolic source also: Residual deficit: right arm weakness from CVA in 09/2021. Continue Plavix and rosuvastatin '20mg'$  and Zocor '40mg'$  for secondary stroke prevention.  Discussed secondary stroke prevention measures and importance of close PCP follow up for aggressive stroke risk factor management. I have gone over the pathophysiology of stroke, warning signs and symptoms, risk factors and their management in some detail with instructions to go to the closest emergency room for symptoms of concern. HTN: BP goal <130/90.  Stable on metoprolol 12.'5mg'$  BID per PCP HLD: LDL goal <70. Recent LDL 97. Continue rosuvastatin '20mg'$  per PCP.  Right sided weakness: continue to work with PT/OT. Use walker at all time to prevent falls. Consider neuro PT if interested. Fall precautions.    Follow up in 6 months or call earlier if needed   CC:  [place] provider: Dr. Leonie Man PCP: Mayra Neer, MD    I spent 45 minutes of face-to-face and  non-face-to-face time with patient.  This included previsit chart  review including review of recent hospitalization, lab review, study review, order entry, electronic health record documentation, patient education regarding recent stroke including etiology, secondary stroke prevention measures and importance of managing stroke risk factors, residual deficits and typical recovery time and answered all other questions to patient satisfaction   Debbora Presto, [facility]  [facility] [address] [place],  [address]  Phone [phone] Fax [phone] Note: This document was prepared with digital dictation and possible smart phrase technology. Any transcriptional errors that result from this process are unintentional.

## 2022-09-13 NOTE — Patient Instructions (Signed)
Below is our plan:  Left CR small infarct likely secondary to small vessel disease by location.  However, given prior embolic stroke and frequent PVCs on telemetry, concerning for cardioembolic source also: Residual deficit: right arm weakness from CVA in 09/2021. Continue Plavix and rosuvastatin '20mg'$  and Zocor '40mg'$  for secondary stroke prevention.  Discussed secondary stroke prevention measures and importance of close PCP follow up for aggressive stroke risk factor management. I have gone over the pathophysiology of stroke, warning signs and symptoms, risk factors and their management in some detail with instructions to go to the closest emergency room for symptoms of concern. HTN: BP goal <130/90.  Stable on metoprolol 12.'5mg'$  BID per PCP HLD: LDL goal <70. Recent LDL 97. Continue rosuvastatin '20mg'$  per PCP.  Right sided weakness: continue to work with PT/OT. Use walker at all time to prevent falls.   Please make sure you are staying well hydrated. I recommend 50-60 ounces daily. Well balanced diet and regular exercise encouraged. Consistent sleep schedule with 6-8 hours recommended.   Please continue follow up with care team as directed.   Follow up with me in 4 months   You may receive a survey regarding today's visit. I encourage you to leave honest feed back as I do use this information to improve patient care. Thank you for seeing me today!

## 2022-09-17 ENCOUNTER — Ambulatory Visit: Payer: PPO | Admitting: Family Medicine

## 2022-09-17 ENCOUNTER — Encounter: Payer: Self-pay | Admitting: Family Medicine

## 2022-09-17 VITALS — BP 131/68 | HR 68 | Ht 67.0 in | Wt 143.0 lb

## 2022-09-17 DIAGNOSIS — W19XXXD Unspecified fall, subsequent encounter: Secondary | ICD-10-CM | POA: Diagnosis not present

## 2022-09-17 DIAGNOSIS — I639 Cerebral infarction, unspecified: Secondary | ICD-10-CM | POA: Diagnosis not present

## 2022-09-17 DIAGNOSIS — E782 Mixed hyperlipidemia: Secondary | ICD-10-CM

## 2022-09-17 DIAGNOSIS — R531 Weakness: Secondary | ICD-10-CM

## 2022-09-17 DIAGNOSIS — I10 Essential (primary) hypertension: Secondary | ICD-10-CM | POA: Diagnosis not present

## 2022-09-21 DIAGNOSIS — S2241XD Multiple fractures of ribs, right side, subsequent encounter for fracture with routine healing: Secondary | ICD-10-CM | POA: Diagnosis not present

## 2022-09-21 DIAGNOSIS — I712 Thoracic aortic aneurysm, without rupture, unspecified: Secondary | ICD-10-CM | POA: Diagnosis not present

## 2022-09-21 DIAGNOSIS — I350 Nonrheumatic aortic (valve) stenosis: Secondary | ICD-10-CM | POA: Diagnosis not present

## 2022-09-21 DIAGNOSIS — F39 Unspecified mood [affective] disorder: Secondary | ICD-10-CM | POA: Diagnosis not present

## 2022-09-21 DIAGNOSIS — I69351 Hemiplegia and hemiparesis following cerebral infarction affecting right dominant side: Secondary | ICD-10-CM | POA: Diagnosis not present

## 2022-09-21 DIAGNOSIS — I110 Hypertensive heart disease with heart failure: Secondary | ICD-10-CM | POA: Diagnosis not present

## 2022-09-21 DIAGNOSIS — E46 Unspecified protein-calorie malnutrition: Secondary | ICD-10-CM | POA: Diagnosis not present

## 2022-09-21 DIAGNOSIS — E7800 Pure hypercholesterolemia, unspecified: Secondary | ICD-10-CM | POA: Diagnosis not present

## 2022-09-21 DIAGNOSIS — R270 Ataxia, unspecified: Secondary | ICD-10-CM | POA: Diagnosis not present

## 2022-09-21 DIAGNOSIS — I5030 Unspecified diastolic (congestive) heart failure: Secondary | ICD-10-CM | POA: Diagnosis not present

## 2022-09-25 DIAGNOSIS — R5383 Other fatigue: Secondary | ICD-10-CM | POA: Diagnosis not present

## 2022-09-28 NOTE — Progress Notes (Unsigned)
Cardiology Office Note   Date:  03/21/2022   ID:  Clarence Myers, DOB 1933-11-06, MRN [id number]  PCP:  Mayra Neer, MD  Cardiologist:   Aislin Onofre Martinique, MD   No chief complaint on file.    History of Present Illness: Clarence Myers is a 89 y.o. male who is seen at the request of Dr Brigitte Pulse for evaluation of murmur.  He has a history of CAD, HLD. Remote angioplasty of the LAD in 1992. Last ETT was done in 2015. Had recent Echo done for murmur. Noted mild to moderate AS. Normal LV function. Mild Aortic enlargement.   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 Had right UE weakness and slurred speech. Managed with Plavix. Also had aspiration PNA. Readmitted in May. Presented with diffuse weakness. No focal deficits were noted.  MRI however showed subacute infarct in the posterior left frontal corona radiata. Repeat Echo in June 2023 showed some increase in AV gradient but in moderate range. Event monitor was benign. Statin dose was increased. Was treated with ASA and Brilinta for one month then Plavix was resumed.   He is seen today for follow up. Denies any chest pain, palpitations, dizziness or dyspnea. Still has right sided weakness. Notes shuffling gait. Getting PT twice a week. Notes voice is weak.     Past Medical History:  Diagnosis Date   CAD (coronary artery disease)    Hyperlipidemia    Low back strain    Shoulder injury    Fell and injured Right shoulder   Stroke [facility])     Past Surgical History:  Procedure Laterality Date   ANGIOPLASTY     1992-LAD   CARDIAC CATHETERIZATION       Current Outpatient Medications  Medication Sig Dispense Refill   acetaminophen (TYLENOL) 325 MG tablet Take 2 tablets (650 mg total) by mouth every 4 (four) hours as needed for mild pain (or temp > 37.5 C (99.5 F)).     clopidogrel (PLAVIX) 75 MG tablet Take 1 tablet (75 mg total) by mouth daily. RESUME ONLY AFTER COMPLETION OF 30 DAYS OF ASPIRIN AND BRILINTA      LUMIGAN 0.01 % SOLN Place 1 drop into both eyes at bedtime.     metoprolol tartrate (LOPRESSOR) 25 MG tablet Take 12.5 mg by mouth 2 (two) times daily.     mirtazapine (REMERON) 15 MG tablet Take 15 mg by mouth at bedtime.     Multiple Vitamin (MULTI-VITAMIN PO) Take by mouth daily.       multivitamin-lutein (OCUVITE-LUTEIN) CAPS Take 1 capsule by mouth daily.       Omega-3 Fatty Acids (FISH OIL PO) Take 1,000 mg by mouth in the morning and at bedtime.     omeprazole (PRILOSEC) 20 MG capsule Take 20 mg by mouth daily.     Probiotic Product (PROBIOTIC DAILY PO) Take 1 capsule by mouth daily.     RESTASIS 0.05 % ophthalmic emulsion Place 1 drop into both eyes 2 (two) times daily.     rosuvastatin (CRESTOR) 20 MG tablet Take 1 tablet (20 mg total) by mouth daily. 30 tablet 3   sertraline (ZOLOFT) 25 MG tablet Take 25 mg by mouth at bedtime.     doxycycline (VIBRA-TABS) 100 MG tablet Take 100 mg by mouth 2 (two) times daily.     No current facility-administered medications for this visit.    Allergies:   Blue crab (cavinectes sapidus) allergy skin test and Other  Social History:  The patient  reports that he has never smoked. He has never used smokeless tobacco.   Family History:  The patient's family history is not on file.    ROS:  Please see the history of present illness.   Otherwise, review of systems are positive for none.   All other systems are reviewed and negative.    PHYSICAL EXAM: VS:  BP 110/70 (BP Location: Left Arm, Patient Position: Sitting, Cuff Size: Normal)   Pulse 65   Ht 5' 6"$  (1.676 m)   Wt 126 lb (57.2 kg)   SpO2 95%   BMI 20.34 kg/m  , BMI Body mass index is 20.34 kg/m. GEN: Well nourished, well developed, in no acute distress HEENT: normal Neck: no JVD, carotid bruits, or masses Cardiac: RRR; no murmurs, rubs, or gallops,no edema  Respiratory:  clear to auscultation bilaterally, normal work of breathing GI: soft, nontender, nondistended, + BS MS: no  deformity or atrophy Skin: warm and dry, no rash Neuro:  Strength and sensation are intact Psych: euthymic mood, full affect   EKG:  EKG is not ordered today.    Recent Labs: 10/07/2021: Magnesium 2.0 12/29/2021: ALT 14 12/31/2021: BUN 20; Creatinine, Ser 0.97; Hemoglobin 13.0; Platelets 283; Potassium 3.4; Sodium 141   Dat ed 04/20/20: cholesterol 159, triglycerides 67, HDL 53, LDL 92. A1c 6%. CMET normal Dated 03/21/22: cholesterol 156, triglycerides 195, HDL 46, LDL 97. A1c 5.8%. Dated 09/05/22: CMET, CBC, TSH  Lipid Panel    Component Value Date/Time   CHOL 153 12/30/2021 0431   TRIG 32 12/30/2021 0431   HDL 50 12/30/2021 0431   CHOLHDL 3.1 12/30/2021 0431   VLDL 6 12/30/2021 0431   LDLCALC 97 12/30/2021 0431      Wt Readings from Last 3 Encounters:  03/21/22 126 lb (57.2 kg)  03/13/22 126 lb (57.2 kg)  12/29/21 116 lb (52.6 kg)      Other studies Reviewed: Additional studies/ records that were reviewed today include:   Marland Kitchen Exercise Treadmill Test   Pre-Exercise Testing Evaluation NSR, normal ECG   Test   Exercise Tolerance Test Ordering MD: Kataryna Mcquilkin Martinique, MD     Unique Test No: 1  Treadmill:  1  Indication for ETT: chest pain - rule out ischemia  Contraindication to ETT: No   Stress Modality: exercise - treadmill  Cardiac Imaging Performed: non   Protocol: standard Bruce - maximal  Max BP:  218/85  Max MPHR (bpm):  142 85% MPR (bpm):  120  MPHR obtained (bpm):  150 % MPHR obtained:  104  Reached 85% MPHR (min:sec):  5:30   Total Exercise Time (min-sec):  5  Workload in METS:  7.0 Borg Scale: 15  Reason ETT Terminated:  SOB and General Fatigue      ST Segment Analysis At Rest:           normal ST segments - no evidence of significant ST depression With Exercise: significant ischemic ST depression -2-3 mm in the inferior leads, 1 mm in lead V5   Other Information Arrhythmia:  PVCs and a ventricular couplet during exercise           Angina during ETT:  absent  (0) Quality of ETT:  diagnostic   ETT Interpretation:  abnormal - evidence of ST depression consistent with ischemia   Comments: Excellent exercise tolerance. Hypertensive response to exercise.   Recommendations: Consider further evaluation for CAD since the previous study was reported as normal in 2014. However,  exercise capacity is unchanged from 2014 and the ST changes may be a post-CABG "false positive" abnormality.   Sanda Klein, MD, [facility] [phone] office [phone] pager   Echo 12/27/20: IMPRESSIONS     1. Left ventricular ejection fraction, by estimation, is 55 to 60%. Left  ventricular ejection fraction by 3D volume is 57 %. The left ventricle has  normal function. The left ventricle has no regional wall motion  abnormalities. There is mild left  ventricular hypertrophy. Left ventricular diastolic parameters are  consistent with Grade I diastolic dysfunction (impaired relaxation). The  average left ventricular global longitudinal strain is -23.8 %.   2. Right ventricular systolic function is normal. The right ventricular  size is normal. Tricuspid regurgitation signal is inadequate for assessing  PA pressure.   3. The mitral valve is normal in structure. No evidence of mitral valve  regurgitation.   4. The aortic valve is calcified. There is moderate calcification of the  aortic valve. Aortic valve regurgitation is not visualized. Mild to  moderate aortic valve stenosis. Vmax 2.4 m/s, MG 13 mmHg, AVA 1.1 cm^2, DI  0.33   5. Aortic dilatation noted. There is dilatation of the ascending aorta,  measuring 40 mm.   6. The inferior vena cava is normal in size with greater than 50%  respiratory variability, suggesting right atrial pressure of 3 mmHg.   Echo 02/09/22: IMPRESSIONS     1. Left ventricular ejection fraction, by estimation, is 55 to 60%. The  left ventricle has normal function. The left ventricle has no regional  wall motion  abnormalities. Left ventricular diastolic parameters are  consistent with Grade I diastolic  dysfunction (impaired relaxation).   2. Right ventricular systolic function is normal. The right ventricular  size is normal.   3. The mitral valve is normal in structure. Trivial mitral valve  regurgitation. No evidence of mitral stenosis.   4. The aortic valve is functionally bicuspid. The aortic valve is  bicuspid. There is moderate calcification of the aortic valve. Aortic  valve regurgitation is not visualized. Moderate to severe aortic valve  stenosis. Aortic valve area, by VTI measures  0.92 cm. Aortic valve mean gradient measures 17.0 mmHg. Aortic valve Vmax  measures 2.88 m/s.   5. The inferior vena cava is normal in size with greater than 50%  respiratory variability, suggesting right atrial pressure of 3 mmHg.  Event monitor 6/23: Study Highlights  Normal sinus rhythm Occasional isolated PACs Very rare PVCs No afib noted on 30 day recording  ASSESSMENT AND PLAN:  1.  CAD s/p remote POBA of the proximal LAD 31 years ago. Asymptomatic. Now on antiplatelet therapy with Plavix.  2. Moderate Aortic stenosis. Asymptomatic. Will repeat Echo in one year. 3. Mild aortic enlargement.  4. HLD on statin. Will update lipids and CMET on higher Crestor dose.  5. S/p CVA x 2. Event monitor negative for Afib. Continue antiplatelet therapy.    Current medicines are reviewed at length with the patient today.  The patient does not have concerns regarding medicines.  The following changes have been made:  none  Labs/ tests ordered today include:  No orders of the defined types were placed in this encounter.     Disposition:   FU with me  in  6 months  Signed, Haytham Maher Martinique, MD  03/21/2022 2:12 PM    [facility] [address], [place], [place], [address] Phone [phone], Fax [phone]

## 2022-09-29 DIAGNOSIS — R2681 Unsteadiness on feet: Secondary | ICD-10-CM | POA: Diagnosis not present

## 2022-09-29 DIAGNOSIS — I639 Cerebral infarction, unspecified: Secondary | ICD-10-CM | POA: Diagnosis not present

## 2022-09-29 DIAGNOSIS — G8101 Flaccid hemiplegia affecting right dominant side: Secondary | ICD-10-CM | POA: Diagnosis not present

## 2022-09-29 DIAGNOSIS — M6281 Muscle weakness (generalized): Secondary | ICD-10-CM | POA: Diagnosis not present

## 2022-10-01 ENCOUNTER — Encounter: Payer: Self-pay | Admitting: Cardiology

## 2022-10-01 ENCOUNTER — Ambulatory Visit: Payer: PPO | Attending: Cardiology | Admitting: Cardiology

## 2022-10-01 VITALS — BP 126/70 | HR 66 | Ht 67.0 in | Wt 134.0 lb

## 2022-10-01 DIAGNOSIS — I2510 Atherosclerotic heart disease of native coronary artery without angina pectoris: Secondary | ICD-10-CM

## 2022-10-01 DIAGNOSIS — I350 Nonrheumatic aortic (valve) stenosis: Secondary | ICD-10-CM | POA: Diagnosis not present

## 2022-10-01 DIAGNOSIS — E7800 Pure hypercholesterolemia, unspecified: Secondary | ICD-10-CM

## 2022-10-01 DIAGNOSIS — I639 Cerebral infarction, unspecified: Secondary | ICD-10-CM | POA: Diagnosis not present

## 2022-10-01 NOTE — Patient Instructions (Signed)
Medication Instructions:  No changes *If you need a refill on your cardiac medications before your next appointment, please call your pharmacy*  Follow-Up: At [facility], you and your health needs are our priority.  As part of our continuing mission to provide you with exceptional heart care, we have created designated Provider Care Teams.  These Care Teams include your primary Cardiologist (physician) and Advanced Practice Providers (APPs -  Physician Assistants and Nurse Practitioners) who all work together to provide you with the care you need, when you need it.  We recommend signing up for the patient portal called "MyChart".  Sign up information is provided on this After Visit Summary.  MyChart is used to connect with patients for Virtual Visits (Telemedicine).  Patients are able to view lab/test results, encounter notes, upcoming appointments, etc.  Non-urgent messages can be sent to your provider as well.   To learn more about what you can do with MyChart, go to [email or web address].    Your next appointment:   6 month(s)  Provider:   Dr Martinique

## 2022-10-13 DIAGNOSIS — I639 Cerebral infarction, unspecified: Secondary | ICD-10-CM | POA: Diagnosis not present

## 2022-10-13 DIAGNOSIS — I2510 Atherosclerotic heart disease of native coronary artery without angina pectoris: Secondary | ICD-10-CM | POA: Diagnosis not present

## 2022-10-17 DIAGNOSIS — H401123 Primary open-angle glaucoma, left eye, severe stage: Secondary | ICD-10-CM | POA: Diagnosis not present

## 2022-10-22 DIAGNOSIS — F39 Unspecified mood [affective] disorder: Secondary | ICD-10-CM | POA: Diagnosis not present

## 2022-10-22 DIAGNOSIS — I712 Thoracic aortic aneurysm, without rupture, unspecified: Secondary | ICD-10-CM | POA: Diagnosis not present

## 2022-10-22 DIAGNOSIS — I5030 Unspecified diastolic (congestive) heart failure: Secondary | ICD-10-CM | POA: Diagnosis not present

## 2022-10-22 DIAGNOSIS — E46 Unspecified protein-calorie malnutrition: Secondary | ICD-10-CM | POA: Diagnosis not present

## 2022-10-22 DIAGNOSIS — R270 Ataxia, unspecified: Secondary | ICD-10-CM | POA: Diagnosis not present

## 2022-10-22 DIAGNOSIS — I69351 Hemiplegia and hemiparesis following cerebral infarction affecting right dominant side: Secondary | ICD-10-CM | POA: Diagnosis not present

## 2022-10-22 DIAGNOSIS — I110 Hypertensive heart disease with heart failure: Secondary | ICD-10-CM | POA: Diagnosis not present

## 2022-10-22 DIAGNOSIS — S2241XD Multiple fractures of ribs, right side, subsequent encounter for fracture with routine healing: Secondary | ICD-10-CM | POA: Diagnosis not present

## 2022-10-22 DIAGNOSIS — I350 Nonrheumatic aortic (valve) stenosis: Secondary | ICD-10-CM | POA: Diagnosis not present

## 2022-10-22 DIAGNOSIS — E7800 Pure hypercholesterolemia, unspecified: Secondary | ICD-10-CM | POA: Diagnosis not present

## 2022-10-28 DIAGNOSIS — M6281 Muscle weakness (generalized): Secondary | ICD-10-CM | POA: Diagnosis not present

## 2022-10-28 DIAGNOSIS — G8101 Flaccid hemiplegia affecting right dominant side: Secondary | ICD-10-CM | POA: Diagnosis not present

## 2022-10-28 DIAGNOSIS — R2681 Unsteadiness on feet: Secondary | ICD-10-CM | POA: Diagnosis not present

## 2022-10-28 DIAGNOSIS — I639 Cerebral infarction, unspecified: Secondary | ICD-10-CM | POA: Diagnosis not present

## 2022-11-01 DIAGNOSIS — I25119 Atherosclerotic heart disease of native coronary artery with unspecified angina pectoris: Secondary | ICD-10-CM | POA: Diagnosis not present

## 2022-11-01 DIAGNOSIS — R7301 Impaired fasting glucose: Secondary | ICD-10-CM | POA: Diagnosis not present

## 2022-11-01 DIAGNOSIS — I69351 Hemiplegia and hemiparesis following cerebral infarction affecting right dominant side: Secondary | ICD-10-CM | POA: Diagnosis not present

## 2022-11-01 DIAGNOSIS — E782 Mixed hyperlipidemia: Secondary | ICD-10-CM | POA: Diagnosis not present

## 2022-11-01 DIAGNOSIS — G5600 Carpal tunnel syndrome, unspecified upper limb: Secondary | ICD-10-CM | POA: Diagnosis not present

## 2022-11-01 DIAGNOSIS — I119 Hypertensive heart disease without heart failure: Secondary | ICD-10-CM | POA: Diagnosis not present

## 2022-11-01 DIAGNOSIS — Z993 Dependence on wheelchair: Secondary | ICD-10-CM | POA: Diagnosis not present

## 2022-11-01 DIAGNOSIS — F39 Unspecified mood [affective] disorder: Secondary | ICD-10-CM | POA: Diagnosis not present

## 2022-11-02 DIAGNOSIS — I119 Hypertensive heart disease without heart failure: Secondary | ICD-10-CM | POA: Diagnosis not present

## 2022-11-07 DIAGNOSIS — R1111 Vomiting without nausea: Secondary | ICD-10-CM | POA: Diagnosis not present

## 2022-11-07 DIAGNOSIS — R231 Pallor: Secondary | ICD-10-CM | POA: Diagnosis not present

## 2022-11-07 DIAGNOSIS — R1084 Generalized abdominal pain: Secondary | ICD-10-CM | POA: Diagnosis not present

## 2022-11-07 DIAGNOSIS — R112 Nausea with vomiting, unspecified: Secondary | ICD-10-CM | POA: Diagnosis not present

## 2022-11-07 DIAGNOSIS — R110 Nausea: Secondary | ICD-10-CM | POA: Diagnosis not present

## 2022-11-08 DIAGNOSIS — Z79899 Other long term (current) drug therapy: Secondary | ICD-10-CM | POA: Diagnosis not present

## 2022-11-08 DIAGNOSIS — J984 Other disorders of lung: Secondary | ICD-10-CM | POA: Diagnosis not present

## 2022-11-08 DIAGNOSIS — R1319 Other dysphagia: Secondary | ICD-10-CM | POA: Diagnosis not present

## 2022-11-08 DIAGNOSIS — M6281 Muscle weakness (generalized): Secondary | ICD-10-CM | POA: Diagnosis not present

## 2022-11-08 DIAGNOSIS — D72829 Elevated white blood cell count, unspecified: Secondary | ICD-10-CM | POA: Diagnosis not present

## 2022-11-08 DIAGNOSIS — K219 Gastro-esophageal reflux disease without esophagitis: Secondary | ICD-10-CM | POA: Diagnosis not present

## 2022-11-08 DIAGNOSIS — I350 Nonrheumatic aortic (valve) stenosis: Secondary | ICD-10-CM | POA: Diagnosis not present

## 2022-11-08 DIAGNOSIS — R2689 Other abnormalities of gait and mobility: Secondary | ICD-10-CM | POA: Diagnosis not present

## 2022-11-08 DIAGNOSIS — J690 Pneumonitis due to inhalation of food and vomit: Secondary | ICD-10-CM | POA: Diagnosis not present

## 2022-11-08 DIAGNOSIS — J479 Bronchiectasis, uncomplicated: Secondary | ICD-10-CM | POA: Diagnosis not present

## 2022-11-08 DIAGNOSIS — R1310 Dysphagia, unspecified: Secondary | ICD-10-CM | POA: Diagnosis not present

## 2022-11-08 DIAGNOSIS — Z789 Other specified health status: Secondary | ICD-10-CM | POA: Diagnosis not present

## 2022-11-08 DIAGNOSIS — R9431 Abnormal electrocardiogram [ECG] [EKG]: Secondary | ICD-10-CM | POA: Diagnosis not present

## 2022-11-08 DIAGNOSIS — Z7902 Long term (current) use of antithrombotics/antiplatelets: Secondary | ICD-10-CM | POA: Diagnosis not present

## 2022-11-08 DIAGNOSIS — E785 Hyperlipidemia, unspecified: Secondary | ICD-10-CM | POA: Diagnosis not present

## 2022-11-08 DIAGNOSIS — R059 Cough, unspecified: Secondary | ICD-10-CM | POA: Diagnosis not present

## 2022-11-08 DIAGNOSIS — I2510 Atherosclerotic heart disease of native coronary artery without angina pectoris: Secondary | ICD-10-CM | POA: Diagnosis not present

## 2022-11-08 DIAGNOSIS — D649 Anemia, unspecified: Secondary | ICD-10-CM | POA: Diagnosis not present

## 2022-11-08 DIAGNOSIS — I69391 Dysphagia following cerebral infarction: Secondary | ICD-10-CM | POA: Diagnosis not present

## 2022-11-08 DIAGNOSIS — R269 Unspecified abnormalities of gait and mobility: Secondary | ICD-10-CM | POA: Diagnosis not present

## 2022-11-08 DIAGNOSIS — Z636 Dependent relative needing care at home: Secondary | ICD-10-CM | POA: Diagnosis not present

## 2022-11-08 DIAGNOSIS — I700 Atherosclerosis of aorta: Secondary | ICD-10-CM | POA: Diagnosis not present

## 2022-11-09 DIAGNOSIS — J690 Pneumonitis due to inhalation of food and vomit: Secondary | ICD-10-CM | POA: Diagnosis not present

## 2022-11-09 DIAGNOSIS — I350 Nonrheumatic aortic (valve) stenosis: Secondary | ICD-10-CM | POA: Diagnosis not present

## 2022-11-09 DIAGNOSIS — F39 Unspecified mood [affective] disorder: Secondary | ICD-10-CM | POA: Diagnosis not present

## 2022-11-09 DIAGNOSIS — R270 Ataxia, unspecified: Secondary | ICD-10-CM | POA: Diagnosis not present

## 2022-11-09 DIAGNOSIS — I5030 Unspecified diastolic (congestive) heart failure: Secondary | ICD-10-CM | POA: Diagnosis not present

## 2022-11-09 DIAGNOSIS — I25119 Atherosclerotic heart disease of native coronary artery with unspecified angina pectoris: Secondary | ICD-10-CM | POA: Diagnosis not present

## 2022-11-09 DIAGNOSIS — R208 Other disturbances of skin sensation: Secondary | ICD-10-CM | POA: Diagnosis not present

## 2022-11-09 DIAGNOSIS — E7800 Pure hypercholesterolemia, unspecified: Secondary | ICD-10-CM | POA: Diagnosis not present

## 2022-11-09 DIAGNOSIS — E46 Unspecified protein-calorie malnutrition: Secondary | ICD-10-CM | POA: Diagnosis not present

## 2022-11-09 DIAGNOSIS — R1310 Dysphagia, unspecified: Secondary | ICD-10-CM | POA: Diagnosis not present

## 2022-11-09 DIAGNOSIS — I69351 Hemiplegia and hemiparesis following cerebral infarction affecting right dominant side: Secondary | ICD-10-CM | POA: Diagnosis not present

## 2022-11-09 DIAGNOSIS — R269 Unspecified abnormalities of gait and mobility: Secondary | ICD-10-CM | POA: Diagnosis not present

## 2022-11-09 DIAGNOSIS — I712 Thoracic aortic aneurysm, without rupture, unspecified: Secondary | ICD-10-CM | POA: Diagnosis not present

## 2022-11-09 DIAGNOSIS — S2241XD Multiple fractures of ribs, right side, subsequent encounter for fracture with routine healing: Secondary | ICD-10-CM | POA: Diagnosis not present

## 2022-11-09 DIAGNOSIS — I110 Hypertensive heart disease with heart failure: Secondary | ICD-10-CM | POA: Diagnosis not present

## 2022-11-11 DIAGNOSIS — I639 Cerebral infarction, unspecified: Secondary | ICD-10-CM | POA: Diagnosis not present

## 2022-11-11 DIAGNOSIS — I2510 Atherosclerotic heart disease of native coronary artery without angina pectoris: Secondary | ICD-10-CM | POA: Diagnosis not present

## 2022-11-20 DIAGNOSIS — H401113 Primary open-angle glaucoma, right eye, severe stage: Secondary | ICD-10-CM | POA: Diagnosis not present

## 2022-11-21 DIAGNOSIS — E46 Unspecified protein-calorie malnutrition: Secondary | ICD-10-CM | POA: Diagnosis not present

## 2022-11-21 DIAGNOSIS — R270 Ataxia, unspecified: Secondary | ICD-10-CM | POA: Diagnosis not present

## 2022-11-21 DIAGNOSIS — I110 Hypertensive heart disease with heart failure: Secondary | ICD-10-CM | POA: Diagnosis not present

## 2022-11-21 DIAGNOSIS — I5030 Unspecified diastolic (congestive) heart failure: Secondary | ICD-10-CM | POA: Diagnosis not present

## 2022-11-21 DIAGNOSIS — F39 Unspecified mood [affective] disorder: Secondary | ICD-10-CM | POA: Diagnosis not present

## 2022-11-21 DIAGNOSIS — I69351 Hemiplegia and hemiparesis following cerebral infarction affecting right dominant side: Secondary | ICD-10-CM | POA: Diagnosis not present

## 2022-11-21 DIAGNOSIS — S2241XD Multiple fractures of ribs, right side, subsequent encounter for fracture with routine healing: Secondary | ICD-10-CM | POA: Diagnosis not present

## 2022-11-21 DIAGNOSIS — I712 Thoracic aortic aneurysm, without rupture, unspecified: Secondary | ICD-10-CM | POA: Diagnosis not present

## 2022-11-21 DIAGNOSIS — I350 Nonrheumatic aortic (valve) stenosis: Secondary | ICD-10-CM | POA: Diagnosis not present

## 2022-11-21 DIAGNOSIS — E7800 Pure hypercholesterolemia, unspecified: Secondary | ICD-10-CM | POA: Diagnosis not present

## 2022-11-28 DIAGNOSIS — I639 Cerebral infarction, unspecified: Secondary | ICD-10-CM | POA: Diagnosis not present

## 2022-11-28 DIAGNOSIS — M6281 Muscle weakness (generalized): Secondary | ICD-10-CM | POA: Diagnosis not present

## 2022-11-28 DIAGNOSIS — G8101 Flaccid hemiplegia affecting right dominant side: Secondary | ICD-10-CM | POA: Diagnosis not present

## 2022-11-28 DIAGNOSIS — R2681 Unsteadiness on feet: Secondary | ICD-10-CM | POA: Diagnosis not present

## 2022-12-12 DIAGNOSIS — I2510 Atherosclerotic heart disease of native coronary artery without angina pectoris: Secondary | ICD-10-CM | POA: Diagnosis not present

## 2022-12-12 DIAGNOSIS — I639 Cerebral infarction, unspecified: Secondary | ICD-10-CM | POA: Diagnosis not present

## 2023-01-22 DIAGNOSIS — B351 Tinea unguium: Secondary | ICD-10-CM | POA: Diagnosis not present

## 2023-01-22 DIAGNOSIS — L089 Local infection of the skin and subcutaneous tissue, unspecified: Secondary | ICD-10-CM | POA: Diagnosis not present

## 2023-01-22 DIAGNOSIS — I119 Hypertensive heart disease without heart failure: Secondary | ICD-10-CM | POA: Diagnosis not present

## 2023-01-25 DIAGNOSIS — H35371 Puckering of macula, right eye: Secondary | ICD-10-CM | POA: Diagnosis not present

## 2023-02-22 DIAGNOSIS — H401133 Primary open-angle glaucoma, bilateral, severe stage: Secondary | ICD-10-CM | POA: Diagnosis not present

## 2023-02-22 DIAGNOSIS — H401113 Primary open-angle glaucoma, right eye, severe stage: Secondary | ICD-10-CM | POA: Diagnosis not present

## 2023-02-22 DIAGNOSIS — H401190 Primary open-angle glaucoma, unspecified eye, stage unspecified: Secondary | ICD-10-CM | POA: Diagnosis not present

## 2023-03-12 ENCOUNTER — Ambulatory Visit
Admission: RE | Admit: 2023-03-12 | Discharge: 2023-03-12 | Disposition: A | Discharge status: Home / Self Care | Payer: PPO | Source: Ambulatory Visit | Attending: Family Medicine | Admitting: Family Medicine

## 2023-03-12 ENCOUNTER — Other Ambulatory Visit: Payer: Self-pay | Admitting: Family Medicine

## 2023-03-12 DIAGNOSIS — T17320A Food in larynx causing asphyxiation, initial encounter: Secondary | ICD-10-CM | POA: Diagnosis not present

## 2023-03-12 DIAGNOSIS — R059 Cough, unspecified: Secondary | ICD-10-CM | POA: Diagnosis not present

## 2023-03-12 DIAGNOSIS — R058 Other specified cough: Secondary | ICD-10-CM

## 2023-03-12 DIAGNOSIS — Z993 Dependence on wheelchair: Secondary | ICD-10-CM | POA: Diagnosis not present

## 2023-03-12 DIAGNOSIS — B351 Tinea unguium: Secondary | ICD-10-CM | POA: Diagnosis not present

## 2023-03-18 NOTE — Progress Notes (Addendum)
 [facility] [address] [place].  [address] [phone]       HOSPITAL FOLLOW UP NOTE  [name] Date of Birth:  09/17/1933 Medical Record Number:  [id number]   Reason for Referral:  hospital stroke follow up    SUBJECTIVE:   CHIEF COMPLAINT:  Chief Complaint  Patient presents with   Follow-up    Pt in room 1, daughter in room. Here for CVA follow up. Pt currently in wheelchair, pt is very weak, daughter reports a week ago pt had a bad week. Pt was not able to walk or feed himself, daughter said pt has gradually declined.     HPI:   Clarence Myers is a 89 y.o. who  has a past medical history of CAD (coronary artery disease), Hyperlipidemia, Low back strain, Shoulder injury, and Stroke (HCC).  Patient presented on 12/29/2021 with acute diffuse weakness upon waking that morning. He was having a difficult time standing up. MRI showed 9mm focus of diffusion in posterior left frontal corona radiata, likely small evolving subacute ischemic infarct. He was admitted for previous CVA in 09/2021 with residual right upper ext weakness. He was discharged home on Plavix at that time. With most recent CVA 12/29/2021, he was switched to [place] and asa continued. After 1 month he was to resume Plavix and discontinue asa 81mg . PT/OT recommended outpatient therapy. Personally reviewed hospitalization pertinent progress notes, lab work and imaging.  Evaluated by Roda Shutters.   Since being back home, he is doing fairly well. He did complete 30 day heart monitor. Per his daughter, no abnormal rhythm noted. He has follow up next month and will discuss possible loop recorder. He denies chest pain or shob. He continues to work with PT/OT. He feels that he is getting a little stronger as time passes. He does continues to have R>L weakness. He is ambulating with a walker. He did not bring walker with him, today. He feels that mood is ok. He is sleeping well. He continues Plavix.  He is taking rosuvastatin as prescribed. BP is well managed on metoprolol 12.5mg  BID.   UPDATE 09/17/2022 ALL: Mr Clarence Myers returns for follow up post CVA 09/2021 and 12/2021. He was last seen 02/2022 and doing fairly well. He was walking with a walker. He presents with his daughter, today, who assists with HPI. He seemed to be doing fairly well over the summer but had a fall about a month ago resulting in 2 broken ribs. Since, he has had more difficulty with lower ext weakness. He is working with PT in the home twice a week. He is able to walk short distances with walker. He is able to complete ADLs with minimal assistance. He is eating normally and sleeps well. May be sleeping a little more than normal. He denies concerns of depression. He has a caregiver that checks on him daily. He lives with his wife who is in poor health. He continues Plavix and rosuvastatin. He has close follow up with PCP and cardiology.   UPDATE 03/19/2023 ALL: Mr Clarence Myers for follow up post CVA 09/2021 and 12/2021. He was last seen 08/2022. He was working with PT following a fall resulting in two broken ribs. He was seen in ER at [facility], In 10/2022 for concerns of aspiration pna. IV antibiotics given and sent home with oral meds. HH referral with ST placed. Follow up with pulmonology advised. Family does not remember hearing back from [facility] and has not seen pulmonology.  Since, he continues to have progressive weakness. He was able to ambulate with walker/cane but had another episode of choking last week. He was seen by PCP. CXR clear. Swallowing precautions advised. He has had a total of three episodes of choking since 09/2021. Since event last week, he is mobilizing with wheelchair. He did not feel PT was very helpful but does continue HEP daily. He has a peddler he tries to use every day but admits that some days he just doesn't feel like it. He continues Plavix and rosuvastatin. He continues close follow up with PCP and cardiology.     PERTINENT IMAGING/LABS  MRI left CR small infarct CTA head and neck right PCA chronic occlusion, left VA, bilateral siphon and bilateral ICA bulb atherosclerosis 2D Echo EF 65 to 70% in 09/2021 30 day monitor negative for afib  consider loop recorder   A1C Lab Results  Component Value Date   HGBA1C 5.6 12/31/2021    Lipid Panel     Component Value Date/Time   CHOL 156 03/21/2022 1423   TRIG 195 (H) 03/21/2022 1423   HDL 46 03/21/2022 1423   CHOLHDL 3.4 03/21/2022 1423   CHOLHDL 3.1 12/30/2021 0431   VLDL 6 12/30/2021 0431   LDLCALC 77 03/21/2022 1423   LABVLDL 33 03/21/2022 1423    ROS:   14 system review of systems performed and negative with exception of those listed in HPI  PMH:  Past Medical History:  Diagnosis Date   CAD (coronary artery disease)    Hyperlipidemia    Low back strain    Shoulder injury    Fell and injured Right shoulder   Stroke (HCC)     PSH:  Past Surgical History:  Procedure Laterality Date   ANGIOPLASTY     1992-LAD   CARDIAC CATHETERIZATION      Social History:  Social History   Socioeconomic History   Marital status: Married    Spouse name: Not on file   Number of children: Not on file   Years of education: Not on file   Highest education level: Not on file  Occupational History   Not on file  Tobacco Use   Smoking status: Never   Smokeless tobacco: Never  Substance and Sexual Activity   Alcohol use: Not on file   Drug use: Not on file   Sexual activity: Not on file  Other Topics Concern   Not on file  Social History Narrative   Not on file   Social Determinants of Health   Financial Resource Strain: Not on file  Food Insecurity: Low Risk  (11/08/2022)   Received from [facility]   Food vital sign    Within the past 12 months, you worried that your food would run out before you got money to buy more: Never true    Within the past 12 months, the food you bought just didn't last and you didn't have money to  get more. : Never true  Transportation Needs: Not on file (11/08/2022)  Physical Activity: Not on file  Stress: Not on file  Social Connections: Not on file  Intimate Partner Violence: Not on file    Family History: No family history on file.  Medications:   Current Outpatient Medications on File Prior to Visit  Medication Sig Dispense Refill   acetaminophen (TYLENOL) 325 MG tablet Take 2 tablets (650 mg total) by mouth every 4 (four) hours as needed for mild pain (or temp > 37.5 C (99.5 F)).  atropine 1 % ophthalmic solution Place 1 drop into the right eye daily.     benzonatate (TESSALON) 100 MG capsule Take 100 mg by mouth at bedtime. Take 2 capsule at night     clopidogrel (PLAVIX) 75 MG tablet Take 1 tablet (75 mg total) by mouth daily. RESUME ONLY AFTER COMPLETION OF 30 DAYS OF ASPIRIN AND BRILINTA     metoprolol tartrate (LOPRESSOR) 25 MG tablet Take 12.5 mg by mouth 2 (two) times daily.     mirtazapine (REMERON) 15 MG tablet Take 15 mg by mouth at bedtime.     Multiple Vitamin (MULTI-VITAMIN PO) Take by mouth daily.       multivitamin-lutein (OCUVITE-LUTEIN) CAPS Take 1 capsule by mouth daily.       Omega-3 Fatty Acids (FISH OIL PO) Take 1,000 mg by mouth in the morning and at bedtime.     omeprazole (PRILOSEC) 20 MG capsule Take 20 mg by mouth daily.     prednisoLONE acetate (PRED FORTE) 1 % ophthalmic suspension Place 1 drop into the right eye. 6 times per day.     Probiotic Product (PROBIOTIC DAILY PO) Take 1 capsule by mouth daily.     RESTASIS 0.05 % ophthalmic emulsion Place 1 drop into both eyes 2 (two) times daily.     rosuvastatin (CRESTOR) 20 MG tablet Take 1 tablet (20 mg total) by mouth daily. 30 tablet 3   sertraline (ZOLOFT) 25 MG tablet Take 25 mg by mouth at bedtime.     No current facility-administered medications on file prior to visit.    Allergies:   Allergies  Allergen Reactions   Blue Crab (Cavinectes Sapidus) Allergy Skin Test     "Hives"    Other      Bee Stings     OBJECTIVE:  Physical Exam  Vitals:   03/19/23 1433  BP: 111/62  Pulse: 64  Height: 5\' 6"  (1.676 m)     Body mass index is 21.63 kg/m. No results found.      No data to display           General: well developed, well nourished, seated, in no evident distress Head: head normocephalic and atraumatic.   Neck: supple with no carotid or supraclavicular bruits Cardiovascular: regular rate and rhythm, occasional PVC, systolic murmur noted  Musculoskeletal: no deformity Skin:  no rash/petichiae Vascular:  Normal pulses all extremities   Neurologic Exam Mental Status: Awake and fully alert.  Fluent speech and language.  Oriented to place and time. Recent and remote memory intact. Attention span, concentration and fund of knowledge appropriate. Mood and affect appropriate.  Cranial Nerves: Fundoscopic exam reveals sharp disc margins. Pupils equal, briskly reactive to light. Extraocular movements full without nystagmus. Visual fields full to confrontation. Hearing intact. Facial sensation intact. Face, tongue, palate moves normally and symmetrically.  Motor: Normal bulk and tone. Normal strength in all tested extremity muscles with exception of very slight weakness notes in right upper and lower ext (4+/5) Sensory.: intact to touch , pinprick , position and vibratory sensation.  Coordination: Rapid alternating movements normal in all extremities. Finger-to-nose and heel-to-shin performed accurately bilaterally. Gait and Station: Patient presents in wheelchair, today.  Reflexes: 1+ and symmetric   NIHSS  2 Modified Rankin  0    ASSESSMENT: EUCLID CASSETTA is a 89 y.o. year old male presenting to the ER 12/29/2021 with generalized weakness. Vascular risk factors include HTN, HLD, advanced age, previous CVA.    PLAN:  Left CR small  infarct likely secondary to small vessel disease by location.  However, given prior embolic stroke and frequent PVCs on  telemetry, concerning for cardioembolic source also: Residual deficit: right arm weakness from CVA in 09/2021. Continue Plavix and rosuvastatin 20mg  for secondary stroke prevention.  Discussed secondary stroke prevention measures and importance of close PCP follow up for aggressive stroke risk factor management. I have gone over the pathophysiology of stroke, warning signs and symptoms, risk factors and their management in some detail with instructions to go to the closest emergency room for symptoms of concern. HTN: BP goal <130/90.  Stable on metoprolol 12.5mg  BID per PCP HLD: LDL goal <70. Recent LDL 97. Continue rosuvastatin 20mg  per PCP.  Right sided weakness: consider continuing to work with PT/OT. Use walker at all time to prevent falls. Consider neuro PT if interested. Fall precautions.  Choking episodes: consider swallowing evaluation. Consider ST if willing.    Follow up as needed   CC:  [facility] provider: Dr. Pearlean Brownie PCP: Lupita Raider, MD    I spent 45 minutes of face-to-face and non-face-to-face time with patient.  This included previsit chart review including review of recent hospitalization, lab review, study review, order entry, electronic health record documentation, patient education regarding recent stroke including etiology, secondary stroke prevention measures and importance of managing stroke risk factors, residual deficits and typical recovery time and answered all other questions to patient satisfaction   Shawnie Dapper, [facility]  [facility] [address] [place], [place] [address]  Phone [phone] Fax [phone] Note: This document was prepared with digital dictation and possible smart phrase technology. Any transcriptional errors that result from this process are unintentional.

## 2023-03-18 NOTE — Patient Instructions (Signed)

## 2023-03-19 ENCOUNTER — Ambulatory Visit: Payer: PPO | Admitting: Family Medicine

## 2023-03-19 ENCOUNTER — Encounter: Payer: Self-pay | Admitting: Family Medicine

## 2023-03-19 VITALS — BP 111/62 | HR 64 | Ht 66.0 in

## 2023-03-19 DIAGNOSIS — I639 Cerebral infarction, unspecified: Secondary | ICD-10-CM

## 2023-03-19 DIAGNOSIS — W19XXXD Unspecified fall, subsequent encounter: Secondary | ICD-10-CM

## 2023-04-29 ENCOUNTER — Ambulatory Visit: Payer: PPO | Admitting: Cardiology

## 2023-04-30 NOTE — Progress Notes (Signed)
Cardiology Office Note   Date:  05/03/2023   ID:  Clarence Myers, DOB 15-Dec-1933, MRN [id number]  PCP:  Lupita Raider, MD  Cardiologist:   Chaniece Barbato [place], MD   Chief Complaint  Patient presents with   Coronary Artery Disease   Aortic Stenosis     History of Present Illness: Clarence Myers is a 89 y.o. male who is seen at the request of Dr Clelia Croft for evaluation of murmur.  He has a history of CAD, HLD. Remote angioplasty of the LAD in 1992. Last ETT was done in 2015. Had recent Echo done for murmur. Noted mild to moderate AS. Normal LV function. Mild Aortic enlargement.   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 Had right UE weakness and slurred speech. Managed with Plavix. Also had aspiration PNA. Readmitted in May. Presented with diffuse weakness. No focal deficits were noted.  MRI however showed subacute infarct in the posterior left frontal corona radiata. Repeat Echo in June 2023 showed some increase in AV gradient but in moderate range. Event monitor was benign. Statin dose was increased. Was treated with ASA and Brilinta for one month then Plavix was resumed.   He is seen today for follow up with family. Denies any chest pain, palpitations, dizziness or dyspnea.  He has had progressive weakness. Able to walk short distances with walker. Has full time aid at home.  Some episodes of choking. Was seen by Neuro in July. Family reports no more choking.     Past Medical History:  Diagnosis Date   CAD (coronary artery disease)    Hyperlipidemia    Low back strain    Shoulder injury    Fell and injured Right shoulder   Stroke [facility])     Past Surgical History:  Procedure Laterality Date   ANGIOPLASTY     1992-LAD   CARDIAC CATHETERIZATION       Current Outpatient Medications  Medication Sig Dispense Refill   acetaminophen (TYLENOL) 325 MG tablet Take 2 tablets (650 mg total) by mouth every 4 (four) hours as needed for mild pain (or temp > 37.5 C  (99.5 F)).     atropine 1 % ophthalmic solution Place 1 drop into the right eye daily.     benzonatate (TESSALON) 100 MG capsule Take 100 mg by mouth at bedtime. Take 2 capsule at night     clopidogrel (PLAVIX) 75 MG tablet Take 1 tablet (75 mg total) by mouth daily. RESUME ONLY AFTER COMPLETION OF 30 DAYS OF ASPIRIN AND BRILINTA     metoprolol tartrate (LOPRESSOR) 25 MG tablet Take 12.5 mg by mouth 2 (two) times daily.     mirtazapine (REMERON) 15 MG tablet Take 15 mg by mouth at bedtime.     Multiple Vitamin (MULTI-VITAMIN PO) Take by mouth daily.       multivitamin-lutein (OCUVITE-LUTEIN) CAPS Take 1 capsule by mouth daily.       Omega-3 Fatty Acids (FISH OIL PO) Take 1,000 mg by mouth in the morning and at bedtime.     omeprazole (PRILOSEC) 20 MG capsule Take 20 mg by mouth daily.     prednisoLONE acetate (PRED FORTE) 1 % ophthalmic suspension Place 1 drop into the right eye. 6 times per day.     Probiotic Product (PROBIOTIC DAILY PO) Take 1 capsule by mouth daily.     RESTASIS 0.05 % ophthalmic emulsion Place 1 drop into both eyes 2 (two) times daily.  rosuvastatin (CRESTOR) 20 MG tablet Take 1 tablet (20 mg total) by mouth daily. 30 tablet 3   sertraline (ZOLOFT) 25 MG tablet Take 25 mg by mouth at bedtime.     No current facility-administered medications for this visit.    Allergies:   Blue crab (cavinectes sapidus) allergy skin test and Other    Social History:  The patient  reports that he has never smoked. He has never used smokeless tobacco.   Family History:  The patient's family history is not on file.    ROS:  Please see the history of present illness.   Otherwise, review of systems are positive for none.   All other systems are reviewed and negative.    PHYSICAL EXAM: VS:  BP 126/88   Pulse 63   SpO2 97%  , BMI There is no height or weight on file to calculate BMI. GEN: Well nourished, well developed, in no acute distress HEENT: normal Neck: no JVD, carotid  bruits, or masses Cardiac: RRR;harsh gr 2/6 systolic murmur RUSB. no rubs, or gallops,no edema  Respiratory:  clear to auscultation bilaterally, normal work of breathing GI: soft, nontender, nondistended, + BS MS: no deformity or atrophy Skin: warm and dry, no rash Neuro:  Strength and sensation are intact Psych: euthymic mood, full affect   EKG Interpretation Date/Time:  Friday May 03 2023 15:30:54 EDT Ventricular Rate:  64 PR Interval:  178 QRS Duration:  94 QT Interval:  442 QTC Calculation: 455 R Axis:   -24  Text Interpretation: Sinus rhythm with Premature atrial complexes Minimal voltage criteria for LVH, may be normal variant ( R in aVL ) When compared with ECG of 29-Dec-2021 19:09, No significant change since last tracing Confirmed by [place], Sequoyah Ramone [phone]) on 05/03/2023 3:32:36 PM   Recent Labs: No results found for requested labs within last 365 days.   Dat ed 04/20/20: cholesterol 159, triglycerides 67, HDL 53, LDL 92. A1c 6%. CMET normal Dated 03/21/22: cholesterol 156, triglycerides 195, HDL 46, LDL 97. A1c 5.8%. Dated 09/05/22: CMET, CBC, TSH Dated 11/01/22: cholesterol 157, triglycerides 69, HDL 53, LDL 91. A1c 5.9%.   Lipid Panel    Component Value Date/Time   CHOL 156 03/21/2022 1423   TRIG 195 (H) 03/21/2022 1423   HDL 46 03/21/2022 1423   CHOLHDL 3.4 03/21/2022 1423   CHOLHDL 3.1 12/30/2021 0431   VLDL 6 12/30/2021 0431   LDLCALC 77 03/21/2022 1423      Wt Readings from Last 3 Encounters:  10/01/22 134 lb (60.8 kg)  09/17/22 143 lb (64.9 kg)  03/21/22 126 lb (57.2 kg)      Other studies Reviewed: Additional studies/ records that were reviewed today include:   Marland Kitchen Exercise Treadmill Test   Pre-Exercise Testing Evaluation NSR, normal ECG   Test   Exercise Tolerance Test Ordering MD: Farzana Koci [place], MD     Unique Test No: 1  Treadmill:  1  Indication for ETT: chest pain - rule out ischemia  Contraindication to ETT: No   Stress Modality:  exercise - treadmill  Cardiac Imaging Performed: non   Protocol: standard Bruce - maximal  Max BP:  218/85  Max MPHR (bpm):  142 85% MPR (bpm):  120  MPHR obtained (bpm):  150 % MPHR obtained:  104  Reached 85% MPHR (min:sec):  5:30   Total Exercise Time (min-sec):  5  Workload in METS:  7.0 Borg Scale: 15  Reason ETT Terminated:  SOB and General Fatigue  ST Segment Analysis At Rest:           normal ST segments - no evidence of significant ST depression With Exercise: significant ischemic ST depression -2-3 mm in the inferior leads, 1 mm in lead V5   Other Information Arrhythmia:  PVCs and a ventricular couplet during exercise           Angina during ETT:  absent (0) Quality of ETT:  diagnostic   ETT Interpretation:  abnormal - evidence of ST depression consistent with ischemia   Comments: Excellent exercise tolerance. Hypertensive response to exercise.   Recommendations: Consider further evaluation for CAD since the previous study was reported as normal in 2014. However, exercise capacity is unchanged from 2014 and the ST changes may be a post-CABG "false positive" abnormality.   Thurmon Fair, MD, [facility] [phone] office [phone] pager   Echo 12/27/20: IMPRESSIONS     1. Left ventricular ejection fraction, by estimation, is 55 to 60%. Left  ventricular ejection fraction by 3D volume is 57 %. The left ventricle has  normal function. The left ventricle has no regional wall motion  abnormalities. There is mild left  ventricular hypertrophy. Left ventricular diastolic parameters are  consistent with Grade I diastolic dysfunction (impaired relaxation). The  average left ventricular global longitudinal strain is -23.8 %.   2. Right ventricular systolic function is normal. The right ventricular  size is normal. Tricuspid regurgitation signal is inadequate for assessing  PA pressure.   3. The mitral valve is normal in structure. No evidence of mitral  valve  regurgitation.   4. The aortic valve is calcified. There is moderate calcification of the  aortic valve. Aortic valve regurgitation is not visualized. Mild to  moderate aortic valve stenosis. Vmax 2.4 m/s, MG 13 mmHg, AVA 1.1 cm^2, DI  0.33   5. Aortic dilatation noted. There is dilatation of the ascending aorta,  measuring 40 mm.   6. The inferior vena cava is normal in size with greater than 50%  respiratory variability, suggesting right atrial pressure of 3 mmHg.   Echo 02/09/22: IMPRESSIONS     1. Left ventricular ejection fraction, by estimation, is 55 to 60%. The  left ventricle has normal function. The left ventricle has no regional  wall motion abnormalities. Left ventricular diastolic parameters are  consistent with Grade I diastolic  dysfunction (impaired relaxation).   2. Right ventricular systolic function is normal. The right ventricular  size is normal.   3. The mitral valve is normal in structure. Trivial mitral valve  regurgitation. No evidence of mitral stenosis.   4. The aortic valve is functionally bicuspid. The aortic valve is  bicuspid. There is moderate calcification of the aortic valve. Aortic  valve regurgitation is not visualized. Moderate to severe aortic valve  stenosis. Aortic valve area, by VTI measures  0.92 cm. Aortic valve mean gradient measures 17.0 mmHg. Aortic valve Vmax  measures 2.88 m/s.   5. The inferior vena cava is normal in size with greater than 50%  respiratory variability, suggesting right atrial pressure of 3 mmHg.  Event monitor 6/23: Study Highlights  Normal sinus rhythm Occasional isolated PACs Very rare PVCs No afib noted on 30 day recording  ASSESSMENT AND PLAN:  1.  CAD s/p remote POBA of the proximal LAD 30+ years ago. Asymptomatic. Now on antiplatelet monotherapy with Plavix.  2. Moderate Aortic stenosis. Asymptomatic. Will not pursue further evaluation now given limited functional capacity and lack of symptoms.   3.  Mild aortic enlargement.  4. HLD on statin. Labs per PCP 5. S/p CVA x 2. Event monitor negative for Afib. Continue antiplatelet therapy.    Current medicines are reviewed at length with the patient today.  The patient does not have concerns regarding medicines.  The following changes have been made:  none  Labs/ tests ordered today include:   Orders Placed This Encounter  Procedures   EKG 12-Lead    Disposition:   FU with me in one year  Signed, Aleesia Henney [place], MD  05/03/2023 3:53 PM    [facility] [address] [place], [place], [address] Phone [phone], Fax [phone]

## 2023-05-03 ENCOUNTER — Encounter: Payer: Self-pay | Admitting: Cardiology

## 2023-05-03 ENCOUNTER — Ambulatory Visit: Payer: PPO | Attending: Cardiology | Admitting: Cardiology

## 2023-05-03 VITALS — BP 126/88 | HR 63

## 2023-05-03 DIAGNOSIS — I2510 Atherosclerotic heart disease of native coronary artery without angina pectoris: Secondary | ICD-10-CM

## 2023-05-03 DIAGNOSIS — I350 Nonrheumatic aortic (valve) stenosis: Secondary | ICD-10-CM | POA: Diagnosis not present

## 2023-05-03 DIAGNOSIS — I639 Cerebral infarction, unspecified: Secondary | ICD-10-CM | POA: Diagnosis not present

## 2023-05-03 NOTE — Patient Instructions (Signed)
Medication Instructions:  No Change *If you need a refill on your cardiac medications before your next appointment, please call your pharmacy*   Lab Work: No Labs If you have labs (blood work) drawn today and your tests are completely normal, you will receive your results only by: MyChart Message (if you have MyChart) OR A paper copy in the mail If you have any lab test that is abnormal or we need to change your treatment, we will call you to review the results.   Testing/Procedures: No Testing   Follow-Up: At [facility], you and your health needs are our priority.  As part of our continuing mission to provide you with exceptional heart care, we have created designated Provider Care Teams.  These Care Teams include your primary Cardiologist (physician) and Advanced Practice Providers (APPs -  Physician Assistants and Nurse Practitioners) who all work together to provide you with the care you need, when you need it.  We recommend signing up for the patient portal called "MyChart".  Sign up information is provided on this After Visit Summary.  MyChart is used to connect with patients for Virtual Visits (Telemedicine).  Patients are able to view lab/test results, encounter notes, upcoming appointments, etc.  Non-urgent messages can be sent to your provider as well.   To learn more about what you can do with MyChart, go to [email or web address].    Your next appointment:   1 year(s)  Provider:   Dr [place]

## 2023-05-06 DIAGNOSIS — I350 Nonrheumatic aortic (valve) stenosis: Secondary | ICD-10-CM | POA: Diagnosis not present

## 2023-05-06 DIAGNOSIS — R7301 Impaired fasting glucose: Secondary | ICD-10-CM | POA: Diagnosis not present

## 2023-05-06 DIAGNOSIS — K5730 Diverticulosis of large intestine without perforation or abscess without bleeding: Secondary | ICD-10-CM | POA: Diagnosis not present

## 2023-05-06 DIAGNOSIS — I69351 Hemiplegia and hemiparesis following cerebral infarction affecting right dominant side: Secondary | ICD-10-CM | POA: Diagnosis not present

## 2023-05-06 DIAGNOSIS — I25119 Atherosclerotic heart disease of native coronary artery with unspecified angina pectoris: Secondary | ICD-10-CM | POA: Diagnosis not present

## 2023-05-06 DIAGNOSIS — H409 Unspecified glaucoma: Secondary | ICD-10-CM | POA: Diagnosis not present

## 2023-05-06 DIAGNOSIS — I77810 Thoracic aortic ectasia: Secondary | ICD-10-CM | POA: Diagnosis not present

## 2023-05-06 DIAGNOSIS — E782 Mixed hyperlipidemia: Secondary | ICD-10-CM | POA: Diagnosis not present

## 2023-05-06 DIAGNOSIS — I679 Cerebrovascular disease, unspecified: Secondary | ICD-10-CM | POA: Diagnosis not present

## 2023-05-06 DIAGNOSIS — Z0000 Encounter for general adult medical examination without abnormal findings: Secondary | ICD-10-CM | POA: Diagnosis not present

## 2023-05-06 DIAGNOSIS — F39 Unspecified mood [affective] disorder: Secondary | ICD-10-CM | POA: Diagnosis not present

## 2023-05-06 DIAGNOSIS — I119 Hypertensive heart disease without heart failure: Secondary | ICD-10-CM | POA: Diagnosis not present

## 2023-05-06 LAB — LAB REPORT - SCANNED
A1c: 5.9
EGFR: 68

## 2023-05-07 ENCOUNTER — Encounter: Payer: Self-pay | Admitting: Family Medicine

## 2023-05-20 DIAGNOSIS — N390 Urinary tract infection, site not specified: Secondary | ICD-10-CM | POA: Diagnosis not present

## 2023-05-24 DIAGNOSIS — H35371 Puckering of macula, right eye: Secondary | ICD-10-CM | POA: Diagnosis not present

## 2023-05-24 DIAGNOSIS — H401133 Primary open-angle glaucoma, bilateral, severe stage: Secondary | ICD-10-CM | POA: Diagnosis not present

## 2023-10-07 ENCOUNTER — Emergency Department (HOSPITAL_COMMUNITY): Payer: PPO

## 2023-10-07 ENCOUNTER — Inpatient Hospital Stay (HOSPITAL_COMMUNITY): Payer: PPO

## 2023-10-07 ENCOUNTER — Encounter (HOSPITAL_COMMUNITY): Payer: Self-pay

## 2023-10-07 ENCOUNTER — Other Ambulatory Visit: Payer: Self-pay

## 2023-10-07 ENCOUNTER — Inpatient Hospital Stay (HOSPITAL_COMMUNITY)
Admission: EM | Admit: 2023-10-07 | Discharge: 2023-10-08 | DRG: 065 | Disposition: A | Discharge status: Home Health | Payer: PPO | Attending: Internal Medicine | Admitting: Internal Medicine

## 2023-10-07 DIAGNOSIS — Z7902 Long term (current) use of antithrombotics/antiplatelets: Secondary | ICD-10-CM

## 2023-10-07 DIAGNOSIS — Z91030 Bee allergy status: Secondary | ICD-10-CM | POA: Diagnosis not present

## 2023-10-07 DIAGNOSIS — I5032 Chronic diastolic (congestive) heart failure: Secondary | ICD-10-CM

## 2023-10-07 DIAGNOSIS — I350 Nonrheumatic aortic (valve) stenosis: Secondary | ICD-10-CM | POA: Diagnosis present

## 2023-10-07 DIAGNOSIS — W44F3XA Food entering into or through a natural orifice, initial encounter: Secondary | ICD-10-CM | POA: Diagnosis present

## 2023-10-07 DIAGNOSIS — Z91013 Allergy to seafood: Secondary | ICD-10-CM

## 2023-10-07 DIAGNOSIS — G8314 Monoplegia of lower limb affecting left nondominant side: Secondary | ICD-10-CM | POA: Diagnosis present

## 2023-10-07 DIAGNOSIS — F411 Generalized anxiety disorder: Secondary | ICD-10-CM | POA: Diagnosis present

## 2023-10-07 DIAGNOSIS — I10 Essential (primary) hypertension: Secondary | ICD-10-CM

## 2023-10-07 DIAGNOSIS — Z79899 Other long term (current) drug therapy: Secondary | ICD-10-CM

## 2023-10-07 DIAGNOSIS — D649 Anemia, unspecified: Secondary | ICD-10-CM | POA: Diagnosis present

## 2023-10-07 DIAGNOSIS — I63542 Cerebral infarction due to unspecified occlusion or stenosis of left cerebellar artery: Principal | ICD-10-CM | POA: Diagnosis present

## 2023-10-07 DIAGNOSIS — I639 Cerebral infarction, unspecified: Principal | ICD-10-CM

## 2023-10-07 DIAGNOSIS — E785 Hyperlipidemia, unspecified: Secondary | ICD-10-CM | POA: Diagnosis present

## 2023-10-07 DIAGNOSIS — D72829 Elevated white blood cell count, unspecified: Secondary | ICD-10-CM | POA: Diagnosis present

## 2023-10-07 DIAGNOSIS — N189 Chronic kidney disease, unspecified: Secondary | ICD-10-CM | POA: Diagnosis not present

## 2023-10-07 DIAGNOSIS — I493 Ventricular premature depolarization: Secondary | ICD-10-CM | POA: Diagnosis present

## 2023-10-07 DIAGNOSIS — I130 Hypertensive heart and chronic kidney disease with heart failure and stage 1 through stage 4 chronic kidney disease, or unspecified chronic kidney disease: Secondary | ICD-10-CM | POA: Diagnosis present

## 2023-10-07 DIAGNOSIS — T17928A Food in respiratory tract, part unspecified causing other injury, initial encounter: Secondary | ICD-10-CM | POA: Diagnosis present

## 2023-10-07 DIAGNOSIS — E559 Vitamin D deficiency, unspecified: Secondary | ICD-10-CM | POA: Diagnosis present

## 2023-10-07 DIAGNOSIS — E7849 Other hyperlipidemia: Secondary | ICD-10-CM | POA: Diagnosis not present

## 2023-10-07 DIAGNOSIS — R29702 NIHSS score 2: Secondary | ICD-10-CM | POA: Diagnosis present

## 2023-10-07 DIAGNOSIS — R29703 NIHSS score 3: Secondary | ICD-10-CM | POA: Diagnosis not present

## 2023-10-07 DIAGNOSIS — Z8673 Personal history of transient ischemic attack (TIA), and cerebral infarction without residual deficits: Secondary | ICD-10-CM

## 2023-10-07 DIAGNOSIS — G8311 Monoplegia of lower limb affecting right dominant side: Secondary | ICD-10-CM | POA: Diagnosis present

## 2023-10-07 DIAGNOSIS — I2510 Atherosclerotic heart disease of native coronary artery without angina pectoris: Secondary | ICD-10-CM | POA: Diagnosis present

## 2023-10-07 DIAGNOSIS — K219 Gastro-esophageal reflux disease without esophagitis: Secondary | ICD-10-CM | POA: Diagnosis present

## 2023-10-07 DIAGNOSIS — Z9861 Coronary angioplasty status: Secondary | ICD-10-CM

## 2023-10-07 DIAGNOSIS — I6389 Other cerebral infarction: Secondary | ICD-10-CM | POA: Diagnosis not present

## 2023-10-07 DIAGNOSIS — N179 Acute kidney failure, unspecified: Secondary | ICD-10-CM

## 2023-10-07 DIAGNOSIS — Z8679 Personal history of other diseases of the circulatory system: Secondary | ICD-10-CM

## 2023-10-07 DIAGNOSIS — N182 Chronic kidney disease, stage 2 (mild): Secondary | ICD-10-CM | POA: Diagnosis present

## 2023-10-07 DIAGNOSIS — I6340 Cerebral infarction due to embolism of unspecified cerebral artery: Secondary | ICD-10-CM | POA: Diagnosis not present

## 2023-10-07 LAB — CBC
HCT: 38.5 % — ABNORMAL LOW (ref 39.0–52.0)
Hemoglobin: 12.7 g/dL — ABNORMAL LOW (ref 13.0–17.0)
MCH: 31.1 pg (ref 26.0–34.0)
MCHC: 33 g/dL (ref 30.0–36.0)
MCV: 94.4 fL (ref 80.0–100.0)
Platelets: 242 10*3/uL (ref 150–400)
RBC: 4.08 MIL/uL — ABNORMAL LOW (ref 4.22–5.81)
RDW: 13.2 % (ref 11.5–15.5)
WBC: 14.5 10*3/uL — ABNORMAL HIGH (ref 4.0–10.5)
nRBC: 0 % (ref 0.0–0.2)

## 2023-10-07 LAB — COMPREHENSIVE METABOLIC PANEL
ALT: 14 U/L (ref 0–44)
AST: 20 U/L (ref 15–41)
Albumin: 3.4 g/dL — ABNORMAL LOW (ref 3.5–5.0)
Alkaline Phosphatase: 57 U/L (ref 38–126)
Anion gap: 11 (ref 5–15)
BUN: 38 mg/dL — ABNORMAL HIGH (ref 8–23)
CO2: 23 mmol/L (ref 22–32)
Calcium: 9 mg/dL (ref 8.9–10.3)
Chloride: 106 mmol/L (ref 98–111)
Creatinine, Ser: 1.3 mg/dL — ABNORMAL HIGH (ref 0.61–1.24)
GFR, Estimated: 53 mL/min — ABNORMAL LOW (ref >60)
Glucose, Bld: 105 mg/dL — ABNORMAL HIGH (ref 70–99)
Potassium: 3.6 mmol/L (ref 3.5–5.1)
Sodium: 140 mmol/L (ref 135–145)
Total Bilirubin: 0.9 mg/dL (ref 0.0–1.2)
Total Protein: 7.5 g/dL (ref 6.5–8.1)

## 2023-10-07 MED ORDER — SENNOSIDES-DOCUSATE SODIUM 8.6-50 MG PO TABS: 1.0000 | ORAL_TABLET | Freq: Every evening | ORAL | Status: DC | PRN

## 2023-10-07 MED ORDER — STROKE: EARLY STAGES OF RECOVERY BOOK
Freq: Once | Status: AC
  Filled 2023-10-07: qty 1

## 2023-10-07 MED ORDER — MIRTAZAPINE 15 MG PO TABS
15.0000 mg | ORAL_TABLET | Freq: Every day | ORAL | Status: DC
  Administered 2023-10-07: 15 mg via ORAL
  Filled 2023-10-07: qty 1

## 2023-10-07 MED ORDER — ACETAMINOPHEN 650 MG RE SUPP: 650.0000 mg | RECTAL | Status: DC | PRN

## 2023-10-07 MED ORDER — ROSUVASTATIN CALCIUM 20 MG PO TABS
20.0000 mg | ORAL_TABLET | Freq: Every day | ORAL | Status: DC
  Administered 2023-10-08: 20 mg via ORAL
  Filled 2023-10-07: qty 1

## 2023-10-07 MED ORDER — ASPIRIN 81 MG PO TBEC
81.0000 mg | DELAYED_RELEASE_TABLET | Freq: Every day | ORAL | Status: DC
  Administered 2023-10-07 – 2023-10-08 (×2): 81 mg via ORAL
  Filled 2023-10-07 (×2): qty 1

## 2023-10-07 MED ORDER — SERTRALINE HCL 50 MG PO TABS: 25.0000 mg | ORAL_TABLET | Freq: Every day | ORAL | Status: DC

## 2023-10-07 MED ORDER — LACTATED RINGERS IV SOLN: INTRAVENOUS | Status: DC

## 2023-10-07 MED ORDER — IOHEXOL 350 MG/ML SOLN
75.0000 mL | Freq: Once | INTRAVENOUS | Status: AC | PRN
  Administered 2023-10-07: 75 mL via INTRAVENOUS

## 2023-10-07 MED ORDER — ACETAMINOPHEN 160 MG/5ML PO SOLN: 650.0000 mg | ORAL | Status: DC | PRN

## 2023-10-07 MED ORDER — PANTOPRAZOLE SODIUM 40 MG PO TBEC
40.0000 mg | DELAYED_RELEASE_TABLET | Freq: Every day | ORAL | Status: DC
  Administered 2023-10-08: 40 mg via ORAL
  Filled 2023-10-07: qty 1

## 2023-10-07 MED ORDER — ACETAMINOPHEN 325 MG PO TABS: 650.0000 mg | ORAL_TABLET | ORAL | Status: DC | PRN

## 2023-10-07 MED ORDER — HEPARIN SODIUM (PORCINE) 5000 UNIT/ML IJ SOLN
5000.0000 [IU] | Freq: Three times a day (TID) | INTRAMUSCULAR | Status: DC
  Administered 2023-10-07 – 2023-10-08 (×3): 5000 [IU] via SUBCUTANEOUS
  Filled 2023-10-07 (×3): qty 1

## 2023-10-07 MED ORDER — CLOPIDOGREL BISULFATE 75 MG PO TABS
75.0000 mg | ORAL_TABLET | Freq: Every day | ORAL | Status: DC
  Administered 2023-10-08: 75 mg via ORAL
  Filled 2023-10-07: qty 1

## 2023-10-07 MED ORDER — SODIUM CHLORIDE 0.9% FLUSH
3.0000 mL | Freq: Two times a day (BID) | INTRAVENOUS | Status: DC
  Administered 2023-10-07: 3 mL via INTRAVENOUS
  Administered 2023-10-08: 10 mL via INTRAVENOUS

## 2023-10-07 MED ORDER — SODIUM CHLORIDE 0.9% FLUSH: 3.0000 mL | INTRAVENOUS | Status: DC | PRN

## 2023-10-07 MED ORDER — LACTATED RINGERS IV BOLUS
1000.0000 mL | Freq: Once | INTRAVENOUS | Status: AC
  Administered 2023-10-07: 1000 mL via INTRAVENOUS

## 2023-10-07 MED ORDER — SERTRALINE HCL 50 MG PO TABS
25.0000 mg | ORAL_TABLET | Freq: Every day | ORAL | Status: DC
  Administered 2023-10-07: 25 mg via ORAL
  Filled 2023-10-07: qty 1

## 2023-10-07 NOTE — ED Triage Notes (Signed)
 Reports patient got choked on food last night and coughed all night and woke up this morning weak and a little confused.  A&Ox 3.  Denies pain any where.  Family reports he normally walks with a walker but they have had to use the wheelchair today.

## 2023-10-07 NOTE — ED Notes (Signed)
Unable to collect urine specimen. 

## 2023-10-07 NOTE — Consult Note (Signed)
 NEUROLOGY CONSULT NOTE   Date of service: October 07, 2023 Patient Name: Clarence Myers MRN:  [id number] DOB:  [birth year] Chief Complaint: "Stroke" Requesting Provider: Tereasa Coop, MD  History of Present Illness  Clarence Myers is a 89 y.o. male with hx of previous stroke with right hemiparesis who presents with increased difficulty walking on awakening this morning.  He states that he thinks his symptoms started last night at 8 PM when he started choking, and then repeatedly choked throughout the course of the night, coughing a lot.  He was "weaker" by which to me that he was unable to walk when he woke up this morning (typically uses a walker).   He also complains of some disequilibrium but denies double vision or vertigo.  LKW: 8 PM yesterday Modified rankin score: 4-Needs assistance to walk and tend to bodily needs IV Thrombolysis: No, outside window EVT: No, no signs of LVO  NIHSS components Score: Comment  1a Level of Conscious 0[x]  1[]  2[]  3[]      1b LOC Questions 0[x]  1[]  2[]       1c LOC Commands 0[x]  1[]  2[]       2 Best Gaze 0[x]  1[]  2[]       3 Visual 0[x]  1[]  2[]  3[]      4 Facial Palsy 0[]  1[x]  2[]  3[]      5a Motor Arm - left 0[x]  1[]  2[]  3[]  4[]  UN[]    5b Motor Arm - Right 0[]  1[x]  2[]  3[]  4[]  UN[]    6a Motor Leg - Left 0[x]  1[]  2[]  3[]  4[]  UN[]    6b Motor Leg - Right 0[]  1[x]  2[]  3[]  4[]  UN[]    7 Limb Ataxia 0[x]  1[]  2[]  3[]  UN[]     8 Sensory 0[x]  1[]  2[]  UN[]      9 Best Language 0[x]  1[]  2[]  3[]      10 Dysarthria 0[x]  1[]  2[]  UN[]      11 Extinct. and Inattention 0[x]  1[]  2[]       TOTAL: 3       Past History   Past Medical History:  Diagnosis Date   CAD (coronary artery disease)    Hyperlipidemia    Low back strain    Shoulder injury    Fell and injured Right shoulder   Stroke [facility])     Past Surgical History:  Procedure Laterality Date   ANGIOPLASTY     1992-LAD   CARDIAC CATHETERIZATION      Family History: History reviewed. No  pertinent family history.  Social History  reports that he has never smoked. He has never used smokeless tobacco. He reports that he does not drink alcohol and does not use drugs.  Allergies  Allergen Reactions   Crab Extract Cough, Hives and Swelling   Bee Venom Swelling   Blue Crab (Cavinectes Sapidus) Allergy Skin Test     "Hives"    Other     Bee Stings   Shellfish-Derived Products Hives    Medications   Current Facility-Administered Medications:    [START ON 10/08/2023]  stroke: early stages of recovery book, , Does not apply, Once, Sundil, Subrina, MD   [DISCONTINUED] acetaminophen (TYLENOL) tablet 650 mg, 650 mg, Oral, Q4H PRN **OR** [DISCONTINUED] acetaminophen (TYLENOL) 160 MG/5ML solution 650 mg, 650 mg, Per Tube, Q4H PRN **OR** acetaminophen (TYLENOL) suppository 650 mg, 650 mg, Rectal, Q4H PRN, Sundil, Subrina, MD   heparin injection 5,000 Units, 5,000 Units, Subcutaneous, Q8H, Sundil, Subrina, MD   lactated ringers bolus 1,000 mL, 1,000 mL, Intravenous, Once, Janalyn Shy, Subrina, MD  lactated ringers infusion, , Intravenous, Continuous, Sundil, Subrina, MD   sodium chloride flush (NS) 0.9 % injection 3-10 mL, 3-10 mL, Intravenous, Q12H, Sundil, Subrina, MD   sodium chloride flush (NS) 0.9 % injection 3-10 mL, 3-10 mL, Intravenous, PRN, Janalyn Shy, Subrina, MD  Current Outpatient Medications:    acetaminophen (TYLENOL) 325 MG tablet, Take 2 tablets (650 mg total) by mouth every 4 (four) hours as needed for mild pain (or temp > 37.5 C (99.5 F))., Disp: , Rfl:    atropine 1 % ophthalmic solution, Place 1 drop into the right eye daily., Disp: , Rfl:    benzonatate (TESSALON) 100 MG capsule, Take 100 mg by mouth at bedtime. Take 2 capsule at night, Disp: , Rfl:    clopidogrel (PLAVIX) 75 MG tablet, Take 1 tablet (75 mg total) by mouth daily. RESUME ONLY AFTER COMPLETION OF 30 DAYS OF ASPIRIN AND BRILINTA, Disp: , Rfl:    metoprolol tartrate (LOPRESSOR) 25 MG tablet, Take 12.5 mg by  mouth 2 (two) times daily., Disp: , Rfl:    mirtazapine (REMERON) 15 MG tablet, Take 15 mg by mouth at bedtime., Disp: , Rfl:    Multiple Vitamin (MULTI-VITAMIN PO), Take by mouth daily.  , Disp: , Rfl:    multivitamin-lutein (OCUVITE-LUTEIN) CAPS, Take 1 capsule by mouth daily.  , Disp: , Rfl:    Omega-3 Fatty Acids (FISH OIL PO), Take 1,000 mg by mouth in the morning and at bedtime., Disp: , Rfl:    omeprazole (PRILOSEC) 20 MG capsule, Take 20 mg by mouth daily., Disp: , Rfl:    prednisoLONE acetate (PRED FORTE) 1 % ophthalmic suspension, Place 1 drop into the right eye. 6 times per day., Disp: , Rfl:    Probiotic Product (PROBIOTIC DAILY PO), Take 1 capsule by mouth daily., Disp: , Rfl:    RESTASIS 0.05 % ophthalmic emulsion, Place 1 drop into both eyes 2 (two) times daily., Disp: , Rfl:    rosuvastatin (CRESTOR) 20 MG tablet, Take 1 tablet (20 mg total) by mouth daily., Disp: 30 tablet, Rfl: 3   sertraline (ZOLOFT) 25 MG tablet, Take 25 mg by mouth at bedtime., Disp: , Rfl:   Vitals   Vitals:   10/07/23 1520 10/07/23 1834  BP: 129/65 133/67  Pulse: 65 71  Resp: 18 (!) 23  Temp: 97.9 F (36.6 C)   TempSrc: Oral   SpO2: 94% 97%  Weight: 60.8 kg   Height: 5\' 6"  (1.676 m)     Body mass index is 21.63 kg/m.  Physical Exam   Constitutional: Appears well-developed and well-nourished.  Neurologic Examination    Neuro: Mental Status: Patient is awake, alert, oriented to person, place, month, year, and situation. Patient is able to give a clear and coherent history. No signs of aphasia or neglect Cranial Nerves: II: Visual Fields are full. Pupils are postsurgical bilaterally III,IV, VI: EOMI without ptosis or diploplia.  V: Facial sensation is symmetric to temperature VII: Facial movement with decrease in nasolabial fold on the left VIII: hearing is intact to voice X: Uvula elevates symmetrically XII: tongue is midline without atrophy or fasciculations.  Motor: Tone is  normal. Bulk is normal. 5/5 strength was present on the left, he has 4+/5 strength of the right arm and leg with mild drift(this is baseline from a previous stroke) Sensory: Sensation is symmetric to light touch and temperature in the arms and legs.Cerebellar: FNF and HKS are intact bilaterally   Labs/Imaging/Neurodiagnostic studies   CBC:  Recent Labs  Lab 10/07/23 1534  WBC 14.5*  HGB 12.7*  HCT 38.5*  MCV 94.4  PLT 242   Basic Metabolic Panel:  Lab Results  Component Value Date   NA 140 10/07/2023   K 3.6 10/07/2023   CO2 23 10/07/2023   GLUCOSE 105 (H) 10/07/2023   BUN 38 (H) 10/07/2023   CREATININE 1.30 (H) 10/07/2023   CALCIUM 9.0 10/07/2023   GFRNONAA 53 (L) 10/07/2023   GFRAA  09/02/2007    >60        The eGFR has been calculated using the MDRD equation. This calculation has not been validated in all clinical situations. eGFR's persistently <60 mL/min signify possible Chronic Kidney Disease.   Lipid Panel:  Lab Results  Component Value Date   LDLCALC 77 03/21/2022   HgbA1c:  Lab Results  Component Value Date   HGBA1C 5.6 12/31/2021   Urine Drug Screen:     Component Value Date/Time   LABOPIA NONE DETECTED 09/29/2021 1549   COCAINSCRNUR NONE DETECTED 09/29/2021 1549   LABBENZ NONE DETECTED 09/29/2021 1549   AMPHETMU NONE DETECTED 09/29/2021 1549   THCU NONE DETECTED 09/29/2021 1549   LABBARB NONE DETECTED 09/29/2021 1549    Alcohol Level     Component Value Date/Time   ETH <10 09/29/2021 1534   INR  Lab Results  Component Value Date   INR 1.0 09/29/2021   APTT  Lab Results  Component Value Date   APTT 28 09/29/2021   AED levels: No results found for: "PHENYTOIN", "ZONISAMIDE", "LAMOTRIGINE", "LEVETIRACETA"   MRI Brain(Personally reviewed): Negative  ASSESSMENT   KAMONI GENTLES is a 89 y.o. male ***  RECOMMENDATIONS  *** ______________________________________________________________________    Stormy Card, MD [facility]

## 2023-10-07 NOTE — ED Notes (Signed)
 Patient transported to CT

## 2023-10-07 NOTE — ED Notes (Signed)
 RN attempted IV placement, unable to obtain access at this time, patient transported to MRI.

## 2023-10-07 NOTE — H&P (Incomplete Revision)
 History and Physical    [name]:[id number] DOB: [birth year] DOA: 10/07/2023  PCP: Lupita Raider, MD   Patient coming from: Home   Chief Complaint:  Chief Complaint  Patient presents with   Cough   Weakness   ED TRIAGE note:Reports patient got choked on food last night and coughed all night and woke up this morning weak and a little confused.  A&Ox 3.  Denies pain any where.  Family reports he normally walks with a walker but they have had to use the wheelchair today.   HPI:  Clarence Myers is a 89 y.o. male with medical history significant of essential hypertension, hyperlipidemia, history of CAD angioplasty 1992, CKD stage II, CVA x 2, chronic aortic enlargement, aortic stenosis-asymptomatic, GERD and vitamin D deficiency presented to emergency department patient reporting that she choked food last night after what happened significant, generalized weakness and develop little confusion.  Currently patient is alert oriented x 3.  Family reported normally patient's use walker at home but today she has been using her wheelchair.  During my evaluation at the bedside patient reported that he had a choking event while eating food last night after that he developed vigorous coughing.  Since this morning he has been feeling more weaker of the bilateral lower extremity.  He has been using wheelchair today denies any focal weakness, dizziness, headache, tingling, tremor, sensory change, seizure and loss of consciousness.  Denies any chest pain, palpitation, headache and blurry vision.  ED Course:  At presentation to ED patient is hemodynamically stable. CMP showing elevated creatinine 1.3, low albumin 3.4 otherwise unremarkable. CBC showing leukocytosis 14.5, stable H&H and normal platelet count. Pending UA, respiratory panel and mag level.  EKG showing sinus rhythm with premature ventricular complex heart rate 63.  There is no ST-T wave abnormality.  MRI of the brain showed  acute small vessel infarction in the left posterior medial cerebrum without any swelling or hemorrhage.Old small vessel ischemic changes throughout the pons, cerebellum, right occipital lobe and cerebral hemispheric white matter. Old hemosiderin deposition in the right parietal region relating to a subcortical infarction.  CT head unremarkable. Chest x-ray no acute disease process.  ED physician Dr.Kirby consulted neurology Dr. Amada Jupiter stated that he will see the patient soon.  Hospitalist has been contacted for further evaluation and management of acute CVA.  Significant labs in the ED: Lab Orders         Resp panel by RT-PCR (RSV, Flu A&B, Covid) Anterior Nasal Swab         Comprehensive metabolic panel         CBC         Urinalysis, w/ Reflex to Culture (Infection Suspected) -Urine, Clean Catch         Magnesium         Lipid panel         Hemoglobin A1c         CBC         Comprehensive metabolic panel         Creatinine, urine, random         Sodium, urine, random       Review of Systems:  Review of Systems  Constitutional:  Negative for chills, diaphoresis, fever, malaise/fatigue and weight loss.  HENT:  Positive for hearing loss.   Eyes:  Negative for blurred vision and double vision.  Respiratory:  Negative for cough, sputum production and shortness of breath.   Cardiovascular:  Negative for  chest pain, palpitations and leg swelling.  Gastrointestinal:  Negative for abdominal pain, heartburn, nausea and vomiting.  Genitourinary:  Negative for dysuria, frequency and urgency.  Musculoskeletal:  Negative for back pain, falls, joint pain, myalgias and neck pain.  Neurological:  Positive for focal weakness. Negative for dizziness, tingling, tremors, sensory change, speech change, seizures, loss of consciousness, weakness and headaches.       Bilateral lower extremity weakness  Endo/Heme/Allergies:  Does not bruise/bleed easily.  Psychiatric/Behavioral:  The patient  does not have insomnia.     Past Medical History:  Diagnosis Date   CAD (coronary artery disease)    Hyperlipidemia    Low back strain    Shoulder injury    Fell and injured Right shoulder   Stroke [facility])     Past Surgical History:  Procedure Laterality Date   ANGIOPLASTY     1992-LAD   CARDIAC CATHETERIZATION       reports that he has never smoked. He has never used smokeless tobacco. He reports that he does not drink alcohol and does not use drugs.  Allergies  Allergen Reactions   Crab Extract Cough, Hives and Swelling   Bee Venom Swelling   Blue Crab (Cavinectes Sapidus) Allergy Skin Test     "Hives"    Other     Bee Stings   Shellfish-Derived Products Hives    History reviewed. No pertinent family history.  Prior to Admission medications   Medication Sig Start Date End Date Taking? Authorizing Provider  acetaminophen (TYLENOL) 325 MG tablet Take 2 tablets (650 mg total) by mouth every 4 (four) hours as needed for mild pain (or temp > 37.5 C (99.5 F)). 10/09/21   Lonia Blood, MD  atropine 1 % ophthalmic solution Place 1 drop into the right eye daily. 10/17/22   [provider]  benzonatate (TESSALON) 100 MG capsule Take 100 mg by mouth at bedtime. Take 2 capsule at night    [provider]  clopidogrel (PLAVIX) 75 MG tablet Take 1 tablet (75 mg total) by mouth daily. RESUME ONLY AFTER COMPLETION OF 30 DAYS OF ASPIRIN AND BRILINTA 01/31/22   Osvaldo Shipper, MD  metoprolol tartrate (LOPRESSOR) 25 MG tablet Take 12.5 mg by mouth 2 (two) times daily. 08/07/21   [provider]  mirtazapine (REMERON) 15 MG tablet Take 15 mg by mouth at bedtime. 11/13/21   [provider]  Multiple Vitamin (MULTI-VITAMIN PO) Take by mouth daily.      [provider]  multivitamin-lutein (OCUVITE-LUTEIN) CAPS Take 1 capsule by mouth daily.      [provider]  Omega-3 Fatty Acids (FISH OIL PO) Take 1,000 mg by mouth in the morning and  at bedtime.    [provider]  omeprazole (PRILOSEC) 20 MG capsule Take 20 mg by mouth daily. 01/21/21   [provider]  prednisoLONE acetate (PRED FORTE) 1 % ophthalmic suspension Place 1 drop into the right eye. 6 times per day. 10/19/22   [provider]  Probiotic Product (PROBIOTIC DAILY PO) Take 1 capsule by mouth daily.    [provider]  RESTASIS 0.05 % ophthalmic emulsion Place 1 drop into both eyes 2 (two) times daily. 10/25/20   [provider]  rosuvastatin (CRESTOR) 20 MG tablet Take 1 tablet (20 mg total) by mouth daily. 12/31/21   Osvaldo Shipper, MD  sertraline (ZOLOFT) 25 MG tablet Take 25 mg by mouth at bedtime.    [provider]  Physical Exam: Vitals:   10/07/23 1520 10/07/23 1834  BP: 129/65 133/67  Pulse: 65 71  Resp: 18 (!) 23  Temp: 97.9 F (36.6 C)   TempSrc: Oral   SpO2: 94% 97%  Weight: 60.8 kg   Height: 5\' 6"  (1.676 m)     Physical Exam Constitutional:      Appearance: He is not ill-appearing.  HENT:     Mouth/Throat:     Mouth: Mucous membranes are moist.  Eyes:     Conjunctiva/sclera: Conjunctivae normal.  Cardiovascular:     Rate and Rhythm: Normal rate and regular rhythm.     Pulses: Normal pulses.     Heart sounds: Normal heart sounds.  Pulmonary:     Effort: Pulmonary effort is normal.     Breath sounds: Normal breath sounds.  Abdominal:     General: Bowel sounds are normal.  Musculoskeletal:        General: No swelling or deformity.     Cervical back: Normal range of motion and neck supple.     Right lower leg: No edema.     Left lower leg: No edema.  Skin:    General: Skin is dry.  Neurological:     Mental Status: He is alert and oriented to person, place, and time.     Cranial Nerves: No cranial nerve deficit.     Sensory: No sensory deficit.     Motor: No weakness.     Coordination: Coordination normal.     Gait: Gait normal.     Deep Tendon Reflexes: Reflexes normal.      Comments: Bilateral lower extremity muscle strength 4/5. Bilateral upper extremities muscle strength 5/5. Left-sided upper extremity dysdiadochokinesia and abnormal coordination.  Psychiatric:        Mood and Affect: Mood normal.      Labs on Admission: I have personally reviewed following labs and imaging studies  CBC: Recent Labs  Lab 10/07/23 1534  WBC 14.5*  HGB 12.7*  HCT 38.5*  MCV 94.4  PLT 242   Basic Metabolic Panel: Recent Labs  Lab 10/07/23 1534  NA 140  K 3.6  CL 106  CO2 23  GLUCOSE 105*  BUN 38*  CREATININE 1.30*  CALCIUM 9.0   GFR: Estimated Creatinine Clearance: 34.4 mL/min (A) (by C-G formula based on SCr of 1.3 mg/dL (H)). Liver Function Tests: Recent Labs  Lab 10/07/23 1534  AST 20  ALT 14  ALKPHOS 57  BILITOT 0.9  PROT 7.5  ALBUMIN 3.4*   No results for input(s): "LIPASE", "AMYLASE" in the last 168 hours. No results for input(s): "AMMONIA" in the last 168 hours. Coagulation Profile: No results for input(s): "INR", "PROTIME" in the last 168 hours. Cardiac Enzymes: No results for input(s): "CKTOTAL", "CKMB", "CKMBINDEX", "TROPONINI", "TROPONINIHS" in the last 168 hours. BNP (last 3 results) No results for input(s): "BNP" in the last 8760 hours. HbA1C: No results for input(s): "HGBA1C" in the last 72 hours. CBG: No results for input(s): "GLUCAP" in the last 168 hours. Lipid Profile: No results for input(s): "CHOL", "HDL", "LDLCALC", "TRIG", "CHOLHDL", "LDLDIRECT" in the last 72 hours. Thyroid Function Tests: No results for input(s): "TSH", "T4TOTAL", "FREET4", "T3FREE", "THYROIDAB" in the last 72 hours. Anemia Panel: No results for input(s): "VITAMINB12", "FOLATE", "FERRITIN", "TIBC", "IRON", "RETICCTPCT" in the last 72 hours. Urine analysis:    Component Value Date/Time   COLORURINE YELLOW 12/29/2021 2002   APPEARANCEUR CLEAR 12/29/2021 2002   LABSPEC 1.009 12/29/2021 2002   PHURINE 5.0  12/29/2021 2002   GLUCOSEU NEGATIVE  12/29/2021 2002   HGBUR NEGATIVE 12/29/2021 2002   BILIRUBINUR NEGATIVE 12/29/2021 2002   KETONESUR NEGATIVE 12/29/2021 2002   PROTEINUR NEGATIVE 12/29/2021 2002   UROBILINOGEN 0.2 09/02/2007 1611   NITRITE NEGATIVE 12/29/2021 2002   LEUKOCYTESUR NEGATIVE 12/29/2021 2002    Radiological Exams on Admission: I have personally reviewed images MR BRAIN WO CONTRAST Result Date: 10/07/2023 CLINICAL DATA:  Transient ischemic attack EXAM: MRI HEAD WITHOUT CONTRAST TECHNIQUE: Multiplanar, multiecho pulse sequences of the brain and surrounding structures were obtained without intravenous contrast. COMPARISON:  Head CT earlier same day FINDINGS: Brain: Diffusion imaging shows an acute small vessel infarction in the left posteromedial cerebellum. No other acute insult. Old small vessel ischemic changes are present throughout the pons. Numerous old small vessel cerebellar infarctions. Old right inferior occipital cortical and subcortical infarction. Chronic small-vessel ischemic changes throughout the cerebral hemispheric white matter. Dilated perivascular spaces in basal ganglia regions. No large vessel territory stroke. No mass, hydrocephalus or extra-axial collection. Old hemosiderin deposition in the right parietal region relating to a subcortical infarction. Vascular: Major vessels at the base of the brain show flow. Skull and upper cervical spine: Negative Sinuses/Orbits: Clear/normal Other: None IMPRESSION: 1. Acute small vessel infarction in the left posteromedial cerebellum. No swelling or hemorrhage. 2. Old small vessel ischemic changes throughout the pons, cerebellum, right occipital lobe and cerebral hemispheric white matter. Old hemosiderin deposition in the right parietal region relating to a subcortical infarction. Electronically Signed   By: Paulina Fusi M.D.   On: 10/07/2023 20:05   CT Head Wo Contrast Result Date: 10/07/2023 CLINICAL DATA:  Mental status change of unknown cause EXAM: CT HEAD  WITHOUT CONTRAST TECHNIQUE: Contiguous axial images were obtained from the base of the skull through the vertex without intravenous contrast. RADIATION DOSE REDUCTION: This exam was performed according to the departmental dose-optimization program which includes automated exposure control, adjustment of the mA and/or kV according to patient size and/or use of iterative reconstruction technique. COMPARISON:  12/30/2021 FINDINGS: Brain: Generalized atrophy without subjective lobar predominance. Extensive chronic small-vessel ischemic changes of pons. Old small vessel infarctions of the cerebellum. Old ischemic changes of the right thalamus. Chronic small-vessel ischemic changes throughout the cerebral hemispheric white matter. No sign of acute infarction, mass lesion, hemorrhage, hydrocephalus or extra-axial collection. Vascular: There is atherosclerotic calcification of the major vessels at the base of the brain. Skull: Negative Sinuses/Orbits: Clear/normal Other: None IMPRESSION: No acute CT finding. Atrophy and chronic small-vessel ischemic changes as outlined above. Old small vessel infarctions of the cerebellum, right thalamus and pons. Electronically Signed   By: Paulina Fusi M.D.   On: 10/07/2023 19:44   DG Chest 2 View Result Date: 10/07/2023 CLINICAL DATA:  Cough. EXAM: CHEST - 2 VIEW COMPARISON:  03/12/2023. FINDINGS: Bilateral lung fields are clear. Bilateral costophrenic angles are clear. Elevated right hemidiaphragm again seen. Stable cardio-mediastinal silhouette. No acute osseous abnormalities. Old healed posterior right sixth rib fracture noted. The soft tissues are within normal limits. IMPRESSION: No active cardiopulmonary disease. Electronically Signed   By: Jules Schick M.D.   On: 10/07/2023 16:31     EKG: My personal interpretation of EKG shows:  EKG showing sinus rhythm with premature ventricular complex heart rate 63.  There is no ST-T wave abnormality.   Assessment/Plan: Principal  Problem:   Acute ischemic stroke [facility]) Active Problems:   CAD (coronary artery disease)   Hyperlipidemia   Essential hypertension   History of CVA x  2 in the past (cerebrovascular accident)   CKD (chronic kidney disease), stage II   GERD (gastroesophageal reflux disease)   Acute kidney injury superimposed on chronic kidney disease (HCC)   Chronic diastolic CHF (congestive heart failure) (HCC)   GAD (generalized anxiety disorder)    Assessment and Plan: Acute ischemic stroke History of previous CVA twice -Patient presenting with an choking event of food last night 10/06/2023.  Today morning patient noticed bilateral lower extremity weakness which is more than usual.  At baseline patient uses walker but today has to use the wheelchair. -History of previous CVA currently on Plavix 75 mg 1 day at home. -Initial presentation to ED patient is hemodynamically stable - CBC unremarkable.  CMP showed evidence of AKI otherwise normal finding.  Normal blood glucose level. - EKG showing normal sinus rhythm and premature ventricular complex, without ST-T wave abnormality. - Chest x-ray no evidence of aspiration. -CT head no acute intracranial finding.  Atrophy and chronic vessel ischemic changes. - MRI showed acute small vessel infraction in the left posteromedial side..  No swelling or hemorrhage.  Old small vessel ischemic changes throughout the pons, cerebellum, right occipital lobe and cerebral hemisphere. -With the concern for acute stroke based on the MRI finding neurology Dr. Amada Jupiter has been consulted in the ED waiting for recommendation - Pending stroke swallow screen. -I have not started home Plavix yet as waiting for stroke swallow screen as well as neurology recommendation regarding further antiplatelet therapy. - Admitting patient for stroke workup - Obtaining CT angiogram of the head and neck, obtain echocardiogram. -Checking A1c and lipid panel - Continue biomarkers of hypertension  for next 24 hours blood pressure goal is below 210/120. - Consulted PT, OT and speech therapy. - Patient will be n.p.o. until stroke swallow screen will be completed. - Continue cardiac monitoring.   Acute kidney injury superimposed CKD stage II -Creatinine trended up to 1.3.  Baseline normal creatinine level and GFR above 60. - AKI on CKD stage II in the setting of dehydration - Pending UA, urine creatinine and sodium.  In the ED patient has been given 1 L of LR.  Continue LR 75 cc/h -Monitor renal function, avoid nephrotoxic agent and renally adjust medications   Food choking event 10/06/2023 Leukocytosis-reactive - CBC showing leukocytosis 14.5.  Chest x-ray no evidence of aspiration.  Concern for reactive leukocytosis in the setting of aspiration from choking last night.  Patient denies any UTI symptoms, cough, fever, nausea, vomiting, abdominal pain and diarrhea. - Continue to check pulse ox. - Continue aspiration precaution - If patient develops fever in that case we will obtain blood cultures. -Consulted SLP.  History of CAD s/p angioplasty 1992 -Once patient will pass swallow screen will resume home Lopressor 12.5 mg twice daily and Plavix 75 mg daily.  Continue Crestor.  Essential hypertension Chronic diastolic heart failure with preserved EF -Permissive hypertension next 24 hours.  Hyperlipidemia -Continue Crestor.  GERD Will resume Protonix after swallow screen  Generalized anxiety disorder Will resume Zoloft after swallow screen   DVT prophylaxis:  SQ Heparin Code Status:  Full Code Diet: Currently n.p.o. Family Communication:   Family was present at bedside, at the time of interview. Opportunity was given to ask question and all questions were answered satisfactorily.  Disposition Plan: Pending CTA head and neck, pending echocardiogram.  Waiting for neurology recommendation tentative discharge to home next 2 to 3 days. Consults: Neurology, PT, OT and SLP  therapy Admission status:   Inpatient, Telemetry bed  Severity of Illness: The appropriate patient status for this patient is INPATIENT. Inpatient status is judged to be reasonable and necessary in order to provide the required intensity of service to ensure the patient's safety. The patient's presenting symptoms, physical exam findings, and initial radiographic and laboratory data in the context of their chronic comorbidities is felt to place them at high risk for further clinical deterioration. Furthermore, it is not anticipated that the patient will be medically stable for discharge from the hospital within 2 midnights of admission.   * I certify that at the point of admission it is my clinical judgment that the patient will require inpatient hospital care spanning beyond 2 midnights from the point of admission due to high intensity of service, high risk for further deterioration and high frequency of surveillance required.Marland Kitchen    Tereasa Coop, MD [facility]  How to contact the [facility] Attending or Consulting provider 7A - 7P or covering provider during after hours 7P -7A, for this patient.  Check the care team in [facility] and look for a) attending/consulting [facility] provider listed and b) the [facility] team listed Log into [email or web address] and use [place]'s universal password to access. If you do not have the password, please contact the hospital operator. Locate the [facility] provider you are looking for under [facility] and page to a number that you can be directly reached. If you still have difficulty reaching the provider, please page the [facility] (Director on Call) for the Hospitalists listed on amion for assistance.  10/07/2023, 9:10 PM

## 2023-10-07 NOTE — ED Provider Notes (Signed)
 [place] EMERGENCY DEPARTMENT AT [facility] Provider Note  Arrival date/time:10/07/2023 8:36 PM  HPI/ROS   Clarence Myers is a 89 y.o. male with PMH significant for HLD, HTN, CAD who presents for generalized weakness  History is provided by patient and daughter.  Per daughter, patient had a choking episode last night that led to multiple episodes of vomiting.  Since he woke up this morning, she is concerned that he has been much weaker.  Usually he uses a walker to get around his house easily, but was not able to walk this morning due to weakness. Patient agrees that he feels much weaker than usual. Daughter also concerned that he may have had an episode of confusion this morning.  He is also been more forgetful today.  He does sometimes forget things, but she feels it has been more pronounced today.  Patient denies any additional symptoms at this time, namely fevers, chills, chest pain, shortness of breath, abdominal pain, nausea, diarrhea, constipation.  Patient lives at home with his wife and has 24/7 support via his children and caretakers.  A complete ROS was performed with pertinent positives/negatives noted above.   ED Course and Medical Decision Making   I personally reviewed the patient's vitals.  Assessment/Plan: This is an 89 year old patient with history of hyperlipidemia, hypertension, CAD who is presenting for generalized weakness.  On exam, patient has no focal deficits, but feels too weak to get up and walk.  Workup: CMP shows mild AKI to 1.3, most recent labs for comparison are from 1 year ago and baseline seems to be between 1.0 and 1.1.  No electrolyte derangement or liver injury. CBC shows mild leukocytosis to 14.5, no anemia.  Will also obtain CT head and MRI brain given report of confusion and inability to ambulate.  Patient's MRI showed concern for cerebellar infarction. Family and patient were updated at bedside.  Neurology was consulted and  will plan to see patient.  Plan to admit patient to hospitalist team in setting of stroke and new deficits with inability to ambulate.  Disposition:  I discussed the case with hospitalist team who graciously agreed to admit the patient to their service for continued care.   Clinical Impression:  1. Cerebellar stroke [facility])     Rx / DC Orders ED Discharge Orders     None       The plan for this patient was discussed with Dr. Silverio Lay, who voiced agreement and who oversaw evaluation and treatment of this patient.   Clinical Complexity A medically appropriate history, review of systems, and physical exam was performed.  Patient's presentation is most consistent with acute presentation with potential threat to life or bodily function.  Medical Decision Making Amount and/or Complexity of Data Reviewed Labs: ordered. Radiology: ordered.  Risk Decision regarding hospitalization.    Physical Exam and Medical History   Vitals:   10/07/23 1520 10/07/23 1834  BP: 129/65 133/67  Pulse: 65 71  Resp: 18 (!) 23  Temp: 97.9 F (36.6 C)   TempSrc: Oral   SpO2: 94% 97%  Weight: 60.8 kg   Height: 5\' 6"  (1.676 m)     Physical Exam Vitals and nursing note reviewed.  Constitutional:      General: He is not in acute distress.    Appearance: He is well-developed.  HENT:     Head: Normocephalic and atraumatic.  Eyes:     Conjunctiva/sclera: Conjunctivae normal.  Cardiovascular:     Rate and Rhythm: Normal rate  and regular rhythm.     Heart sounds: No murmur heard. Pulmonary:     Effort: Pulmonary effort is normal. No respiratory distress.     Breath sounds: Normal breath sounds.  Abdominal:     Palpations: Abdomen is soft.     Tenderness: There is no abdominal tenderness.  Musculoskeletal:        General: No swelling.     Cervical back: Neck supple.  Skin:    General: Skin is warm and dry.     Capillary Refill: Capillary refill takes less than 2 seconds.  Neurological:      Mental Status: He is alert.  Psychiatric:        Mood and Affect: Mood normal.     Medical History: Allergies  Allergen Reactions   Crab Extract Cough, Hives and Swelling   Bee Venom Swelling   Blue Crab (Cavinectes Sapidus) Allergy Skin Test     "Hives"    Other     Bee Stings   Shellfish-Derived Products Hives   Past Medical History:  Diagnosis Date   CAD (coronary artery disease)    Hyperlipidemia    Low back strain    Shoulder injury    Fell and injured Right shoulder   Stroke [facility])     Past Surgical History:  Procedure Laterality Date   ANGIOPLASTY     1992-LAD   CARDIAC CATHETERIZATION     History reviewed. No pertinent family history.  Social History   Tobacco Use   Smoking status: Never   Smokeless tobacco: Never  Vaping Use   Vaping status: Never Used  Substance Use Topics   Alcohol use: Never   Drug use: Never    Procedures   If procedures were preformed on this patient, they are listed below:  Procedures   -------- HPI and MDM generated using voice dictation software and may contain dictation errors. Please contact me for any clarification or with any questions.   Cephus Slater, MD Emergency Medicine PGY-2    Caron Presume, MD 10/07/23 2036    Charlynne Pander, MD 10/08/23 1058

## 2023-10-07 NOTE — ED Provider Triage Note (Signed)
 Emergency Medicine Provider Triage Evaluation Note  Clarence Myers , a 89 y.o. male  was evaluated in triage.  Pt complains of cough and weakness.  States he choked on some food last night and then vomited.  He then coughed all night woke up this morning feeling weak and more confused.  Family member reports that he did have a stroke in does have right-sided deficits since that stroke.  States he may have been leaning a little bit more to the right more than usual this morning when he woke up around  8 30 am  Review of Systems  Positive: See above Negative: See above  Physical Exam  BP 129/65 (BP Location: Right Arm)   Pulse 65   Temp 97.9 F (36.6 C) (Oral)   Resp 18   Ht 5\' 6"  (1.676 m)   Wt 60.8 kg   SpO2 94%   BMI 21.63 kg/m  Gen:   Awake, no distress   Resp:  Normal effort  MSK:   Moves extremities without difficulty  Other:    Medical Decision Making  Medically screening exam initiated at 3:31 PM.  Appropriate orders placed.  MICHA DOSANJH was informed that the remainder of the evaluation will be completed by another provider, this initial triage assessment does not replace that evaluation, and the importance of remaining in the ED until their evaluation is complete.  Work up started   Gareth Eagle, [place] 10/07/23 [phone]

## 2023-10-07 NOTE — H&P (Signed)
 History and Physical    AVERI CACIOPPO ZOX:[id number] DOB: 1934/05/31 DOA: 10/07/2023  PCP: Lupita Raider, MD   Patient coming from: Home   Chief Complaint:  Chief Complaint  Patient presents with   Cough   Weakness   ED TRIAGE note:Reports patient got choked on food last night and coughed all night and woke up this morning weak and a little confused.  A&Ox 3.  Denies pain any where.  Family reports he normally walks with a walker but they have had to use the wheelchair today.   HPI:  Clarence Myers is a 89 y.o. male with medical history significant of essential hypertension, hyperlipidemia, history of CAD angioplasty 1992, CKD stage II, CVA x 2, chronic aortic enlargement, aortic stenosis-asymptomatic, GERD and vitamin D deficiency presented to emergency department patient reporting that she choked food last night after what happened significant, generalized weakness and develop little confusion.  Currently patient is alert oriented x 3.  Family reported normally patient's use walker at home but today she has been using her wheelchair.  During my evaluation at the bedside patient reported that he had a choking event while eating food last night after that he developed vigorous coughing.  Since this morning he has been feeling more weaker of the bilateral lower extremity.  He has been using wheelchair today denies any focal weakness, dizziness, headache, tingling, tremor, sensory change, seizure and loss of consciousness.  Denies any chest pain, palpitation, headache and blurry vision.  ED Course:  At presentation to ED patient is hemodynamically stable. CMP showing elevated creatinine 1.3, low albumin 3.4 otherwise unremarkable. CBC showing leukocytosis 14.5, stable H&H and normal platelet count. Pending UA, respiratory panel and mag level.  EKG showing sinus rhythm with premature ventricular complex heart rate 63.  There is no ST-T wave abnormality.  MRI of the brain showed  acute small vessel infarction in the left posterior medial cerebrum without any swelling or hemorrhage.Old small vessel ischemic changes throughout the pons, cerebellum, right occipital lobe and cerebral hemispheric white matter. Old hemosiderin deposition in the right parietal region relating to a subcortical infarction.  CT head unremarkable. Chest x-ray no acute disease process.  ED physician Dr.Kirby consulted neurology Dr. Amada Jupiter stated that he will see the patient soon.  Hospitalist has been contacted for further evaluation and management of acute CVA.  Significant labs in the ED: Lab Orders         Resp panel by RT-PCR (RSV, Flu A&B, Covid) Anterior Nasal Swab         Comprehensive metabolic panel         CBC         Urinalysis, w/ Reflex to Culture (Infection Suspected) -Urine, Clean Catch         Magnesium         Lipid panel         Hemoglobin A1c         CBC         Comprehensive metabolic panel         Creatinine, urine, random         Sodium, urine, random       Review of Systems:  Review of Systems  Constitutional:  Negative for chills, diaphoresis, fever, malaise/fatigue and weight loss.  HENT:  Positive for hearing loss.   Eyes:  Negative for blurred vision and double vision.  Respiratory:  Negative for cough, sputum production and shortness of breath.   Cardiovascular:  Negative for  chest pain, palpitations and leg swelling.  Gastrointestinal:  Negative for abdominal pain, heartburn, nausea and vomiting.  Genitourinary:  Negative for dysuria, frequency and urgency.  Musculoskeletal:  Negative for back pain, falls, joint pain, myalgias and neck pain.  Neurological:  Positive for focal weakness. Negative for dizziness, tingling, tremors, sensory change, speech change, seizures, loss of consciousness, weakness and headaches.       Bilateral lower extremity weakness  Endo/Heme/Allergies:  Does not bruise/bleed easily.  Psychiatric/Behavioral:  The patient  does not have insomnia.     Past Medical History:  Diagnosis Date   CAD (coronary artery disease)    Hyperlipidemia    Low back strain    Shoulder injury    Fell and injured Right shoulder   Stroke [facility])     Past Surgical History:  Procedure Laterality Date   ANGIOPLASTY     1992-LAD   CARDIAC CATHETERIZATION       reports that he has never smoked. He has never used smokeless tobacco. He reports that he does not drink alcohol and does not use drugs.  Allergies  Allergen Reactions   Crab Extract Cough, Hives and Swelling   Bee Venom Swelling   Blue Crab (Cavinectes Sapidus) Allergy Skin Test     "Hives"    Other     Bee Stings   Shellfish-Derived Products Hives    History reviewed. No pertinent family history.  Prior to Admission medications   Medication Sig Start Date End Date Taking? Authorizing Provider  acetaminophen (TYLENOL) 325 MG tablet Take 2 tablets (650 mg total) by mouth every 4 (four) hours as needed for mild pain (or temp > 37.5 C (99.5 F)). 10/09/21   Lonia Blood, MD  atropine 1 % ophthalmic solution Place 1 drop into the right eye daily. 10/17/22   [provider]  benzonatate (TESSALON) 100 MG capsule Take 100 mg by mouth at bedtime. Take 2 capsule at night    [provider]  clopidogrel (PLAVIX) 75 MG tablet Take 1 tablet (75 mg total) by mouth daily. RESUME ONLY AFTER COMPLETION OF 30 DAYS OF ASPIRIN AND BRILINTA 01/31/22   Osvaldo Shipper, MD  metoprolol tartrate (LOPRESSOR) 25 MG tablet Take 12.5 mg by mouth 2 (two) times daily. 08/07/21   [provider]  mirtazapine (REMERON) 15 MG tablet Take 15 mg by mouth at bedtime. 11/13/21   [provider]  Multiple Vitamin (MULTI-VITAMIN PO) Take by mouth daily.      [provider]  multivitamin-lutein (OCUVITE-LUTEIN) CAPS Take 1 capsule by mouth daily.      [provider]  Omega-3 Fatty Acids (FISH OIL PO) Take 1,000 mg by mouth in the morning and  at bedtime.    [provider]  omeprazole (PRILOSEC) 20 MG capsule Take 20 mg by mouth daily. 01/21/21   [provider]  prednisoLONE acetate (PRED FORTE) 1 % ophthalmic suspension Place 1 drop into the right eye. 6 times per day. 10/19/22   [provider]  Probiotic Product (PROBIOTIC DAILY PO) Take 1 capsule by mouth daily.    [provider]  RESTASIS 0.05 % ophthalmic emulsion Place 1 drop into both eyes 2 (two) times daily. 10/25/20   [provider]  rosuvastatin (CRESTOR) 20 MG tablet Take 1 tablet (20 mg total) by mouth daily. 12/31/21   Osvaldo Shipper, MD  sertraline (ZOLOFT) 25 MG tablet Take 25 mg by mouth at bedtime.    [provider]  Physical Exam: Vitals:   10/07/23 1520 10/07/23 1834  BP: 129/65 133/67  Pulse: 65 71  Resp: 18 (!) 23  Temp: 97.9 F (36.6 C)   TempSrc: Oral   SpO2: 94% 97%  Weight: 60.8 kg   Height: 5\' 6"  (1.676 m)     Physical Exam Constitutional:      Appearance: He is not ill-appearing.  HENT:     Mouth/Throat:     Mouth: Mucous membranes are moist.  Eyes:     Conjunctiva/sclera: Conjunctivae normal.  Cardiovascular:     Rate and Rhythm: Normal rate and regular rhythm.     Pulses: Normal pulses.     Heart sounds: Normal heart sounds.  Pulmonary:     Effort: Pulmonary effort is normal.     Breath sounds: Normal breath sounds.  Abdominal:     General: Bowel sounds are normal.  Musculoskeletal:        General: No swelling or deformity.     Cervical back: Normal range of motion and neck supple.     Right lower leg: No edema.     Left lower leg: No edema.  Skin:    General: Skin is dry.  Neurological:     Mental Status: He is alert and oriented to person, place, and time.     Cranial Nerves: No cranial nerve deficit.     Sensory: No sensory deficit.     Motor: No weakness.     Coordination: Coordination normal.     Gait: Gait normal.     Deep Tendon Reflexes: Reflexes normal.      Comments: Bilateral lower extremity muscle strength 4/5. Bilateral upper extremities muscle strength 5/5. Left-sided upper extremity dysdiadochokinesia and abnormal coordination.  Psychiatric:        Mood and Affect: Mood normal.      Labs on Admission: I have personally reviewed following labs and imaging studies  CBC: Recent Labs  Lab 10/07/23 1534  WBC 14.5*  HGB 12.7*  HCT 38.5*  MCV 94.4  PLT 242   Basic Metabolic Panel: Recent Labs  Lab 10/07/23 1534  NA 140  K 3.6  CL 106  CO2 23  GLUCOSE 105*  BUN 38*  CREATININE 1.30*  CALCIUM 9.0   GFR: Estimated Creatinine Clearance: 34.4 mL/min (A) (by C-G formula based on SCr of 1.3 mg/dL (H)). Liver Function Tests: Recent Labs  Lab 10/07/23 1534  AST 20  ALT 14  ALKPHOS 57  BILITOT 0.9  PROT 7.5  ALBUMIN 3.4*   No results for input(s): "LIPASE", "AMYLASE" in the last 168 hours. No results for input(s): "AMMONIA" in the last 168 hours. Coagulation Profile: No results for input(s): "INR", "PROTIME" in the last 168 hours. Cardiac Enzymes: No results for input(s): "CKTOTAL", "CKMB", "CKMBINDEX", "TROPONINI", "TROPONINIHS" in the last 168 hours. BNP (last 3 results) No results for input(s): "BNP" in the last 8760 hours. HbA1C: No results for input(s): "HGBA1C" in the last 72 hours. CBG: No results for input(s): "GLUCAP" in the last 168 hours. Lipid Profile: No results for input(s): "CHOL", "HDL", "LDLCALC", "TRIG", "CHOLHDL", "LDLDIRECT" in the last 72 hours. Thyroid Function Tests: No results for input(s): "TSH", "T4TOTAL", "FREET4", "T3FREE", "THYROIDAB" in the last 72 hours. Anemia Panel: No results for input(s): "VITAMINB12", "FOLATE", "FERRITIN", "TIBC", "IRON", "RETICCTPCT" in the last 72 hours. Urine analysis:    Component Value Date/Time   COLORURINE YELLOW 12/29/2021 2002   APPEARANCEUR CLEAR 12/29/2021 2002   LABSPEC 1.009 12/29/2021 2002   PHURINE 5.0  12/29/2021 2002   GLUCOSEU NEGATIVE  12/29/2021 2002   HGBUR NEGATIVE 12/29/2021 2002   BILIRUBINUR NEGATIVE 12/29/2021 2002   KETONESUR NEGATIVE 12/29/2021 2002   PROTEINUR NEGATIVE 12/29/2021 2002   UROBILINOGEN 0.2 09/02/2007 1611   NITRITE NEGATIVE 12/29/2021 2002   LEUKOCYTESUR NEGATIVE 12/29/2021 2002    Radiological Exams on Admission: I have personally reviewed images MR BRAIN WO CONTRAST Result Date: 10/07/2023 CLINICAL DATA:  Transient ischemic attack EXAM: MRI HEAD WITHOUT CONTRAST TECHNIQUE: Multiplanar, multiecho pulse sequences of the brain and surrounding structures were obtained without intravenous contrast. COMPARISON:  Head CT earlier same day FINDINGS: Brain: Diffusion imaging shows an acute small vessel infarction in the left posteromedial cerebellum. No other acute insult. Old small vessel ischemic changes are present throughout the pons. Numerous old small vessel cerebellar infarctions. Old right inferior occipital cortical and subcortical infarction. Chronic small-vessel ischemic changes throughout the cerebral hemispheric white matter. Dilated perivascular spaces in basal ganglia regions. No large vessel territory stroke. No mass, hydrocephalus or extra-axial collection. Old hemosiderin deposition in the right parietal region relating to a subcortical infarction. Vascular: Major vessels at the base of the brain show flow. Skull and upper cervical spine: Negative Sinuses/Orbits: Clear/normal Other: None IMPRESSION: 1. Acute small vessel infarction in the left posteromedial cerebellum. No swelling or hemorrhage. 2. Old small vessel ischemic changes throughout the pons, cerebellum, right occipital lobe and cerebral hemispheric white matter. Old hemosiderin deposition in the right parietal region relating to a subcortical infarction. Electronically Signed   By: Paulina Fusi M.D.   On: 10/07/2023 20:05   CT Head Wo Contrast Result Date: 10/07/2023 CLINICAL DATA:  Mental status change of unknown cause EXAM: CT HEAD  WITHOUT CONTRAST TECHNIQUE: Contiguous axial images were obtained from the base of the skull through the vertex without intravenous contrast. RADIATION DOSE REDUCTION: This exam was performed according to the departmental dose-optimization program which includes automated exposure control, adjustment of the mA and/or kV according to patient size and/or use of iterative reconstruction technique. COMPARISON:  12/30/2021 FINDINGS: Brain: Generalized atrophy without subjective lobar predominance. Extensive chronic small-vessel ischemic changes of pons. Old small vessel infarctions of the cerebellum. Old ischemic changes of the right thalamus. Chronic small-vessel ischemic changes throughout the cerebral hemispheric white matter. No sign of acute infarction, mass lesion, hemorrhage, hydrocephalus or extra-axial collection. Vascular: There is atherosclerotic calcification of the major vessels at the base of the brain. Skull: Negative Sinuses/Orbits: Clear/normal Other: None IMPRESSION: No acute CT finding. Atrophy and chronic small-vessel ischemic changes as outlined above. Old small vessel infarctions of the cerebellum, right thalamus and pons. Electronically Signed   By: Paulina Fusi M.D.   On: 10/07/2023 19:44   DG Chest 2 View Result Date: 10/07/2023 CLINICAL DATA:  Cough. EXAM: CHEST - 2 VIEW COMPARISON:  03/12/2023. FINDINGS: Bilateral lung fields are clear. Bilateral costophrenic angles are clear. Elevated right hemidiaphragm again seen. Stable cardio-mediastinal silhouette. No acute osseous abnormalities. Old healed posterior right sixth rib fracture noted. The soft tissues are within normal limits. IMPRESSION: No active cardiopulmonary disease. Electronically Signed   By: Jules Schick M.D.   On: 10/07/2023 16:31     EKG: My personal interpretation of EKG shows:  EKG showing sinus rhythm with premature ventricular complex heart rate 63.  There is no ST-T wave abnormality.   Assessment/Plan: Principal  Problem:   Acute ischemic stroke [facility]) Active Problems:   CAD (coronary artery disease)   Hyperlipidemia   Essential hypertension   History of CVA x  2 in the past (cerebrovascular accident)   CKD (chronic kidney disease), stage II   GERD (gastroesophageal reflux disease)   Acute kidney injury superimposed on chronic kidney disease (HCC)   Chronic diastolic CHF (congestive heart failure) (HCC)   GAD (generalized anxiety disorder)    Assessment and Plan: Acute ischemic stroke History of previous CVA twice -Patient presenting with an choking event of food last night 10/06/2023.  Today morning patient noticed bilateral lower extremity weakness which is more than usual.  At baseline patient uses walker but today has to use the wheelchair. -History of previous CVA currently on Plavix 75 mg 1 day at home. -Initial presentation to ED patient is hemodynamically stable - CBC unremarkable.  CMP showed evidence of AKI otherwise normal finding.  Normal blood glucose level. - EKG showing normal sinus rhythm and premature ventricular complex, without ST-T wave abnormality. - Chest x-ray no evidence of aspiration. -CT head no acute intracranial finding.  Atrophy and chronic vessel ischemic changes. - MRI showed acute small vessel infraction in the left posteromedial side..  No swelling or hemorrhage.  Old small vessel ischemic changes throughout the pons, cerebellum, right occipital lobe and cerebral hemisphere. -With the concern for acute stroke based on the MRI finding neurology Dr. Amada Jupiter has been consulted in the ED waiting for recommendation - Pending stroke swallow screen. -I have not started home Plavix yet as waiting for stroke swallow screen as well as neurology recommendation regarding further antiplatelet therapy. - Admitting patient for stroke workup - Obtaining CT angiogram of the head and neck, obtain echocardiogram. -Checking A1c and lipid panel - Continue biomarkers of hypertension  for next 24 hours blood pressure goal is below 210/120. - Consulted PT, OT and speech therapy. - Patient will be n.p.o. until stroke swallow screen will be completed. - Continue cardiac monitoring.   Acute kidney injury superimposed CKD stage II -Creatinine trended up to 1.3.  Baseline normal creatinine level and GFR above 60. - AKI on CKD stage II in the setting of dehydration - Pending UA, urine creatinine and sodium.  In the ED patient has been given 1 L of LR.  Continue LR 75 cc/h -Monitor renal function, avoid nephrotoxic agent and renally adjust medications   Food choking event 10/06/2023 Leukocytosis-reactive - CBC showing leukocytosis 14.5.  Chest x-ray no evidence of aspiration.  Concern for reactive leukocytosis in the setting of aspiration from choking last night.  Patient denies any UTI symptoms, cough, fever, nausea, vomiting, abdominal pain and diarrhea. - Continue to check pulse ox. - Continue aspiration precaution - If patient develops fever in that case we will obtain blood cultures. -Consulted SLP.  History of CAD s/p angioplasty 1992 -Once patient will pass swallow screen will resume home Lopressor 12.5 mg twice daily and Plavix 75 mg daily.  Continue Crestor.  Essential hypertension Chronic diastolic heart failure with preserved EF -Permissive hypertension next 24 hours.  Hyperlipidemia -Continue Crestor.  GERD Will resume Protonix after swallow screen  Generalized anxiety disorder Will resume Zoloft after swallow screen   DVT prophylaxis:  SQ Heparin Code Status:  Full Code Diet: Currently n.p.o. Family Communication:   Family was present at bedside, at the time of interview. Opportunity was given to ask question and all questions were answered satisfactorily.  Disposition Plan: Pending CTA head and neck, pending echocardiogram.  Waiting for neurology recommendation tentative discharge to home next 2 to 3 days. Consults: Neurology, PT, OT and SLP  therapy Admission status:   Inpatient, Telemetry bed  Severity of Illness: The appropriate patient status for this patient is INPATIENT. Inpatient status is judged to be reasonable and necessary in order to provide the required intensity of service to ensure the patient's safety. The patient's presenting symptoms, physical exam findings, and initial radiographic and laboratory data in the context of their chronic comorbidities is felt to place them at high risk for further clinical deterioration. Furthermore, it is not anticipated that the patient will be medically stable for discharge from the hospital within 2 midnights of admission.   * I certify that at the point of admission it is my clinical judgment that the patient will require inpatient hospital care spanning beyond 2 midnights from the point of admission due to high intensity of service, high risk for further deterioration and high frequency of surveillance required.Marland Kitchen    Tereasa Coop, MD [facility]  How to contact the [facility] Attending or Consulting provider 7A - 7P or covering provider during after hours 7P -7A, for this patient.  Check the care team in [facility] and look for a) attending/consulting [facility] provider listed and b) the [facility] team listed Log into [email or web address] and use [place]'s universal password to access. If you do not have the password, please contact the hospital operator. Locate the [facility] provider you are looking for under [facility] and page to a number that you can be directly reached. If you still have difficulty reaching the provider, please page the [facility] (Director on Call) for the Hospitalists listed on amion for assistance.  10/07/2023, 9:10 PM

## 2023-10-08 ENCOUNTER — Other Ambulatory Visit (HOSPITAL_COMMUNITY): Payer: PPO

## 2023-10-08 ENCOUNTER — Inpatient Hospital Stay (HOSPITAL_COMMUNITY): Payer: PPO

## 2023-10-08 DIAGNOSIS — I6340 Cerebral infarction due to embolism of unspecified cerebral artery: Secondary | ICD-10-CM | POA: Diagnosis not present

## 2023-10-08 DIAGNOSIS — I6389 Other cerebral infarction: Secondary | ICD-10-CM

## 2023-10-08 DIAGNOSIS — I639 Cerebral infarction, unspecified: Secondary | ICD-10-CM | POA: Diagnosis not present

## 2023-10-08 LAB — LIPID PANEL
Cholesterol: 101 mg/dL (ref 0–200)
HDL: 48 mg/dL (ref >40)
LDL Cholesterol: 45 mg/dL (ref 0–99)
Total CHOL/HDL Ratio: 2.1 {ratio}
Triglycerides: 42 mg/dL (ref <150)
VLDL: 8 mg/dL (ref 0–40)

## 2023-10-08 LAB — COMPREHENSIVE METABOLIC PANEL
ALT: 14 U/L (ref 0–44)
AST: 16 U/L (ref 15–41)
Albumin: 3 g/dL — ABNORMAL LOW (ref 3.5–5.0)
Alkaline Phosphatase: 47 U/L (ref 38–126)
Anion gap: 12 (ref 5–15)
BUN: 34 mg/dL — ABNORMAL HIGH (ref 8–23)
CO2: 22 mmol/L (ref 22–32)
Calcium: 8.6 mg/dL — ABNORMAL LOW (ref 8.9–10.3)
Chloride: 104 mmol/L (ref 98–111)
Creatinine, Ser: 1.06 mg/dL (ref 0.61–1.24)
GFR, Estimated: >60 mL/min (ref >60)
Glucose, Bld: 106 mg/dL — ABNORMAL HIGH (ref 70–99)
Potassium: 3.6 mmol/L (ref 3.5–5.1)
Sodium: 138 mmol/L (ref 135–145)
Total Bilirubin: 0.9 mg/dL (ref 0.0–1.2)
Total Protein: 6.5 g/dL (ref 6.5–8.1)

## 2023-10-08 LAB — CBC
HCT: 33.4 % — ABNORMAL LOW (ref 39.0–52.0)
Hemoglobin: 10.8 g/dL — ABNORMAL LOW (ref 13.0–17.0)
MCH: 30.9 pg (ref 26.0–34.0)
MCHC: 32.3 g/dL (ref 30.0–36.0)
MCV: 95.4 fL (ref 80.0–100.0)
Platelets: 211 10*3/uL (ref 150–400)
RBC: 3.5 MIL/uL — ABNORMAL LOW (ref 4.22–5.81)
RDW: 13.3 % (ref 11.5–15.5)
WBC: 11.3 10*3/uL — ABNORMAL HIGH (ref 4.0–10.5)
nRBC: 0 % (ref 0.0–0.2)

## 2023-10-08 LAB — ECHOCARDIOGRAM COMPLETE
AR max vel: 1.15 cm2
AV Area VTI: 1.06 cm2
AV Area mean vel: 0.99 cm2
AV Mean grad: 17 mm[Hg]
AV Peak grad: 27.2 mm[Hg]
Ao pk vel: 2.61 m/s
Area-P 1/2: 3.12 cm2
Height: 66 in
S' Lateral: 3.1 cm
Weight: 2144 [oz_av]

## 2023-10-08 LAB — RESP PANEL BY RT-PCR (RSV, FLU A&B, COVID)  RVPGX2
Influenza A by PCR: NEGATIVE
Influenza B by PCR: NEGATIVE
Resp Syncytial Virus by PCR: NEGATIVE
SARS Coronavirus 2 by RT PCR: NEGATIVE

## 2023-10-08 LAB — MAGNESIUM: Magnesium: 1.9 mg/dL (ref 1.7–2.4)

## 2023-10-08 LAB — HEMOGLOBIN A1C
Hgb A1c MFr Bld: 5.6 % (ref 4.8–5.6)
Mean Plasma Glucose: 114.02 mg/dL

## 2023-10-08 MED ORDER — ORAL CARE MOUTH RINSE: 15.0000 mL | OROMUCOSAL | Status: DC | PRN

## 2023-10-08 MED ORDER — ASPIRIN 81 MG PO TBEC: 81.0000 mg | DELAYED_RELEASE_TABLET | Freq: Every day | ORAL | 2 refills | Status: AC

## 2023-10-08 NOTE — Progress Notes (Signed)
 STROKE TEAM PROGRESS NOTE   SUBJECTIVE (INTERVAL HISTORY) His daughter is at the bedside.  Overall his condition is rapidly improving.  Patient sitting in chair for lunch, he stated that he felt good and walked with OT today with walker, seemed well.  However pending PT evaluation.  He had drastic choking on food and coughing all night long the night before symptoms started.  Taking Plavix and Crestor at home.   OBJECTIVE Temp:  [97.8 F (36.6 C)-98.3 F (36.8 C)] 98.2 F (36.8 C) (02/18 1256) Pulse Rate:  [62-76] 74 (02/18 1256) Cardiac Rhythm: Normal sinus rhythm (02/18 1022) Resp:  [16-25] 16 (02/18 1256) BP: (98-136)/(54-76) 129/67 (02/18 1256) SpO2:  [91 %-97 %] 91 % (02/18 1256) Weight:  [60.8 kg] 60.8 kg (02/17 1520)  No results for input(s): "GLUCAP" in the last 168 hours. Recent Labs  Lab 10/07/23 1534 10/08/23 0105  NA 140 138  K 3.6 3.6  CL 106 104  CO2 23 22  GLUCOSE 105* 106*  BUN 38* 34*  CREATININE 1.30* 1.06  CALCIUM 9.0 8.6*  MG  --  1.9   Recent Labs  Lab 10/07/23 1534 10/08/23 0105  AST 20 16  ALT 14 14  ALKPHOS 57 47  BILITOT 0.9 0.9  PROT 7.5 6.5  ALBUMIN 3.4* 3.0*   Recent Labs  Lab 10/07/23 1534 10/08/23 0430  WBC 14.5* 11.3*  HGB 12.7* 10.8*  HCT 38.5* 33.4*  MCV 94.4 95.4  PLT 242 211   No results for input(s): "CKTOTAL", "CKMB", "CKMBINDEX", "TROPONINI" in the last 168 hours. No results for input(s): "LABPROT", "INR" in the last 72 hours. No results for input(s): "COLORURINE", "LABSPEC", "PHURINE", "GLUCOSEU", "HGBUR", "BILIRUBINUR", "KETONESUR", "PROTEINUR", "UROBILINOGEN", "NITRITE", "LEUKOCYTESUR" in the last 72 hours.  Invalid input(s): "APPERANCEUR"     Component Value Date/Time   CHOL 101 10/08/2023 0430   CHOL 156 03/21/2022 1423   TRIG 42 10/08/2023 0430   HDL 48 10/08/2023 0430   HDL 46 03/21/2022 1423   CHOLHDL 2.1 10/08/2023 0430   VLDL 8 10/08/2023 0430   LDLCALC 45 10/08/2023 0430   LDLCALC 77 03/21/2022 1423    Lab Results  Component Value Date   HGBA1C 5.6 10/08/2023      Component Value Date/Time   LABOPIA NONE DETECTED 09/29/2021 1549   COCAINSCRNUR NONE DETECTED 09/29/2021 1549   LABBENZ NONE DETECTED 09/29/2021 1549   AMPHETMU NONE DETECTED 09/29/2021 1549   THCU NONE DETECTED 09/29/2021 1549   LABBARB NONE DETECTED 09/29/2021 1549    No results for input(s): "ETH" in the last 168 hours.  I have personally reviewed the radiological images below and agree with the radiology interpretations.  VAS [place] LOWER EXTREMITY VENOUS (DVT) Result Date: 10/08/2023  Lower Venous DVT Study Patient Name:  SAVA PROBY  Date of Exam:   10/08/2023 Medical Rec #: [id number]          Accession #:    [id number] Date of Birth: 03-08-1934          Patient Gender: M Patient Age:   24 years Exam Location:  [facility] Procedure:      VAS [place] LOWER EXTREMITY VENOUS (DVT) Referring Phys: Scheryl Marten Messiyah Waterson --------------------------------------------------------------------------------  Indications: Stroke.  Risk Factors: None identified. Comparison Study: None. Performing Technologist: Shona Simpson  Examination Guidelines: A complete evaluation includes B-mode imaging, spectral Doppler, color Doppler, and power Doppler as needed of all accessible portions of each vessel. Bilateral testing is considered an integral part of a complete examination.  Limited examinations for reoccurring indications may be performed as noted. The reflux portion of the exam is performed with the patient in reverse Trendelenburg.  +---------+---------------+---------+-----------+----------+--------------+ RIGHT    CompressibilityPhasicitySpontaneityPropertiesThrombus Aging +---------+---------------+---------+-----------+----------+--------------+ CFV      Full           Yes      Yes                                 +---------+---------------+---------+-----------+----------+--------------+ SFJ      Full                                                         +---------+---------------+---------+-----------+----------+--------------+ FV Prox  Full                                                        +---------+---------------+---------+-----------+----------+--------------+ FV Mid   Full                                                        +---------+---------------+---------+-----------+----------+--------------+ FV DistalFull                                                        +---------+---------------+---------+-----------+----------+--------------+ PFV      Full                                                        +---------+---------------+---------+-----------+----------+--------------+ POP      Full           Yes      Yes                                 +---------+---------------+---------+-----------+----------+--------------+ PTV      Full                                                        +---------+---------------+---------+-----------+----------+--------------+ PERO     Full                                                        +---------+---------------+---------+-----------+----------+--------------+   +---------+---------------+---------+-----------+----------+--------------+ LEFT     CompressibilityPhasicitySpontaneityPropertiesThrombus Aging +---------+---------------+---------+-----------+----------+--------------+ CFV      Full  Yes      Yes                                 +---------+---------------+---------+-----------+----------+--------------+ SFJ      Full                                                        +---------+---------------+---------+-----------+----------+--------------+ FV Prox  Full                                                        +---------+---------------+---------+-----------+----------+--------------+ FV Mid   Full                                                         +---------+---------------+---------+-----------+----------+--------------+ FV DistalFull                                                        +---------+---------------+---------+-----------+----------+--------------+ PFV      Full                                                        +---------+---------------+---------+-----------+----------+--------------+ POP      Full           Yes      Yes                                 +---------+---------------+---------+-----------+----------+--------------+ PTV      Full                                                        +---------+---------------+---------+-----------+----------+--------------+ PERO     Full                                                        +---------+---------------+---------+-----------+----------+--------------+     Summary: BILATERAL: - No evidence of deep vein thrombosis seen in the lower extremities, bilaterally. -No evidence of popliteal cyst, bilaterally.   *See table(s) above for measurements and observations.    Preliminary    CT ANGIO HEAD NECK W WO CM Result Date: 10/07/2023 CLINICAL DATA:  Stroke/TIA EXAM: CT ANGIOGRAPHY HEAD AND NECK WITH AND WITHOUT CONTRAST TECHNIQUE: Multidetector CT imaging of the head  and neck was performed using the standard protocol during bolus administration of intravenous contrast. Multiplanar CT image reconstructions and MIPs were obtained to evaluate the vascular anatomy. Carotid stenosis measurements (when applicable) are obtained utilizing NASCET criteria, using the distal internal carotid diameter as the denominator. RADIATION DOSE REDUCTION: This exam was performed according to the departmental dose-optimization program which includes automated exposure control, adjustment of the mA and/or kV according to patient size and/or use of iterative reconstruction technique. CONTRAST:  75mL OMNIPAQUE IOHEXOL 350 MG/ML SOLN COMPARISON:  12/30/2021 FINDINGS: CTA NECK  FINDINGS Skeleton: No acute abnormality or high grade bony spinal canal stenosis. Other neck: Normal pharynx, larynx and major salivary glands. No cervical lymphadenopathy. Unremarkable thyroid gland. Upper chest: No pneumothorax or pleural effusion. No nodules or masses. Aortic arch: There is calcific atherosclerosis of the aortic arch. Conventional 3 vessel aortic branching pattern. RIGHT carotid system: No dissection, occlusion or aneurysm. Mild atherosclerotic calcification at the carotid bifurcation without hemodynamically significant stenosis. LEFT carotid system: No dissection, occlusion or aneurysm. Mild atherosclerotic calcification at the carotid bifurcation without hemodynamically significant stenosis. Vertebral arteries: Left dominant configuration. Mild narrowing of both vertebral artery origins. Otherwise, the vertebral arteries are normal to the skull base. CTA HEAD FINDINGS POSTERIOR CIRCULATION: Calcific atherosclerosis of the left V4 segment with less than 50% stenosis. No proximal occlusion of the anterior or inferior cerebellar arteries. Basilar artery is normal. Superior cerebellar arteries are normal. 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 ANTERIOR CIRCULATION: Atherosclerotic calcification of the internal carotid arteries at the skull base without hemodynamically significant stenosis. Anterior cerebral arteries are normal. Middle cerebral arteries are normal. Venous sinuses: As permitted by contrast timing, patent. Anatomic variants: None Review of the MIP images confirms the above findings. IMPRESSION: 1. No emergent large vessel occlusion. 2. 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 3. Mild bilateral carotid bifurcation atherosclerosis without hemodynamically significant stenosis. Aortic atherosclerosis (ICD10-I70.0). Electronically Signed   By: Deatra Robinson M.D.   On: 10/07/2023 23:21   MR BRAIN WO CONTRAST Result Date: 10/07/2023 CLINICAL DATA:  Transient  ischemic attack EXAM: MRI HEAD WITHOUT CONTRAST TECHNIQUE: Multiplanar, multiecho pulse sequences of the brain and surrounding structures were obtained without intravenous contrast. COMPARISON:  Head CT earlier same day FINDINGS: Brain: Diffusion imaging shows an acute small vessel infarction in the left posteromedial cerebellum. No other acute insult. Old small vessel ischemic changes are present throughout the pons. Numerous old small vessel cerebellar infarctions. Old right inferior occipital cortical and subcortical infarction. Chronic small-vessel ischemic changes throughout the cerebral hemispheric white matter. Dilated perivascular spaces in basal ganglia regions. No large vessel territory stroke. No mass, hydrocephalus or extra-axial collection. Old hemosiderin deposition in the right parietal region relating to a subcortical infarction. Vascular: Major vessels at the base of the brain show flow. Skull and upper cervical spine: Negative Sinuses/Orbits: Clear/normal Other: None IMPRESSION: 1. Acute small vessel infarction in the left posteromedial cerebellum. No swelling or hemorrhage. 2. Old small vessel ischemic changes throughout the pons, cerebellum, right occipital lobe and cerebral hemispheric white matter. Old hemosiderin deposition in the right parietal region relating to a subcortical infarction. Electronically Signed   By: Paulina Fusi M.D.   On: 10/07/2023 20:05   CT Head Wo Contrast Result Date: 10/07/2023 CLINICAL DATA:  Mental status change of unknown cause EXAM: CT HEAD WITHOUT CONTRAST TECHNIQUE: Contiguous axial images were obtained from the base of the skull through the vertex without intravenous contrast. RADIATION DOSE REDUCTION: This exam  was performed according to the departmental dose-optimization program which includes automated exposure control, adjustment of the mA and/or kV according to patient size and/or use of iterative reconstruction technique. COMPARISON:  12/30/2021  FINDINGS: Brain: Generalized atrophy without subjective lobar predominance. Extensive chronic small-vessel ischemic changes of pons. Old small vessel infarctions of the cerebellum. Old ischemic changes of the right thalamus. Chronic small-vessel ischemic changes throughout the cerebral hemispheric white matter. No sign of acute infarction, mass lesion, hemorrhage, hydrocephalus or extra-axial collection. Vascular: There is atherosclerotic calcification of the major vessels at the base of the brain. Skull: Negative Sinuses/Orbits: Clear/normal Other: None IMPRESSION: No acute CT finding. Atrophy and chronic small-vessel ischemic changes as outlined above. Old small vessel infarctions of the cerebellum, right thalamus and pons. Electronically Signed   By: Paulina Fusi M.D.   On: 10/07/2023 19:44   DG Chest 2 View Result Date: 10/07/2023 CLINICAL DATA:  Cough. EXAM: CHEST - 2 VIEW COMPARISON:  03/12/2023. FINDINGS: Bilateral lung fields are clear. Bilateral costophrenic angles are clear. Elevated right hemidiaphragm again seen. Stable cardio-mediastinal silhouette. No acute osseous abnormalities. Old healed posterior right sixth rib fracture noted. The soft tissues are within normal limits. IMPRESSION: No active cardiopulmonary disease. Electronically Signed   By: Jules Schick M.D.   On: 10/07/2023 16:31     PHYSICAL EXAM  Temp:  [97.8 F (36.6 C)-98.3 F (36.8 C)] 98.2 F (36.8 C) (02/18 1256) Pulse Rate:  [62-76] 74 (02/18 1256) Resp:  [16-25] 16 (02/18 1256) BP: (98-136)/(54-76) 129/67 (02/18 1256) SpO2:  [91 %-97 %] 91 % (02/18 1256) Weight:  [60.8 kg] 60.8 kg (02/17 1520)  General - Well nourished, well developed, in no apparent distress.  Ophthalmologic - fundi not visualized due to noncooperation.  Cardiovascular - Regular rhythm and rate.  Mental Status -  Level of arousal and orientation to time, place, and person were intact. Language including expression, naming, repetition,  comprehension was assessed and found intact. Fund of Knowledge was assessed and was intact.  Cranial Nerves II - XII - II - Visual field intact OU, decreased bilateral visual acuity which is chronic. III, IV, VI - Extraocular movements intact. V - Facial sensation intact bilaterally. VII - Facial movement intact bilaterally, slight left nasolabial fold flattening which is chronic per daughter. VIII - Hearing & vestibular intact bilaterally. X - Palate elevates symmetrically. XI - Chin turning & shoulder shrug intact bilaterally. XII - Tongue protrusion intact.  Motor Strength - The patient's strength was normal in all extremities and pronator drift was absent.  Bulk was normal and fasciculations were absent.   Motor Tone - Muscle tone was assessed at the neck and appendages and was normal.  Reflexes - The patient's reflexes were symmetrical in all extremities and he had no pathological reflexes.  Sensory - Light touch, temperature/pinprick were assessed and were symmetrical.    Coordination - The patient had normal movements in the hands with no ataxia or dysmetria.  Tremor was absent.  Gait and Station - deferred.   ASSESSMENT/PLAN [name] is a 89 y.o. male with history of HLD, hypertension, CAD, stroke admitted for difficulty walking.  Had drastic choking on food and coughing all day long the night before symptoms.  No TNK given due to outside window.    Stroke:  left small linear cerebellar infarct, likely secondary to large vessel disease source from left VA stenosis vs. Small VA branch dissection with prolonged drastic coughing CT no acute finding. CT head and neck  right P1 occlusion, left V4 atherosclerosis with mild to moderate stenosis MRI left small linear cerebellar infarct 2D Echo pending LE venous Doppler no DVT LDL 45 HgbA1c 5.6 Heparin subcu for VTE prophylaxis Plavix 75 daily prior to admission, now on aspirin 81 mg daily and clopidogrel 75 mg daily  DAPT for 3 months and then applied alone given left VA stenosis. Patient counseled to be compliant with his antithrombotic medications Ongoing aggressive stroke risk factor management Therapy recommendations: Home health PT Disposition: Pending  History of stroke 09/2021 admitted for right-sided weakness, slurred speech and difficulty with gait.  MRI showed left CC genu and left pontine infarct.  Old right parietal hemorrhagic infarct.  CT head and neck right P1 65 to 70% stenosis.  LDL 64, A1c 5.8.  Discharged on DAPT and Zocor 40. 12/2021 admitted for left CR small infarct.  CT head and neck right PCA occlusion, left VA and bilateral ICA siphon and bulb atherosclerosis.  LDL 97, A1c 5.6.  30-day cardiac event monitor negative for A-fib.  He started on Crestor 20 and aspirin and Brilinta.  Hypertension Stable Avoid low BP Long term BP goal normotensive  Hyperlipidemia Home meds: Crestor 20 LDL 45, goal < 70 Now on Crestor 20 Continue statin at discharge  Other Stroke Risk Factors Advanced age CAD  Other Active Problems Mild leukocytosis, WBC 14.5--11.3  Hospital day # 1  Neurology will sign off. Please call with questions. Pt will follow up with stroke clinic NP at [facility] in about 4 weeks. Thanks for the consult.   Marvel Plan, MD PhD Stroke Neurology 10/08/2023 3:00 PM    To contact Stroke Continuity provider, please refer to [email or web address]. After hours, contact General Neurology

## 2023-10-08 NOTE — Evaluation (Signed)
 Occupational Therapy Evaluation Patient Details Name: Clarence Myers MRN: [id number] DOB: 09-Sep-1933 Today's Date: 10/08/2023   History of Present Illness   89 y.o. male who presents with acute disequilibrium and was found to have a small ischemic cerebellar stroke. PMH significant for essential hypertension, hyperlipidemia, history of CAD angioplasty 1992, CKD stage II, CVA x 2, chronic aortic enlargement, aortic stenosis-asymptomatic, GERD and vitamin D deficiency.     Clinical Impressions Pt currently needs min to mod assist for functional transfers, bed mobility, and LB selfcare sit to stand with use of the RW for support.  Pt lives with his spouse and prior to admission they have had 24 hour hired caregivers that provide assist for ADLs and homemanagement.  Clarence Myers used a RW for support during mobility with integration of a shower bench for bathing and BSC over the regular toilet with toileting.  Feel he will benefit from acute care OT at this time to help increase current dependency level with selfcare tasks and transfers to a level that is safer and more efficient for discharge home.  Recommend 24 hour assist continue at discharge with HHOT to progress independence.        If plan is discharge home, recommend the following:   A little help with walking and/or transfers;A little help with bathing/dressing/bathroom;Assistance with cooking/housework;Assist for transportation;Direct supervision/assist for financial management;Direct supervision/assist for medications management;Supervision due to cognitive status;Help with stairs or ramp for entrance     Functional Status Assessment   Patient has had a recent decline in their functional status and demonstrates the ability to make significant improvements in function in a reasonable and predictable amount of time.     Equipment Recommendations   None recommended by OT      Precautions/Restrictions    Precautions Precautions: Fall Restrictions Weight Bearing Restrictions Per Provider Order: No     Mobility Bed Mobility Overal bed mobility: Needs Assistance Bed Mobility: Supine to Sit     Supine to sit: Mod assist     General bed mobility comments: Mod assist for bringing trunk up to sitting and scooting LEs and hips to the edge of the bed.    Transfers Overall transfer level: Needs assistance Equipment used: Rolling walker (2 wheels) Transfers: Sit to/from Stand, Bed to chair/wheelchair/BSC Sit to Stand: Min assist     Step pivot transfers: Min assist     General transfer comment: Increased time for sit to stand for for step pivot transfer to the recliner from EOB.      Balance Overall balance assessment: Needs assistance Sitting-balance support: Feet supported Sitting balance-Clarence Myers Scale: Fair Sitting balance - Comments: Pt able to maintain static sitting balance with supervision.   Standing balance support: Reliant on assistive device for balance Standing balance-Clarence Myers Scale: Poor Standing balance comment: Pt needed min assist and use of the RW for standing and mobility.                           ADL either performed or assessed with clinical judgement   ADL Overall ADL's : Needs assistance/impaired Eating/Feeding: Set up;Sitting   Grooming: Wash/dry face;Wash/dry hands;Sitting;Set up   Upper Body Bathing: Supervision/ safety;Sitting   Lower Body Bathing: Minimal assistance;Sit to/from stand   Upper Body Dressing : Minimal assistance;Sitting   Lower Body Dressing: Moderate assistance;Sit to/from stand   Toilet Transfer: Minimal assistance;Stand-pivot;Rolling walker (2 wheels);BSC/3in1   Toileting- Clothing Manipulation and Hygiene: Minimal assistance;Sit to/from stand  Functional mobility during ADLs: Minimal assistance (ambulation with RW) General ADL Comments: Pt with increased trunk and cervical flexion in standing and during  mobility.  Tends to push walker too far out in front leading to more of a flexed posture.  Decreased efficiency with reaching either LE for dressing tasks.     Vision Baseline Vision/History: 1 Wears glasses;3 Glaucoma Ability to See in Adequate Light: 1 Impaired Patient Visual Report: No change from baseline Vision Assessment?: No apparent visual deficits Additional Comments: Pt reports no acute visual changes but wears glasses at all times.     Perception Perception: Within Functional Limits       Praxis Praxis: WFL       Pertinent Vitals/Pain Pain Assessment Pain Assessment: No/denies pain     Extremity/Trunk Assessment Upper Extremity Assessment Upper Extremity Assessment: RUE deficits/detail RUE Deficits / Details: Hx of mild paresis secondary to previous CVA.  AROM shoulder flexion 0-110 degrees overall,  full AROM elbow flexion and extension as well as gross grasp 4/5. RUE Sensation: WNL RUE Coordination: decreased gross motor;decreased fine motor   Lower Extremity Assessment Lower Extremity Assessment: Defer to PT evaluation   Cervical / Trunk Assessment Cervical / Trunk Assessment: Kyphotic   Communication Communication Communication: No apparent difficulties   Cognition Arousal: Alert Behavior During Therapy: WFL for tasks assessed/performed Cognition: Cognition impaired   Orientation impairments: Time Awareness: Intellectual awareness intact, Online awareness impaired Memory impairment (select all impairments): Short-term memory Attention impairment (select first level of impairment): Sustained attention Executive functioning impairment (select all impairments): Problem solving, Sequencing OT - Cognition Comments: Pt slower with providing PLOF information and at times needing daughter for accuracy.  Able to follow one step commands consistently with increased time, but needed min to mod demonstrational cueing to sequence use of RW (staying closer, standing  tall).                 Following commands: Intact       Cueing  General Comments   Cueing Techniques: Verbal cues;Gestural cues              Home Living Family/patient expects to be discharged to:: Private residence Living Arrangements: Spouse/significant other Available Help at Discharge: Family (24/7 personal care attendant) Type of Home: House Home Access: Stairs to enter [employer] of Steps: 2   Home Layout: Able to live on main level with bedroom/bathroom     Bathroom Shower/Tub: Tub/shower unit   Bathroom Toilet: Handicapped height     Home Equipment: BSC/3in1;Tub bench;Rolling Environmental consultant (2 wheels);Cane - single point;Grab bars - tub/shower;Hospital bed          Prior Functioning/Environment Prior Level of Function : Independent/Modified Independent             Mobility Comments: Ambulates with RW with some assist for getting in and out of the bed ADLs Comments: Pt reports using the RW for ambulation and he needed a little bit up assist for bathing and dressing.    OT Problem List: Impaired balance (sitting and/or standing);Decreased knowledge of use of DME or AE;Decreased strength;Decreased coordination;Decreased safety awareness;Decreased cognition   OT Treatment/Interventions: Self-care/ADL training;Balance training;Therapeutic activities;DME and/or AE instruction;Cognitive remediation/compensation;Neuromuscular education;Therapeutic exercise;Patient/family education      OT Goals(Current goals can be found in the care plan section)   Acute Rehab OT Goals Patient Stated Goal: Pt did not state but agreeable to participate in OT session. OT Goal Formulation: With patient Time For Goal Achievement: 10/08/23 Potential to Achieve Goals: Good  OT Frequency:  Min 1X/week       AM-PAC OT "6 Clicks" Daily Activity     Outcome Measure Help from another person eating meals?: A Little Help from another person taking care of personal  grooming?: A Little Help from another person toileting, which includes using toliet, bedpan, or urinal?: A Little Help from another person bathing (including washing, rinsing, drying)?: A Little Help from another person to put on and taking off regular upper body clothing?: A Little Help from another person to put on and taking off regular lower body clothing?: A Lot 6 Click Score: 17   End of Session Equipment Utilized During Treatment: Gait belt;Rolling walker (2 wheels) Nurse Communication: Mobility status  Activity Tolerance: Patient tolerated treatment well Patient left: in chair;with call bell/phone within reach;with chair alarm set (alarm pad in chair but no box available, nursing made aware)  OT Visit Diagnosis: Unsteadiness on feet (R26.81);Other abnormalities of gait and mobility (R26.89);Muscle weakness (generalized) (M62.81);Hemiplegia and hemiparesis Hemiplegia - Right/Left: Right Hemiplegia - dominant/non-dominant: Dominant Hemiplegia - caused by: Cerebral infarction                Time: 1133-1220 OT Time Calculation (min): 47 min Charges:  OT General Charges $OT Visit: 1 Visit OT Evaluation $OT Eval Moderate Complexity: 1 Mod OT Treatments $Self Care/Home Management : 23-37 mins  Perrin Maltese, OTR/L Acute Rehabilitation Services  Office [phone] 10/08/2023

## 2023-10-08 NOTE — Evaluation (Signed)
 Physical Therapy Evaluation Patient Details Name: Clarence Myers MRN: [id number] DOB: [birth year] Today's Date: 10/08/2023  History of Present Illness  89 y.o. male who presents with acute disequilibrium on 10/07/23 and was found to have a small ischemic cerebellar stroke. PMH significant for essential hypertension, hyperlipidemia, history of CAD angioplasty 1992, CKD stage II, CVA x 2, chronic aortic enlargement, aortic stenosis-asymptomatic, GERD and vitamin D deficiency.  Clinical Impression  Pt admitted with above diagnosis. At baseline, pt resides with wife and has 24 hr care from family or PCA.  He is a household ambulator with RW.  He has had prior CVA with some mild residual R deficits.  Today, pt presents with slight decrease in strength on R side, L facial droop (dtr reports baseline), decreased balance, mobility, and coordination.  He required min A for transfers and to ambulate 76' with RW.  Does need cues for safety.  Some mild cognitive deficits but daughter reports basically cognition seems to be baseline. Pt currently with functional limitations due to the deficits listed below (see PT Problem List). Pt will benefit from acute skilled PT to increase their independence and safety with mobility to allow discharge.  Do recommend HHPT at d/c as pt has 24 hr care.          If plan is discharge home, recommend the following: A little help with walking and/or transfers;A little help with bathing/dressing/bathroom;Assistance with cooking/housework;Help with stairs or ramp for entrance   Can travel by private vehicle        Equipment Recommendations None recommended by PT  Recommendations for Other Services       Functional Status Assessment Patient has had a recent decline in their functional status and demonstrates the ability to make significant improvements in function in a reasonable and predictable amount of time.     Precautions / Restrictions Precautions Precautions: Fall       Mobility  Bed Mobility               General bed mobility comments: in chair at arrival; was mod A with OT earlier    Transfers Overall transfer level: Needs assistance Equipment used: Rolling walker (2 wheels) Transfers: Sit to/from Stand Sit to Stand: Contact guard assist           General transfer comment: increased time; no physical assist but slow to rise with tendency to posterior lean and pushing calves of legs into chair    Ambulation/Gait Ambulation/Gait assistance: Min assist Gait Distance (Feet): 60 Feet Assistive device: Rolling walker (2 wheels) Gait Pattern/deviations: Step-to pattern, Decreased stride length, Shuffle, Trunk flexed Gait velocity: decreased     General Gait Details: Pt tends to lean over and let RW get too far forward - able to correct but needed at least 3 cues during walk; overall CGA but did need occasional min A for RW around obstacles.  Daughter reports visual deficits at baseline. Daughter does reports seems more labored/slow than baseline  Stairs            Wheelchair Mobility     Tilt Bed    Modified Rankin (Stroke Patients Only) Modified Rankin (Stroke Patients Only) Pre-Morbid Rankin Score: Moderate disability Modified Rankin: Moderately severe disability     Balance Overall balance assessment: Needs assistance Sitting-balance support: Feet supported Sitting balance-Leahy Scale: Good     Standing balance support: Reliant on assistive device for balance Standing balance-Leahy Scale: Poor Standing balance comment: RW and CGA/min A  Pertinent Vitals/Pain Pain Assessment Pain Assessment: No/denies pain    Home Living Family/patient expects to be discharged to:: Private residence Living Arrangements: Spouse/significant other Available Help at Discharge: Family;Personal care attendant (has 24 hr care) Type of Home: House Home Access: Stairs to enter   [employer] of Steps: 2   Home Layout: Able to live on main level with bedroom/bathroom Home Equipment: BSC/3in1;Tub bench;Rolling Walker (2 wheels);Cane - single point;Grab bars - tub/shower;Hospital bed;Wheelchair - manual      Prior Function Prior Level of Function : Independent/Modified Independent             Mobility Comments: Ambulates with RW (mostly just household ambulator) with some assist for getting in and out of the bed ADLs Comments: Pt reports using the RW for ambulation and he needed a little bit up assist for bathing and dressing.     Extremity/Trunk Assessment   Upper Extremity Assessment Upper Extremity Assessment: Defer to OT evaluation RUE Deficits / Details: Hx of mild paresis secondary to previous CVA.  AROM shoulder flexion 0-110 degrees overall,  full AROM elbow flexion and extension as well as gross grasp 4/5. RUE Sensation: WNL RUE Coordination: decreased gross motor;decreased fine motor    Lower Extremity Assessment Lower Extremity Assessment: LLE deficits/detail;RLE deficits/detail RLE Deficits / Details: Reports prior CVA affected R side.  ROM: WFL, slightly less DF compared to L; MMT: ankle 4+/5, knee ext 5/5, hip flex 4+/5 RLE Coordination: decreased gross motor (decreased with toe taps and heel/shin) LLE Deficits / Details: ROM WFL; MMT 5/5    Cervical / Trunk Assessment Cervical / Trunk Assessment: Kyphotic  Communication   Communication Communication: No apparent difficulties    Cognition Arousal: Alert Behavior During Therapy: WFL for tasks assessed/performed   PT - Cognitive impairments: History of cognitive impairments                       PT - Cognition Comments: Pt alert and oriented x 3; He needed increased time to say months of year in reverse order with 1 error, only able to name 2/3 animals that begin with C, daughter reports seems about baseline Following commands: Intact       Cueing Cueing Techniques:  Verbal cues, Gestural cues     General Comments General comments (skin integrity, edema, etc.): VSS    Exercises     Assessment/Plan    PT Assessment Patient needs continued PT services  PT Problem List Decreased strength;Pain;Decreased range of motion;Decreased activity tolerance;Decreased balance;Decreased mobility;Decreased knowledge of use of DME;Decreased cognition;Decreased safety awareness       PT Treatment Interventions DME instruction;Therapeutic exercise;Gait training;Stair training;Functional mobility training;Therapeutic activities;Patient/family education;Neuromuscular re-education;Modalities;Balance training    PT Goals (Current goals can be found in the Care Plan section)  Acute Rehab PT Goals Patient Stated Goal: return home PT Goal Formulation: With patient/family Time For Goal Achievement: 10/22/23 Potential to Achieve Goals: Good    Frequency Min 1X/week     Co-evaluation               AM-PAC PT "6 Clicks" Mobility  Outcome Measure Help needed turning from your back to your side while in a flat bed without using bedrails?: A Little Help needed moving from lying on your back to sitting on the side of a flat bed without using bedrails?: A Lot Help needed moving to and from a bed to a chair (including a wheelchair)?: A Little Help needed standing up from a chair  using your arms (e.g., wheelchair or bedside chair)?: A Little Help needed to walk in hospital room?: A Little Help needed climbing 3-5 steps with a railing? : A Little 6 Click Score: 17    End of Session Equipment Utilized During Treatment: Gait belt Activity Tolerance: Patient tolerated treatment well Patient left: in chair;with call bell/phone within reach;with family/visitor present (no alarm box in room - dtr reports she will be with him) Nurse Communication: Mobility status PT Visit Diagnosis: Other abnormalities of gait and mobility (R26.89);Muscle weakness (generalized) (M62.81)     Time: 1610-9604 PT Time Calculation (min) (ACUTE ONLY): 29 min   Charges:   PT Evaluation $PT Eval Low Complexity: 1 Low PT Treatments $Gait Training: 8-22 mins PT General Charges $$ ACUTE PT VISIT: 1 Visit         Anise Salvo, PT Acute Rehab [facility] [phone]   Rayetta Humphrey 10/08/2023, 1:55 PM

## 2023-10-08 NOTE — TOC Transition Note (Signed)
 Transition of Care [facility]) - Discharge Note   Patient Details  Name: Clarence Myers MRN: [id number] Date of Birth: 05/21/1934  [facility] CM/SW Contact:  Kermit Balo, RN Phone Number: 10/08/2023, 3:26 PM   Clinical Narrative:     Pt is from home with his spouse/ caregivers. Pt and spouse have someone with them all the time. They either have caregivers or one of the daughters is with them.  DME: hospital bed/ walker/ handicap bathroom Daughters manage their medications and provide needed transportation.  Home health arranged with [facility] as pt's spouse is already active with them at home. Information on the AVS. [facility] will contact them for the first home visit. Family will transport home.  Final next level of care: Home w Home Health Services Barriers to Discharge: No Barriers Identified   Patient Goals and CMS Choice   CMS Medicare.gov Compare Post Acute Care list provided to:: Patient Represenative (must comment) Choice offered to / list presented to : Adult Children      Discharge Placement                       Discharge Plan and Services Additional resources added to the After Visit Summary for                            [facility] Arranged: PT, OT [facility] Agency: [facility] Date [facility] Agency Contacted: 10/08/23   Representative spoke with at [facility] Agency: Amy  Social Drivers of Health (SDOH) Interventions SDOH Screenings   Food Insecurity: No Food Insecurity (10/08/2023)  Housing: Low Risk  (10/08/2023)  Transportation Needs: No Transportation Needs (10/08/2023)  Utilities: Not At Risk (10/08/2023)  Social Connections: Moderately Isolated (10/08/2023)  Tobacco Use: Low Risk  (10/07/2023)     Readmission Risk Interventions     No data to display

## 2023-10-08 NOTE — Discharge Summary (Signed)
 [facility]  Physician Discharge Summary   Patient ID: Clarence Myers MRN: [id number] DOB/AGE: 1934/02/13 89 y.o.  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 male with medical history significant for essential hypertension, hyperlipidemia, history of CAD angioplasty 1992, CKD stage II, CVA x 2, chronic aortic enlargement, aortic stenosis-asymptomatic, GERD and vitamin D deficiency presented to emergency department with concern for 1 episode of choking as well as generalized weakness and some confusion.  Evaluation in the ED raise concern for acute stroke.  Patient was hospitalized for further management.     Consultants: Neurology   Procedures: Echocardiogram  HOSPITAL COURSE:   Acute ischemic stroke Patient has prior history of stroke as well. He had a choking episode 2 days prior to admission.  Seems to be stable from a neurological standpoint.  Has subtle right-sided weakness. Seen by neurology. Cleared for discharge on aspirin and plavix. ECHO and LE doppler were done. Noted to be on statin as well. LDL is 45.  HbA1c 5.6. HH PT OT.   Acute kidney injury on chronic kidney disease  stage II Renal function improved this morning.   Normocytic anemia Drop in hemoglobin is likely dilutional.  No evidence for overt bleeding.     Leukocytosis Most likely reactive.  Improved this morning.   History of coronary artery disease/aortic stenosis He is status post angioplasty in 1992.  Followed by Dr. [place]. Examination does reveal systolic murmur.  Echocardiogram from June 2023 does suggest moderate to severe aortic stenosis.  According to cardiology notes from September 2024 this was asymptomatic at that time and no further evaluation was pursued due to limited functional capacity and lack of symptoms.  Ome progression on current echo. WIll send message to cardiology to arrange OP appt.   Essential hypertension/chronic diastolic CHF Stable.     Hyperlipidemia Continue Crestor.   Generalized anxiety disorder Continue sertraline.  Ok for discharge.    PERTINENT LABS:  The results of significant diagnostics from this hospitalization (including imaging, microbiology, ancillary and laboratory) are listed below for reference.    Microbiology: Recent Results (from the past 240 hours)  Resp panel by RT-PCR (RSV, Flu A&B, Covid) Anterior Nasal Swab     Status: None   Collection Time: 10/08/23 12:23 AM   Specimen: Anterior Nasal Swab  Result Value Ref Range Status   SARS Coronavirus 2 by RT PCR NEGATIVE NEGATIVE Final   Influenza A by PCR NEGATIVE NEGATIVE Final   Influenza B by PCR NEGATIVE NEGATIVE Final    Comment: (NOTE) The Xpert Xpress SARS-CoV-2/FLU/RSV plus assay is intended as an aid in the diagnosis of influenza from Nasopharyngeal swab specimens and should not be used as a sole basis for treatment. Nasal washings and  aspirates are unacceptable for Xpert Xpress SARS-CoV-2/FLU/RSV testing.  Fact Sheet for Patients: BloggerCourse.com  Fact Sheet for Healthcare Providers: SeriousBroker.it  This test is not  yet approved or cleared by the [place] FDA and has been authorized for detection and/or diagnosis of SARS-CoV-2 by FDA under an Emergency Use Authorization (EUA). This EUA will remain in effect (meaning this test can be used) for the duration of the COVID-19 declaration under Section 564(b)(1) of the Act, 21 U.S.C. section 360bbb-3(b)(1), unless the authorization is terminated or revoked.     Resp Syncytial Virus by PCR NEGATIVE NEGATIVE Final    Comment: (NOTE) Fact Sheet for Patients: BloggerCourse.com  Fact Sheet for Healthcare Providers: SeriousBroker.it  This test is not yet approved or cleared by the [place] FDA and has been authorized for detection and/or diagnosis of SARS-CoV-2 by FDA under an Emergency Use Authorization (EUA). This EUA will remain in effect (meaning this test can be used) for the duration of the COVID-19 declaration under Section 564(b)(1) of the Act, 21 U.S.C. section 360bbb-3(b)(1), unless the authorization is terminated or revoked.  Performed at [facility], [address], [place], [place] [address]      Labs:   Basic Metabolic Panel: Recent Labs  Lab 10/07/23 1534 10/08/23 0105  NA 140 138  K 3.6 3.6  CL 106 104  CO2 23 22  GLUCOSE 105* 106*  BUN 38* 34*  CREATININE 1.30* 1.06  CALCIUM 9.0 8.6*  MG  --  1.9   Liver Function Tests: Recent Labs  Lab 10/07/23 1534 10/08/23 0105  AST 20 16  ALT 14 14  ALKPHOS 57 47  BILITOT 0.9 0.9  PROT 7.5 6.5  ALBUMIN 3.4* 3.0*    CBC: Recent Labs  Lab 10/07/23 1534 10/08/23 0430  WBC 14.5* 11.3*  HGB 12.7* 10.8*  HCT 38.5* 33.4*  MCV 94.4 95.4  PLT 242 211      IMAGING STUDIES ECHOCARDIOGRAM COMPLETE Result Date: 10/08/2023    ECHOCARDIOGRAM REPORT   Patient Name:   Clarence Myers Date of Exam: 10/08/2023 Medical Rec #:  [id number]         Height:       66.0 in Accession #:    [id number]        Weight:        134.0 lb Date of Birth:  03-29-1934         BSA:          1.687 m Patient Age:    89 years          BP:           102/40 mmHg Patient Gender: M                 HR:           70 bpm. Exam Location:  Inpatient Procedure: 2D Echo (Both Spectral and Color Flow Doppler were utilized during            procedure). Indications:    stroke  History:        Patient has prior history of Echocardiogram examinations, most                 recent 02/09/2022. CHF, CAD, chronic kidney disease; Risk                 Factors:Hypertension and Dyslipidemia.  Sonographer:    Delcie Roch RDCS Referring Phys: [id number] SUBRINA SUNDIL  Sonographer Comments: Suboptimal subcostal window.  IMPRESSIONS  1. Left ventricular ejection fraction, by estimation, is 60 to 65%. The left ventricle has normal function. The left ventricle has no regional wall motion abnormalities. There is mild concentric left ventricular hypertrophy. Left ventricular diastolic parameters are consistent with Grade I diastolic dysfunction (impaired relaxation).  2. Right ventricular systolic function is normal. The right ventricular size is normal.  3. Left atrial size was mildly dilated.  4. The mitral valve is normal in structure. Trivial mitral valve regurgitation. No evidence of mitral stenosis.  5. At least moderate stenosis: mean gradient 17 mm Hg, and normal stroke volume index, though valve appears significantly calcified and with decreased excursion with DVI 0.30. The aortic valve is calcified. Aortic valve regurgitation is trivial. Moderate aortic valve stenosis. Comparison(s): Prior images reviewed side by side. Progression of aortic valve stenosis, both visually and by gradient. FINDINGS  Left Ventricle: Left ventricular ejection fraction, by estimation, is 60 to 65%. The left ventricle has normal function. The left ventricle has no regional wall motion abnormalities. Strain imaging was not performed. The left ventricular internal cavity  size was normal in  size. There is mild concentric left ventricular hypertrophy. Left ventricular diastolic parameters are consistent with Grade I diastolic dysfunction (impaired relaxation). Right Ventricle: The right ventricular size is normal. No increase in right ventricular wall thickness. Right ventricular systolic function is normal. Left Atrium: Left atrial size was mildly dilated. Right Atrium: Right atrial size was normal in size. Pericardium: There is no evidence of pericardial effusion. Mitral Valve: The mitral valve is normal in structure. Trivial mitral valve regurgitation. No evidence of mitral valve stenosis. Tricuspid Valve: The tricuspid valve is normal in structure. Tricuspid valve regurgitation is trivial. No evidence of tricuspid stenosis. Aortic Valve: At least moderate stenosis: mean gradient 17 mm Hg, and normal stroke volume index, though valve appears significantly calcified and with decreased excursion with DVI 0.30. The aortic valve is calcified. Aortic valve regurgitation is trivial. Moderate aortic stenosis is present. Aortic valve mean gradient measures 17.0 mmHg. Aortic valve peak gradient measures 27.2 mmHg. Aortic valve area, by VTI measures 1.06 cm. Pulmonic Valve: The pulmonic valve was normal in structure. Pulmonic valve regurgitation is not visualized. No evidence of pulmonic stenosis. Aorta: The aortic root, ascending aorta and aortic arch are all structurally normal, with no evidence of dilitation or obstruction. IAS/Shunts: The atrial septum is grossly normal. Additional Comments: 3D imaging was not performed.  LEFT VENTRICLE PLAX 2D LVIDd:         4.70 cm   Diastology LVIDs:         3.10 cm   LV e' medial:    6.42 cm/s LV PW:         1.10 cm   LV E/e' medial:  11.4 LV IVS:        1.10 cm   LV e' lateral:   6.85 cm/s LVOT diam:     2.20 cm   LV E/e' lateral: 10.7 LV SV:         65 LV SV Index:   39 LVOT Area:     3.80 cm  RIGHT VENTRICLE RV Basal diam:  2.70 cm RV S prime:     10.30 cm/s TAPSE  (M-mode): 1.6 cm LEFT ATRIUM           Index        RIGHT ATRIUM           Index LA diam:      3.60 cm 2.13  cm/m   RA Area:     12.00 cm LA Vol (A2C): 59.7 ml 35.39 ml/m  RA Volume:   24.30 ml  14.41 ml/m  AORTIC VALVE AV Area (Vmax):    1.15 cm AV Area (Vmean):   0.99 cm AV Area (VTI):     1.06 cm AV Vmax:           261.00 cm/s AV Vmean:          194.000 cm/s AV VTI:            0.611 m AV Peak Grad:      27.2 mmHg AV Mean Grad:      17.0 mmHg LVOT Vmax:         78.80 cm/s LVOT Vmean:        50.350 cm/s LVOT VTI:          0.171 m LVOT/AV VTI ratio: 0.28  AORTA Ao Root diam: 2.90 cm Ao Asc diam:  3.20 cm MITRAL VALVE MV Area (PHT): 3.12 cm    SHUNTS MV Decel Time: 243 msec    Systemic VTI:  0.17 m MV E velocity: 73.10 cm/s  Systemic Diam: 2.20 cm MV A velocity: 86.60 cm/s MV E/A ratio:  0.84 Riley Lam MD Electronically signed by Riley Lam MD Signature Date/Time: 10/08/2023/6:01:44 PM    Final    VAS [place] LOWER EXTREMITY VENOUS (DVT) Result Date: 10/08/2023  Lower Venous DVT Study Patient Name:  JHASE CREPPEL  Date of Exam:   10/08/2023 Medical Rec #: [id number]          Accession #:    [id number] Date of Birth: April 09, 1934          Patient Gender: M Patient Age:   39 years Exam Location:  [facility] Procedure:      VAS [place] LOWER EXTREMITY VENOUS (DVT) Referring Phys: Scheryl Marten XU --------------------------------------------------------------------------------  Indications: Stroke.  Risk Factors: None identified. Comparison Study: None. Performing Technologist: Shona Simpson  Examination Guidelines: A complete evaluation includes B-mode imaging, spectral Doppler, color Doppler, and power Doppler as needed of all accessible portions of each vessel. Bilateral testing is considered an integral part of a complete examination. Limited examinations for reoccurring indications may be performed as noted. The reflux portion of the exam is performed with the patient in reverse Trendelenburg.   +---------+---------------+---------+-----------+----------+--------------+ RIGHT    CompressibilityPhasicitySpontaneityPropertiesThrombus Aging +---------+---------------+---------+-----------+----------+--------------+ CFV      Full           Yes      Yes                                 +---------+---------------+---------+-----------+----------+--------------+ SFJ      Full                                                        +---------+---------------+---------+-----------+----------+--------------+ FV Prox  Full                                                        +---------+---------------+---------+-----------+----------+--------------+ FV Mid   Full                                                        +---------+---------------+---------+-----------+----------+--------------+  FV DistalFull                                                        +---------+---------------+---------+-----------+----------+--------------+ PFV      Full                                                        +---------+---------------+---------+-----------+----------+--------------+ POP      Full           Yes      Yes                                 +---------+---------------+---------+-----------+----------+--------------+ PTV      Full                                                        +---------+---------------+---------+-----------+----------+--------------+ PERO     Full                                                        +---------+---------------+---------+-----------+----------+--------------+   +---------+---------------+---------+-----------+----------+--------------+ LEFT     CompressibilityPhasicitySpontaneityPropertiesThrombus Aging +---------+---------------+---------+-----------+----------+--------------+ CFV      Full           Yes      Yes                                  +---------+---------------+---------+-----------+----------+--------------+ SFJ      Full                                                        +---------+---------------+---------+-----------+----------+--------------+ FV Prox  Full                                                        +---------+---------------+---------+-----------+----------+--------------+ FV Mid   Full                                                        +---------+---------------+---------+-----------+----------+--------------+ FV DistalFull                                                        +---------+---------------+---------+-----------+----------+--------------+  PFV      Full                                                        +---------+---------------+---------+-----------+----------+--------------+ POP      Full           Yes      Yes                                 +---------+---------------+---------+-----------+----------+--------------+ PTV      Full                                                        +---------+---------------+---------+-----------+----------+--------------+ PERO     Full                                                        +---------+---------------+---------+-----------+----------+--------------+     Summary: BILATERAL: - No evidence of deep vein thrombosis seen in the lower extremities, bilaterally. -No evidence of popliteal cyst, bilaterally.   *See table(s) above for measurements and observations. Electronically signed by Lemar Livings MD on 10/08/2023 at 4:37:35 PM.    Final    CT ANGIO HEAD NECK W WO CM Result Date: 10/07/2023 CLINICAL DATA:  Stroke/TIA EXAM: CT ANGIOGRAPHY HEAD AND NECK WITH AND WITHOUT CONTRAST TECHNIQUE: Multidetector CT imaging of the head and neck was performed using the standard protocol during bolus administration of intravenous contrast. Multiplanar CT image reconstructions and MIPs were obtained to evaluate  the vascular anatomy. Carotid stenosis measurements (when applicable) are obtained utilizing NASCET criteria, using the distal internal carotid diameter as the denominator. RADIATION DOSE REDUCTION: This exam was performed according to the departmental dose-optimization program which includes automated exposure control, adjustment of the mA and/or kV according to patient size and/or use of iterative reconstruction technique. CONTRAST:  75mL OMNIPAQUE IOHEXOL 350 MG/ML SOLN COMPARISON:  12/30/2021 FINDINGS: CTA NECK FINDINGS Skeleton: No acute abnormality or high grade bony spinal canal stenosis. Other neck: Normal pharynx, larynx and major salivary glands. No cervical lymphadenopathy. Unremarkable thyroid gland. Upper chest: No pneumothorax or pleural effusion. No nodules or masses. Aortic arch: There is calcific atherosclerosis of the aortic arch. Conventional 3 vessel aortic branching pattern. RIGHT carotid system: No dissection, occlusion or aneurysm. Mild atherosclerotic calcification at the carotid bifurcation without hemodynamically significant stenosis. LEFT carotid system: No dissection, occlusion or aneurysm. Mild atherosclerotic calcification at the carotid bifurcation without hemodynamically significant stenosis. Vertebral arteries: Left dominant configuration. Mild narrowing of both vertebral artery origins. Otherwise, the vertebral arteries are normal to the skull base. CTA HEAD FINDINGS POSTERIOR CIRCULATION: Calcific atherosclerosis of the left V4 segment with less than 50% stenosis. No proximal occlusion of the anterior or inferior cerebellar arteries. Basilar artery is normal. Superior cerebellar arteries are normal. 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 ANTERIOR CIRCULATION: Atherosclerotic calcification of the internal carotid arteries at the skull base without hemodynamically significant stenosis. Anterior  cerebral arteries are normal. Middle cerebral arteries are normal. Venous  sinuses: As permitted by contrast timing, patent. Anatomic variants: None Review of the MIP images confirms the above findings. IMPRESSION: 1. No emergent large vessel occlusion. 2. 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 3. Mild bilateral carotid bifurcation atherosclerosis without hemodynamically significant stenosis. Aortic atherosclerosis (ICD10-I70.0). Electronically Signed   By: Deatra Robinson M.D.   On: 10/07/2023 23:21   MR BRAIN WO CONTRAST Result Date: 10/07/2023 CLINICAL DATA:  Transient ischemic attack EXAM: MRI HEAD WITHOUT CONTRAST TECHNIQUE: Multiplanar, multiecho pulse sequences of the brain and surrounding structures were obtained without intravenous contrast. COMPARISON:  Head CT earlier same day FINDINGS: Brain: Diffusion imaging shows an acute small vessel infarction in the left posteromedial cerebellum. No other acute insult. Old small vessel ischemic changes are present throughout the pons. Numerous old small vessel cerebellar infarctions. Old right inferior occipital cortical and subcortical infarction. Chronic small-vessel ischemic changes throughout the cerebral hemispheric white matter. Dilated perivascular spaces in basal ganglia regions. No large vessel territory stroke. No mass, hydrocephalus or extra-axial collection. Old hemosiderin deposition in the right parietal region relating to a subcortical infarction. Vascular: Major vessels at the base of the brain show flow. Skull and upper cervical spine: Negative Sinuses/Orbits: Clear/normal Other: None IMPRESSION: 1. Acute small vessel infarction in the left posteromedial cerebellum. No swelling or hemorrhage. 2. Old small vessel ischemic changes throughout the pons, cerebellum, right occipital lobe and cerebral hemispheric white matter. Old hemosiderin deposition in the right parietal region relating to a subcortical infarction. Electronically Signed   By: Paulina Fusi M.D.   On: 10/07/2023 20:05   CT Head Wo  Contrast Result Date: 10/07/2023 CLINICAL DATA:  Mental status change of unknown cause EXAM: CT HEAD WITHOUT CONTRAST TECHNIQUE: Contiguous axial images were obtained from the base of the skull through the vertex without intravenous contrast. RADIATION DOSE REDUCTION: This exam was performed according to the departmental dose-optimization program which includes automated exposure control, adjustment of the mA and/or kV according to patient size and/or use of iterative reconstruction technique. COMPARISON:  12/30/2021 FINDINGS: Brain: Generalized atrophy without subjective lobar predominance. Extensive chronic small-vessel ischemic changes of pons. Old small vessel infarctions of the cerebellum. Old ischemic changes of the right thalamus. Chronic small-vessel ischemic changes throughout the cerebral hemispheric white matter. No sign of acute infarction, mass lesion, hemorrhage, hydrocephalus or extra-axial collection. Vascular: There is atherosclerotic calcification of the major vessels at the base of the brain. Skull: Negative Sinuses/Orbits: Clear/normal Other: None IMPRESSION: No acute CT finding. Atrophy and chronic small-vessel ischemic changes as outlined above. Old small vessel infarctions of the cerebellum, right thalamus and pons. Electronically Signed   By: Paulina Fusi M.D.   On: 10/07/2023 19:44   DG Chest 2 View Result Date: 10/07/2023 CLINICAL DATA:  Cough. EXAM: CHEST - 2 VIEW COMPARISON:  03/12/2023. FINDINGS: Bilateral lung fields are clear. Bilateral costophrenic angles are clear. Elevated right hemidiaphragm again seen. Stable cardio-mediastinal silhouette. No acute osseous abnormalities. Old healed posterior right sixth rib fracture noted. The soft tissues are within normal limits. IMPRESSION: No active cardiopulmonary disease. Electronically Signed   By: Jules Schick M.D.   On: 10/07/2023 16:31    DISCHARGE EXAMINATION: See PN from earlier today.  DISPOSITION: Home  Discharge  Instructions     Ambulatory referral to Neurology   Complete by: As directed    Follow up with stroke clinic NP at [facility] in about 4-6 weeks. Thanks.   Call  MD for:  difficulty breathing, headache or visual disturbances   Complete by: As directed    Call MD for:  extreme fatigue   Complete by: As directed    Call MD for:  persistant dizziness or light-headedness   Complete by: As directed    Call MD for:  persistant nausea and vomiting   Complete by: As directed    Call MD for:  severe uncontrolled pain   Complete by: As directed    Call MD for:  temperature >100.4   Complete by: As directed    Diet - low sodium heart healthy   Complete by: As directed    Discharge instructions   Complete by: As directed    F/u with PCP in 1 week. Take meds as prescribed.  You were cared for by a hospitalist during your hospital stay. If you have any questions about your discharge medications or the care you received while you were in the hospital after you are discharged, you can call the unit and asked to speak with the hospitalist on call if the hospitalist that took care of you is not available. Once you are discharged, your primary care physician will handle any further medical issues. Please note that NO REFILLS for any discharge medications will be authorized once you are discharged, as it is imperative that you return to your primary care physician (or establish a relationship with a primary care physician if you do not have one) for your aftercare needs so that they can reassess your need for medications and monitor your lab values. If you do not have a primary care physician, you can call [phone] for a physician referral.   Increase activity slowly   Complete by: As directed         Allergies as of 10/08/2023       Reactions   Bee Venom Swelling   Blue Crab (cavinectes Sapidus) Allergy Skin Test Hives, Swelling, Cough   Shellfish-derived Products Hives        Medication List      TAKE these medications    acetaminophen 325 MG tablet Commonly known as: TYLENOL Take 2 tablets (650 mg total) by mouth every 4 (four) hours as needed for mild pain (or temp > 37.5 C (99.5 F)).   aspirin EC 81 MG tablet Take 1 tablet (81 mg total) by mouth daily. Swallow whole. Take for only 3 months. Start taking on: October 09, 2023   clopidogrel 75 MG tablet Commonly known as: PLAVIX Take 1 tablet (75 mg total) by mouth daily. RESUME ONLY AFTER COMPLETION OF 30 DAYS OF ASPIRIN AND BRILINTA   FISH OIL PO Take 1,000 mg by mouth in the morning and at bedtime.   metoprolol tartrate 25 MG tablet Commonly known as: LOPRESSOR Take 12.5 mg by mouth 2 (two) times daily.   mirtazapine 15 MG tablet Commonly known as: REMERON Take 15 mg by mouth at bedtime.   MULTI-VITAMIN PO Take by mouth daily.   multivitamin-lutein Caps capsule Take 1 capsule by mouth daily.   omeprazole 20 MG capsule Commonly known as: PRILOSEC Take 20 mg by mouth daily.   PROBIOTIC DAILY PO Take 1 capsule by mouth daily.   Restasis 0.05 % ophthalmic emulsion Generic drug: cycloSPORINE Place 1 drop into both eyes 2 (two) times daily.   rosuvastatin 20 MG tablet Commonly known as: CRESTOR Take 1 tablet (20 mg total) by mouth daily.   sertraline 25 MG tablet Commonly known as: ZOLOFT Take 25  mg by mouth at bedtime.          Follow-up Information     [facility] Follow up.   Why: [facility] will contact you for the first home visit. Contact information: [address] [place] [address] [phone]         Lupita Raider, MD. Schedule an appointment as soon as possible for a visit in 1 week(s).   Specialty: Family Medicine Why: post hospitalization follow up Contact information: [address] [place] [address] [phone]         [facility]. Schedule an appointment as soon as  possible for a visit in 1 month(s).   Specialty: Neurology Why: stroke clinic Contact information: [address] [place] [address] [phone]                TOTAL DISCHARGE TIME: 35 mins  Katleen Carraway Rito Ehrlich  [facility] on [email or web address]  10/08/2023, 6:21 PM

## 2023-10-08 NOTE — Progress Notes (Signed)
 Patient passed swallow screen.  Resumed oral medications and regular diet.  Tereasa Coop, MD [facility] 10/08/2023, 4:20 AM

## 2023-10-08 NOTE — Progress Notes (Signed)
  Echocardiogram 2D Echocardiogram has been performed.  Clarence Myers 10/08/2023, 5:46 PM

## 2023-10-08 NOTE — Progress Notes (Signed)
 Lower extremity venous duplex completed. Please see CV Procedures for preliminary results.  Shona Simpson, RVT 10/08/23 11:38 AM

## 2023-10-08 NOTE — Progress Notes (Signed)
 [facility] PROGRESS NOTE   Clarence Myers:[id number] DOB: 08/10/1934 DOA: 10/07/2023  PCP: Lupita Raider, MD  Brief History: 89 y.o. male with medical history significant for essential hypertension, hyperlipidemia, history of CAD angioplasty 1992, CKD stage II, CVA x 2, chronic aortic enlargement, aortic stenosis-asymptomatic, GERD and vitamin D deficiency presented to emergency department with concern for 1 episode of choking as well as generalized weakness and some confusion.  Evaluation in the ED raise concern for acute stroke.  Patient was hospitalized for further management.    Consultants: Neurology  Procedures: Echocardiogram is pending    Subjective/Interval History: Patient denies any headaches.  His daughter and her husband are at the bedside.  Patient denies any chest pain or shortness of breath.  Wondering when he can go home.    Assessment/Plan:  Acute ischemic stroke Patient has prior history of stroke as well. He had a choking episode 2 days prior to admission.  Seems to be stable from a neurological standpoint.  Has subtle right-sided weakness. Stroke neurology to evaluate. Patient currently on aspirin and Plavix.  Noted to be on statin as well. Echocardiogram is pending. LDL is 45.  HbA1c 5.6. PT OT evaluation is pending.  Apparently passed a swallow screen overnight.  Acute kidney injury on chronic kidney disease stage II Renal function improved this morning.  Normocytic anemia Drop in hemoglobin is likely dilutional.  No evidence for overt bleeding.  Recheck labs tomorrow.  Leukocytosis Most likely reactive.  Improved this morning.  History of coronary artery disease/aortic stenosis He is status post angioplasty in 1992.  Followed by Dr. [place]. Examination does reveal systolic murmur.  Echocardiogram from June 2023 does suggest moderate to severe aortic stenosis.  According to cardiology notes from September 2024 this was asymptomatic  at that time and no further evaluation was pursued due to limited functional capacity and lack of symptoms.  Will follow-up on echocardiogram from this hospitalization as well.  Essential hypertension/chronic diastolic CHF Stable.  Monitor blood pressure closely.  Permissive hypertension to be allowed.  Hyperlipidemia Continue Crestor.  Generalized anxiety disorder Continue sertraline.  DVT Prophylaxis: On subcutaneous heparin Code Status: Full code Family Communication: Discussed with patient Disposition Plan: Patient prefers to return home at discharge.  Await PT and OT evaluation.  Status is: Inpatient Remains inpatient appropriate because: Acute stroke      Medications: Scheduled:   stroke: early stages of recovery book   Does not apply Once   aspirin EC  81 mg Oral Daily   clopidogrel  75 mg Oral Daily   heparin  5,000 Units Subcutaneous Q8H   mirtazapine  15 mg Oral QHS   pantoprazole  40 mg Oral Daily   rosuvastatin  20 mg Oral Daily   sertraline  25 mg Oral QHS   sodium chloride flush  3-10 mL Intravenous Q12H   Continuous:  lactated ringers 75 mL/hr at 10/08/23 0844   PRN:[DISCONTINUED] acetaminophen **OR** [DISCONTINUED] acetaminophen (TYLENOL) oral liquid 160 mg/5 mL **OR** acetaminophen, sodium chloride flush  Antibiotics: Anti-infectives (From admission, onward)    None       Objective:  Vital Signs  Vitals:   10/08/23 0500 10/08/23 0600 10/08/23 0700 10/08/23 0834  BP: (!) 113/54 (!) 107/54 (!) 110/55 111/68  Pulse: 67 66 71 76  Resp: (!) 24 (!) 25 (!) 23 (!) 25  Temp:    97.9 F (36.6 C)  TempSrc:    Oral  SpO2: 93% 94% 94% 92%  Weight:  Height:        Intake/Output Summary (Last 24 hours) at 10/08/2023 0911 Last data filed at 10/07/2023 2354 Gross per 24 hour  Intake 1000 ml  Output --  Net 1000 ml   Filed Weights   10/07/23 1520  Weight: 60.8 kg    General appearance: Awake alert.  In no distress Resp: Clear to  auscultation bilaterally.  Normal effort Cardio: S1-S2 is normal regular.  No S3-S4.  No rubs murmurs or bruit GI: Abdomen is soft.  Nontender nondistended.  Bowel sounds are present normal.  No masses organomegaly Extremities: No edema.  Full range of motion of lower extremities. Neurologic: Oriented to place person year month.  Subtle right-sided weakness noted with mild right pronator drift.   Lab Results:  Data Reviewed: I have personally reviewed following labs and reports of the imaging studies  CBC: Recent Labs  Lab 10/07/23 1534 10/08/23 0430  WBC 14.5* 11.3*  HGB 12.7* 10.8*  HCT 38.5* 33.4*  MCV 94.4 95.4  PLT 242 211    Basic Metabolic Panel: Recent Labs  Lab 10/07/23 1534 10/08/23 0105  NA 140 138  K 3.6 3.6  CL 106 104  CO2 23 22  GLUCOSE 105* 106*  BUN 38* 34*  CREATININE 1.30* 1.06  CALCIUM 9.0 8.6*  MG  --  1.9    GFR: Estimated Creatinine Clearance: 42.2 mL/min (by C-G formula based on SCr of 1.06 mg/dL).  Liver Function Tests: Recent Labs  Lab 10/07/23 1534 10/08/23 0105  AST 20 16  ALT 14 14  ALKPHOS 57 47  BILITOT 0.9 0.9  PROT 7.5 6.5  ALBUMIN 3.4* 3.0*    HbA1C: Recent Labs    10/08/23 0430  HGBA1C 5.6    Lipid Profile: Recent Labs    10/08/23 0430  CHOL 101  HDL 48  LDLCALC 45  TRIG 42  CHOLHDL 2.1    Recent Results (from the past 240 hours)  Resp panel by RT-PCR (RSV, Flu A&B, Covid) Anterior Nasal Swab     Status: None   Collection Time: 10/08/23 12:23 AM   Specimen: Anterior Nasal Swab  Result Value Ref Range Status   SARS Coronavirus 2 by RT PCR NEGATIVE NEGATIVE Final   Influenza A by PCR NEGATIVE NEGATIVE Final   Influenza B by PCR NEGATIVE NEGATIVE Final    Comment: (NOTE) The Xpert Xpress SARS-CoV-2/FLU/RSV plus assay is intended as an aid in the diagnosis of influenza from Nasopharyngeal swab specimens and should not be used as a sole basis for treatment. Nasal washings and aspirates are unacceptable  for Xpert Xpress SARS-CoV-2/FLU/RSV testing.  Fact Sheet for Patients: BloggerCourse.com  Fact Sheet for Healthcare Providers: SeriousBroker.it  This test is not yet approved or cleared by the [place] FDA and has been authorized for detection and/or diagnosis of SARS-CoV-2 by FDA under an Emergency Use Authorization (EUA). This EUA will remain in effect (meaning this test can be used) for the duration of the COVID-19 declaration under Section 564(b)(1) of the Act, 21 U.S.C. section 360bbb-3(b)(1), unless the authorization is terminated or revoked.     Resp Syncytial Virus by PCR NEGATIVE NEGATIVE Final    Comment: (NOTE) Fact Sheet for Patients: BloggerCourse.com  Fact Sheet for Healthcare Providers: SeriousBroker.it  This test is not yet approved or cleared by the [place] FDA and has been authorized for detection and/or diagnosis of SARS-CoV-2 by FDA under an Emergency Use Authorization (EUA). This EUA will remain in effect (meaning this test can  be used) for the duration of the COVID-19 declaration under Section 564(b)(1) of the Act, 21 U.S.C. section 360bbb-3(b)(1), unless the authorization is terminated or revoked.  Performed at [facility], [address] [place], [place] [address]       Radiology Studies: CT ANGIO HEAD NECK W WO CM Result Date: 10/07/2023 CLINICAL DATA:  Stroke/TIA EXAM: CT ANGIOGRAPHY HEAD AND NECK WITH AND WITHOUT CONTRAST TECHNIQUE: Multidetector CT imaging of the head and neck was performed using the standard protocol during bolus administration of intravenous contrast. Multiplanar CT image reconstructions and MIPs were obtained to evaluate the vascular anatomy. Carotid stenosis measurements (when applicable) are obtained utilizing NASCET criteria, using the distal internal carotid diameter as the denominator. RADIATION DOSE  REDUCTION: This exam was performed according to the departmental dose-optimization program which includes automated exposure control, adjustment of the mA and/or kV according to patient size and/or use of iterative reconstruction technique. CONTRAST:  75mL OMNIPAQUE IOHEXOL 350 MG/ML SOLN COMPARISON:  12/30/2021 FINDINGS: CTA NECK FINDINGS Skeleton: No acute abnormality or high grade bony spinal canal stenosis. Other neck: Normal pharynx, larynx and major salivary glands. No cervical lymphadenopathy. Unremarkable thyroid gland. Upper chest: No pneumothorax or pleural effusion. No nodules or masses. Aortic arch: There is calcific atherosclerosis of the aortic arch. Conventional 3 vessel aortic branching pattern. RIGHT carotid system: No dissection, occlusion or aneurysm. Mild atherosclerotic calcification at the carotid bifurcation without hemodynamically significant stenosis. LEFT carotid system: No dissection, occlusion or aneurysm. Mild atherosclerotic calcification at the carotid bifurcation without hemodynamically significant stenosis. Vertebral arteries: Left dominant configuration. Mild narrowing of both vertebral artery origins. Otherwise, the vertebral arteries are normal to the skull base. CTA HEAD FINDINGS POSTERIOR CIRCULATION: Calcific atherosclerosis of the left V4 segment with less than 50% stenosis. No proximal occlusion of the anterior or inferior cerebellar arteries. Basilar artery is normal. Superior cerebellar arteries are normal. 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 ANTERIOR CIRCULATION: Atherosclerotic calcification of the internal carotid arteries at the skull base without hemodynamically significant stenosis. Anterior cerebral arteries are normal. Middle cerebral arteries are normal. Venous sinuses: As permitted by contrast timing, patent. Anatomic variants: None Review of the MIP images confirms the above findings. IMPRESSION: 1. No emergent large vessel occlusion. 2. 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 3. Mild bilateral carotid bifurcation atherosclerosis without hemodynamically significant stenosis. Aortic atherosclerosis (ICD10-I70.0). Electronically Signed   By: Deatra Robinson M.D.   On: 10/07/2023 23:21   MR BRAIN WO CONTRAST Result Date: 10/07/2023 CLINICAL DATA:  Transient ischemic attack EXAM: MRI HEAD WITHOUT CONTRAST TECHNIQUE: Multiplanar, multiecho pulse sequences of the brain and surrounding structures were obtained without intravenous contrast. COMPARISON:  Head CT earlier same day FINDINGS: Brain: Diffusion imaging shows an acute small vessel infarction in the left posteromedial cerebellum. No other acute insult. Old small vessel ischemic changes are present throughout the pons. Numerous old small vessel cerebellar infarctions. Old right inferior occipital cortical and subcortical infarction. Chronic small-vessel ischemic changes throughout the cerebral hemispheric white matter. Dilated perivascular spaces in basal ganglia regions. No large vessel territory stroke. No mass, hydrocephalus or extra-axial collection. Old hemosiderin deposition in the right parietal region relating to a subcortical infarction. Vascular: Major vessels at the base of the brain show flow. Skull and upper cervical spine: Negative Sinuses/Orbits: Clear/normal Other: None IMPRESSION: 1. Acute small vessel infarction in the left posteromedial cerebellum. No swelling or hemorrhage. 2. Old small vessel ischemic changes throughout the pons, cerebellum, right occipital  lobe and cerebral hemispheric white matter. Old hemosiderin deposition in the right parietal region relating to a subcortical infarction. Electronically Signed   By: Paulina Fusi M.D.   On: 10/07/2023 20:05   CT Head Wo Contrast Result Date: 10/07/2023 CLINICAL DATA:  Mental status change of unknown cause EXAM: CT HEAD WITHOUT CONTRAST TECHNIQUE: Contiguous axial images were obtained from the base of the skull  through the vertex without intravenous contrast. RADIATION DOSE REDUCTION: This exam was performed according to the departmental dose-optimization program which includes automated exposure control, adjustment of the mA and/or kV according to patient size and/or use of iterative reconstruction technique. COMPARISON:  12/30/2021 FINDINGS: Brain: Generalized atrophy without subjective lobar predominance. Extensive chronic small-vessel ischemic changes of pons. Old small vessel infarctions of the cerebellum. Old ischemic changes of the right thalamus. Chronic small-vessel ischemic changes throughout the cerebral hemispheric white matter. No sign of acute infarction, mass lesion, hemorrhage, hydrocephalus or extra-axial collection. Vascular: There is atherosclerotic calcification of the major vessels at the base of the brain. Skull: Negative Sinuses/Orbits: Clear/normal Other: None IMPRESSION: No acute CT finding. Atrophy and chronic small-vessel ischemic changes as outlined above. Old small vessel infarctions of the cerebellum, right thalamus and pons. Electronically Signed   By: Paulina Fusi M.D.   On: 10/07/2023 19:44   DG Chest 2 View Result Date: 10/07/2023 CLINICAL DATA:  Cough. EXAM: CHEST - 2 VIEW COMPARISON:  03/12/2023. FINDINGS: Bilateral lung fields are clear. Bilateral costophrenic angles are clear. Elevated right hemidiaphragm again seen. Stable cardio-mediastinal silhouette. No acute osseous abnormalities. Old healed posterior right sixth rib fracture noted. The soft tissues are within normal limits. IMPRESSION: No active cardiopulmonary disease. Electronically Signed   By: Jules Schick M.D.   On: 10/07/2023 16:31       LOS: 1 day   [employer]  [facility] Pager on [email or web address]  10/08/2023, 9:11 AM

## 2023-10-08 NOTE — Progress Notes (Signed)
 OT Cancellation Note  Patient Details Name: Clarence Myers MRN: [id number] DOB: [birth year]   Cancelled Treatment:    Reason Eval/Treat Not Completed: Active bedrest order.  Will await updated activity orders to see for OT eval.    Perrin Maltese, OTR/L Acute Rehabilitation Services  Office [phone] 10/08/2023

## 2023-10-11 DIAGNOSIS — J690 Pneumonitis due to inhalation of food and vomit: Secondary | ICD-10-CM | POA: Diagnosis not present

## 2023-10-11 DIAGNOSIS — I2510 Atherosclerotic heart disease of native coronary artery without angina pectoris: Secondary | ICD-10-CM | POA: Diagnosis not present

## 2023-10-11 DIAGNOSIS — I5032 Chronic diastolic (congestive) heart failure: Secondary | ICD-10-CM | POA: Diagnosis not present

## 2023-10-11 DIAGNOSIS — I69344 Monoplegia of lower limb following cerebral infarction affecting left non-dominant side: Secondary | ICD-10-CM | POA: Diagnosis not present

## 2023-10-11 DIAGNOSIS — N182 Chronic kidney disease, stage 2 (mild): Secondary | ICD-10-CM | POA: Diagnosis not present

## 2023-10-11 DIAGNOSIS — I69341 Monoplegia of lower limb following cerebral infarction affecting right dominant side: Secondary | ICD-10-CM | POA: Diagnosis not present

## 2023-10-11 DIAGNOSIS — E785 Hyperlipidemia, unspecified: Secondary | ICD-10-CM | POA: Diagnosis not present

## 2023-10-11 DIAGNOSIS — D631 Anemia in chronic kidney disease: Secondary | ICD-10-CM | POA: Diagnosis not present

## 2023-10-11 DIAGNOSIS — K219 Gastro-esophageal reflux disease without esophagitis: Secondary | ICD-10-CM | POA: Diagnosis not present

## 2023-10-11 DIAGNOSIS — I639 Cerebral infarction, unspecified: Secondary | ICD-10-CM | POA: Diagnosis not present

## 2023-10-11 DIAGNOSIS — N179 Acute kidney failure, unspecified: Secondary | ICD-10-CM | POA: Diagnosis not present

## 2023-10-11 DIAGNOSIS — F411 Generalized anxiety disorder: Secondary | ICD-10-CM | POA: Diagnosis not present

## 2023-10-11 DIAGNOSIS — F319 Bipolar disorder, unspecified: Secondary | ICD-10-CM | POA: Diagnosis not present

## 2023-10-11 DIAGNOSIS — I130 Hypertensive heart and chronic kidney disease with heart failure and stage 1 through stage 4 chronic kidney disease, or unspecified chronic kidney disease: Secondary | ICD-10-CM | POA: Diagnosis not present

## 2023-10-11 DIAGNOSIS — D72829 Elevated white blood cell count, unspecified: Secondary | ICD-10-CM | POA: Diagnosis not present

## 2023-10-11 DIAGNOSIS — I10 Essential (primary) hypertension: Secondary | ICD-10-CM | POA: Diagnosis not present

## 2023-10-11 DIAGNOSIS — Z8673 Personal history of transient ischemic attack (TIA), and cerebral infarction without residual deficits: Secondary | ICD-10-CM | POA: Diagnosis not present

## 2023-10-11 DIAGNOSIS — J9601 Acute respiratory failure with hypoxia: Secondary | ICD-10-CM | POA: Diagnosis not present

## 2023-10-11 DIAGNOSIS — I060 Rheumatic aortic stenosis: Secondary | ICD-10-CM | POA: Diagnosis not present

## 2023-10-23 ENCOUNTER — Ambulatory Visit: Payer: PPO | Admitting: Physician Assistant

## 2023-11-05 DIAGNOSIS — I679 Cerebrovascular disease, unspecified: Secondary | ICD-10-CM | POA: Diagnosis not present

## 2023-11-05 DIAGNOSIS — N179 Acute kidney failure, unspecified: Secondary | ICD-10-CM | POA: Diagnosis not present

## 2023-11-05 DIAGNOSIS — D649 Anemia, unspecified: Secondary | ICD-10-CM | POA: Diagnosis not present

## 2023-11-05 DIAGNOSIS — I69359 Hemiplegia and hemiparesis following cerebral infarction affecting unspecified side: Secondary | ICD-10-CM | POA: Diagnosis not present

## 2023-11-05 DIAGNOSIS — I69351 Hemiplegia and hemiparesis following cerebral infarction affecting right dominant side: Secondary | ICD-10-CM | POA: Diagnosis not present

## 2023-11-25 NOTE — Patient Instructions (Signed)
 Below is our plan:  Stroke:  left small linear cerebellar infarct, likely secondary to large vessel disease source from left VA stenosis vs. Small VA branch dissection with prolonged drastic coughing: Residual deficit: right arm weakness from CVA in 09/2021. Continue Plavix and aspirin until 01/05/2024 then aspirin alone. Continue rosuvastatin 20mg  daily for secondary stroke prevention.  Discussed secondary stroke prevention measures and importance of close PCP follow up for aggressive stroke risk factor management. I have gone over the pathophysiology of stroke, warning signs and symptoms, risk factors and their management in some detail with instructions to go to the closest emergency room for symptoms of concern. Hx of stroke: 09/2021 admitted for right sided weakness, slurred speech and difficulty with gait. MRI showed left CC genu and left pontine infarct. Old right parietal hemorrhagic infarct.CTA head and neck showed right P1 65-70% stenotic. 12/2021 admitted for left CR infarct. CTA showed right PCA occlusion and left VA and bilateral ICA siphon and bulb atherosclerosis. 30 day monitor negative.   HTN: BP goal <130/90.  Stable on metoprolol 12.5mg  BID per PCP HLD: LDL goal <70. Recent LDL 45. Continue rosuvastatin 20mg  per PCP.  Right sided weakness: continue to work with PT/OT. Use walker at all time to prevent falls. Consider neuro PT if interested. Fall precautions.  Choking episodes: consider swallowing evaluation. Consider ST if willing.   Goals:  1) Maintain strict control of hypertension with blood pressure goal below 130/90 2) Maintain good control of diabetes with hemoglobin A1c goal below 7%  3) Maintain good control of lipids with LDL cholesterol goal below 70 mg/dL.  4) Eat a healthy diet with plenty of whole grains, cereals, fruits and vegetables, exercise regularly and maintain ideal body weight    Resources: [email or web address].williams.biz/  Please make sure you are staying well hydrated. I recommend 50-60 ounces daily. Well balanced diet and regular exercise encouraged. Consistent sleep schedule with 6-8 hours recommended.   Please continue follow up with care team as directed.   Follow up with Dr Pearlean Brownie in 4-6 months   You may receive a survey regarding today's visit. I encourage you to leave honest feed back as I do use this information to improve patient care. Thank you for seeing me today!

## 2023-11-25 NOTE — Progress Notes (Unsigned)
 [facility] [address] [place]. [place] [address] [phone]       HOSPITAL FOLLOW UP NOTE  Clarence. Clarence Myers Date of Birth:  04-04-1934 Medical Record Number:  [id number]   Reason for Referral:  hospital stroke follow up    SUBJECTIVE:   CHIEF COMPLAINT:  No chief complaint on file.   HPI:   Clarence Myers is a 89 y.o. who  has a past medical history of CAD (coronary artery disease), Hyperlipidemia, Low back strain, Shoulder injury, and Stroke (HCC).  Patient presented on 12/29/2021 with acute diffuse weakness upon waking that morning. He was having a difficult time standing up. MRI showed 9mm focus of diffusion in posterior left frontal corona radiata, likely small evolving subacute ischemic infarct. He was admitted for previous CVA in 09/2021 with residual right upper ext weakness. He was discharged home on Plavix at that time. With most recent CVA 12/29/2021, he was switched to [place] and asa continued. After 1 month he was to resume Plavix and discontinue asa 81mg . PT/OT recommended outpatient therapy. Personally reviewed hospitalization pertinent progress notes, lab work and imaging.  Evaluated by Roda Shutters.   Since being back home, he is doing fairly well. He did complete 30 day heart monitor. Per his daughter, no abnormal rhythm noted. He has follow up next month and will discuss possible loop recorder. He denies chest pain or shob. He continues to work with PT/OT. He feels that he is getting a little stronger as time passes. He does continues to have R>L weakness. He is ambulating with a walker. He did not bring walker with him, today. He feels that mood is ok. He is sleeping well. He continues Plavix. He is taking rosuvastatin as prescribed. BP is well managed on metoprolol 12.5mg  BID.   UPDATE 09/17/2022 ALL: Clarence Myers returns for follow up post CVA 09/2021 and 12/2021. He was last seen 02/2022 and doing fairly well. He was walking with a walker. He  presents with his daughter, today, who assists with HPI. He seemed to be doing fairly well over the summer but had a fall about a month ago resulting in 2 broken ribs. Since, he has had more difficulty with lower ext weakness. He is working with PT in the home twice a week. He is able to walk short distances with walker. He is able to complete ADLs with minimal assistance. He is eating normally and sleeps well. May be sleeping a little more than normal. He denies concerns of depression. He has a caregiver that checks on him daily. He lives with his wife who is in poor health. He continues Plavix and rosuvastatin. He has close follow up with PCP and cardiology.   UPDATE 03/19/2023 ALL: Clarence Myers for follow up post CVA 09/2021 and 12/2021. He was last seen 08/2022. He was working with PT following a fall resulting in two broken ribs. He was seen in ER at [facility] 10/2022 for concerns of aspiration pna. IV antibiotics given and sent home with oral meds. HH referral with ST placed. Follow up with pulmonology advised. Family does not remember hearing back from [facility] and has not seen pulmonology.   Since, he continues to have progressive weakness. He was able to ambulate with walker/cane but had another episode of choking last week. He was seen by PCP. CXR clear. Swallowing precautions advised. He has had a total of three episodes of choking since 09/2021. Since event last week, he is mobilizing with wheelchair. He  did not feel PT was very helpful but does continue HEP daily. He has a peddler he tries to use every day but admits that some days he just doesn't feel like it. He continues Plavix and rosuvastatin. He continues close follow up with PCP and cardiology.   UPDATE 11/26/2023 ALL: Clarence Myers returns for hospital follow up following new cerebellar stroke 10/07/2023. He presented to the ER with generalized weakness, confusion and an episode of choking. CT showed no acute findings. CTA head and neck showed right  P1 occlusion, left V4 atherosclerosis with mild to moderate stenosis. MRI showed left small linear cerebellar infarct.    Started on DAPT with asa and Plavix for three months then asa alone. Previously taking Plavix.   LDL 45. A1C 5.6. he continues rosuvastatin 20mg  daily.   Therapy eval recommended HH PT/OT.    PERTINENT IMAGING/LABS  MRI left CR small infarct CTA head and neck right PCA chronic occlusion, left VA, bilateral siphon and bilateral ICA bulb atherosclerosis 2D Echo EF 65 to 70% in 09/2021 30 day monitor negative for afib  consider loop recorder   A1C Lab Results  Component Value Date   HGBA1C 5.6 10/08/2023    Lipid Panel     Component Value Date/Time   CHOL 101 10/08/2023 0430   CHOL 156 03/21/2022 1423   TRIG 42 10/08/2023 0430   HDL 48 10/08/2023 0430   HDL 46 03/21/2022 1423   CHOLHDL 2.1 10/08/2023 0430   VLDL 8 10/08/2023 0430   LDLCALC 45 10/08/2023 0430   LDLCALC 77 03/21/2022 1423   LABVLDL 33 03/21/2022 1423    ROS:   14 system review of systems performed and negative with exception of those listed in HPI  PMH:  Past Medical History:  Diagnosis Date   CAD (coronary artery disease)    Hyperlipidemia    Low back strain    Shoulder injury    Fell and injured Right shoulder   Stroke (HCC)     PSH:  Past Surgical History:  Procedure Laterality Date   ANGIOPLASTY     1992-LAD   CARDIAC CATHETERIZATION      Social History:  Social History   Socioeconomic History   Marital status: Married    Spouse name: Not on file   Number of children: Not on file   Years of education: Not on file   Highest education level: Not on file  Occupational History   Not on file  Tobacco Use   Smoking status: Never   Smokeless tobacco: Never  Vaping Use   Vaping status: Never Used  Substance and Sexual Activity   Alcohol use: Never   Drug use: Never   Sexual activity: Not on file  Other Topics Concern   Not on file  Social History Narrative    Not on file   Social Drivers of Health   Financial Resource Strain: Not on file  Food Insecurity: No Food Insecurity (10/08/2023)   Hunger Vital Sign    Worried About Running Out of Food in the Last Year: Never true    Ran Out of Food in the Last Year: Never true  Transportation Needs: No Transportation Needs (10/08/2023)   PRAPARE - Administrator, Civil Service (Medical): No    Lack of Transportation (Non-Medical): No  Physical Activity: Not on file  Stress: Not on file  Social Connections: Moderately Isolated (10/08/2023)   Social Connection and Isolation Panel [NHANES]    Frequency of Communication with Friends  and Family: More than three times a week    Frequency of Social Gatherings with Friends and Family: More than three times a week    Attends Religious Services: Never    Database administrator or Organizations: No    Attends Banker Meetings: Never    Marital Status: Married  Catering manager Violence: Not At Risk (10/08/2023)   Humiliation, Afraid, Rape, and Kick questionnaire    Fear of Current or Ex-Partner: No    Emotionally Abused: No    Physically Abused: No    Sexually Abused: No    Family History: No family history on file.  Medications:   Current Outpatient Medications on File Prior to Visit  Medication Sig Dispense Refill   acetaminophen (TYLENOL) 325 MG tablet Take 2 tablets (650 mg total) by mouth every 4 (four) hours as needed for mild pain (or temp > 37.5 C (99.5 F)).     aspirin EC 81 MG tablet Take 1 tablet (81 mg total) by mouth daily. Swallow whole. Take for only 3 months. 30 tablet 2   clopidogrel (PLAVIX) 75 MG tablet Take 1 tablet (75 mg total) by mouth daily. RESUME ONLY AFTER COMPLETION OF 30 DAYS OF ASPIRIN AND BRILINTA     metoprolol tartrate (LOPRESSOR) 25 MG tablet Take 12.5 mg by mouth 2 (two) times daily.     mirtazapine (REMERON) 15 MG tablet Take 15 mg by mouth at bedtime.     Multiple Vitamin (MULTI-VITAMIN PO)  Take by mouth daily.       multivitamin-lutein (OCUVITE-LUTEIN) CAPS Take 1 capsule by mouth daily.       Omega-3 Fatty Acids (FISH OIL PO) Take 1,000 mg by mouth in the morning and at bedtime.     omeprazole (PRILOSEC) 20 MG capsule Take 20 mg by mouth daily.     Probiotic Product (PROBIOTIC DAILY PO) Take 1 capsule by mouth daily.     RESTASIS 0.05 % ophthalmic emulsion Place 1 drop into both eyes 2 (two) times daily.     rosuvastatin (CRESTOR) 20 MG tablet Take 1 tablet (20 mg total) by mouth daily. 30 tablet 3   sertraline (ZOLOFT) 25 MG tablet Take 25 mg by mouth at bedtime.     No current facility-administered medications on file prior to visit.    Allergies:   Allergies  Allergen Reactions   Bee Venom Swelling   Blue Crab (Cavinectes Sapidus) Allergy Skin Test Hives, Swelling and Cough   Shellfish-Derived Products Hives     OBJECTIVE:  Physical Exam  There were no vitals filed for this visit.    There is no height or weight on file to calculate BMI. No results found.      No data to display           General: well developed, well nourished, seated, in no evident distress Head: head normocephalic and atraumatic.   Neck: supple with no carotid or supraclavicular bruits Cardiovascular: regular rate and rhythm, occasional PVC, systolic murmur noted  Musculoskeletal: no deformity Skin:  no rash/petichiae Vascular:  Normal pulses all extremities   Neurologic Exam Mental Status: Awake and fully alert.  Fluent speech and language.  Oriented to place and time. Recent and remote memory intact. Attention span, concentration and fund of knowledge appropriate. Mood and affect appropriate.  Cranial Nerves: Fundoscopic exam reveals sharp disc margins. Pupils equal, briskly reactive to light. Extraocular movements full without nystagmus. Visual fields full to confrontation. Hearing intact. Facial sensation intact. Face,  tongue, palate moves normally and symmetrically.  Motor:  Normal bulk and tone. Normal strength in all tested extremity muscles with exception of very slight weakness notes in right upper and lower ext (4+/5) Sensory.: intact to touch , pinprick , position and vibratory sensation.  Coordination: Rapid alternating movements normal in all extremities. Finger-to-nose and heel-to-shin performed accurately bilaterally. Gait and Station: Patient presents in wheelchair, today.  Reflexes: 1+ and symmetric   NIHSS  2 Modified Rankin  0    ASSESSMENT: Clarence Myers is a 89 y.o. year old male presenting to the ER 10/07/2023 with generalized weakness. Vascular risk factors include HTN, HLD, advanced age, previous CVA.    PLAN:  Stroke:  left small linear cerebellar infarct, likely secondary to large vessel disease source from left VA stenosis vs. Small VA branch dissection with prolonged drastic coughing: Residual deficit: right arm weakness from CVA in 09/2021. Continue Plavix and aspirin until 01/05/2024 then aspirin alone. Continue rosuvastatin 20mg  daily for secondary stroke prevention.  Discussed secondary stroke prevention measures and importance of close PCP follow up for aggressive stroke risk factor management. I have gone over the pathophysiology of stroke, warning signs and symptoms, risk factors and their management in some detail with instructions to go to the closest emergency room for symptoms of concern. HTN: BP goal <130/90.  Stable on metoprolol 12.5mg  BID per PCP HLD: LDL goal <70. Recent LDL 97. Continue rosuvastatin 20mg  per PCP.  Right sided weakness: consider continuing to work with PT/OT. Use walker at all time to prevent falls. Consider neuro PT if interested. Fall precautions.  Choking episodes: consider swallowing evaluation. Consider ST if willing.    Follow up as needed   CC:  [facility] provider: Dr. Pearlean Brownie PCP: Lupita Raider, MD    I spent 45 minutes of face-to-face and non-face-to-face time with patient.  This included previsit  chart review including review of recent hospitalization, lab review, study review, order entry, electronic health record documentation, patient education regarding recent stroke including etiology, secondary stroke prevention measures and importance of managing stroke risk factors, residual deficits and typical recovery time and answered all other questions to patient satisfaction   Shawnie Dapper, [facility]  [facility] [address] [place], [place] [address]  Phone [phone] Fax [phone] Note: This document was prepared with digital dictation and possible smart phrase technology. Any transcriptional errors that result from this process are unintentional.

## 2023-11-26 ENCOUNTER — Ambulatory Visit: Payer: PPO | Admitting: Family Medicine

## 2023-11-26 ENCOUNTER — Encounter: Payer: Self-pay | Admitting: Family Medicine

## 2023-11-26 VITALS — BP 140/77 | HR 75 | Ht 67.0 in | Wt 146.0 lb

## 2023-11-26 DIAGNOSIS — I6523 Occlusion and stenosis of bilateral carotid arteries: Secondary | ICD-10-CM

## 2023-11-26 DIAGNOSIS — I639 Cerebral infarction, unspecified: Secondary | ICD-10-CM

## 2023-11-28 ENCOUNTER — Encounter: Payer: Self-pay | Admitting: Family Medicine

## 2023-11-29 NOTE — Progress Notes (Signed)
 I agree with the above plan

## 2023-12-09 ENCOUNTER — Other Ambulatory Visit

## 2023-12-10 DIAGNOSIS — N182 Chronic kidney disease, stage 2 (mild): Secondary | ICD-10-CM | POA: Diagnosis not present

## 2023-12-10 DIAGNOSIS — E785 Hyperlipidemia, unspecified: Secondary | ICD-10-CM | POA: Diagnosis not present

## 2023-12-10 DIAGNOSIS — F411 Generalized anxiety disorder: Secondary | ICD-10-CM | POA: Diagnosis not present

## 2023-12-10 DIAGNOSIS — I2510 Atherosclerotic heart disease of native coronary artery without angina pectoris: Secondary | ICD-10-CM | POA: Diagnosis not present

## 2023-12-10 DIAGNOSIS — I69344 Monoplegia of lower limb following cerebral infarction affecting left non-dominant side: Secondary | ICD-10-CM | POA: Diagnosis not present

## 2023-12-10 DIAGNOSIS — K219 Gastro-esophageal reflux disease without esophagitis: Secondary | ICD-10-CM | POA: Diagnosis not present

## 2023-12-10 DIAGNOSIS — I060 Rheumatic aortic stenosis: Secondary | ICD-10-CM | POA: Diagnosis not present

## 2023-12-10 DIAGNOSIS — I130 Hypertensive heart and chronic kidney disease with heart failure and stage 1 through stage 4 chronic kidney disease, or unspecified chronic kidney disease: Secondary | ICD-10-CM | POA: Diagnosis not present

## 2023-12-10 DIAGNOSIS — I69341 Monoplegia of lower limb following cerebral infarction affecting right dominant side: Secondary | ICD-10-CM | POA: Diagnosis not present

## 2023-12-10 DIAGNOSIS — E559 Vitamin D deficiency, unspecified: Secondary | ICD-10-CM | POA: Diagnosis not present

## 2023-12-10 DIAGNOSIS — I5032 Chronic diastolic (congestive) heart failure: Secondary | ICD-10-CM | POA: Diagnosis not present

## 2023-12-10 DIAGNOSIS — D631 Anemia in chronic kidney disease: Secondary | ICD-10-CM | POA: Diagnosis not present

## 2023-12-31 ENCOUNTER — Ambulatory Visit
Admission: RE | Admit: 2023-12-31 | Discharge: 2023-12-31 | Disposition: A | Discharge status: Home / Self Care | Source: Ambulatory Visit | Attending: Family Medicine | Admitting: Family Medicine

## 2023-12-31 DIAGNOSIS — R0989 Other specified symptoms and signs involving the circulatory and respiratory systems: Secondary | ICD-10-CM | POA: Diagnosis not present

## 2023-12-31 DIAGNOSIS — I639 Cerebral infarction, unspecified: Secondary | ICD-10-CM

## 2023-12-31 DIAGNOSIS — I6523 Occlusion and stenosis of bilateral carotid arteries: Secondary | ICD-10-CM

## 2024-01-02 ENCOUNTER — Ambulatory Visit: Payer: Self-pay | Admitting: Family Medicine

## 2024-01-02 NOTE — Telephone Encounter (Signed)
-----   Message from Amy Lomax sent at 01/02/2024  4:34 PM EDT ----- Please let him know that the CUS results look good. No significant plaque. Follow up closely with care team.

## 2024-01-02 NOTE — Telephone Encounter (Signed)
 Called daughter/Lisa at [phone]. Relayed results per Amy note. She verbalized understanding.

## 2024-01-10 ENCOUNTER — Inpatient Hospital Stay (HOSPITAL_COMMUNITY)
Admission: EM | Admit: 2024-01-10 | Discharge: 2024-01-14 | DRG: 177 | Disposition: A | Discharge status: Home Health | Attending: Family Medicine | Admitting: Family Medicine

## 2024-01-10 ENCOUNTER — Emergency Department (HOSPITAL_COMMUNITY)

## 2024-01-10 ENCOUNTER — Encounter (HOSPITAL_COMMUNITY): Payer: Self-pay

## 2024-01-10 ENCOUNTER — Other Ambulatory Visit: Payer: Self-pay

## 2024-01-10 DIAGNOSIS — R1111 Vomiting without nausea: Secondary | ICD-10-CM | POA: Diagnosis not present

## 2024-01-10 DIAGNOSIS — F411 Generalized anxiety disorder: Secondary | ICD-10-CM | POA: Diagnosis present

## 2024-01-10 DIAGNOSIS — Z91013 Allergy to seafood: Secondary | ICD-10-CM

## 2024-01-10 DIAGNOSIS — I10 Essential (primary) hypertension: Secondary | ICD-10-CM | POA: Diagnosis not present

## 2024-01-10 DIAGNOSIS — Z8673 Personal history of transient ischemic attack (TIA), and cerebral infarction without residual deficits: Secondary | ICD-10-CM | POA: Diagnosis not present

## 2024-01-10 DIAGNOSIS — Z91030 Bee allergy status: Secondary | ICD-10-CM

## 2024-01-10 DIAGNOSIS — J90 Pleural effusion, not elsewhere classified: Secondary | ICD-10-CM | POA: Diagnosis present

## 2024-01-10 DIAGNOSIS — K219 Gastro-esophageal reflux disease without esophagitis: Secondary | ICD-10-CM | POA: Diagnosis present

## 2024-01-10 DIAGNOSIS — I2510 Atherosclerotic heart disease of native coronary artery without angina pectoris: Secondary | ICD-10-CM | POA: Diagnosis not present

## 2024-01-10 DIAGNOSIS — J9601 Acute respiratory failure with hypoxia: Secondary | ICD-10-CM | POA: Diagnosis present

## 2024-01-10 DIAGNOSIS — I130 Hypertensive heart and chronic kidney disease with heart failure and stage 1 through stage 4 chronic kidney disease, or unspecified chronic kidney disease: Secondary | ICD-10-CM | POA: Diagnosis present

## 2024-01-10 DIAGNOSIS — E782 Mixed hyperlipidemia: Secondary | ICD-10-CM

## 2024-01-10 DIAGNOSIS — I5032 Chronic diastolic (congestive) heart failure: Secondary | ICD-10-CM | POA: Diagnosis not present

## 2024-01-10 DIAGNOSIS — J9811 Atelectasis: Secondary | ICD-10-CM | POA: Diagnosis present

## 2024-01-10 DIAGNOSIS — J189 Pneumonia, unspecified organism: Secondary | ICD-10-CM | POA: Diagnosis not present

## 2024-01-10 DIAGNOSIS — R059 Cough, unspecified: Secondary | ICD-10-CM | POA: Diagnosis not present

## 2024-01-10 DIAGNOSIS — R0989 Other specified symptoms and signs involving the circulatory and respiratory systems: Secondary | ICD-10-CM | POA: Diagnosis not present

## 2024-01-10 DIAGNOSIS — J690 Pneumonitis due to inhalation of food and vomit: Secondary | ICD-10-CM | POA: Diagnosis not present

## 2024-01-10 DIAGNOSIS — N182 Chronic kidney disease, stage 2 (mild): Secondary | ICD-10-CM | POA: Diagnosis not present

## 2024-01-10 DIAGNOSIS — Z79899 Other long term (current) drug therapy: Secondary | ICD-10-CM

## 2024-01-10 DIAGNOSIS — N179 Acute kidney failure, unspecified: Secondary | ICD-10-CM | POA: Diagnosis present

## 2024-01-10 DIAGNOSIS — F319 Bipolar disorder, unspecified: Secondary | ICD-10-CM | POA: Diagnosis present

## 2024-01-10 DIAGNOSIS — H409 Unspecified glaucoma: Secondary | ICD-10-CM | POA: Diagnosis present

## 2024-01-10 DIAGNOSIS — H3530 Unspecified macular degeneration: Secondary | ICD-10-CM | POA: Diagnosis present

## 2024-01-10 DIAGNOSIS — R918 Other nonspecific abnormal finding of lung field: Secondary | ICD-10-CM | POA: Diagnosis not present

## 2024-01-10 DIAGNOSIS — R0902 Hypoxemia: Secondary | ICD-10-CM | POA: Diagnosis not present

## 2024-01-10 DIAGNOSIS — E785 Hyperlipidemia, unspecified: Secondary | ICD-10-CM | POA: Diagnosis present

## 2024-01-10 DIAGNOSIS — Z7902 Long term (current) use of antithrombotics/antiplatelets: Secondary | ICD-10-CM

## 2024-01-10 DIAGNOSIS — I700 Atherosclerosis of aorta: Secondary | ICD-10-CM | POA: Diagnosis not present

## 2024-01-10 DIAGNOSIS — I959 Hypotension, unspecified: Secondary | ICD-10-CM | POA: Diagnosis not present

## 2024-01-10 LAB — COMPREHENSIVE METABOLIC PANEL WITH GFR
ALT: 14 U/L (ref 0–44)
AST: 24 U/L (ref 15–41)
Albumin: 3.7 g/dL (ref 3.5–5.0)
Alkaline Phosphatase: 64 U/L (ref 38–126)
Anion gap: 9 (ref 5–15)
BUN: 29 mg/dL — ABNORMAL HIGH (ref 8–23)
CO2: 26 mmol/L (ref 22–32)
Calcium: 9.4 mg/dL (ref 8.9–10.3)
Chloride: 107 mmol/L (ref 98–111)
Creatinine, Ser: 1.14 mg/dL (ref 0.61–1.24)
GFR, Estimated: >60 mL/min (ref >60)
Glucose, Bld: 120 mg/dL — ABNORMAL HIGH (ref 70–99)
Potassium: 3.9 mmol/L (ref 3.5–5.1)
Sodium: 142 mmol/L (ref 135–145)
Total Bilirubin: 0.8 mg/dL (ref 0.0–1.2)
Total Protein: 7.9 g/dL (ref 6.5–8.1)

## 2024-01-10 LAB — CBC WITH DIFFERENTIAL/PLATELET
Abs Immature Granulocytes: 0.09 10*3/uL — ABNORMAL HIGH (ref 0.00–0.07)
Basophils Absolute: 0.1 10*3/uL (ref 0.0–0.1)
Basophils Relative: 0 %
Eosinophils Absolute: 0.1 10*3/uL (ref 0.0–0.5)
Eosinophils Relative: 0 %
HCT: 41.5 % (ref 39.0–52.0)
Hemoglobin: 13.8 g/dL (ref 13.0–17.0)
Immature Granulocytes: 1 %
Lymphocytes Relative: 9 %
Lymphs Abs: 1.5 10*3/uL (ref 0.7–4.0)
MCH: 31.2 pg (ref 26.0–34.0)
MCHC: 33.3 g/dL (ref 30.0–36.0)
MCV: 93.7 fL (ref 80.0–100.0)
Monocytes Absolute: 1 10*3/uL (ref 0.1–1.0)
Monocytes Relative: 6 %
Neutro Abs: 14 10*3/uL — ABNORMAL HIGH (ref 1.7–7.7)
Neutrophils Relative %: 84 %
Platelets: 266 10*3/uL (ref 150–400)
RBC: 4.43 MIL/uL (ref 4.22–5.81)
RDW: 13.2 % (ref 11.5–15.5)
WBC: 16.7 10*3/uL — ABNORMAL HIGH (ref 4.0–10.5)
nRBC: 0 % (ref 0.0–0.2)

## 2024-01-10 LAB — I-STAT VENOUS BLOOD GAS, ED
Acid-Base Excess: 2 mmol/L (ref 0.0–2.0)
Bicarbonate: 28 mmol/L (ref 20.0–28.0)
Calcium, Ion: 1.2 mmol/L (ref 1.15–1.40)
HCT: 40 % (ref 39.0–52.0)
Hemoglobin: 13.6 g/dL (ref 13.0–17.0)
O2 Saturation: 71 %
Potassium: 3.8 mmol/L (ref 3.5–5.1)
Sodium: 143 mmol/L (ref 135–145)
TCO2: 29 mmol/L (ref 22–32)
pCO2, Ven: 49.9 mmHg (ref 44–60)
pH, Ven: 7.357 (ref 7.25–7.43)
pO2, Ven: 40 mmHg (ref 32–45)

## 2024-01-10 MED ORDER — SODIUM CHLORIDE 0.9 % IV SOLN: INTRAVENOUS | Status: DC

## 2024-01-10 MED ORDER — ACETAMINOPHEN 325 MG PO TABS: 650.0000 mg | ORAL_TABLET | Freq: Four times a day (QID) | ORAL | Status: DC | PRN

## 2024-01-10 MED ORDER — ONDANSETRON HCL 4 MG/2ML IJ SOLN
4.0000 mg | Freq: Once | INTRAMUSCULAR | Status: AC
  Administered 2024-01-10: 4 mg via INTRAVENOUS
  Filled 2024-01-10: qty 2

## 2024-01-10 MED ORDER — SERTRALINE HCL 50 MG PO TABS
25.0000 mg | ORAL_TABLET | Freq: Every day | ORAL | Status: DC
  Administered 2024-01-10 – 2024-01-13 (×4): 25 mg via ORAL
  Filled 2024-01-10 (×3): qty 1
  Filled 2024-01-10: qty 0.5
  Filled 2024-01-10: qty 1
  Filled 2024-01-10 (×3): qty 0.5

## 2024-01-10 MED ORDER — ENOXAPARIN SODIUM 40 MG/0.4ML IJ SOSY
40.0000 mg | PREFILLED_SYRINGE | INTRAMUSCULAR | Status: DC
  Administered 2024-01-10 – 2024-01-13 (×4): 40 mg via SUBCUTANEOUS
  Filled 2024-01-10 (×4): qty 0.4

## 2024-01-10 MED ORDER — PANTOPRAZOLE SODIUM 40 MG PO TBEC
40.0000 mg | DELAYED_RELEASE_TABLET | Freq: Every day | ORAL | Status: DC
  Administered 2024-01-11 – 2024-01-14 (×4): 40 mg via ORAL
  Filled 2024-01-10 (×4): qty 1

## 2024-01-10 MED ORDER — ACETAMINOPHEN 650 MG RE SUPP: 650.0000 mg | Freq: Four times a day (QID) | RECTAL | Status: DC | PRN

## 2024-01-10 MED ORDER — ROSUVASTATIN CALCIUM 20 MG PO TABS
20.0000 mg | ORAL_TABLET | Freq: Every day | ORAL | Status: DC
  Administered 2024-01-10 – 2024-01-13 (×4): 20 mg via ORAL
  Filled 2024-01-10 (×4): qty 1

## 2024-01-10 MED ORDER — METOPROLOL TARTRATE 25 MG PO TABS
12.5000 mg | ORAL_TABLET | Freq: Two times a day (BID) | ORAL | Status: DC
  Administered 2024-01-10 – 2024-01-11 (×2): 12.5 mg via ORAL
  Filled 2024-01-10 (×2): qty 1

## 2024-01-10 MED ORDER — CLOPIDOGREL BISULFATE 75 MG PO TABS
75.0000 mg | ORAL_TABLET | Freq: Every day | ORAL | Status: DC
  Administered 2024-01-11 – 2024-01-14 (×4): 75 mg via ORAL
  Filled 2024-01-10 (×4): qty 1

## 2024-01-10 MED ORDER — MIRTAZAPINE 15 MG PO TABS
15.0000 mg | ORAL_TABLET | Freq: Every day | ORAL | Status: DC
  Administered 2024-01-10 – 2024-01-13 (×4): 15 mg via ORAL
  Filled 2024-01-10 (×4): qty 1

## 2024-01-10 MED ORDER — POLYETHYLENE GLYCOL 3350 17 G PO PACK: 17.0000 g | PACK | Freq: Every day | ORAL | Status: DC | PRN

## 2024-01-10 MED ORDER — SODIUM CHLORIDE 0.9% FLUSH
3.0000 mL | Freq: Two times a day (BID) | INTRAVENOUS | Status: DC
  Administered 2024-01-10 – 2024-01-13 (×7): 3 mL via INTRAVENOUS

## 2024-01-10 MED ORDER — SODIUM CHLORIDE 0.9 % IV BOLUS
250.0000 mL | Freq: Once | INTRAVENOUS | Status: AC
  Administered 2024-01-10: 250 mL via INTRAVENOUS

## 2024-01-10 NOTE — ED Triage Notes (Signed)
 Pt presents to ED via EMS from home after possible aspiration of water when he coughed.  Hx of cough for several months.  Per EMS pt family told medics on scene that "when he coughs like this he can have mini stroke".  Lungs CTA, no stridor.  Oxygen 91% RA initially, placed on oxygen at 2 LPM via [place], sat improved to 95%.  CBG 127.  Hx CVA 2023, HTN.  Pt is coughing during triage, able to maintain airway.

## 2024-01-10 NOTE — H&P (Signed)
 History and Physical   Clarence Myers OAC:[id number] DOB: 02-26-1934 DOA: 01/10/2024  PCP: Glena Landau, MD   Patient coming from: Home  Chief Complaint: Aspiration  HPI: Clarence Myers is a 89 y.o. male with medical history significant of hypertension, hyperlipidemia, CVA, GERD, CAD, CKD 2, chronic diastolic CHF, anxiety presenting after aspiration event.  Patient was reportedly drinking water earlier today and had a choking sensation and then coughed.  He did not have any food at this time.  No fever no shortness of breath.  He has had issues with aspiration/choking sensation in the past.  Denies chills, chest pain, abdominal pain, constipation, diarrhea, nausea, vomiting.  ED Course: Vital signs in the ED notable for heart rate in the 90s-100s, blood pressure in the 130s-150s systolic, requiring 2 L to maintain saturations.  Lab workup included CMP with BUN 29, glucose 120.  CBC with leukocytosis to 16.7.  VBG normal.  Chest x-ray suspicious for aspiration but final read pending.  No initial interventions in the ED.  Review of Systems: As per HPI otherwise all other systems reviewed and are negative.  Past Medical History:  Diagnosis Date   Acute ischemic stroke (HCC) 12/30/2021   CAD (coronary artery disease)    CVA (cerebral vascular accident) (HCC) 09/29/2021   Hyperlipidemia    Hypokalemia 09/29/2021   Low back strain    Shoulder injury    Fell and injured Right shoulder   Stroke [facility])     Past Surgical History:  Procedure Laterality Date   ANGIOPLASTY     1992-LAD   CARDIAC CATHETERIZATION      Social History  reports that he has never smoked. He has never used smokeless tobacco. He reports that he does not drink alcohol and does not use drugs.  Allergies  Allergen Reactions   Bee Venom Swelling   Blue Crab (Cavinectes Sapidus) Allergy Skin Test Hives, Swelling and Cough   Shellfish-Derived Products Hives    History reviewed. No pertinent family  history.   Prior to Admission medications   Medication Sig Start Date End Date Taking? Authorizing Provider  acetaminophen  (TYLENOL ) 325 MG tablet Take 2 tablets (650 mg total) by mouth every 4 (four) hours as needed for mild pain (or temp > 37.5 C (99.5 F)). 10/09/21   Abbe Abate, MD  clopidogrel  (PLAVIX ) 75 MG tablet Take 1 tablet (75 mg total) by mouth daily. RESUME ONLY AFTER COMPLETION OF 30 DAYS OF ASPIRIN  AND BRILINTA  01/31/22   Krishnan, Gokul, MD  metoprolol  tartrate (LOPRESSOR ) 25 MG tablet Take 12.5 mg by mouth 2 (two) times daily. 08/07/21   [provider]  mirtazapine  (REMERON ) 15 MG tablet Take 15 mg by mouth at bedtime. 11/13/21   [provider]  Multiple Vitamin (MULTI-VITAMIN PO) Take by mouth daily.      [provider]  multivitamin-lutein  (OCUVITE-LUTEIN ) CAPS Take 1 capsule by mouth daily.      [provider]  Omega-3 Fatty Acids (FISH OIL PO) Take 1,000 mg by mouth in the morning and at bedtime.    [provider]  omeprazole (PRILOSEC) 20 MG capsule Take 20 mg by mouth daily. 01/21/21   [provider]  Probiotic Product (PROBIOTIC DAILY PO) Take 1 capsule by mouth daily.    [provider]  RESTASIS  0.05 % ophthalmic emulsion Place 1 drop into both eyes 2 (two) times daily. 10/25/20   [provider]  rosuvastatin  (CRESTOR ) 20 MG tablet Take 1 tablet (20 mg total)  by mouth daily. 12/31/21   Krishnan, Gokul, MD  sertraline  (ZOLOFT ) 25 MG tablet Take 25 mg by mouth at bedtime.    [provider]    Physical Exam: Vitals:   01/10/24 1630 01/10/24 1645 01/10/24 1700 01/10/24 1715  BP: (!) 147/91 (!) 146/90 (!) 142/88 (!) 152/90  Pulse: 95 98 (!) 107 (!) 107  Resp:      Temp:      TempSrc:      SpO2: 97% 97% 97% 94%  Weight:      Height:        Physical Exam Constitutional:      General: He is not in acute distress.    Appearance: Normal appearance.  HENT:     Head:  Normocephalic and atraumatic.     Mouth/Throat:     Mouth: Mucous membranes are moist.     Pharynx: Oropharynx is clear.  Eyes:     Extraocular Movements: Extraocular movements intact.     Pupils: Pupils are equal, round, and reactive to light.  Cardiovascular:     Rate and Rhythm: Normal rate and regular rhythm.     Pulses: Normal pulses.     Heart sounds: Normal heart sounds.  Pulmonary:     Effort: Pulmonary effort is normal. No respiratory distress.     Breath sounds: Rales present.  Abdominal:     General: Bowel sounds are normal. There is no distension.     Palpations: Abdomen is soft.     Tenderness: There is no abdominal tenderness.  Musculoskeletal:        General: No swelling or deformity.  Skin:    General: Skin is warm and dry.  Neurological:     General: No focal deficit present.     Mental Status: Mental status is at baseline.     Labs on Admission: I have personally reviewed following labs and imaging studies  CBC: Recent Labs  Lab 01/10/24 1630 01/10/24 1640  WBC 16.7*  --   NEUTROABS 14.0*  --   HGB 13.8 13.6  HCT 41.5 40.0  MCV 93.7  --   PLT 266  --     Basic Metabolic Panel: Recent Labs  Lab 01/10/24 1630 01/10/24 1640  NA 142 143  K 3.9 3.8  CL 107  --   CO2 26  --   GLUCOSE 120*  --   BUN 29*  --   CREATININE 1.14  --   CALCIUM  9.4  --     GFR: Estimated Creatinine Clearance: 41.9 mL/min (by C-G formula based on SCr of 1.14 mg/dL).  Liver Function Tests: Recent Labs  Lab 01/10/24 1630  AST 24  ALT 14  ALKPHOS 64  BILITOT 0.8  PROT 7.9  ALBUMIN 3.7    Urine analysis:    Component Value Date/Time   COLORURINE YELLOW 12/29/2021 2002   APPEARANCEUR CLEAR 12/29/2021 2002   LABSPEC 1.009 12/29/2021 2002   PHURINE 5.0 12/29/2021 2002   GLUCOSEU NEGATIVE 12/29/2021 2002   HGBUR NEGATIVE 12/29/2021 2002   BILIRUBINUR NEGATIVE 12/29/2021 2002   KETONESUR NEGATIVE 12/29/2021 2002   PROTEINUR NEGATIVE 12/29/2021 2002    UROBILINOGEN 0.2 09/02/2007 1611   NITRITE NEGATIVE 12/29/2021 2002   LEUKOCYTESUR NEGATIVE 12/29/2021 2002    Radiological Exams on Admission: No results found.  EKG: Not performed in the emergency department  Assessment/Plan Active Problems:   CAD (coronary artery disease)   Hyperlipidemia   Essential hypertension   History of CVA x 2 in  the past (cerebrovascular accident)   CKD (chronic kidney disease), stage II   GERD (gastroesophageal reflux disease)   Chronic diastolic CHF (congestive heart failure) (HCC)   GAD (generalized anxiety disorder)   Acute respiratory failure with hypoxia Aspiration pneumonitis > Patient presenting after aspiration event now hypoxic and requiring 2 L in the ED to maintain saturations. > History of prior.  Does have history of stroke but not significant dysphagia, has right-sided deficits. > Has had issues with aspiration in the past will likely benefit from repeat SLP evaluation. > Requiring 2 L supplemental oxygen to maintain saturation in the ED. - Monitor on telemetry overnight with submental oxygen - Hold off on any antibiotics for now - Trend fever curve and WBC - Swallow screen, SLP evaluation - Pulmonary toilet - Supportive care  Hypertension - Continue metoprolol   Hyperlipidemia - Continue rosuvastatin   History of CVA - Continue rosuvastatin  and Plavix   GERD - Continue home PPI  CAD - Continue home metoprolol , rosuvastatin , Plavix   CKD 2 > Creatinine stable in the ED - Trend renal function and electrolytes  Chronic diastolic CHF > Last echo was in February and showed EF 60-65%, G1 DD, normal RV function. - Not on diuretic - Continue metoprolol   Anxiety - Continue home sertraline  and mirtazapine    DVT prophylaxis: Lovenox  Code Status:   Full Family Communication:  Updated at bedside  Disposition Plan:   Patient is from:  Home  Anticipated DC to:  Home  Anticipated DC date:  1 to 3 days  Anticipated DC  barriers: None  Consults called:  None Admission status:  Observation, telemetry  Severity of Illness: The appropriate patient status for this patient is OBSERVATION. Observation status is judged to be reasonable and necessary in order to provide the required intensity of service to ensure the patient's safety. The patient's presenting symptoms, physical exam findings, and initial radiographic and laboratory data in the context of their medical condition is felt to place them at decreased risk for further clinical deterioration. Furthermore, it is anticipated that the patient will be medically stable for discharge from the hospital within 2 midnights of admission.    Johnetta Nab MD [facility]  How to contact the [facility] Attending or Consulting provider 7A - 7P or covering provider during after hours 7P -7A, for this patient?   Check the care team in [facility] and look for a) attending/consulting [facility] provider listed and b) the [facility] team listed Log into [email or web address] and use [facility] universal password to access. If you do not have the password, please contact the hospital operator. Locate the [facility] provider you are looking for under [facility] and page to a number that you can be directly reached. If you still have difficulty reaching the provider, please page the [facility] (Director on Call) for the Hospitalists listed on amion for assistance.  01/10/2024, 5:49 PM

## 2024-01-10 NOTE — ED Provider Notes (Signed)
 [place] EMERGENCY DEPARTMENT AT [facility] Provider Note  History  Chief Complaint:  Aspiration  The history is provided by the patient.     Clarence Myers is a 89 y.o. male with a history of CAD, prior CVA with residual right-sided deficits who presents to the emergency department after he was drinking water and became choked.  He reports that he was having a normal day prior to this.  He states he was drinking water when he started to cough.  He states he did not eat any food associated with this.  He has no fever.  He does report some shortness of breath.  States that every time he attempts to take a big deep breath he has the feeling to cough.  He reports no other weakness and numbness.  Past Medical History:  Diagnosis Date   CAD (coronary artery disease)    Hyperlipidemia    Low back strain    Shoulder injury    Fell and injured Right shoulder   Stroke [facility])     Past Surgical History:  Procedure Laterality Date   ANGIOPLASTY     1992-LAD   CARDIAC CATHETERIZATION      History reviewed. No pertinent family history.  Social History   Tobacco Use   Smoking status: Never   Smokeless tobacco: Never  Vaping Use   Vaping status: Never Used  Substance Use Topics   Alcohol use: Never   Drug use: Never    Review of Systems  Review of Systems   Reviewed and documented in HPI if pertinent.   Physical Exam   ED Triage Vitals  Encounter Vitals Group     BP 01/10/24 1541 (!) 154/82     Systolic BP Percentile --      Diastolic BP Percentile --      Pulse Rate 01/10/24 1541 (!) 102     Resp 01/10/24 1541 18     Temp 01/10/24 1541 98.4 F (36.9 C)     Temp Source 01/10/24 1541 Oral     SpO2 01/10/24 1541 94 %     Weight 01/10/24 1543 147 lb 4.3 oz (66.8 kg)     Height 01/10/24 1543 5\' 7"  (1.702 m)     Head Circumference --      Peak Flow --      Pain Score 01/10/24 1542 0     Pain Loc --      Pain Education --      Exclude from Growth Chart --       Physical Exam Vitals and nursing note reviewed.  Constitutional:      General: He is not in acute distress.    Appearance: He is well-developed.  HENT:     Head: Normocephalic and atraumatic.  Eyes:     Conjunctiva/sclera: Conjunctivae normal.  Cardiovascular:     Rate and Rhythm: Normal rate and regular rhythm.     Heart sounds: No murmur heard. Pulmonary:     Effort: Pulmonary effort is normal. No respiratory distress.     Breath sounds: Examination of the right-middle field reveals rhonchi. Examination of the right-lower field reveals rhonchi. Rhonchi present.  Abdominal:     Palpations: Abdomen is soft.     Tenderness: There is no abdominal tenderness.  Musculoskeletal:        General: No swelling.     Cervical back: Neck supple.     Right lower leg: No edema.     Left lower leg:  No edema.  Skin:    General: Skin is warm and dry.     Capillary Refill: Capillary refill takes less than 2 seconds.  Neurological:     Mental Status: He is alert.  Psychiatric:        Mood and Affect: Mood normal.      Procedures   Procedures  ED Course - Medical Decision Making  Brief Overview SHAUNAK KREIS is a 89 y.o. male who presents as per above.  I have reviewed the nursing documentation for past medical history, family history, and social history and agree.  I have reviewed the patient's vital signs. There are no abnormalities.  Initial Differential Diagnoses: I am primarily concerned for aspiration pneumonitis, aspiration pneumonia.  Therapies: These medications and interventions were provided for the patient while in the ED.  Medications - No data to display  Testing Results: On my interpretation labs are significant for : Mild leukocytosis  On my interpretation imaging is significant for: Chest x-ray with right lower lobe infiltrate  See the EMR for full details regarding lab and imaging results.   Medical Decision Making 89 year old male who  presents the emergency department after choking on water.  On exam patient does have crackles in the lower right lung field.  He does have frequent coughing.  He is protecting his airway.  He is afebrile.  Laboratory studies reveal mildly cytosis.  This is likely reactive in the setting of stress.  X-ray reveals a right lower lobe infiltrate.  Patient does have crackles on exam.  Patient states he was drinking water during this.  This likely represents an aspiration pneumonitis.  Patient is requiring 2 L nasal cannula to maintain oxygen saturations above 90% at rest.  Do feel the patient requires admission for observation and continued oxygen support.  I discussed with the hospitalist and we made the mutual decision not to initiate antibiotics given this was a witnessed choking event.  I updated family at the bedside regarding admission.  They are agreeable with plan.  Patient continuing to require 2 L to maintain saturations.  Problems Addressed: Aspiration pneumonitis [facility]): acute illness or injury that poses a threat to life or bodily functions  Amount and/or Complexity of Data Reviewed Labs: ordered. Radiology: ordered.  Risk Decision regarding hospitalization.     ### All radiography studies, electrocardiograms, and laboratory data were personally reviewed by me and incorporated into my medical decision making. Impression   1. Aspiration pneumonitis [facility])      Note: Dragon medical dictation software was used in the creation of this note.     Arminda Landmark, MD 01/10/24 1759    Tegeler, [name] Sia, MD 01/13/24 [phone]

## 2024-01-11 ENCOUNTER — Observation Stay (HOSPITAL_COMMUNITY)

## 2024-01-11 DIAGNOSIS — I130 Hypertensive heart and chronic kidney disease with heart failure and stage 1 through stage 4 chronic kidney disease, or unspecified chronic kidney disease: Secondary | ICD-10-CM | POA: Diagnosis not present

## 2024-01-11 DIAGNOSIS — I959 Hypotension, unspecified: Secondary | ICD-10-CM | POA: Diagnosis not present

## 2024-01-11 DIAGNOSIS — F319 Bipolar disorder, unspecified: Secondary | ICD-10-CM | POA: Diagnosis not present

## 2024-01-11 DIAGNOSIS — E785 Hyperlipidemia, unspecified: Secondary | ICD-10-CM | POA: Diagnosis not present

## 2024-01-11 DIAGNOSIS — H5310 Unspecified subjective visual disturbances: Secondary | ICD-10-CM | POA: Diagnosis not present

## 2024-01-11 DIAGNOSIS — I2510 Atherosclerotic heart disease of native coronary artery without angina pectoris: Secondary | ICD-10-CM | POA: Diagnosis not present

## 2024-01-11 DIAGNOSIS — J90 Pleural effusion, not elsewhere classified: Secondary | ICD-10-CM | POA: Diagnosis not present

## 2024-01-11 DIAGNOSIS — H409 Unspecified glaucoma: Secondary | ICD-10-CM | POA: Diagnosis not present

## 2024-01-11 DIAGNOSIS — Z8673 Personal history of transient ischemic attack (TIA), and cerebral infarction without residual deficits: Secondary | ICD-10-CM | POA: Diagnosis not present

## 2024-01-11 DIAGNOSIS — H3530 Unspecified macular degeneration: Secondary | ICD-10-CM | POA: Diagnosis not present

## 2024-01-11 DIAGNOSIS — N179 Acute kidney failure, unspecified: Secondary | ICD-10-CM | POA: Diagnosis not present

## 2024-01-11 DIAGNOSIS — H401131 Primary open-angle glaucoma, bilateral, mild stage: Secondary | ICD-10-CM | POA: Diagnosis not present

## 2024-01-11 DIAGNOSIS — I5032 Chronic diastolic (congestive) heart failure: Secondary | ICD-10-CM | POA: Diagnosis not present

## 2024-01-11 DIAGNOSIS — J690 Pneumonitis due to inhalation of food and vomit: Secondary | ICD-10-CM | POA: Diagnosis not present

## 2024-01-11 DIAGNOSIS — J9601 Acute respiratory failure with hypoxia: Secondary | ICD-10-CM | POA: Diagnosis not present

## 2024-01-11 DIAGNOSIS — R0989 Other specified symptoms and signs involving the circulatory and respiratory systems: Secondary | ICD-10-CM | POA: Diagnosis present

## 2024-01-11 DIAGNOSIS — F411 Generalized anxiety disorder: Secondary | ICD-10-CM | POA: Diagnosis not present

## 2024-01-11 DIAGNOSIS — K219 Gastro-esophageal reflux disease without esophagitis: Secondary | ICD-10-CM | POA: Diagnosis not present

## 2024-01-11 DIAGNOSIS — N182 Chronic kidney disease, stage 2 (mild): Secondary | ICD-10-CM | POA: Diagnosis not present

## 2024-01-11 DIAGNOSIS — Z79899 Other long term (current) drug therapy: Secondary | ICD-10-CM | POA: Diagnosis not present

## 2024-01-11 DIAGNOSIS — Z91030 Bee allergy status: Secondary | ICD-10-CM | POA: Diagnosis not present

## 2024-01-11 DIAGNOSIS — Z7902 Long term (current) use of antithrombotics/antiplatelets: Secondary | ICD-10-CM | POA: Diagnosis not present

## 2024-01-11 DIAGNOSIS — Z91013 Allergy to seafood: Secondary | ICD-10-CM | POA: Diagnosis not present

## 2024-01-11 DIAGNOSIS — J9811 Atelectasis: Secondary | ICD-10-CM | POA: Diagnosis not present

## 2024-01-11 LAB — COMPREHENSIVE METABOLIC PANEL WITH GFR
ALT: 13 U/L (ref 0–44)
AST: 17 U/L (ref 15–41)
Albumin: 2.9 g/dL — ABNORMAL LOW (ref 3.5–5.0)
Alkaline Phosphatase: 46 U/L (ref 38–126)
Anion gap: 7 (ref 5–15)
BUN: 35 mg/dL — ABNORMAL HIGH (ref 8–23)
CO2: 25 mmol/L (ref 22–32)
Calcium: 8.4 mg/dL — ABNORMAL LOW (ref 8.9–10.3)
Chloride: 110 mmol/L (ref 98–111)
Creatinine, Ser: 1.33 mg/dL — ABNORMAL HIGH (ref 0.61–1.24)
GFR, Estimated: 51 mL/min — ABNORMAL LOW (ref >60)
Glucose, Bld: 125 mg/dL — ABNORMAL HIGH (ref 70–99)
Potassium: 4 mmol/L (ref 3.5–5.1)
Sodium: 142 mmol/L (ref 135–145)
Total Bilirubin: 0.8 mg/dL (ref 0.0–1.2)
Total Protein: 6.4 g/dL — ABNORMAL LOW (ref 6.5–8.1)

## 2024-01-11 LAB — CBC
HCT: 34.4 % — ABNORMAL LOW (ref 39.0–52.0)
Hemoglobin: 11.1 g/dL — ABNORMAL LOW (ref 13.0–17.0)
MCH: 30.7 pg (ref 26.0–34.0)
MCHC: 32.3 g/dL (ref 30.0–36.0)
MCV: 95.3 fL (ref 80.0–100.0)
Platelets: 233 10*3/uL (ref 150–400)
RBC: 3.61 MIL/uL — ABNORMAL LOW (ref 4.22–5.81)
RDW: 13.7 % (ref 11.5–15.5)
WBC: 17.3 10*3/uL — ABNORMAL HIGH (ref 4.0–10.5)
nRBC: 0 % (ref 0.0–0.2)

## 2024-01-11 MED ORDER — SODIUM CHLORIDE 0.9 % IV SOLN: INTRAVENOUS | Status: DC

## 2024-01-11 MED ORDER — MENTHOL 3 MG MT LOZG: 1.0000 | LOZENGE | OROMUCOSAL | Status: DC | PRN

## 2024-01-11 MED ORDER — GUAIFENESIN ER 600 MG PO TB12
600.0000 mg | ORAL_TABLET | Freq: Two times a day (BID) | ORAL | Status: DC
  Administered 2024-01-11 – 2024-01-14 (×6): 600 mg via ORAL
  Filled 2024-01-11 (×6): qty 1

## 2024-01-11 MED ORDER — SODIUM CHLORIDE 0.9 % IV SOLN
3.0000 g | Freq: Four times a day (QID) | INTRAVENOUS | Status: DC
  Administered 2024-01-11 – 2024-01-13 (×6): 3 g via INTRAVENOUS
  Filled 2024-01-11 (×7): qty 8

## 2024-01-11 MED ORDER — BENZONATATE 100 MG PO CAPS
100.0000 mg | ORAL_CAPSULE | Freq: Three times a day (TID) | ORAL | Status: DC
  Administered 2024-01-11 – 2024-01-14 (×8): 100 mg via ORAL
  Filled 2024-01-11 (×8): qty 1

## 2024-01-11 MED ORDER — BENZONATATE 100 MG PO CAPS: 100.0000 mg | ORAL_CAPSULE | Freq: Three times a day (TID) | ORAL | Status: DC | PRN

## 2024-01-11 NOTE — Plan of Care (Signed)

## 2024-01-11 NOTE — Care Management Obs Status (Signed)
 MEDICARE OBSERVATION STATUS NOTIFICATION   Patient Details  Name: Clarence Myers MRN: [id number] Date of Birth: 09/06/1933   Medicare Observation Status Notification Given:  Yes    Kendyn Zaman G., RN 01/11/2024, 8:36 AM

## 2024-01-11 NOTE — Progress Notes (Signed)
 Modified Barium Swallow Study  Patient Details  Name: Clarence Myers MRN: [id number] Date of Birth: 11/29/1933  Today's Date: 01/11/2024  Modified Barium Swallow completed.  Full report located under Chart Review in the Imaging Section.  History of Present Illness Clarence Myers is a 89 y.o. male presenting after aspiration event.     Patient was reportedly drinking water earlier today and had a choking sensation and then coughed.  He did not have any food at this time.  No fever no shortness of breath.  He has had issues with aspiration/choking sensation in the past. MBS in 2023 after pontine stroke show "Orally pt exhibited premature spill with thin into laryngeal vestibule. Mastication of solid and oral control and transit with thicker textures was adequate. Laryngeal penetration to the vocal cords before the swallow present with thin and once aspirated before the swallow. A compensatory strategy of volitional breath hold before the swallow was effective however he was unable to consistently perform without constant cueing. Chin tuck was ineffective to protect airway. There was minimal residue throughout in valleculae. Swallows with nectar thick were coordinated and safe. Recommend pt's liquids thickened to nectar consistency and SLP at SNF work with pt on performing breath hold strategy prior to swallow for possible upgrade to thin at bedside." with medical history significant of hypertension, hyperlipidemia, CVA, GERD, CAD, CKD 2, chronic diastolic CHF, anxiety   Clinical Impression Pt demonstrates no aspiration on this study, swallow function challeneged extensively with no signfiicant dysphagia observed. Pt initiates swallow sometimes as deep as the pyriform sinues. Pt did best with an oral hold, but when drinking consecutively, at worst, had mild premautre spillage leading to high transient penetration (PAS 2). Esophageal sweep with solids and liquids/pill showed no stasis. Pt recommended  to continue a regular diet and thin liquids with basic aspiraiton precations - upright posture, stay attentive to swallow, small sips if possible. Relayed to daughter. Will f/u to observe pt with a meal. Factors that may increase risk of adverse event in presence of aspiration Roderick Civatte & Jessy Morocco 2021):    Swallow Evaluation Recommendations Recommendations: PO diet PO Diet Recommendation: Regular;Thin liquids (Level 0) Liquid Administration via: Cup;Straw Medication Administration: Whole meds with liquid Supervision: Patient able to self-feed Postural changes: Position pt fully upright for meals Oral care recommendations: Oral care BID (2x/day)      Tanika Bracco, Hardin Leys 01/11/2024,1:35 PM

## 2024-01-11 NOTE — Progress Notes (Signed)
 Clarence Myers ZOX:[id number]  DOB: [birth year]  DOA: 01/10/2024  PCP: Clarence Landau, MD  01/11/2024,7:18 AM  LOS: 0 days    Code Status: Full code   from: Home current Dispo:    89 year old male HTN HLD CAD with angioplasty 1992 CKD 2 CVA X2 with recent acute ischemic stroke 09/2023 Chronic aortic enlargement with atrial stenosis-not a candidate for further management given lack of functional capacity no symptoms Generalized anxiety disorder  Patient was apparently drinking water when he choked did not have any food scheduled --only water--seems this to happen consistently over the past several months and he presents with the symptoms whenever he has reported stroke according to daughter Lives with wife at home gets assistance and usually uses a walker Sodium 142 potassium 3.9 BUN/creatinine 29/1.1 is normal in wBC 16.7 platelet 266, hemoglobin 13.8 1 view CXR lung base opacity seen with small right pleural effusion atelectasis versus infiltrate Antibiotics were considered but held SLP was recommended   Plan  Probable aspiration pneumonia Would start Unasyn  given constellation of elevated white count 17, crackles on the right side and continue IVF 75 cc/H Hold diet until can be evaluated by SLP-if not seen today may start with most restrictive diet and thickened liquids until can be seen with strict supervision and sitting up at 45 degrees  CAD angioplasty 1992 Resumed Plavix  75 no longer on aspirin , resume Crestor  20 daily Continue metoprolol  12.5 twice daily  Multiple strokes recent ischemic stroke 09/2023 Meds as above  Bipolar/depression Continue sertraline  50 at bedtime in the next 24 to 48 hours, can use Remeron  at bedtime for sleep  Chronic aortic enlargement Outpatient follow-up  CKD 2 Follow trends of creatinine  DVT prophylaxis: Lovenox   Status is: Observation The patient will require care spanning > 2 midnights and should be moved to inpatient  because:   Need to de-escalate antibiotics going forward and decide based on therapy what to do Next discussed at the bedside with daughter Clarence Myers phone #[phone]   Subjective: Sleepy but arousable no distress Some hypotension earlier in shift which was probably an outlier Is hungry and thirsty but awaiting speech eval No chest pain  Objective + exam Vitals:   01/10/24 1800 01/10/24 1924 01/10/24 2348 01/11/24 0502  BP: (!) 143/87 (!) 141/90 104/65 (!) 103/58  Pulse: (!) 106 (!) 110 85 64  Resp:  18 17 17   Temp:  99.2 F (37.3 C) 98.7 F (37.1 C) 98.4 F (36.9 C)  TempSrc:      SpO2: 97% 95% 96% 97%  Weight:      Height:       Filed Weights   01/10/24 1543  Weight: 66.8 kg    Examination: EOMI NCAT no focal deficit no icterus no pallor Chest is clear no wheeze rales rhonchi ROM is intact power is 5/5 but limited on the right side to some degree of the upper extremity especially has past-pointing on the right side Pupils are equally reactive Abdo soft no rebound No lower extremity edema  Data Reviewed: reviewed   CBC    Component Value Date/Time   WBC 16.7 (H) 01/10/2024 1630   RBC 4.43 01/10/2024 1630   HGB 13.6 01/10/2024 1640   HCT 40.0 01/10/2024 1640   PLT 266 01/10/2024 1630   MCV 93.7 01/10/2024 1630   MCH 31.2 01/10/2024 1630   MCHC 33.3 01/10/2024 1630   RDW 13.2 01/10/2024 1630   LYMPHSABS 1.5 01/10/2024 1630   MONOABS 1.0  01/10/2024 1630   EOSABS 0.1 01/10/2024 1630   BASOSABS 0.1 01/10/2024 1630      Latest Ref Rng & Units 01/10/2024    4:40 PM 01/10/2024    4:30 PM 10/08/2023    1:05 AM  CMP  Glucose 70 - 99 mg/dL  409  811   BUN 8 - 23 mg/dL  29  34   Creatinine 9.14 - 1.24 mg/dL  7.82  9.56   Sodium 213 - 145 mmol/L 143  142  138   Potassium 3.5 - 5.1 mmol/L 3.8  3.9  3.6   Chloride 98 - 111 mmol/L  107  104   CO2 22 - 32 mmol/L  26  22   Calcium  8.9 - 10.3 mg/dL  9.4  8.6   Total Protein 6.5 - 8.1 g/dL  7.9  6.5    Total Bilirubin 0.0 - 1.2 mg/dL  0.8  0.9   Alkaline Phos 38 - 126 U/L  64  47   AST 15 - 41 U/L  24  16   ALT 0 - 44 U/L  14  14     Scheduled Meds:  clopidogrel   75 mg Oral Daily   enoxaparin  (LOVENOX ) injection  40 mg Subcutaneous Q24H   metoprolol  tartrate  12.5 mg Oral BID   mirtazapine   15 mg Oral QHS   pantoprazole   40 mg Oral Daily   rosuvastatin   20 mg Oral QHS   sertraline   25 mg Oral QHS   sodium chloride  flush  3 mL Intravenous Q12H   Continuous Infusions:  sodium chloride  75 mL/hr at 01/11/24 0865    Time 46  Verlie Glisson, MD  [facility]

## 2024-01-11 NOTE — Evaluation (Signed)
 Clinical/Bedside Swallow Evaluation Patient Details  Name: Clarence Myers MRN: [id number] Date of Birth: 14-May-1934  Today's Date: 01/11/2024 Time: SLP Start Time (ACUTE ONLY): 1020 SLP Stop Time (ACUTE ONLY): 1040 SLP Time Calculation (min) (ACUTE ONLY): 20 min  Past Medical History:  Past Medical History:  Diagnosis Date   Acute ischemic stroke (HCC) 12/30/2021   CAD (coronary artery disease)    CVA (cerebral vascular accident) (HCC) 09/29/2021   Hyperlipidemia    Hypokalemia 09/29/2021   Low back strain    Shoulder injury    Fell and injured Right shoulder   Stroke [facility])    Past Surgical History:  Past Surgical History:  Procedure Laterality Date   ANGIOPLASTY     1992-LAD   CARDIAC CATHETERIZATION     HPI:  Clarence Myers is a 89 y.o. male presenting after aspiration event.     Patient was reportedly drinking water earlier today and had a choking sensation and then coughed.  He did not have any food at this time.  No fever no shortness of breath.  He has had issues with aspiration/choking sensation in the past. MBS in 2023 after pontine stroke show "Orally pt exhibited premature spill with thin into laryngeal vestibule. Mastication of solid and oral control and transit with thicker textures was adequate. Laryngeal penetration to the vocal cords before the swallow present with thin and once aspirated before the swallow. A compensatory strategy of volitional breath hold before the swallow was effective however he was unable to consistently perform without constant cueing. Chin tuck was ineffective to protect airway. There was minimal residue throughout in valleculae. Swallows with nectar thick were coordinated and safe. Recommend pt's liquids thickened to nectar consistency and SLP at SNF work with pt on performing breath hold strategy prior to swallow for possible upgrade to thin at bedside." with medical history significant of hypertension, hyperlipidemia, CVA, GERD, CAD, CKD 2,  chronic diastolic CHF, anxiety    Assessment / Plan / Recommendation  Clinical Impression  Pt presents with prolonged hard coughing after drinking thin water and sips of nectar thick juice. Pt also reports that he was vomiting and regurgitating yesterday.  Impossible to determine subjectively whether pt is aspirating or if coughing is indicative of an esophageal dyspahgia.  Daughter reports less severe delayed coughing observed after meals and coughing while in bed at night. Will f/u with MBS for instrumental assessment. In the meantime pt may have ice chips, small sips of nectar thick juice and bites of puree. SLP Visit Diagnosis: Dysphagia, unspecified (R13.10)    Aspiration Risk  Moderate aspiration risk    Diet Recommendation Ice chips PRN after oral care;Nectar-thick liquid;Dysphagia 1 (Puree)    Liquid Administration via: Straw;Cup Medication Administration: Whole meds with puree Supervision: Patient able to self feed Compensations: Slow rate;Small sips/bites Postural Changes: Seated upright at 90 degrees    Other  Recommendations      Recommendations for follow up therapy are one component of a multi-disciplinary discharge planning process, led by the attending physician.  Recommendations may be updated based on patient status, additional functional criteria and insurance authorization.  Follow up Recommendations        Assistance Recommended at Discharge    Functional Status Assessment    Frequency and Duration            Prognosis        Swallow Study   General HPI: Clarence Myers is a 89 y.o. male presenting after aspiration event.  Patient was reportedly drinking water earlier today and had a choking sensation and then coughed.  He did not have any food at this time.  No fever no shortness of breath.  He has had issues with aspiration/choking sensation in the past. MBS in 2023 after pontine stroke show "Orally pt exhibited premature spill with thin into  laryngeal vestibule. Mastication of solid and oral control and transit with thicker textures was adequate. Laryngeal penetration to the vocal cords before the swallow present with thin and once aspirated before the swallow. A compensatory strategy of volitional breath hold before the swallow was effective however he was unable to consistently perform without constant cueing. Chin tuck was ineffective to protect airway. There was minimal residue throughout in valleculae. Swallows with nectar thick were coordinated and safe. Recommend pt's liquids thickened to nectar consistency and SLP at SNF work with pt on performing breath hold strategy prior to swallow for possible upgrade to thin at bedside." with medical history significant of hypertension, hyperlipidemia, CVA, GERD, CAD, CKD 2, chronic diastolic CHF, anxiety Type of Study: Bedside Swallow Evaluation Previous Swallow Assessment: see HPI Diet Prior to this Study: NPO Temperature Spikes Noted: No Respiratory Status: Room air History of Recent Intubation: No Behavior/Cognition: Alert;Cooperative;Pleasant mood Oral Care Completed by SLP: No Oral Cavity - Dentition: Adequate natural dentition Vision: Functional for self-feeding Self-Feeding Abilities: Able to feed self Patient Positioning: Upright in bed Baseline Vocal Quality: Normal Volitional Cough: Strong Volitional Swallow: Able to elicit    Oral/Motor/Sensory Function Overall Oral Motor/Sensory Function: Within functional limits   Ice Chips Ice chips: Not tested   Thin Liquid Thin Liquid: Impaired Presentation: Straw Pharyngeal  Phase Impairments: Cough - Delayed    Nectar Thick Nectar Thick Liquid: Impaired Presentation: Straw Pharyngeal Phase Impairments: Cough - Delayed   Honey Thick Honey Thick Liquid: Not tested   Puree Puree: Not tested   Solid     Solid: Not tested      Rion Schnitzer, Hardin Leys 01/11/2024,10:52 AM

## 2024-01-12 DIAGNOSIS — J9601 Acute respiratory failure with hypoxia: Secondary | ICD-10-CM | POA: Diagnosis not present

## 2024-01-12 LAB — CBC WITH DIFFERENTIAL/PLATELET
Abs Immature Granulocytes: 0.04 10*3/uL (ref 0.00–0.07)
Basophils Absolute: 0 10*3/uL (ref 0.0–0.1)
Basophils Relative: 0 %
Eosinophils Absolute: 0.4 10*3/uL (ref 0.0–0.5)
Eosinophils Relative: 4 %
HCT: 32.4 % — ABNORMAL LOW (ref 39.0–52.0)
Hemoglobin: 10.4 g/dL — ABNORMAL LOW (ref 13.0–17.0)
Immature Granulocytes: 0 %
Lymphocytes Relative: 21 %
Lymphs Abs: 2.4 10*3/uL (ref 0.7–4.0)
MCH: 30.7 pg (ref 26.0–34.0)
MCHC: 32.1 g/dL (ref 30.0–36.0)
MCV: 95.6 fL (ref 80.0–100.0)
Monocytes Absolute: 0.9 10*3/uL (ref 0.1–1.0)
Monocytes Relative: 8 %
Neutro Abs: 7.6 10*3/uL (ref 1.7–7.7)
Neutrophils Relative %: 67 %
Platelets: 195 10*3/uL (ref 150–400)
RBC: 3.39 MIL/uL — ABNORMAL LOW (ref 4.22–5.81)
RDW: 13.7 % (ref 11.5–15.5)
WBC: 11.3 10*3/uL — ABNORMAL HIGH (ref 4.0–10.5)
nRBC: 0 % (ref 0.0–0.2)

## 2024-01-12 LAB — BASIC METABOLIC PANEL WITH GFR
Anion gap: 6 (ref 5–15)
BUN: 29 mg/dL — ABNORMAL HIGH (ref 8–23)
CO2: 23 mmol/L (ref 22–32)
Calcium: 8.2 mg/dL — ABNORMAL LOW (ref 8.9–10.3)
Chloride: 110 mmol/L (ref 98–111)
Creatinine, Ser: 1.23 mg/dL (ref 0.61–1.24)
GFR, Estimated: 56 mL/min — ABNORMAL LOW (ref >60)
Glucose, Bld: 91 mg/dL (ref 70–99)
Potassium: 3.4 mmol/L — ABNORMAL LOW (ref 3.5–5.1)
Sodium: 139 mmol/L (ref 135–145)

## 2024-01-12 MED ORDER — CYCLOSPORINE 0.05 % OP EMUL
1.0000 [drp] | Freq: Two times a day (BID) | OPHTHALMIC | Status: DC
  Administered 2024-01-12 – 2024-01-14 (×5): 1 [drp] via OPHTHALMIC
  Filled 2024-01-12 (×5): qty 30

## 2024-01-12 MED ORDER — LATANOPROST 0.005 % OP SOLN
1.0000 [drp] | Freq: Every day | OPHTHALMIC | Status: DC
  Administered 2024-01-12 – 2024-01-13 (×2): 1 [drp] via OPHTHALMIC
  Filled 2024-01-12: qty 2.5

## 2024-01-12 NOTE — Evaluation (Signed)
 Physical Therapy Evaluation Patient Details Name: Clarence Myers MRN: [id number] DOB: 1934/05/04 Today's Date: 01/12/2024  History of Present Illness  89 y.o. male who presents with acute disequilibrium on 10/07/23 and was found to have a small ischemic cerebellar stroke. PMH significant for essential hypertension, hyperlipidemia, history of CAD angioplasty 1992, CKD stage II, CVA x 2, chronic aortic enlargement, aortic stenosis-asymptomatic, GERD and vitamin D deficiency.  Clinical Impression  Pt admitted with above diagnosis and presents to PT with functional limitations due to deficits listed below (See PT problem list). Pt needs skilled PT to maximize independence and safety. Pt with a decr in his mobility with his acute illness. Expect he will make steady progress back to baseline and has good family support. Recommend resume HHPT.           If plan is discharge home, recommend the following: A little help with walking and/or transfers;A little help with bathing/dressing/bathroom;Assistance with cooking/housework;Assist for transportation;Help with stairs or ramp for entrance   Can travel by private vehicle        Equipment Recommendations None recommended by PT  Recommendations for Other Services       Functional Status Assessment Patient has had a recent decline in their functional status and demonstrates the ability to make significant improvements in function in a reasonable and predictable amount of time.     Precautions / Restrictions Precautions Precautions: Fall;Other (comment) (low vision deficits)      Mobility  Bed Mobility Overal bed mobility: Needs Assistance Bed Mobility: Supine to Sit, Sit to Supine     Supine to sit: Mod assist, HOB elevated     General bed mobility comments: Assist to elevate trunk into sitting with lean to the rt.    Transfers Overall transfer level: Needs assistance Equipment used: Rolling walker (2 wheels) Transfers: Sit  to/from Stand Sit to Stand: Mod assist           General transfer comment: Assist to power up and for balance. Pt with rt lean    Ambulation/Gait Ambulation/Gait assistance: Min assist Gait Distance (Feet): 15 Feet Assistive device: Rolling walker (2 wheels) Gait Pattern/deviations: Step-to pattern, Decreased step length - right, Decreased step length - left, Shuffle, Trunk flexed Gait velocity: decr Gait velocity interpretation: <1.31 ft/sec, indicative of household ambulator   General Gait Details: Assist for balance and support as well as guidance due to poor vision. Verbal cues to incr step length  Stairs            Wheelchair Mobility     Tilt Bed    Modified Rankin (Stroke Patients Only)       Balance Overall balance assessment: Needs assistance Sitting-balance support: Feet supported, Bilateral upper extremity supported Sitting balance-Leahy Scale: Poor Sitting balance - Comments: Sitting EOB required min assist and UE support due to rt lean   Standing balance support: Bilateral upper extremity supported Standing balance-Leahy Scale: Poor Standing balance comment: min assist and RW                             Pertinent Vitals/Pain Pain Assessment Pain Assessment: No/denies pain    Home Living Family/patient expects to be discharged to:: Private residence Living Arrangements: Spouse/significant other Available Help at Discharge: Family;Personal care attendant Type of Home: House Home Access: Stairs to enter Entrance Stairs-Rails: None Entrance Stairs-Number of Steps: 2   Home Layout: Able to live on main level with bedroom/bathroom Home Equipment: BSC/3in1;Tub  bench;Rolling Walker (2 wheels);Cane - single point;Grab bars - tub/shower;Hospital bed;Wheelchair - manual      Prior Function Prior Level of Function : Independent/Modified Independent             Mobility Comments: State he is able to ambulate and complete his self  care independently. Has 24/7 PCA.       Extremity/Trunk Assessment   Upper Extremity Assessment Upper Extremity Assessment: Defer to OT evaluation    Lower Extremity Assessment Lower Extremity Assessment: Generalized weakness    Cervical / Trunk Assessment Cervical / Trunk Assessment: Kyphotic  Communication   Communication Communication: No apparent difficulties    Cognition Arousal: Alert Behavior During Therapy: WFL for tasks assessed/performed   PT - Cognitive impairments: Problem solving                       PT - Cognition Comments: slow to process Following commands: Intact       Cueing       General Comments General comments (skin integrity, edema, etc.): several steps forward/backward    Exercises     Assessment/Plan    PT Assessment Patient needs continued PT services  PT Problem List Decreased strength;Decreased balance;Decreased mobility;Decreased activity tolerance       PT Treatment Interventions DME instruction;Gait training;Functional mobility training;Therapeutic activities;Therapeutic exercise;Balance training;Patient/family education    PT Goals (Current goals can be found in the Care Plan section)  Acute Rehab PT Goals Patient Stated Goal: return home PT Goal Formulation: With patient Time For Goal Achievement: 01/26/24 Potential to Achieve Goals: Good    Frequency Min 2X/week     Co-evaluation               AM-PAC PT "6 Clicks" Mobility  Outcome Measure Help needed turning from your back to your side while in a flat bed without using bedrails?: A Little Help needed moving from lying on your back to sitting on the side of a flat bed without using bedrails?: A Lot Help needed moving to and from a bed to a chair (including a wheelchair)?: A Lot Help needed standing up from a chair using your arms (e.g., wheelchair or bedside chair)?: A Lot Help needed to walk in hospital room?: A Little Help needed climbing 3-5  steps with a railing? : Total 6 Click Score: 13    End of Session Equipment Utilized During Treatment: Gait belt Activity Tolerance: Patient tolerated treatment well Patient left: in chair;with call bell/phone within reach;with chair alarm set;with family/visitor present   PT Visit Diagnosis: Unsteadiness on feet (R26.81);Other abnormalities of gait and mobility (R26.89);Muscle weakness (generalized) (M62.81)    Time: 4098-1191 PT Time Calculation (min) (ACUTE ONLY): 32 min   Charges:   PT Evaluation $PT Eval Moderate Complexity: 1 Mod PT Treatments $Gait Training: 8-22 mins PT General Charges $$ ACUTE PT VISIT: 1 Visit         [facility] PT Acute Rehabilitation Services Office [phone]   [facility] 01/12/2024, 6:02 PM

## 2024-01-12 NOTE — Progress Notes (Signed)
 [facility] ROUNDING NOTE MAXIMILIAN TALLO ZOX:[id number]  DOB: October 29, 1933  DOA: 01/10/2024  PCP: Glena Landau, MD  01/12/2024,8:23 AM  LOS: 1 day    Code Status: Full code   from: Home current Dispo:    89 year old male HTN HLD CAD with angioplasty 1992 CKD 2 CVA X2 with recent acute ischemic stroke 09/2023 Chronic aortic enlargement with atrial stenosis-not a candidate for further management given lack of functional capacity no symptoms Generalized anxiety disorder  Patient was apparently drinking water when he choked did not have any food scheduled --only water--seems this to happen consistently over the past several months and he presents with the symptoms whenever he has reported stroke according to daughter Lives with wife at home gets assistance and usually uses a walker Sodium 142 potassium 3.9 BUN/creatinine 29/1.1 is normal in wBC 16.7 platelet 266, hemoglobin 13.8 1 view CXR lung base opacity seen with small right pleural effusion atelectasis versus infiltrate Antibiotics were considered but held SLP was recommended   Plan  Probable aspiration pneumonia Given elevated white count and clinical scenario Unasyn  started on 5/24 SLP is cleared patient for regular diet with strict supervision and thin liquids White count has responded appropriately so we will keep on IV antibiotics today transition to orals probably this evening and reassess  Monocular decreased vision on left side Patient has a history of glaucoma I have resumed home Restasis  drops 1 drop to both eyes twice daily--he is not on a steroid or on a beta-blocker drop and has not been for a while Discussed with ophthalmology Dr. Mccuen who will see the patient in consult  CAD angioplasty 1992 Resumed Plavix  75 --completed requisite 3 months of aspirin , resume Crestor  20 daily Patient had some mild hypotension during hospital stay so metoprolol  12.5 was discontinued--- consolidate or/resume in the next 24 hours  Multiple  strokes recent ischemic stroke 09/2023 Meds as above  Bipolar/depression Continue sertraline  25 at bedtime, Remeron  15 at bedtime  Chronic aortic enlargement Outpatient follow-up  CKD 2 Follow trends of creatinine  DVT prophylaxis: Lovenox   Status is: Observation The patient will require care spanning > 2 midnights and should be moved to inpatient because:   Need to de-escalate antibiotics going forward and decide based on therapy what to do Next discussed at the bedside with daughter Glena Landau phone #[phone]   Subjective:  Some monocular visual issues overnight-seems to be more inability to see in his inferior nasal aspects of both eyes he was having issues seeing the TV last night Visual acuity at around 5 to 6 feet is reasonable He has no focal motor findings and seems more coherent He is eating okay  Objective + exam Vitals:   01/12/24 0000 01/12/24 0042 01/12/24 0500 01/12/24 0820  BP:   (!) 124/50 122/65  Pulse:  72 69 74  Resp:  16 17 17   Temp: 99.9 F (37.7 C)  99.7 F (37.6 C) 99.6 F (37.6 C)  TempSrc: Oral  Axillary Oral  SpO2:  93% 94% 94%  Weight:      Height:       Filed Weights   01/10/24 1543  Weight: 66.8 kg    Examination:  Funduscopy attempted on left side cup Looks reasonable I was not able to get a good view on the right side He has some wheezes and rales more so on the right than the left His neurologic exam is not really changed he is moving 4 limbs equally although he has limitation on the  right side as he did prior S1-S2 no murmur ROM is intact No lower extremity edema   Data Reviewed: reviewed   CBC    Component Value Date/Time   WBC 11.3 (H) 01/12/2024 0738   RBC 3.39 (L) 01/12/2024 0738   HGB 10.4 (L) 01/12/2024 0738   HCT 32.4 (L) 01/12/2024 0738   PLT 195 01/12/2024 0738   MCV 95.6 01/12/2024 0738   MCH 30.7 01/12/2024 0738   MCHC 32.1 01/12/2024 0738   RDW 13.7 01/12/2024 0738   LYMPHSABS 2.4 01/12/2024  0738   MONOABS 0.9 01/12/2024 0738   EOSABS 0.4 01/12/2024 0738   BASOSABS 0.0 01/12/2024 0738      Latest Ref Rng & Units 01/11/2024    7:31 AM 01/10/2024    4:40 PM 01/10/2024    4:30 PM  CMP  Glucose 70 - 99 mg/dL 782   956   BUN 8 - 23 mg/dL 35   29   Creatinine 2.13 - 1.24 mg/dL 0.86   5.78   Sodium 469 - 145 mmol/L 142  143  142   Potassium 3.5 - 5.1 mmol/L 4.0  3.8  3.9   Chloride 98 - 111 mmol/L 110   107   CO2 22 - 32 mmol/L 25   26   Calcium  8.9 - 10.3 mg/dL 8.4   9.4   Total Protein 6.5 - 8.1 g/dL 6.4   7.9   Total Bilirubin 0.0 - 1.2 mg/dL 0.8   0.8   Alkaline Phos 38 - 126 U/L 46   64   AST 15 - 41 U/L 17   24   ALT 0 - 44 U/L 13   14     Scheduled Meds:  benzonatate  100 mg Oral TID   clopidogrel   75 mg Oral Daily   cycloSPORINE   1 drop Both Eyes BID   enoxaparin  (LOVENOX ) injection  40 mg Subcutaneous Q24H   guaiFENesin  600 mg Oral BID   mirtazapine   15 mg Oral QHS   pantoprazole   40 mg Oral Daily   rosuvastatin   20 mg Oral QHS   sertraline   25 mg Oral QHS   sodium chloride  flush  3 mL Intravenous Q12H   Continuous Infusions:  sodium chloride  75 mL/hr at 01/12/24 0427   ampicillin -sulbactam (UNASYN ) IV 3 g (01/12/24 0210)    Time 46  Verlie Glisson, MD  [facility]

## 2024-01-12 NOTE — Progress Notes (Signed)
 Pt's daughter this morning stated pt c/o he is seeing dark spots in room. Daughter stated pt has a history of glaucoma. On assessment, pt is able to read 3 fingers on R/eye with L/eye closed. Pt unable to read fingers correctly on L/eye with R/eye closed. Pt uses glasses. Daughter is concerned and will like pt to be eval. Notified Verlie Glisson, MD

## 2024-01-12 NOTE — Plan of Care (Signed)

## 2024-01-12 NOTE — Consult Note (Signed)
 Reason for consult:  Vision change per primary team  HPI: Clarence Myers is an 89 y.o. male.  He was admitted to the hospital for 2 days ago for aspiration pneumonia.  Last evening, he felt like the "vision was darker than usual."  He is not sure if it was one eyes or both eyes.  He reports that his vision is back to baseline.  No eye pain.  No headache.  Past Ocular history:  s/p cataract surgery OU with longstanding distorted pupils, Glaucoma for which he sees Dr. Josefa Ni (glaucoma specialist at [facility])  He has had laser surgery for glaucoma 6 month ago OU because "the glaucoma drops did not bring the pressure down."  Family Ocular history:  mother had glaucoma  Past Medical History:  Diagnosis Date   Acute ischemic stroke (HCC) 12/30/2021   CAD (coronary artery disease)    CVA (cerebral vascular accident) (HCC) 09/29/2021   Hyperlipidemia    Hypokalemia 09/29/2021   Low back strain    Shoulder injury    Fell and injured Right shoulder   Stroke [facility])    Past Surgical History:  Procedure Laterality Date   ANGIOPLASTY     1992-LAD   CARDIAC CATHETERIZATION     History reviewed. No pertinent family history. Current Facility-Administered Medications  Medication Dose Route Frequency Provider Last Rate Last Admin   0.9 %  sodium chloride  infusion   Intravenous Continuous Samtani, Jai-Gurmukh, MD 75 mL/hr at 01/12/24 0427 New Bag at 01/12/24 0427   acetaminophen  (TYLENOL ) tablet 650 mg  650 mg Oral Q6H PRN Johnetta Nab, MD       Or   acetaminophen  (TYLENOL ) suppository 650 mg  650 mg Rectal Q6H PRN Johnetta Nab, MD       Ampicillin -Sulbactam (UNASYN ) 3 g in sodium chloride  0.9 % 100 mL IVPB  3 g Intravenous Q6H Weingarten, Rachael I, RPH 200 mL/hr at 01/12/24 0210 3 g at 01/12/24 0210   benzonatate (TESSALON) capsule 100 mg  100 mg Oral TID Sundil, Subrina, MD   100 mg at 01/11/24 2310   clopidogrel  (PLAVIX ) tablet 75 mg  75 mg Oral Daily Melvin, Alexander B, MD    75 mg at 01/11/24 0900   cycloSPORINE  (RESTASIS ) 0.05 % ophthalmic emulsion 1 drop  1 drop Both Eyes BID Samtani, Jai-Gurmukh, MD       enoxaparin  (LOVENOX ) injection 40 mg  40 mg Subcutaneous Q24H Melvin, Alexander B, MD   40 mg at 01/11/24 1700   guaiFENesin (MUCINEX) 12 hr tablet 600 mg  600 mg Oral BID Mansy, Jan A, MD   600 mg at 01/11/24 2310   menthol-cetylpyridinium (CEPACOL) lozenge 3 mg  1 lozenge Oral PRN Sundil, Subrina, MD       mirtazapine  (REMERON ) tablet 15 mg  15 mg Oral QHS Melvin, Alexander B, MD   15 mg at 01/11/24 2117   pantoprazole  (PROTONIX ) EC tablet 40 mg  40 mg Oral Daily Melvin, Alexander B, MD   40 mg at 01/11/24 0901   polyethylene glycol (MIRALAX / GLYCOLAX) packet 17 g  17 g Oral Daily PRN Melvin, Alexander B, MD       rosuvastatin  (CRESTOR ) tablet 20 mg  20 mg Oral QHS Melvin, Alexander B, MD   20 mg at 01/11/24 2117   sertraline  (ZOLOFT ) tablet 25 mg  25 mg Oral QHS Melvin, Alexander B, MD   25 mg at 01/11/24 2117   sodium chloride  flush (NS) 0.9 % injection 3  mL  3 mL Intravenous Q12H Johnetta Nab, MD   3 mL at 01/11/24 2117   Allergies  Allergen Reactions   Bee Venom Anaphylaxis and Swelling   Blue Crab (Cavinectes Sapidus) Allergy Skin Test Hives, Swelling, Other (See Comments) and Cough   Shellfish-Derived Products Hives   Social History   Socioeconomic History   Marital status: Married    Spouse name: Not on file   Number of children: Not on file   Years of education: Not on file   Highest education level: Not on file  Occupational History   Not on file  Tobacco Use   Smoking status: Never   Smokeless tobacco: Never  Vaping Use   Vaping status: Never Used  Substance and Sexual Activity   Alcohol use: Never   Drug use: Never   Sexual activity: Not on file  Other Topics Concern   Not on file  Social History Narrative   Not on file   Social Drivers of Health   Financial Resource Strain: Not on file  Food Insecurity: No Food  Insecurity (01/10/2024)   Hunger Vital Sign    Worried About Running Out of Food in the Last Year: Never true    Ran Out of Food in the Last Year: Never true  Transportation Needs: No Transportation Needs (01/10/2024)   PRAPARE - Administrator, Civil Service (Medical): No    Lack of Transportation (Non-Medical): No  Physical Activity: Not on file  Stress: Not on file  Social Connections: Moderately Isolated (01/10/2024)   Social Connection and Isolation Panel [NHANES]    Frequency of Communication with Friends and Family: More than three times a week    Frequency of Social Gatherings with Friends and Family: More than three times a week    Attends Religious Services: Never    Database administrator or Organizations: No    Attends Banker Meetings: Never    Marital Status: Married  Catering manager Violence: Not At Risk (01/10/2024)   Humiliation, Afraid, Rape, and Kick questionnaire    Fear of Current or Ex-Partner: No    Emotionally Abused: No    Physically Abused: No    Sexually Abused: No    Review of systems: Review of Systems  Constitutional:  Positive for fever.  HENT:  Negative for hearing loss.   Eyes:  Negative for pain.  Respiratory:  Positive for cough and shortness of breath.   Cardiovascular:  Negative for chest pain.  Gastrointestinal:  Positive for vomiting.  Genitourinary: Negative.   Musculoskeletal:  Positive for falls.  Neurological:  Negative for dizziness and loss of consciousness.    Physical Exam:  Blood pressure 122/65, pulse 74, temperature 99.6 F (37.6 C), temperature source Oral, resp. rate 17, height 5\' 7"  (1.702 m), weight 66.8 kg, SpO2 94%.   VA cc (OTC rdrs):  OD: 20/400 OS: Hand Motion    Pupils:   OD surgically distorted, not reactive            [id number] distorted, not reactive  IOP (T pen)  OD: 23   OS 17   CVF: poor cooperation but grossly full OU  Motility:  OD full ductions  OS full  ductions  Balance/alignment:  Ortho by Susen Epstein   Bedside examination:                                 OD  External/adnexa: Normal                                      Lids/lashes:        Normal                                      Conjunctiva        White, quiet        Cornea:              Clear                  AC:                     Deep, quiet                                Iris:                     surgical pupil        Lens:                  PCIOL                                       OS                                       External/adnexa: Normal                                      Lids/lashes:        Normal                                      Conjunctiva        White, quiet        Cornea:              Clear                  AC:                     Deep, quiet                                Iris:                     Sugical pupil        Lens:                  Clear       Dilated fundus exam: (Neo 2.5; Myd 1%) (20 diopter exam)      OD Vitreous            Clear, quiet  Optic Disc:       Possibly cupped, perfused with good color and no edema                     Macula:             small PEDs                                           Vessels:           Normal caliber,distribution         Periphery:         Flat, attached                                      OS Vitreous            Clear, quiet                                Optic Disc:       Pale, cupped, no edma                      Macula:             PEDs                                           Vessels:           Normal caliber,distribution         Periphery:         Flat, attached        Labs/studies: Results for orders placed or performed during the hospital encounter of 01/10/24 (from the past 48 hours)  Comprehensive metabolic panel     Status: Abnormal   Collection Time: 01/10/24  4:30 PM  Result Value Ref Range   Sodium 142 135 - 145  mmol/L   Potassium 3.9 3.5 - 5.1 mmol/L   Chloride 107 98 - 111 mmol/L   CO2 26 22 - 32 mmol/L   Glucose, Bld 120 (H) 70 - 99 mg/dL    Comment: Glucose reference range applies only to samples taken after fasting for at least 8 hours.   BUN 29 (H) 8 - 23 mg/dL   Creatinine, Ser 1.61 0.61 - 1.24 mg/dL   Calcium  9.4 8.9 - 10.3 mg/dL   Total Protein 7.9 6.5 - 8.1 g/dL   Albumin 3.7 3.5 - 5.0 g/dL   AST 24 15 - 41 U/L   ALT 14 0 - 44 U/L   Alkaline Phosphatase 64 38 - 126 U/L   Total Bilirubin 0.8 0.0 - 1.2 mg/dL   GFR, Estimated >09 >60 mL/min    Comment: (NOTE) Calculated using the CKD-EPI Creatinine Equation (2021)    Anion gap 9 5 - 15    Comment: Performed at [facility], [address] [place], [place] [address]  CBC with Differential     Status: Abnormal   Collection Time: 01/10/24  4:30 PM  Result Value Ref Range   WBC 16.7 (H) 4.0 - 10.5 K/uL   RBC 4.43 4.22 -  5.81 MIL/uL   Hemoglobin 13.8 13.0 - 17.0 g/dL   HCT 16.1 09.6 - 04.5 %   MCV 93.7 80.0 - 100.0 fL   MCH 31.2 26.0 - 34.0 pg   MCHC 33.3 30.0 - 36.0 g/dL   RDW 40.9 81.1 - 91.4 %   Platelets 266 150 - 400 K/uL   nRBC 0.0 0.0 - 0.2 %   Neutrophils Relative % 84 %   Neutro Abs 14.0 (H) 1.7 - 7.7 K/uL   Lymphocytes Relative 9 %   Lymphs Abs 1.5 0.7 - 4.0 K/uL   Monocytes Relative 6 %   Monocytes Absolute 1.0 0.1 - 1.0 K/uL   Eosinophils Relative 0 %   Eosinophils Absolute 0.1 0.0 - 0.5 K/uL   Basophils Relative 0 %   Basophils Absolute 0.1 0.0 - 0.1 K/uL   Immature Granulocytes 1 %   Abs Immature Granulocytes 0.09 (H) 0.00 - 0.07 K/uL    Comment: Performed at [facility], [address] [place], [place] [address]  I-Stat venous blood gas, ED     Status: None   Collection Time: 01/10/24  4:40 PM  Result Value Ref Range   pH, Ven 7.357 7.25 - 7.43   pCO2, Ven 49.9 44 - 60 mmHg   pO2, Ven 40 32 - 45 mmHg   Bicarbonate 28.0 20.0 - 28.0 mmol/L   TCO2 29 22 - 32 mmol/L   O2 Saturation 71 %    Acid-Base Excess 2.0 0.0 - 2.0 mmol/L   Sodium 143 135 - 145 mmol/L   Potassium 3.8 3.5 - 5.1 mmol/L   Calcium , Ion 1.20 1.15 - 1.40 mmol/L   HCT 40.0 39.0 - 52.0 %   Hemoglobin 13.6 13.0 - 17.0 g/dL   Sample type VENOUS   Comprehensive metabolic panel     Status: Abnormal   Collection Time: 01/11/24  7:31 AM  Result Value Ref Range   Sodium 142 135 - 145 mmol/L   Potassium 4.0 3.5 - 5.1 mmol/L   Chloride 110 98 - 111 mmol/L   CO2 25 22 - 32 mmol/L   Glucose, Bld 125 (H) 70 - 99 mg/dL    Comment: Glucose reference range applies only to samples taken after fasting for at least 8 hours.   BUN 35 (H) 8 - 23 mg/dL   Creatinine, Ser 6.21 (H) 0.61 - 1.24 mg/dL   Calcium  8.4 (L) 8.9 - 10.3 mg/dL   Total Protein 6.4 (L) 6.5 - 8.1 g/dL   Albumin 2.9 (L) 3.5 - 5.0 g/dL   AST 17 15 - 41 U/L   ALT 13 0 - 44 U/L   Alkaline Phosphatase 46 38 - 126 U/L   Total Bilirubin 0.8 0.0 - 1.2 mg/dL   GFR, Estimated 51 (L) >60 mL/min    Comment: (NOTE) Calculated using the CKD-EPI Creatinine Equation (2021)    Anion gap 7 5 - 15    Comment: Performed at [facility], [address] [place], [place] [address]  CBC     Status: Abnormal   Collection Time: 01/11/24  7:31 AM  Result Value Ref Range   WBC 17.3 (H) 4.0 - 10.5 K/uL   RBC 3.61 (L) 4.22 - 5.81 MIL/uL   Hemoglobin 11.1 (L) 13.0 - 17.0 g/dL   HCT 78.4 (L) 69.6 - 29.5 %   MCV 95.3 80.0 - 100.0 fL   MCH 30.7 26.0 - 34.0 pg   MCHC 32.3 30.0 - 36.0 g/dL  RDW 13.7 11.5 - 15.5 %   Platelets 233 150 - 400 K/uL   nRBC 0.0 0.0 - 0.2 %    Comment: Performed at [facility], [address], [place], [place] [address]  Basic metabolic panel with GFR     Status: Abnormal   Collection Time: 01/12/24  7:38 AM  Result Value Ref Range   Sodium 139 135 - 145 mmol/L   Potassium 3.4 (L) 3.5 - 5.1 mmol/L   Chloride 110 98 - 111 mmol/L   CO2 23 22 - 32 mmol/L   Glucose, Bld 91 70 - 99 mg/dL    Comment: Glucose reference range applies only to  samples taken after fasting for at least 8 hours.   BUN 29 (H) 8 - 23 mg/dL   Creatinine, Ser 1.19 0.61 - 1.24 mg/dL   Calcium  8.2 (L) 8.9 - 10.3 mg/dL   GFR, Estimated 56 (L) >60 mL/min    Comment: (NOTE) Calculated using the CKD-EPI Creatinine Equation (2021)    Anion gap 6 5 - 15    Comment: Performed at [facility], [address], [place], [place] [address]  CBC with Differential/Platelet     Status: Abnormal   Collection Time: 01/12/24  7:38 AM  Result Value Ref Range   WBC 11.3 (H) 4.0 - 10.5 K/uL   RBC 3.39 (L) 4.22 - 5.81 MIL/uL   Hemoglobin 10.4 (L) 13.0 - 17.0 g/dL   HCT 95.6 (L) 21.3 - 08.6 %   MCV 95.6 80.0 - 100.0 fL   MCH 30.7 26.0 - 34.0 pg   MCHC 32.1 30.0 - 36.0 g/dL   RDW 57.8 46.9 - 62.9 %   Platelets 195 150 - 400 K/uL   nRBC 0.0 0.0 - 0.2 %   Neutrophils Relative % 67 %   Neutro Abs 7.6 1.7 - 7.7 K/uL   Lymphocytes Relative 21 %   Lymphs Abs 2.4 0.7 - 4.0 K/uL   Monocytes Relative 8 %   Monocytes Absolute 0.9 0.1 - 1.0 K/uL   Eosinophils Relative 4 %   Eosinophils Absolute 0.4 0.0 - 0.5 K/uL   Basophils Relative 0 %   Basophils Absolute 0.0 0.0 - 0.1 K/uL   Immature Granulocytes 0 %   Abs Immature Granulocytes 0.04 0.00 - 0.07 K/uL    Comment: Performed at [facility], [address], [place], [place] [address]   DG Swallowing Func-Speech Pathology Result Date: 01/11/2024 Table formatting from the original result was not included. Modified Barium Swallow Study Patient Details Name: JAMEIS NEWSHAM MRN: [id number] Date of Birth: 02/07/1934 Today's Date: 01/11/2024 HPI/PMH: HPI: WALTON DIGILIO is a 89 y.o. male presenting after aspiration event.     Patient was reportedly drinking water earlier today and had a choking sensation and then coughed.  He did not have any food at this time.  No fever no shortness of breath.  He has had issues with aspiration/choking sensation in the past. MBS in 2023 after pontine stroke show "Orally pt exhibited  premature spill with thin into laryngeal vestibule. Mastication of solid and oral control and transit with thicker textures was adequate. Laryngeal penetration to the vocal cords before the swallow present with thin and once aspirated before the swallow. A compensatory strategy of volitional breath hold before the swallow was effective however he was unable to consistently perform without constant cueing. Chin tuck was ineffective to protect airway. There was minimal residue throughout in valleculae. Swallows with  nectar thick were coordinated and safe. Recommend pt's liquids thickened to nectar consistency and SLP at [facility] work with pt on performing breath hold strategy prior to swallow for possible upgrade to thin at bedside." with medical history significant of hypertension, hyperlipidemia, CVA, GERD, CAD, CKD 2, chronic diastolic CHF, anxiety Clinical Impression: Clinical Impression: Pt demonstrates no aspiration on this study, swallow function challeneged extensively with no signfiicant dysphagia observed. Pt initiates swallow sometimes as deep as the pyriform sinues. Pt did best with an oral hold, but when drinking consecutively, at worst, had mild premautre spillage leading to high transient penetration (PAS 2). Esophageal sweep with solids and liquids/pill showed no stasis. Pt recommended to continue a regular diet and thin liquids with basic aspiraiton precations - upright posture, stay attentive to swallow, small sips if possible. Relayed to daughter. Will f/u to observe pt with a meal. Factors that may increase risk of adverse event in presence of aspiration Roderick Civatte & Jessy Morocco 2021): No data recorded Recommendations/Plan: Swallowing Evaluation Recommendations Swallowing Evaluation Recommendations Recommendations: PO diet PO Diet Recommendation: Regular; Thin liquids (Level 0) Liquid Administration via: Cup; Straw Medication Administration: Whole meds with liquid Supervision: Patient able to self-feed  Postural changes: Position pt fully upright for meals Oral care recommendations: Oral care BID (2x/day) Treatment Plan Treatment Plan Treatment recommendations: Therapy as outlined in treatment plan below Follow-up recommendations: Home health SLP Treatment frequency: Min 2x/week Treatment duration: 1 week Interventions: Aspiration precaution training; Patient/family education Recommendations Recommendations for follow up therapy are one component of a multi-disciplinary discharge planning process, led by the attending physician.  Recommendations may be updated based on patient status, additional functional criteria and insurance authorization. Assessment: Orofacial Exam: Orofacial Exam Oral Cavity - Dentition: Adequate natural dentition Anatomy: Anatomy: -- (wide pharynx due to spinal curvature) Boluses Administered: Boluses Administered Boluses Administered: Thin liquids (Level 0); Mildly thick liquids (Level 2, nectar thick); Moderately thick liquids (Level 3, honey thick); Puree; Solid  Oral Impairment Domain: Oral Impairment Domain Lip Closure: No labial escape Tongue control during bolus hold: Cohesive bolus between tongue to palatal seal Bolus preparation/mastication: Timely and efficient chewing and mashing Bolus transport/lingual motion: Brisk tongue motion Oral residue: Complete oral clearance Location of oral residue : N/A Initiation of pharyngeal swallow : Pyriform sinuses  Pharyngeal Impairment Domain: Pharyngeal Impairment Domain Soft palate elevation: No bolus between soft palate (SP)/pharyngeal wall (PW) Laryngeal elevation: Complete superior movement of thyroid  cartilage with complete approximation of arytenoids to epiglottic petiole Anterior hyoid excursion: Complete anterior movement Epiglottic movement: Complete inversion Laryngeal vestibule closure: Complete, no air/contrast in laryngeal vestibule Pharyngeal stripping wave : Present - complete Tongue base retraction: No contrast between tongue  base and posterior pharyngeal wall (PPW) Pharyngeal residue: Complete pharyngeal clearance  Esophageal Impairment Domain: Esophageal Impairment Domain Esophageal clearance upright position: Complete clearance, esophageal coating Pill: Pill Consistency administered: Thin liquids (Level 0) Thin liquids (Level 0): [facility] Penetration/Aspiration Scale Score: Penetration/Aspiration Scale Score 1.  Material does not enter airway: Thin liquids (Level 0); Mildly thick liquids (Level 2, nectar thick); Moderately thick liquids (Level 3, honey thick); Puree; Solid; Pill 2.  Material enters airway, remains ABOVE vocal cords then ejected out: Thin liquids (Level 0) Compensatory Strategies: Compensatory Strategies Compensatory strategies: No   General Information: No data recorded Diet Prior to this Study: NPO   Temperature : Normal   Respiratory Status: WFL   Supplemental O2: None (Room air)   No data recorded Behavior/Cognition: Alert; Cooperative; Pleasant mood Self-Feeding Abilities: Able to self-feed Baseline vocal  quality/speech: Normal Volitional Cough: Able to elicit Volitional Swallow: Able to elicit No data recorded Goal Planning: Prognosis for improved oropharyngeal function: Good No data recorded No data recorded No data recorded Consulted and agree with results and recommendations: Patient; Family member/caregiver Pain: No data recorded End of Session: Start Time:SLP Start Time (ACUTE ONLY): 1210 Stop Time: SLP Stop Time (ACUTE ONLY): 1240 Time Calculation:SLP Time Calculation (min) (ACUTE ONLY): 30 min Charges: SLP Evaluations $ SLP Speech Visit: 1 Visit SLP Evaluations $BSS Swallow: 1 Procedure $MBS Swallow: 1 Procedure $Swallowing Treatment: 1 Procedure SLP visit diagnosis: SLP Visit Diagnosis: Dysphagia, unspecified (R13.10) Past Medical History: Past Medical History: Diagnosis Date  Acute ischemic stroke (HCC) 12/30/2021  CAD (coronary artery disease)   CVA (cerebral vascular accident) (HCC) 09/29/2021   Hyperlipidemia   Hypokalemia 09/29/2021  Low back strain   Shoulder injury   Fell and injured Right shoulder  Stroke [facility])  Past Surgical History: Past Surgical History: Procedure Laterality Date  ANGIOPLASTY    1992-LAD  CARDIAC CATHETERIZATION   DeBlois, Hardin Leys 01/11/2024, 1:35 PM  DG Chest 1 View Result Date: 01/10/2024 CLINICAL DATA:  Aspiration.  Cough. EXAM: CHEST  1 VIEW portable semi upright COMPARISON:  X-ray 10/07/2023.  Older exams as well FINDINGS: Underinflation. Stable cardiopericardial silhouette when adjusted for technique. Calcified aorta. No pneumothorax. Small right pleural effusion identified. There are bilateral lung base opacities seen with some linear components. Right greater left. Infiltrate versus atelectasis. Recommend follow-up. Osteopenia. Diffuse degenerative changes. IMPRESSION: Lung base opacity seen with a small right effusion. Atelectasis versus infiltrate. Recommend follow-up. Electronically Signed   By: Adrianna Horde M.D.   On: 01/10/2024 18:15                             Assessment and Plan:  "Vision Dark"  last night.  Back to normal. Long history of glaucoma.  IOP slightly high OD, but checked by [facility] because we are in a hospital.  Out patient applanation is more accurate.  Pt under care of Dr. Josefa Ni.  Options discussed with patient and family:  They want to try latanoprost  OU at bedtime.  Follow-up with Dr. Grissom with in the next 2 weeks. Macular degeneration.  Follow-up with Dr. Grissom    All questions were answered.   Makinzie Considine L 01/12/2024, 9:58 AM  [facility] Ophthalmology [phone]

## 2024-01-12 NOTE — Evaluation (Signed)
 Occupational Therapy Evaluation Patient Details Name: Clarence Myers MRN: [id number] DOB: [birth year] Today's Date: 01/12/2024   History of Present Illness   89 y.o. male who presents with acute disequilibrium on 10/07/23 and was found to have a small ischemic cerebellar stroke. PMH significant for essential hypertension, hyperlipidemia, history of CAD angioplasty 1992, CKD stage II, CVA x 2, chronic aortic enlargement, aortic stenosis-asymptomatic, GERD and vitamin D deficiency.     Clinical Impressions PTA pt lives at home with his wife and has 24/7 PCA who assist with IADL tasks as needed. Pt active with HHOT/PT. Pt reports that his vision is NOT at baseline. Reports a significant change in his vision beginning last week. Pt with poor visual acuity and vision appears distorted which is affecting his balance and ADL status. Began education on low vision compensatory strategies. Recommend follow up with HHOT. Due to vision changes, pt will need to have direct physical assistance with mobility to reduce risk of falls. Will provide pt with resources regarding low vision.      If plan is discharge home, recommend the following:   A little help with walking and/or transfers;A little help with bathing/dressing/bathroom;Assistance with cooking/housework;Direct supervision/assist for medications management;Direct supervision/assist for financial management;Help with stairs or ramp for entrance;Assist for transportation     Functional Status Assessment   Patient has had a recent decline in their functional status and demonstrates the ability to make significant improvements in function in a reasonable and predictable amount of time.     Equipment Recommendations   None recommended by OT     Recommendations for Other Services         Precautions/Restrictions   Precautions Precautions: Fall;Other (comment) (low vision deficits)     Mobility Bed Mobility                General bed mobility comments: OOB in chair    Transfers Overall transfer level: Needs assistance Equipment used: Rolling walker (2 wheels) Transfers: Sit to/from Stand Sit to Stand: Min assist - increased time                  Balance Overall balance assessment: Needs assistance Sitting-balance support: Feet supported Sitting balance-Leahy Scale: Fair       Standing balance-Leahy Scale: Poor                             ADL either performed or assessed with clinical judgement   ADL Overall ADL's : Needs assistance/impaired Eating/Feeding: Set up;Supervision/ safety;Sitting   Grooming: Set up;Supervision/safety;Sitting   Upper Body Bathing: Set up;Supervision/ safety;Sitting   Lower Body Bathing: Minimal assistance;Sit to/from stand   Upper Body Dressing : Minimal assistance;Sitting   Lower Body Dressing: Moderate assistance;Sit to/from stand   Toilet Transfer: Minimal assistance;Ambulation   Toileting- Clothing Manipulation and Hygiene: Minimal assistance;Sit to/from stand       Functional mobility during ADLs: Minimal assistance;Rolling walker (2 wheels)       Vision Baseline Vision/History: 1 Wears glasses;3 Glaucoma;6 Macular Degeneration Ability to See in Adequate Light: 3 Highly impaired Patient Visual Report: Blurring of vision;Central vision impairment;Peripheral vision impairment Vision Assessment?: Yes Eye Alignment: Within Functional Limits Ocular Range of Motion: Within Functional Limits Alignment/Gaze Preference: Within Defined Limits Tracking/Visual Pursuits: Decreased smoothness of horizontal tracking;Decreased smoothness of vertical tracking Saccades: Decreased speed of saccadic movement;Overshoots;Undershoots Convergence: Impaired (comment) Visual Fields: Right visual field deficit;Left visual field deficit Depth Perception: Undershoots;Overshoots Additional Comments: States vision  is not at baseline; unable to see a  toohbrush in from of him; Able to read very large font on Menu;  unable to see anything on the TV; thought his grand daughter was sittin goutside his window. Thought his door was a window. Increased Contrast appears to hlep     Perception         Praxis         Pertinent Vitals/Pain Pain Assessment Pain Assessment: No/denies pain     Extremity/Trunk Assessment Upper Extremity Assessment Upper Extremity Assessment: RUE deficits/detail (residual R sided weakness but uses functionally as able)   Lower Extremity Assessment Lower Extremity Assessment: Defer to PT evaluation   Cervical / Trunk Assessment Cervical / Trunk Assessment: Kyphotic   Communication Communication Communication: No apparent difficulties   Cognition Arousal: Alert Behavior During Therapy: WFL for tasks assessed/performed Cognition: No apparent impairments (most likely at baseline)                               Following commands: Intact       Cueing  General Comments      several steps forward/backward   Exercises     Shoulder Instructions      Home Living Family/patient expects to be discharged to:: Private residence Living Arrangements: Spouse/significant other Available Help at Discharge: Family;Personal care attendant Type of Home: House Home Access: Stairs to enter [employer] of Steps: 2 Entrance Stairs-Rails: None Home Layout: Able to live on main level with bedroom/bathroom     Bathroom Shower/Tub: Chief Strategy Officer: Handicapped height Bathroom Accessibility: Yes How Accessible: Accessible via walker Home Equipment: BSC/3in1;Tub bench;Rolling Walker (2 wheels);Cane - single point;Grab bars - tub/shower;Hospital bed;Wheelchair - manual          Prior Functioning/Environment Prior Level of Function : Independent/Modified Independent             Mobility Comments: State he is able to ambulate and complete his self care  independently. Has 24/7 PCA.      OT Problem List: Impaired balance (sitting and/or standing);Impaired vision/perception;Decreased safety awareness;Decreased knowledge of use of DME or AE   OT Treatment/Interventions: Self-care/ADL training;Therapeutic exercise;DME and/or AE instruction;Therapeutic activities;Visual/perceptual remediation/compensation;Patient/family education;Balance training      OT Goals(Current goals can be found in the care plan section)   Acute Rehab OT Goals Patient Stated Goal: to see  better OT Goal Formulation: With patient Time For Goal Achievement: 01/26/24 Potential to Achieve Goals: Good   OT Frequency:  Min 2X/week    Co-evaluation              AM-PAC OT "6 Clicks" Daily Activity     Outcome Measure Help from another person eating meals?: A Little Help from another person taking care of personal grooming?: A Little Help from another person toileting, which includes using toliet, bedpan, or urinal?: A Little Help from another person bathing (including washing, rinsing, drying)?: A Little Help from another person to put on and taking off regular upper body clothing?: A Little Help from another person to put on and taking off regular lower body clothing?: A Little 6 Click Score: 18   End of Session Equipment Utilized During Treatment: Gait belt;Rolling walker (2 wheels) Nurse Communication: Mobility status  Activity Tolerance: Patient tolerated treatment well Patient left: in chair;with call bell/phone within reach;with chair alarm set;with family/visitor present  OT Visit Diagnosis: Unsteadiness on feet (R26.81);Other abnormalities of gait and  mobility (R26.89);Low vision, both eyes (H54.2)                Time: 6045-4098 OT Time Calculation (min): 27 min Charges:  OT General Charges $OT Visit: 1 Visit OT Evaluation $OT Eval Moderate Complexity: 1 Mod OT Treatments $Self Care/Home Management : 8-22 mins  Milburn Aliment, OT/L   Acute OT  Clinical Specialist Acute Rehabilitation Services Pager [phone] Office [phone]   [facility] 01/12/2024, 5:27 PM

## 2024-01-13 DIAGNOSIS — J9601 Acute respiratory failure with hypoxia: Secondary | ICD-10-CM | POA: Diagnosis not present

## 2024-01-13 LAB — BASIC METABOLIC PANEL WITH GFR
Anion gap: 5 (ref 5–15)
BUN: 17 mg/dL (ref 8–23)
CO2: 24 mmol/L (ref 22–32)
Calcium: 8 mg/dL — ABNORMAL LOW (ref 8.9–10.3)
Chloride: 110 mmol/L (ref 98–111)
Creatinine, Ser: 1.16 mg/dL (ref 0.61–1.24)
GFR, Estimated: >60 mL/min (ref >60)
Glucose, Bld: 91 mg/dL (ref 70–99)
Potassium: 3.4 mmol/L — ABNORMAL LOW (ref 3.5–5.1)
Sodium: 139 mmol/L (ref 135–145)

## 2024-01-13 LAB — CBC WITH DIFFERENTIAL/PLATELET
Abs Immature Granulocytes: 0.04 10*3/uL (ref 0.00–0.07)
Basophils Absolute: 0 10*3/uL (ref 0.0–0.1)
Basophils Relative: 0 %
Eosinophils Absolute: 0.6 10*3/uL — ABNORMAL HIGH (ref 0.0–0.5)
Eosinophils Relative: 5 %
HCT: 33.6 % — ABNORMAL LOW (ref 39.0–52.0)
Hemoglobin: 11 g/dL — ABNORMAL LOW (ref 13.0–17.0)
Immature Granulocytes: 0 %
Lymphocytes Relative: 17 %
Lymphs Abs: 1.9 10*3/uL (ref 0.7–4.0)
MCH: 30.7 pg (ref 26.0–34.0)
MCHC: 32.7 g/dL (ref 30.0–36.0)
MCV: 93.9 fL (ref 80.0–100.0)
Monocytes Absolute: 1.2 10*3/uL — ABNORMAL HIGH (ref 0.1–1.0)
Monocytes Relative: 10 %
Neutro Abs: 7.7 10*3/uL (ref 1.7–7.7)
Neutrophils Relative %: 68 %
Platelets: 215 10*3/uL (ref 150–400)
RBC: 3.58 MIL/uL — ABNORMAL LOW (ref 4.22–5.81)
RDW: 13.2 % (ref 11.5–15.5)
WBC: 11.4 10*3/uL — ABNORMAL HIGH (ref 4.0–10.5)
nRBC: 0 % (ref 0.0–0.2)

## 2024-01-13 MED ORDER — AMOXICILLIN-POT CLAVULANATE 875-125 MG PO TABS
1.0000 | ORAL_TABLET | Freq: Two times a day (BID) | ORAL | Status: DC
  Administered 2024-01-13 – 2024-01-14 (×3): 1 via ORAL
  Filled 2024-01-13 (×3): qty 1

## 2024-01-13 MED ORDER — SENNOSIDES-DOCUSATE SODIUM 8.6-50 MG PO TABS
1.0000 | ORAL_TABLET | Freq: Two times a day (BID) | ORAL | Status: AC
  Administered 2024-01-13: 1 via ORAL
  Filled 2024-01-13 (×2): qty 1

## 2024-01-13 NOTE — Progress Notes (Addendum)
 TRH ROUNDING NOTE Clarence Myers RUE:[id number]  DOB: 1934-02-04  DOA: 01/10/2024  PCP: Clarence Landau, MD  01/13/2024,8:06 AM  LOS: 2 days    Code Status: Full code   from: Home current Dispo:    89 year old male HTN HLD CAD with angioplasty 1992 CKD 2 CVA X2 with recent acute ischemic stroke 09/2023 Chronic aortic enlargement with atrial stenosis-not a candidate for further management given lack of functional capacity no symptoms Generalized anxiety disorder  Patient was apparently drinking water when he choked did not have any food scheduled --only water--seems this to happen consistently over the past several months and he presents with the symptoms whenever he has reported stroke according to daughter Lives with wife at home gets assistance and usually uses a walker Sodium 142 potassium 3.9 BUN/creatinine 29/1.1 is normal in wBC 16.7 platelet 266, hemoglobin 13.8 1 view CXR lung base opacity seen with small right pleural effusion atelectasis versus infiltrate Antibiotics were considered but held SLP was recommended   Plan  aspiration pneumonia on admission Given elevated white count and clinical scenario Unasyn  started on 5/24-narrowed to Augmentin  to complete a 7-day course as an outpatient-watch overnight to ensure no fever spike but probably can discharge in a.m. SLP is cleared patient for regular diet with strict supervision and thin liquids-continue Tessalon Perles 100 3 times daily, Mucinex 600 twice daily and can use Cepacol lozenge  Monocular decreased vision on left side Patient has a history of glaucoma I have resumed home Restasis  drops 1 drop to both eyes twice daily--Dr. Juanito Myers of ophthalmology saw the patient and felt this could be worked up in the outpatient by Dr. Agapito Myers who is his usual ophthalmologist-they started Xalatan  drops to both eyes at bedtime which will be resumed His vision is improved  CAD angioplasty 1992 Resumed Plavix  75 --completed requisite 3  months of aspirin , resume Crestor  20 daily Patient had some mild hypotension during hospital stay so metoprolol  12.5 was changed to metoprolol  XL 12.5 daily  Multiple strokes recent ischemic stroke 09/2023 Meds as above  Bipolar/depression Continue sertraline  25 at bedtime, Remeron  15 at bedtime  Chronic aortic enlargement Outpatient follow-up  CKD 2 Follow trends of creatinine  DVT prophylaxis: Lovenox   Status is: Observation The patient will require care spanning > 2 midnights and should be moved to inpatient because:   Discussed with another daughter at the bedside this morning-daughter that I spoke to previously was daughter Clarence Myers phone #[phone]   Subjective:  Patient seems a little bit better  Vision is improved with new drops no chest pain no fever no nausea no vomiting a little constipated Was able to walk around on the unit earlier today  Objective + exam Vitals:   01/12/24 0820 01/12/24 1613 01/13/24 0427 01/13/24 0734  BP: 122/65 (!) 114/42 (!) 151/57 (!) 142/63  Pulse: 74 68 68 65  Resp: 17 16 18 18   Temp: 99.6 F (37.6 C) 98.3 F (36.8 C) 99.1 F (37.3 C) 98.7 F (37.1 C)  TempSrc: Oral Oral    SpO2: 94% 96% 93% 93%  Weight:      Height:       Filed Weights   01/10/24 1543  Weight: 66.8 kg    Examination:  Looks well feels fair awakens Holosystolic murmur LLSE Decreased air entry with rales on posterolateral right side Left side is clear Abdominal soft no rebound No lower extremity edema Power is 5/5 grossly  Data Reviewed: reviewed   CBC    Component Value  Date/Time   WBC 11.4 (H) 01/13/2024 0459   RBC 3.58 (L) 01/13/2024 0459   HGB 11.0 (L) 01/13/2024 0459   HCT 33.6 (L) 01/13/2024 0459   PLT 215 01/13/2024 0459   MCV 93.9 01/13/2024 0459   MCH 30.7 01/13/2024 0459   MCHC 32.7 01/13/2024 0459   RDW 13.2 01/13/2024 0459   LYMPHSABS 1.9 01/13/2024 0459   MONOABS 1.2 (H) 01/13/2024 0459   EOSABS 0.6 (H) 01/13/2024  0459   BASOSABS 0.0 01/13/2024 0459      Latest Ref Rng & Units 01/13/2024    4:59 AM 01/12/2024    7:38 AM 01/11/2024    7:31 AM  CMP  Glucose 70 - 99 mg/dL 91  91  161   BUN 8 - 23 mg/dL 17  29  35   Creatinine 0.61 - 1.24 mg/dL 0.96  0.45  4.09   Sodium 135 - 145 mmol/L 139  139  142   Potassium 3.5 - 5.1 mmol/L 3.4  3.4  4.0   Chloride 98 - 111 mmol/L 110  110  110   CO2 22 - 32 mmol/L 24  23  25    Calcium  8.9 - 10.3 mg/dL 8.0  8.2  8.4   Total Protein 6.5 - 8.1 g/dL   6.4   Total Bilirubin 0.0 - 1.2 mg/dL   0.8   Alkaline Phos 38 - 126 U/L   46   AST 15 - 41 U/L   17   ALT 0 - 44 U/L   13     Scheduled Meds:  amoxicillin -clavulanate  1 tablet Oral Q12H   benzonatate  100 mg Oral TID   clopidogrel   75 mg Oral Daily   cycloSPORINE   1 drop Both Eyes BID   enoxaparin  (LOVENOX ) injection  40 mg Subcutaneous Q24H   guaiFENesin  600 mg Oral BID   latanoprost   1 drop Both Eyes QHS   mirtazapine   15 mg Oral QHS   pantoprazole   40 mg Oral Daily   rosuvastatin   20 mg Oral QHS   sertraline   25 mg Oral QHS   sodium chloride  flush  3 mL Intravenous Q12H   Continuous Infusions:  Time 26  Clarence Glisson, MD  [facility]

## 2024-01-13 NOTE — Progress Notes (Signed)
 Transition of Care [facility]) - Inpatient Brief Assessment   Patient Details  Name: Clarence Myers MRN: [id number] Date of Birth: September 13, 1933  Transition of Care [facility]) CM/SW Contact:    Dane Dung, RN Phone Number: 01/13/2024, 11:55 AM   Clinical Narrative: CM met with the patient and daughter at the bedside to discuss TOC needs.  Patient lives at home and has 24 hour care at the home between [facility] agency and daughters.  Patient was offered Medicare choice regarding home health and the daughter prefers [place] home health.  I was unable to reach [facility], RNCM with [facility] but the agency did not answer due to the holiday.  Detailed secure text message was left with the agency.  HH orders were placed in EPIC and MD requested to co-sign.  No other TOC needs and patient plans to discharge home with family tomorrow by car.   Transition of Care Asessment: Insurance and Status: (P) Insurance coverage has been reviewed Patient has primary care physician: (P) Yes Home environment has been reviewed: (P) from home with private pay Personal Care Givers through [facility] Prior level of function:: (P) RW Prior/Current Home Services: (P) Current home services (has 24 hour care through [facility] personal care and family at the home) Social Drivers of Health Review: (P) SDOH reviewed interventions complete Readmission risk has been reviewed: (P) Yes Transition of care needs: (P) transition of care needs identified, TOC will continue to follow

## 2024-01-13 NOTE — Progress Notes (Signed)
 Occupational Therapy Treatment Patient Details Name: Clarence Myers MRN: [id number] DOB: Sep 10, 1933 Today's Date: 01/13/2024   History of present illness 89 y.o. male who presents with acute disequilibrium on 10/07/23 and was found to have a small ischemic cerebellar stroke. PMH significant for essential hypertension, hyperlipidemia, history of CAD angioplasty 1992, CKD stage II, CVA x 2, chronic aortic enlargement, aortic stenosis-asymptomatic, GERD and vitamin D deficiency.   OT comments  Pt reports vision is "a little better" however vision continues to be impaired and distorted. PT states he feels like he is in a "tent".  Plan is to follow up with his eye doctor after DC. Making steady progress toward goals. Able to ambulate @ RW level with min A @ 100 ft with daughter following with recliner. Reviewed low vision compensatory strategies and community low vision resources with pt/daughter - handouts provided. Continue to recommend HHOT and direct assistance with all ADL and mobility @ RW level.       If plan is discharge home, recommend the following:  A little help with walking and/or transfers;A little help with bathing/dressing/bathroom;Assistance with cooking/housework;Direct supervision/assist for medications management;Direct supervision/assist for financial management;Help with stairs or ramp for entrance;Assist for transportation   Equipment Recommendations  None recommended by OT    Recommendations for Other Services      Precautions / Restrictions Precautions Precautions: Fall;Other (comment)       Mobility Bed Mobility Overal bed mobility: Needs Assistance Bed Mobility: Supine to Sit     Supine to sit: Min assist, HOB elevated          Transfers Overall transfer level: Needs assistance Equipment used: Rolling walker (2 wheels) Transfers: Sit to/from Stand Sit to Stand: Min assist                 Balance Overall balance assessment: Needs assistance    Sitting balance-Leahy Scale: Fair       Standing balance-Leahy Scale: Poor                             ADL either performed or assessed with clinical judgement   ADL Overall ADL's : Needs assistance/impaired Eating/Feeding: Set up;Supervision/ safety;Sitting   Grooming: Set up;Supervision/safety;Sitting   Upper Body Bathing: Set up;Supervision/ safety;Sitting   Lower Body Bathing: Minimal assistance;Sit to/from stand       Lower Body Dressing: Moderate assistance               Functional mobility during ADLs: Minimal assistance;Rolling walker (2 wheels);Cueing for safety      Extremity/Trunk Assessment Upper Extremity Assessment Upper Extremity Assessment: Generalized weakness;RUE deficits/detail (residual stroke deficits but attempts to use funcitonally)   Lower Extremity Assessment Lower Extremity Assessment: Defer to PT evaluation        Vision   Additional Comments: vision impaired as noted before; pt reports some improvement today. not able to distinguish if door is open. Increased Contrast helps. Pt/daughter given information on low vision regarding home safety and reducing risk of falls; given community resources for [employer]. Daughter reports they plan to see his opthalmologist.   Perception Perception Perception: Impaired Preception Impairment Details: Spatial orientation (R bias) Perception-Other Comments: distorted vision   Praxis     Communication Communication Communication: No apparent difficulties   Cognition Arousal: Alert Behavior During Therapy: WFL for tasks assessed/performed Cognition: Cognition impaired     Awareness: Online awareness impaired   Attention impairment (select first level of impairment): Selective  attention   OT - Cognition Comments: appeasr more confused than yesterday. Daughter state he thought he was in a tent. Feel visual distortion is most likely contributing to confusion as well                  Following commands: Intact        Cueing      Exercises Exercises: Other exercises Other Exercises Other Exercises: encouraged chair level exercises and incentive spirometer with daughter    Shoulder Instructions       General Comments kyphotic posture; Max repetitive cues for upright posture; daughter states his current posture is basline    Pertinent Vitals/ Pain       Pain Assessment Pain Assessment: No/denies pain  Home Living                                          Prior Functioning/Environment              Frequency  Min 2X/week        Progress Toward Goals  OT Goals(current goals can now be found in the care plan section)  Progress towards OT goals: Progressing toward goals  Acute Rehab OT Goals Patient Stated Goal: to get better and go home OT Goal Formulation: With patient Time For Goal Achievement: 01/26/24 Potential to Achieve Goals: Good ADL Goals Pt Will Perform Lower Body Dressing: with contact guard assist;sit to/from stand Pt Will Transfer to Toilet: with contact guard assist;ambulating Additional ADL Goal #1: Pt/family will independently verbalize 3 strategies to reduce risk of falls Additional ADL Goal #2: Pt will verblaie 3 compensatory strategies for low vision  Plan      Co-evaluation                 AM-PAC OT "6 Clicks" Daily Activity     Outcome Measure   Help from another person eating meals?: A Little Help from another person taking care of personal grooming?: A Little Help from another person toileting, which includes using toliet, bedpan, or urinal?: A Little Help from another person bathing (including washing, rinsing, drying)?: A Little Help from another person to put on and taking off regular upper body clothing?: A Little Help from another person to put on and taking off regular lower body clothing?: A Little 6 Click Score: 18    End of Session Equipment Utilized During Treatment:  Gait belt;Rolling walker (2 wheels)  OT Visit Diagnosis: Unsteadiness on feet (R26.81);Other abnormalities of gait and mobility (R26.89);Low vision, both eyes (H54.2)   Activity Tolerance Patient tolerated treatment well   Patient Left in chair;with call bell/phone within reach;with family/visitor present   Nurse Communication Mobility status        Time: 8119-1478 OT Time Calculation (min): 23 min  Charges: OT General Charges $OT Visit: 1 Visit OT Treatments $Self Care/Home Management : 23-37 mins  Milburn Aliment, OT/L   Acute OT Clinical Specialist Acute Rehabilitation Services Pager [phone] Office [phone]   [facility] 01/13/2024, 10:57 AM

## 2024-01-13 NOTE — Progress Notes (Addendum)
 Speech Language Pathology Treatment: Dysphagia  Patient Details Name: Clarence Myers MRN: [id number] DOB: 09-08-1933 Today's Date: 01/13/2024 Time: 4098-1191 SLP Time Calculation (min) (ACUTE ONLY): 25 min  Assessment / Plan / Recommendation Clinical Impression  F/u after 01/11/24 MBS. Pt did very well on swallow study with no aspiration despite swallow being taxed with large sequential and mixed boluses.  Today he was assisted with breakfast.  Able to feed himself but process is slow and he benefits from tray set-up.  His daughter Clarence Myers arrived part-way through session.  He self-fed thin liquids, eggs, sausage with no difficulty, no s/s of aspiration.  We talked about the importance of attention, small sips, upright posture.  Pt and his dtr verbalized understanding. No further SLP is needed; our service will sign off.   HPI HPI: Clarence Myers is a 89 y.o. male presenting after aspiration event.     Patient was reportedly drinking water earlier today and had a choking sensation and then coughed.  He did not have any food at this time.  No fever no shortness of breath.  He has had issues with aspiration/choking sensation in the past. MBS in 2023 after pontine stroke show "Orally pt exhibited premature spill with thin into laryngeal vestibule. Mastication of solid and oral control and transit with thicker textures was adequate. Laryngeal penetration to the vocal cords before the swallow present with thin and once aspirated before the swallow. A compensatory strategy of volitional breath hold before the swallow was effective however he was unable to consistently perform without constant cueing. Chin tuck was ineffective to protect airway. There was minimal residue throughout in valleculae. Swallows with nectar thick were coordinated and safe." Medical history significant for hypertension, hyperlipidemia, CVA, GERD, CAD, CKD 2, chronic diastolic CHF, anxiety. During current admission, pt underwent MBS on  01/11/24 with much improved results since 2023 study noted above - no aspiration.       SLP Plan  All goals met      Recommendations for follow up therapy are one component of a multi-disciplinary discharge planning process, led by the attending physician.  Recommendations may be updated based on patient status, additional functional criteria and insurance authorization.    Recommendations  Diet recommendations: Regular;Thin liquid Liquids provided via: Cup;Straw Medication Administration: Whole meds with liquid Supervision: Patient able to self feed;Staff to assist with self feeding Compensations: Slow rate;Small sips/bites                  Oral care BID     Dysphagia, unspecified (R13.10)     All goals met    Clarence Wieck L. Beatris Lincoln, MA CCC/SLP Clinical Specialist - Acute Care SLP Acute Rehabilitation Services Office number [phone]  Clarence Myers  01/13/2024, 9:25 AM

## 2024-01-14 DIAGNOSIS — J9601 Acute respiratory failure with hypoxia: Secondary | ICD-10-CM | POA: Diagnosis not present

## 2024-01-14 LAB — CBC WITH DIFFERENTIAL/PLATELET
Abs Immature Granulocytes: 0.03 10*3/uL (ref 0.00–0.07)
Basophils Absolute: 0.1 10*3/uL (ref 0.0–0.1)
Basophils Relative: 1 %
Eosinophils Absolute: 0.5 10*3/uL (ref 0.0–0.5)
Eosinophils Relative: 5 %
HCT: 32.8 % — ABNORMAL LOW (ref 39.0–52.0)
Hemoglobin: 11 g/dL — ABNORMAL LOW (ref 13.0–17.0)
Immature Granulocytes: 0 %
Lymphocytes Relative: 20 %
Lymphs Abs: 2.1 10*3/uL (ref 0.7–4.0)
MCH: 31.1 pg (ref 26.0–34.0)
MCHC: 33.5 g/dL (ref 30.0–36.0)
MCV: 92.7 fL (ref 80.0–100.0)
Monocytes Absolute: 1.1 10*3/uL — ABNORMAL HIGH (ref 0.1–1.0)
Monocytes Relative: 11 %
Neutro Abs: 6.5 10*3/uL (ref 1.7–7.7)
Neutrophils Relative %: 63 %
Platelets: 236 10*3/uL (ref 150–400)
RBC: 3.54 MIL/uL — ABNORMAL LOW (ref 4.22–5.81)
RDW: 12.9 % (ref 11.5–15.5)
WBC: 10.3 10*3/uL (ref 4.0–10.5)
nRBC: 0 % (ref 0.0–0.2)

## 2024-01-14 LAB — BASIC METABOLIC PANEL WITH GFR
Anion gap: 9 (ref 5–15)
BUN: 16 mg/dL (ref 8–23)
CO2: 23 mmol/L (ref 22–32)
Calcium: 8.5 mg/dL — ABNORMAL LOW (ref 8.9–10.3)
Chloride: 107 mmol/L (ref 98–111)
Creatinine, Ser: 1.05 mg/dL (ref 0.61–1.24)
GFR, Estimated: >60 mL/min (ref >60)
Glucose, Bld: 93 mg/dL (ref 70–99)
Potassium: 3.4 mmol/L — ABNORMAL LOW (ref 3.5–5.1)
Sodium: 139 mmol/L (ref 135–145)

## 2024-01-14 MED ORDER — AMOXICILLIN-POT CLAVULANATE 875-125 MG PO TABS: 1.0000 | ORAL_TABLET | Freq: Two times a day (BID) | ORAL | 0 refills | Status: AC

## 2024-01-14 MED ORDER — LATANOPROST 0.005 % OP SOLN: 1.0000 [drp] | Freq: Every day | OPHTHALMIC | 12 refills | Status: AC

## 2024-01-14 MED ORDER — METOPROLOL SUCCINATE ER 25 MG PO TB24: 12.5000 mg | ORAL_TABLET | Freq: Every day | ORAL | 3 refills | Status: AC

## 2024-01-14 MED ORDER — SERTRALINE HCL 25 MG PO TABS
50.0000 mg | ORAL_TABLET | Freq: Every day | ORAL | Status: DC
Start: 2024-01-14 — End: 2024-05-21

## 2024-01-14 NOTE — Progress Notes (Signed)
 Discharge instructions along with discharge paperwork given to patient and family at bedside. Family voiced understanding. PIV removed and dry drsg applied.

## 2024-01-14 NOTE — Discharge Summary (Signed)
 Physician Discharge Summary  Clarence Myers UJW:[id number] DOB: 1933/08/26 DOA: 01/10/2024  PCP: Glena Landau, MD  Admit date: 01/10/2024 Discharge date: 01/14/2024  Time spent: 46 minutes  Recommendations for Outpatient Follow-up:  Recommend home health PT OT as well as speech in the outpatient setting for maximization of his therapy needs and helping him with swallow exercises Get a CBC Chem-7 in about 1 week Get CXR in 1 month Note formulation change of metoprolol  to metoprolol  XL Needs follow-up with ophthalmology for glaucoma-Xalatan  drops at this admission  Discharge Diagnoses:  MAIN problem for hospitalization   Aspiration pneumonia  Please see below for itemized issues addressed in HOpsital- refer to other progress notes for clarity if needed  Discharge Condition: Improved  Diet recommendation: Regular thin liquids  Filed Weights   01/10/24 1543  Weight: 66.8 kg    History of present illness:  89 year old male HTN HLD CAD with angioplasty 1992 CKD 2 CVA X2 with recent acute ischemic stroke 09/2023 Chronic aortic enlargement with atrial stenosis-not a candidate for further management given lack of functional capacity no symptoms Generalized anxiety disorder   Patient was apparently drinking water when he choked did not have any food scheduled --only water--seems this to happen consistently over the past several months and he presents with the symptoms whenever he has reported stroke according to daughter Lives with wife at home gets assistance and usually uses a walker Sodium 142 potassium 3.9 BUN/creatinine 29/1.1 is normal in wBC 16.7 platelet 266, hemoglobin 13.8 1 view CXR lung base opacity seen with small right pleural effusion atelectasis versus infiltrate Antibiotics were considered but held SLP was recommended     Plan   aspiration pneumonia on admission Given elevated white count and clinical scenario Unasyn  started on 5/24-narrowed to Augmentin  to  complete a 7-day course --Meds called into his pharmacy SLP is cleared patient for regular diet with strict supervision and thin liquids-outpatient speech PT OT recommended as home health as above   Monocular decreased vision on left side Patient has a history of glaucoma I have resumed home Restasis  drops 1 drop to both eyes twice daily--Dr. Juanito Norma of ophthalmology saw the patient and felt this could be worked up in the outpatient by Dr. Agapito Alcide who is his usual ophthalmologist-they started Xalatan  drops to both eyes at bedtime which will be resumed His vision is improved and he has had no further issues and can follow-up at an interval   CAD angioplasty 1992 Resumed Plavix  75 --completed requisite 3 months of aspirin , resume Crestor  20 daily Patient had some mild hypotension during hospital stay so metoprolol  12.5 was changed to metoprolol  XL 12.5 daily   Multiple strokes recent ischemic stroke 09/2023 Meds as above   Bipolar/depression Continue sertraline  25 at bedtime, Remeron  15 at bedtime   Chronic aortic enlargement Outpatient follow-up   AKI on admission CKD 2 Follow trends of creatinine-was placed on fluids transiently during hospital stay and improved  Discharge Exam: Vitals:   01/14/24 0555 01/14/24 0710  BP: 136/70 133/69  Pulse: 65 72  Resp: 18 18  Temp: 98.9 F (37.2 C) 98.6 F (37 C)  SpO2: 93% 94%    Subj on day of d/c   Awake coherent alert no distress looks well feels fair was a little weak yesterday also had a little constipation  General Exam on discharge  EOMI NCAT no focal deficit no icterus no pallor Chest is clear no wheeze rales rhonchi S1-S2 no murmur ROM is intact Power is 5/5  Discharge Instructions   Discharge Instructions     Diet - low sodium heart healthy   Complete by: As directed    Discharge instructions   Complete by: As directed    Make sure that complete the course of antibiotics that you have been prescribed for  pneumonia You will get home health therapy including speech therapy to come out and help you with medication as well as therapy recommendations as you have a risk of having this recurrent Please follow-up with your ophthalmologist for your visual changes and continue the new eyedrops Report any new changes to regular physician or if severe such as chest pain fever chills and/or altered mental status come straight to the emergency room   Increase activity slowly   Complete by: As directed       Allergies as of 01/14/2024       Reactions   Bee Venom Anaphylaxis, Swelling   Blue Crab (cavinectes Sapidus) Allergy Skin Test Hives, Swelling, Other (See Comments), Cough   Shellfish-derived Products Hives        Medication List     STOP taking these medications    metoprolol  tartrate 25 MG tablet Commonly known as: LOPRESSOR        TAKE these medications    acetaminophen  325 MG tablet Commonly known as: TYLENOL  Take 2 tablets (650 mg total) by mouth every 4 (four) hours as needed for mild pain (or temp > 37.5 C (99.5 F)).   amoxicillin -clavulanate 875-125 MG tablet Commonly known as: AUGMENTIN  Take 1 tablet by mouth every 12 (twelve) hours for 5 days.   clopidogrel  75 MG tablet Commonly known as: PLAVIX  Take 1 tablet (75 mg total) by mouth daily. RESUME ONLY AFTER COMPLETION OF 30 DAYS OF ASPIRIN  AND BRILINTA  What changed: additional instructions   FISH OIL PO Take 1,000 mg by mouth in the morning and at bedtime.   latanoprost  0.005 % ophthalmic solution Commonly known as: XALATAN  Place 1 drop into both eyes at bedtime.   metoprolol  succinate 25 MG 24 hr tablet Commonly known as: Toprol  XL Take 0.5 tablets (12.5 mg total) by mouth daily.   mirtazapine  15 MG tablet Commonly known as: REMERON  Take 15 mg by mouth at bedtime.   MULTI-VITAMIN PO Take 30 mLs by mouth daily with breakfast.   multivitamin-lutein  Caps capsule Take 1 capsule by mouth daily.   omeprazole  20 MG capsule Commonly known as: PRILOSEC Take 20 mg by mouth daily before breakfast.   PROBIOTIC DAILY PO Take 1 capsule by mouth daily.   Restasis  0.05 % ophthalmic emulsion Generic drug: cycloSPORINE  Place 1 drop into both eyes 2 (two) times daily.   rosuvastatin  20 MG tablet Commonly known as: CRESTOR  Take 1 tablet (20 mg total) by mouth daily. What changed: when to take this   sertraline  25 MG tablet Commonly known as: ZOLOFT  Take 2 tablets (50 mg total) by mouth at bedtime. What changed: medication strength       Allergies  Allergen Reactions   Bee Venom Anaphylaxis and Swelling   Blue Crab (Cavinectes Sapidus) Allergy Skin Test Hives, Swelling, Other (See Comments) and Cough   Shellfish-Derived Products Hives    Follow-up Information     [facility] (Former[facility] Follow up.   Why: [facility] will call you to set up home health services in the next 24-48 hours after discharge to home. Contact information: [address] [place] [address] [phone]  The results of significant diagnostics from this hospitalization (including imaging, microbiology, ancillary and laboratory) are listed below for reference.    Significant Diagnostic Studies: DG Swallowing Func-Speech Pathology Result Date: 01/11/2024 Table formatting from the original result was not included. Modified Barium Swallow Study Patient Details Name: LADAINIAN THERIEN MRN: [id number] Date of Birth: 03-01-1934 Today's Date: 01/11/2024 HPI/PMH: HPI: ANAN DAPOLITO is a 89 y.o. male presenting after aspiration event.     Patient was reportedly drinking water earlier today and had a choking sensation and then coughed.  He did not have any food at this time.  No fever no shortness of breath.  He has had issues with aspiration/choking sensation in the past. MBS in 2023 after pontine stroke show "Orally pt exhibited premature spill with thin into  laryngeal vestibule. Mastication of solid and oral control and transit with thicker textures was adequate. Laryngeal penetration to the vocal cords before the swallow present with thin and once aspirated before the swallow. A compensatory strategy of volitional breath hold before the swallow was effective however he was unable to consistently perform without constant cueing. Chin tuck was ineffective to protect airway. There was minimal residue throughout in valleculae. Swallows with nectar thick were coordinated and safe. Recommend pt's liquids thickened to nectar consistency and SLP at [facility] work with pt on performing breath hold strategy prior to swallow for possible upgrade to thin at bedside." with medical history significant of hypertension, hyperlipidemia, CVA, GERD, CAD, CKD 2, chronic diastolic CHF, anxiety Clinical Impression: Clinical Impression: Pt demonstrates no aspiration on this study, swallow function challeneged extensively with no signfiicant dysphagia observed. Pt initiates swallow sometimes as deep as the pyriform sinues. Pt did best with an oral hold, but when drinking consecutively, at worst, had mild premautre spillage leading to high transient penetration (PAS 2). Esophageal sweep with solids and liquids/pill showed no stasis. Pt recommended to continue a regular diet and thin liquids with basic aspiraiton precations - upright posture, stay attentive to swallow, small sips if possible. Relayed to daughter. Will f/u to observe pt with a meal. Factors that may increase risk of adverse event in presence of aspiration Roderick Civatte & Jessy Morocco 2021): No data recorded Recommendations/Plan: Swallowing Evaluation Recommendations Swallowing Evaluation Recommendations Recommendations: PO diet PO Diet Recommendation: Regular; Thin liquids (Level 0) Liquid Administration via: Cup; Straw Medication Administration: Whole meds with liquid Supervision: Patient able to self-feed Postural changes: Position pt fully  upright for meals Oral care recommendations: Oral care BID (2x/day) Treatment Plan Treatment Plan Treatment recommendations: Therapy as outlined in treatment plan below Follow-up recommendations: Home health SLP Treatment frequency: Min 2x/week Treatment duration: 1 week Interventions: Aspiration precaution training; Patient/family education Recommendations Recommendations for follow up therapy are one component of a multi-disciplinary discharge planning process, led by the attending physician.  Recommendations may be updated based on patient status, additional functional criteria and insurance authorization. Assessment: Orofacial Exam: Orofacial Exam Oral Cavity - Dentition: Adequate natural dentition Anatomy: Anatomy: -- (wide pharynx due to spinal curvature) Boluses Administered: Boluses Administered Boluses Administered: Thin liquids (Level 0); Mildly thick liquids (Level 2, nectar thick); Moderately thick liquids (Level 3, honey thick); Puree; Solid  Oral Impairment Domain: Oral Impairment Domain Lip Closure: No labial escape Tongue control during bolus hold: Cohesive bolus between tongue to palatal seal Bolus preparation/mastication: Timely and efficient chewing and mashing Bolus transport/lingual motion: Brisk tongue motion Oral residue: Complete oral clearance Location of oral residue : N/A Initiation of pharyngeal swallow : Pyriform sinuses  Pharyngeal Impairment  Domain: Pharyngeal Impairment Domain Soft palate elevation: No bolus between soft palate (SP)/pharyngeal wall (PW) Laryngeal elevation: Complete superior movement of thyroid  cartilage with complete approximation of arytenoids to epiglottic petiole Anterior hyoid excursion: Complete anterior movement Epiglottic movement: Complete inversion Laryngeal vestibule closure: Complete, no air/contrast in laryngeal vestibule Pharyngeal stripping wave : Present - complete Tongue base retraction: No contrast between tongue base and posterior pharyngeal wall  (PPW) Pharyngeal residue: Complete pharyngeal clearance  Esophageal Impairment Domain: Esophageal Impairment Domain Esophageal clearance upright position: Complete clearance, esophageal coating Pill: Pill Consistency administered: Thin liquids (Level 0) Thin liquids (Level 0): [facility] Penetration/Aspiration Scale Score: Penetration/Aspiration Scale Score 1.  Material does not enter airway: Thin liquids (Level 0); Mildly thick liquids (Level 2, nectar thick); Moderately thick liquids (Level 3, honey thick); Puree; Solid; Pill 2.  Material enters airway, remains ABOVE vocal cords then ejected out: Thin liquids (Level 0) Compensatory Strategies: Compensatory Strategies Compensatory strategies: No   General Information: No data recorded Diet Prior to this Study: NPO   Temperature : Normal   Respiratory Status: WFL   Supplemental O2: None (Room air)   No data recorded Behavior/Cognition: Alert; Cooperative; Pleasant mood Self-Feeding Abilities: Able to self-feed Baseline vocal quality/speech: Normal Volitional Cough: Able to elicit Volitional Swallow: Able to elicit No data recorded Goal Planning: Prognosis for improved oropharyngeal function: Good No data recorded No data recorded No data recorded Consulted and agree with results and recommendations: Patient; Family member/caregiver Pain: No data recorded End of Session: Start Time:SLP Start Time (ACUTE ONLY): 1210 Stop Time: SLP Stop Time (ACUTE ONLY): 1240 Time Calculation:SLP Time Calculation (min) (ACUTE ONLY): 30 min Charges: SLP Evaluations $ SLP Speech Visit: 1 Visit SLP Evaluations $BSS Swallow: 1 Procedure $MBS Swallow: 1 Procedure $Swallowing Treatment: 1 Procedure SLP visit diagnosis: SLP Visit Diagnosis: Dysphagia, unspecified (R13.10) Past Medical History: Past Medical History: Diagnosis Date  Acute ischemic stroke (HCC) 12/30/2021  CAD (coronary artery disease)   CVA (cerebral vascular accident) (HCC) 09/29/2021  Hyperlipidemia   Hypokalemia 09/29/2021  Low  back strain   Shoulder injury   Fell and injured Right shoulder  Stroke [facility])  Past Surgical History: Past Surgical History: Procedure Laterality Date  ANGIOPLASTY    1992-LAD  CARDIAC CATHETERIZATION   DeBlois, Hardin Leys 01/11/2024, 1:35 PM  DG Chest 1 View Result Date: 01/10/2024 CLINICAL DATA:  Aspiration.  Cough. EXAM: CHEST  1 VIEW portable semi upright COMPARISON:  X-ray 10/07/2023.  Older exams as well FINDINGS: Underinflation. Stable cardiopericardial silhouette when adjusted for technique. Calcified aorta. No pneumothorax. Small right pleural effusion identified. There are bilateral lung base opacities seen with some linear components. Right greater left. Infiltrate versus atelectasis. Recommend follow-up. Osteopenia. Diffuse degenerative changes. IMPRESSION: Lung base opacity seen with a small right effusion. Atelectasis versus infiltrate. Recommend follow-up. Electronically Signed   By: Adrianna Horde M.D.   On: 01/10/2024 18:15   US  Carotid Bilateral Result Date: 01/01/2024 CLINICAL DATA:  89 year old male cerebrovascular accident EXAM: BILATERAL CAROTID DUPLEX ULTRASOUND TECHNIQUE: Martina Sledge scale imaging, color Doppler and duplex ultrasound were performed of bilateral carotid and vertebral arteries in the neck. COMPARISON:  None Available. FINDINGS: Criteria: Quantification of carotid stenosis is based on velocity parameters that correlate the residual internal carotid diameter with NASCET-based stenosis levels, using the diameter of the distal internal carotid lumen as the denominator for stenosis measurement. The following velocity measurements were obtained: RIGHT ICA:  Systolic 60 cm/sec, Diastolic 16 cm/sec CCA:  89 cm/sec SYSTOLIC ICA/CCA RATIO:  0.7 ECA:  98 cm/sec LEFT ICA:  Systolic 74 cm/sec, Diastolic 21 cm/sec CCA:  106 cm/sec SYSTOLIC ICA/CCA RATIO:  0.7 ECA:  113 cm/sec Right Brachial SBP: Not acquired Left Brachial SBP: Not acquired RIGHT CAROTID ARTERY: No significant calcifications  of the right common carotid artery. Intermediate waveform maintained. Heterogeneous and partially calcified plaque at the right carotid bifurcation. No significant lumen shadowing. Low resistance waveform of the right ICA. No significant tortuosity. RIGHT VERTEBRAL ARTERY: Antegrade flow with low resistance waveform. LEFT CAROTID ARTERY: No significant calcifications of the left common carotid artery. Intermediate waveform maintained. Heterogeneous and partially calcified plaque at the left carotid bifurcation. No significant lumen shadowing. Low resistance waveform of the left ICA. No significant tortuosity. LEFT VERTEBRAL ARTERY:  Antegrade flow with low resistance waveform. IMPRESSION: Color duplex indicates minimal heterogeneous and calcified plaque, with no hemodynamically significant stenosis by duplex criteria in the extracranial cerebrovascular circulation. Signed, Marciano Settles. Rexine Cater, RPVI [facility] Electronically Signed   By: Myrlene Asper D.O.   On: 01/01/2024 08:12    Microbiology: No results found for this or any previous visit (from the past 240 hours).   Labs: Basic Metabolic Panel: Recent Labs  Lab 01/10/24 1630 01/10/24 1640 01/11/24 0731 01/12/24 0738 01/13/24 0459 01/14/24 0441  NA 142 143 142 139 139 139  K 3.9 3.8 4.0 3.4* 3.4* 3.4*  CL 107  --  110 110 110 107  CO2 26  --  25 23 24 23   GLUCOSE 120*  --  125* 91 91 93  BUN 29*  --  35* 29* 17 16  CREATININE 1.14  --  1.33* 1.23 1.16 1.05  CALCIUM  9.4  --  8.4* 8.2* 8.0* 8.5*   Liver Function Tests: Recent Labs  Lab 01/10/24 1630 01/11/24 0731  AST 24 17  ALT 14 13  ALKPHOS 64 46  BILITOT 0.8 0.8  PROT 7.9 6.4*  ALBUMIN 3.7 2.9*   No results for input(s): "LIPASE", "AMYLASE" in the last 168 hours. No results for input(s): "AMMONIA" in the last 168 hours. CBC: Recent Labs  Lab 01/10/24 1630 01/10/24 1640 01/11/24 0731 01/12/24 0738  01/13/24 0459 01/14/24 0441  WBC 16.7*  --  17.3* 11.3* 11.4* 10.3  NEUTROABS 14.0*  --   --  7.6 7.7 6.5  HGB 13.8 13.6 11.1* 10.4* 11.0* 11.0*  HCT 41.5 40.0 34.4* 32.4* 33.6* 32.8*  MCV 93.7  --  95.3 95.6 93.9 92.7  PLT 266  --  233 195 215 236   Cardiac Enzymes: No results for input(s): "CKTOTAL", "CKMB", "CKMBINDEX", "TROPONINI" in the last 168 hours. BNP: BNP (last 3 results) No results for input(s): "BNP" in the last 8760 hours.  ProBNP (last 3 results) No results for input(s): "PROBNP" in the last 8760 hours.  CBG: No results for input(s): "GLUCAP" in the last 168 hours.  Signed:  Verlie Glisson MD   [facility] 01/14/2024, 7:22 AM

## 2024-01-14 NOTE — Progress Notes (Signed)
 Physical Therapy Treatment Patient Details Name: Clarence Myers MRN: [id number] DOB: 03/05/1934 Today's Date: 01/14/2024   History of Present Illness 89 y.o. male who presents with acute disequilibrium on 10/07/23 and was found to have a small ischemic cerebellar stroke. PMH significant for essential hypertension, hyperlipidemia, history of CAD angioplasty 1992, CKD stage II, CVA x 2, chronic aortic enlargement, aortic stenosis-asymptomatic, GERD and vitamin D deficiency.    PT Comments  Pt received in supine, dressed for discharge and agreeable to therapy session to work on transfer OOB to transport chair and safety with AD during transfers/gait. Once standing, pt requesting to use bathroom (condom cath already removed) so pt assisted with short gait trial to bathroom, then pt requesting some time to try to urinate/have BM. Daughter present in room and agreeable to help him notify staff via toilet call bell and PTA notified x2 nurse techs pt will be ready for DC after he is done in bathroom, transport chair already brought to his room. Pt continues to benefit from PT services to progress toward functional mobility goals.     If plan is discharge home, recommend the following: A little help with walking and/or transfers;A little help with bathing/dressing/bathroom;Assistance with cooking/housework;Assist for transportation;Help with stairs or ramp for entrance   Can travel by private vehicle        Equipment Recommendations  None recommended by PT    Recommendations for Other Services       Precautions / Restrictions Precautions Precautions: Fall;Other (comment) Recall of Precautions/Restrictions: Impaired Restrictions Weight Bearing Restrictions Per Provider Order: No     Mobility  Bed Mobility Overal bed mobility: Needs Assistance Bed Mobility: Supine to Sit     Supine to sit: HOB elevated, Mod assist, Used rails     General bed mobility comments: increased time and effort  to achieve upright    Transfers Overall transfer level: Needs assistance Equipment used: Rolling walker (2 wheels) Transfers: Sit to/from Stand Sit to Stand: Mod assist, From elevated surface           General transfer comment: Assist to power up and anterior weight shift, cues for UE placement needed; Pt weaker on RUE so pushing from bed with LUE.    Ambulation/Gait Ambulation/Gait assistance: Min assist Gait Distance (Feet): 15 Feet Assistive device: Rolling walker (2 wheels) Gait Pattern/deviations: Step-to pattern, Decreased step length - right, Decreased step length - left, Shuffle, Trunk flexed       General Gait Details: Assist for balance and support as well as guidance due to poor vision. Verbal cues to incr step length and multimodal cues to maintain better proximity to RW as he tends to stand multiple feet behind RW.   Stairs             Wheelchair Mobility     Tilt Bed    Modified Rankin (Stroke Patients Only)       Balance Overall balance assessment: Needs assistance Sitting-balance support: Feet supported, Bilateral upper extremity supported Sitting balance-Leahy Scale: Fair Sitting balance - Comments: posterior lean needing minA progressing to Supervision, pt sitting EOB ~5 mins prior to standing with initial mild c/o nausea and PTA obtained blue bag for him but pt did not have any episode of emesis during session.   Standing balance support: Bilateral upper extremity supported, Reliant on assistive device for balance Standing balance-Leahy Scale: Poor Standing balance comment: RW and external assist needed  Communication Communication Communication: No apparent difficulties  Cognition Arousal: Alert Behavior During Therapy: WFL for tasks assessed/performed   PT - Cognitive impairments: Problem solving, Initiation                       PT - Cognition Comments: slow to process Following  commands: Intact      Cueing Cueing Techniques: Verbal cues, Gestural cues, Tactile cues  Exercises      General Comments General comments (skin integrity, edema, etc.): Pt needing to use bathroom so NT x2 notified at end of session and pt daughter in room with him, agreeable to help him pull wall call bell prior to getting him up from toilet so NT can assist.      Pertinent Vitals/Pain Pain Assessment Pain Assessment: No/denies pain    Home Living                          Prior Function            PT Goals (current goals can now be found in the care plan section) Acute Rehab PT Goals Patient Stated Goal: return home PT Goal Formulation: With patient Time For Goal Achievement: 01/26/24 Progress towards PT goals: Progressing toward goals    Frequency    Min 2X/week      PT Plan      Co-evaluation              AM-PAC PT "6 Clicks" Mobility   Outcome Measure  Help needed turning from your back to your side while in a flat bed without using bedrails?: A Little Help needed moving from lying on your back to sitting on the side of a flat bed without using bedrails?: A Lot Help needed moving to and from a bed to a chair (including a wheelchair)?: A Lot Help needed standing up from a chair using your arms (e.g., wheelchair or bedside chair)?: A Lot Help needed to walk in hospital room?: A Little Help needed climbing 3-5 steps with a railing? : Total 6 Click Score: 13    End of Session Equipment Utilized During Treatment: Gait belt Activity Tolerance: Patient tolerated treatment well Patient left: with call bell/phone within reach;with family/visitor present;Other (comment) (pt on toilet, daughter in room) Nurse Communication: Mobility status;Other (comment) (pt toileting prior to getting to transport wheelchair, then will be ready for DC) PT Visit Diagnosis: Unsteadiness on feet (R26.81);Other abnormalities of gait and mobility (R26.89);Muscle weakness  (generalized) (M62.81)     Time: 1050-1103 PT Time Calculation (min) (ACUTE ONLY): 13 min  Charges:    $Therapeutic Activity: 8-22 mins PT General Charges $$ ACUTE PT VISIT: 1 Visit                     Greenlee Ancheta P., PTA Acute Rehabilitation Services Secure Chat Preferred 9a-5:30pm Office: [phone]    Mariel Shope [facility] 01/14/2024, 1:08 PM

## 2024-01-14 NOTE — TOC Transition Note (Signed)
 Transition of Care [facility]) - Discharge Note   Patient Details  Name: Clarence Myers MRN: [id number] Date of Birth: [birth year]  Transition of Care [facility] [address]) CM/SW Contact:  Dane Dung, RN Phone Number: 01/14/2024, 8:58 AM   Clinical Narrative:    CM called and spoke with Charlann Confer, CM with [facility] and the company accepted the patient for services.  Patient will be discharging home with family today.         Patient Goals and CMS Choice            Discharge Placement                       Discharge Plan and Services Additional resources added to the After Visit Summary for                                       Social Drivers of Health (SDOH) Interventions SDOH Screenings   Food Insecurity: No Food Insecurity (01/10/2024)  Housing: Low Risk  (01/10/2024)  Transportation Needs: No Transportation Needs (01/10/2024)  Utilities: Not At Risk (01/10/2024)  Social Connections: Moderately Isolated (01/10/2024)  Tobacco Use: Low Risk  (01/10/2024)     Readmission Risk Interventions    01/13/2024   11:53 AM  Readmission Risk Prevention Plan  Transportation Screening Complete  PCP or Specialist Appt within 5-7 Days Complete  Home Care Screening Complete  Medication Review (RN CM) Complete

## 2024-01-16 DIAGNOSIS — H409 Unspecified glaucoma: Secondary | ICD-10-CM | POA: Diagnosis not present

## 2024-01-16 DIAGNOSIS — I69351 Hemiplegia and hemiparesis following cerebral infarction affecting right dominant side: Secondary | ICD-10-CM | POA: Diagnosis not present

## 2024-01-16 DIAGNOSIS — J690 Pneumonitis due to inhalation of food and vomit: Secondary | ICD-10-CM | POA: Diagnosis not present

## 2024-01-29 ENCOUNTER — Other Ambulatory Visit: Payer: Self-pay

## 2024-01-29 ENCOUNTER — Emergency Department (HOSPITAL_COMMUNITY)

## 2024-01-29 ENCOUNTER — Inpatient Hospital Stay (HOSPITAL_COMMUNITY)
Admission: EM | Admit: 2024-01-29 | Discharge: 2024-02-04 | DRG: 871 | Disposition: A | Discharge status: Home Health | Attending: Internal Medicine | Admitting: Internal Medicine

## 2024-01-29 DIAGNOSIS — Z515 Encounter for palliative care: Secondary | ICD-10-CM

## 2024-01-29 DIAGNOSIS — R0602 Shortness of breath: Secondary | ICD-10-CM | POA: Diagnosis not present

## 2024-01-29 DIAGNOSIS — F411 Generalized anxiety disorder: Secondary | ICD-10-CM | POA: Diagnosis not present

## 2024-01-29 DIAGNOSIS — I69351 Hemiplegia and hemiparesis following cerebral infarction affecting right dominant side: Secondary | ICD-10-CM | POA: Diagnosis not present

## 2024-01-29 DIAGNOSIS — I700 Atherosclerosis of aorta: Secondary | ICD-10-CM | POA: Diagnosis not present

## 2024-01-29 DIAGNOSIS — K219 Gastro-esophageal reflux disease without esophagitis: Secondary | ICD-10-CM | POA: Diagnosis not present

## 2024-01-29 DIAGNOSIS — I519 Heart disease, unspecified: Secondary | ICD-10-CM | POA: Diagnosis not present

## 2024-01-29 DIAGNOSIS — J189 Pneumonia, unspecified organism: Secondary | ICD-10-CM | POA: Diagnosis present

## 2024-01-29 DIAGNOSIS — Z7902 Long term (current) use of antithrombotics/antiplatelets: Secondary | ICD-10-CM | POA: Diagnosis not present

## 2024-01-29 DIAGNOSIS — I10 Essential (primary) hypertension: Secondary | ICD-10-CM | POA: Diagnosis present

## 2024-01-29 DIAGNOSIS — A419 Sepsis, unspecified organism: Secondary | ICD-10-CM | POA: Diagnosis not present

## 2024-01-29 DIAGNOSIS — E876 Hypokalemia: Secondary | ICD-10-CM | POA: Diagnosis not present

## 2024-01-29 DIAGNOSIS — Z9861 Coronary angioplasty status: Secondary | ICD-10-CM | POA: Diagnosis not present

## 2024-01-29 DIAGNOSIS — E860 Dehydration: Secondary | ICD-10-CM | POA: Diagnosis not present

## 2024-01-29 DIAGNOSIS — E785 Hyperlipidemia, unspecified: Secondary | ICD-10-CM | POA: Diagnosis present

## 2024-01-29 DIAGNOSIS — T17908A Unspecified foreign body in respiratory tract, part unspecified causing other injury, initial encounter: Secondary | ICD-10-CM | POA: Diagnosis present

## 2024-01-29 DIAGNOSIS — I5032 Chronic diastolic (congestive) heart failure: Secondary | ICD-10-CM | POA: Diagnosis not present

## 2024-01-29 DIAGNOSIS — N182 Chronic kidney disease, stage 2 (mild): Secondary | ICD-10-CM | POA: Diagnosis not present

## 2024-01-29 DIAGNOSIS — I2510 Atherosclerotic heart disease of native coronary artery without angina pectoris: Secondary | ICD-10-CM | POA: Diagnosis present

## 2024-01-29 DIAGNOSIS — Z8673 Personal history of transient ischemic attack (TIA), and cerebral infarction without residual deficits: Secondary | ICD-10-CM

## 2024-01-29 DIAGNOSIS — J690 Pneumonitis due to inhalation of food and vomit: Principal | ICD-10-CM | POA: Diagnosis present

## 2024-01-29 DIAGNOSIS — R059 Cough, unspecified: Secondary | ICD-10-CM | POA: Diagnosis not present

## 2024-01-29 DIAGNOSIS — I679 Cerebrovascular disease, unspecified: Secondary | ICD-10-CM | POA: Diagnosis not present

## 2024-01-29 DIAGNOSIS — R1310 Dysphagia, unspecified: Secondary | ICD-10-CM | POA: Diagnosis not present

## 2024-01-29 DIAGNOSIS — J9601 Acute respiratory failure with hypoxia: Secondary | ICD-10-CM | POA: Diagnosis not present

## 2024-01-29 DIAGNOSIS — R269 Unspecified abnormalities of gait and mobility: Secondary | ICD-10-CM | POA: Diagnosis not present

## 2024-01-29 DIAGNOSIS — I130 Hypertensive heart and chronic kidney disease with heart failure and stage 1 through stage 4 chronic kidney disease, or unspecified chronic kidney disease: Secondary | ICD-10-CM | POA: Diagnosis present

## 2024-01-29 DIAGNOSIS — R1111 Vomiting without nausea: Secondary | ICD-10-CM | POA: Diagnosis not present

## 2024-01-29 LAB — CBC
HCT: 37.4 % — ABNORMAL LOW (ref 39.0–52.0)
Hemoglobin: 11.9 g/dL — ABNORMAL LOW (ref 13.0–17.0)
MCH: 30.7 pg (ref 26.0–34.0)
MCHC: 31.8 g/dL (ref 30.0–36.0)
MCV: 96.4 fL (ref 80.0–100.0)
Platelets: 323 10*3/uL (ref 150–400)
RBC: 3.88 MIL/uL — ABNORMAL LOW (ref 4.22–5.81)
RDW: 13.5 % (ref 11.5–15.5)
WBC: 18.6 10*3/uL — ABNORMAL HIGH (ref 4.0–10.5)
nRBC: 0 % (ref 0.0–0.2)

## 2024-01-29 LAB — CBC WITH DIFFERENTIAL/PLATELET
Abs Immature Granulocytes: 0.07 10*3/uL (ref 0.00–0.07)
Basophils Absolute: 0 10*3/uL (ref 0.0–0.1)
Basophils Relative: 0 %
Eosinophils Absolute: 0 10*3/uL (ref 0.0–0.5)
Eosinophils Relative: 0 %
HCT: 41.8 % (ref 39.0–52.0)
Hemoglobin: 13.4 g/dL (ref 13.0–17.0)
Immature Granulocytes: 0 %
Lymphocytes Relative: 9 %
Lymphs Abs: 1.7 10*3/uL (ref 0.7–4.0)
MCH: 30.9 pg (ref 26.0–34.0)
MCHC: 32.1 g/dL (ref 30.0–36.0)
MCV: 96.3 fL (ref 80.0–100.0)
Monocytes Absolute: 1.2 10*3/uL — ABNORMAL HIGH (ref 0.1–1.0)
Monocytes Relative: 6 %
Neutro Abs: 16.1 10*3/uL — ABNORMAL HIGH (ref 1.7–7.7)
Neutrophils Relative %: 85 %
Platelets: 358 10*3/uL (ref 150–400)
RBC: 4.34 MIL/uL (ref 4.22–5.81)
RDW: 13.3 % (ref 11.5–15.5)
WBC: 19.1 10*3/uL — ABNORMAL HIGH (ref 4.0–10.5)
nRBC: 0 % (ref 0.0–0.2)

## 2024-01-29 LAB — COMPREHENSIVE METABOLIC PANEL WITH GFR
ALT: 20 U/L (ref 0–44)
AST: 27 U/L (ref 15–41)
Albumin: 3.7 g/dL (ref 3.5–5.0)
Alkaline Phosphatase: 77 U/L (ref 38–126)
Anion gap: 13 (ref 5–15)
BUN: 29 mg/dL — ABNORMAL HIGH (ref 8–23)
CO2: 22 mmol/L (ref 22–32)
Calcium: 9.3 mg/dL (ref 8.9–10.3)
Chloride: 106 mmol/L (ref 98–111)
Creatinine, Ser: 1.26 mg/dL — ABNORMAL HIGH (ref 0.61–1.24)
GFR, Estimated: 55 mL/min — ABNORMAL LOW (ref >60)
Glucose, Bld: 112 mg/dL — ABNORMAL HIGH (ref 70–99)
Potassium: 4 mmol/L (ref 3.5–5.1)
Sodium: 141 mmol/L (ref 135–145)
Total Bilirubin: 0.6 mg/dL (ref 0.0–1.2)
Total Protein: 8.1 g/dL (ref 6.5–8.1)

## 2024-01-29 LAB — CREATININE, SERUM
Creatinine, Ser: 1.17 mg/dL (ref 0.61–1.24)
GFR, Estimated: 60 mL/min — ABNORMAL LOW (ref >60)

## 2024-01-29 LAB — I-STAT CG4 LACTIC ACID, ED
Lactic Acid, Venous: 1.4 mmol/L (ref 0.5–1.9)
Lactic Acid, Venous: 1.3 mmol/L (ref 0.5–1.9)

## 2024-01-29 MED ORDER — SODIUM CHLORIDE 0.9 % IV SOLN
1.5000 g | Freq: Three times a day (TID) | INTRAVENOUS | Status: DC
  Filled 2024-01-29: qty 4

## 2024-01-29 MED ORDER — CYCLOSPORINE 0.05 % OP EMUL
1.0000 [drp] | Freq: Two times a day (BID) | OPHTHALMIC | Status: DC
  Administered 2024-01-30 – 2024-02-04 (×10): 1 [drp] via OPHTHALMIC
  Filled 2024-01-29 (×14): qty 30

## 2024-01-29 MED ORDER — SODIUM CHLORIDE 0.9 % IV SOLN
1.0000 g | Freq: Once | INTRAVENOUS | Status: AC
  Administered 2024-01-29: 1 g via INTRAVENOUS
  Filled 2024-01-29: qty 10

## 2024-01-29 MED ORDER — HEPARIN SODIUM (PORCINE) 5000 UNIT/ML IJ SOLN
5000.0000 [IU] | Freq: Three times a day (TID) | INTRAMUSCULAR | Status: DC
  Administered 2024-01-30 – 2024-02-04 (×16): 5000 [IU] via SUBCUTANEOUS
  Filled 2024-01-29 (×16): qty 1

## 2024-01-29 MED ORDER — ONDANSETRON HCL 4 MG PO TABS
4.0000 mg | ORAL_TABLET | Freq: Four times a day (QID) | ORAL | Status: AC | PRN
Start: 2024-01-29 — End: ?

## 2024-01-29 MED ORDER — METRONIDAZOLE 500 MG/100ML IV SOLN
500.0000 mg | Freq: Two times a day (BID) | INTRAVENOUS | Status: DC
  Administered 2024-01-29: 500 mg via INTRAVENOUS
  Filled 2024-01-29: qty 100

## 2024-01-29 MED ORDER — SODIUM CHLORIDE 0.9 % IV SOLN
3.0000 g | Freq: Three times a day (TID) | INTRAVENOUS | Status: DC
  Administered 2024-01-30 – 2024-02-03 (×13): 3 g via INTRAVENOUS
  Filled 2024-01-29 (×13): qty 8

## 2024-01-29 MED ORDER — SODIUM CHLORIDE 0.9 % IV SOLN
INTRAVENOUS | Status: DC
Start: 2024-01-29 — End: 2024-01-29

## 2024-01-29 MED ORDER — MORPHINE SULFATE (PF) 2 MG/ML IV SOLN: 2.0000 mg | INTRAVENOUS | Status: DC | PRN

## 2024-01-29 MED ORDER — SODIUM CHLORIDE 0.9 % IV SOLN: INTRAVENOUS | Status: DC

## 2024-01-29 MED ORDER — LATANOPROST 0.005 % OP SOLN
1.0000 [drp] | Freq: Every day | OPHTHALMIC | Status: DC
  Administered 2024-01-30 – 2024-02-03 (×5): 1 [drp] via OPHTHALMIC
  Filled 2024-01-29: qty 2.5

## 2024-01-29 MED ORDER — ONDANSETRON HCL 4 MG/2ML IJ SOLN: 4.0000 mg | Freq: Four times a day (QID) | INTRAMUSCULAR | Status: DC | PRN

## 2024-01-29 MED ORDER — SODIUM CHLORIDE 0.9 % IV SOLN
500.0000 mg | Freq: Once | INTRAVENOUS | Status: AC
  Administered 2024-01-29: 500 mg via INTRAVENOUS
  Filled 2024-01-29: qty 5

## 2024-01-29 NOTE — H&P (Signed)
 History and Physical    Clarence Myers VHQ:[id number] DOB: 1934-04-18 DOA: 01/29/2024  PCP: Glena Landau, MD  Patient coming from: home  I have personally briefly reviewed patient's old medical records in [facility]  Chief Complaint:acute aspiration even with hypoxemia  HPI: Clarence Myers is a 89 y.o. male with medical history significant of  hypertension, hyperlipidemia, CVAx2 (2025), GERD, CAD s/p angioplasty1992, CKD 2, chronic diastolic CHF, anxiety presenting after aspiration event.  Patient of note also has interim history recent admission 5/23-/5/27 for similar event.  Patient was at home today working with speech therapy and was given water and cookie and had significant aspiration event. Patient per family has these episodes at home intermittently. However this event patient has significant vigorous coughing with possible episode of associated  emesis. Patient there after noted to have significant sob and EMS was called. On EMS arrival patient satting 80% on RA, he was then placedon 6L [place] and transported to ED. Patient notes no fever but notes persistent cough.  ED Course:  In ED attempt was made to wean O2 but patient off O2 on ra had desaturation to the mid 80's patient was placed back on [place] and now has stable sat in the 90's on 3L. Of note Cxr completed noted no new finding of airway obstruction   Vitals: Afeb,  BP 124/74, hr 92, rr 18, sat 97%  Wbc 19.1, hgb 13.4, plt 358 Na 141 k 4, cr1.26 ( 1.05) UXL:[id number]: Infectious/inflammatory reticulonodular opacities in the left greater than right lung bases, slightly improved compared to 01/10/2024. EKG:nsr, LAD Lactic 1.4  Tx ctx iv azithromycin Review of Systems: As per HPI otherwise 10 point review of systems negative.   Past Medical History:  Diagnosis Date   Acute ischemic stroke (HCC) 12/30/2021   CAD (coronary artery disease)    CVA (cerebral vascular accident) (HCC) 09/29/2021   Hyperlipidemia     Hypokalemia 09/29/2021   Low back strain    Shoulder injury    Fell and injured Right shoulder   Stroke [facility])     Past Surgical History:  Procedure Laterality Date   ANGIOPLASTY     1992-LAD   CARDIAC CATHETERIZATION       reports that he has never smoked. He has never used smokeless tobacco. He reports that he does not drink alcohol and does not use drugs.  Allergies  Allergen Reactions   Bee Venom Anaphylaxis and Swelling   Blue Crab (Cavinectes Sapidus) Allergy Skin Test Hives, Swelling, Other (See Comments) and Cough   Shellfish-Derived Products Hives    No family history on file.  Prior to Admission medications   Medication Sig Start Date End Date Taking? Authorizing Provider  acetaminophen  (TYLENOL ) 325 MG tablet Take 2 tablets (650 mg total) by mouth every 4 (four) hours as needed for mild pain (or temp > 37.5 C (99.5 F)). 10/09/21  Yes Abbe Abate, MD  clopidogrel  (PLAVIX ) 75 MG tablet Take 1 tablet (75 mg total) by mouth daily. RESUME ONLY AFTER COMPLETION OF 30 DAYS OF ASPIRIN  AND BRILINTA  Patient taking differently: Take 75 mg by mouth daily. 01/31/22  Yes Krishnan, Gokul, MD  latanoprost  (XALATAN ) 0.005 % ophthalmic solution Place 1 drop into both eyes at bedtime. 01/14/24  Yes Samtani, Jai-Gurmukh, MD  metoprolol  succinate (TOPROL  XL) 25 MG 24 hr tablet Take 0.5 tablets (12.5 mg total) by mouth daily. 01/14/24  Yes Samtani, Jai-Gurmukh, MD  mirtazapine  (REMERON ) 15 MG tablet Take 15 mg by mouth  at bedtime. 11/13/21  Yes [provider]  Multiple Vitamin (MULTI-VITAMIN PO) Take 30 mLs by mouth daily with breakfast.   Yes [provider]  multivitamin-lutein  (OCUVITE-LUTEIN ) CAPS Take 1 capsule by mouth daily.     Yes [provider]  Omega-3 Fatty Acids (FISH OIL PO) Take 1,000 mg by mouth in the morning and at bedtime.   Yes [provider]  omeprazole (PRILOSEC) 20 MG capsule Take 20 mg by mouth daily before breakfast. 01/21/21   Yes [provider]  Probiotic Product (PROBIOTIC DAILY PO) Take 1 capsule by mouth daily.   Yes [provider]  RESTASIS  0.05 % ophthalmic emulsion Place 1 drop into both eyes 2 (two) times daily. 10/25/20  Yes [provider]  rosuvastatin  (CRESTOR ) 20 MG tablet Take 1 tablet (20 mg total) by mouth daily. Patient taking differently: Take 20 mg by mouth at bedtime. 12/31/21  Yes Krishnan, Gokul, MD  saccharomyces boulardii (FLORASTOR) 250 MG capsule Take 1 capsule by mouth daily.   Yes [provider]  sertraline  (ZOLOFT ) 25 MG tablet Take 2 tablets (50 mg total) by mouth at bedtime. 01/14/24  Yes Verlie Glisson, MD    Physical Exam: Vitals:   01/29/24 1922  BP: 124/74  Pulse: 92  Resp: 18  Temp: 98.2 F (36.8 C)  TempSrc: Oral  SpO2: 97%    Constitutional: NAD, calm, comfortable Vitals:   01/29/24 1922  BP: 124/74  Pulse: 92  Resp: 18  Temp: 98.2 F (36.8 C)  TempSrc: Oral  SpO2: 97%   Eyes: PERRL, lids and conjunctivae normal ENMT: Mucous membranes are moist. Posterior pharynx clear of any exudate or lesions.Normal dentition.  Neck: normal, supple, no masses, no thyromegaly Respiratory: course bs occasional rhonchi, no wheezing,s. Normal respiratory effort. No accessory muscle use.  Cardiovascular: Regular rate and rhythm, no murmurs / rubs / gallops. No extremity edema. 2+ pedal pulses. Abdomen: no tenderness, no masses palpated. No hepatosplenomegaly. Bowel sounds positive.  Musculoskeletal: no clubbing / cyanosis. No joint deformity upper and lower extremities. Good ROM, no contractures. Normal muscle tone.  Skin: no rashes, lesions, ulcers. No induration Neurologic: CN 2-12 grossly intact. Sensation intact,. Strength 5/5 in all 4.  Psychiatric: Normal judgment and insight. Alert and oriented x 3. Normal mood.    Labs on Admission: I have personally reviewed following labs and imaging studies  CBC: Recent Labs  Lab  01/29/24 1944  WBC 19.1*  NEUTROABS 16.1*  HGB 13.4  HCT 41.8  MCV 96.3  PLT 358   Basic Metabolic Panel: Recent Labs  Lab 01/29/24 1944  NA 141  K 4.0  CL 106  CO2 22  GLUCOSE 112*  BUN 29*  CREATININE 1.26*  CALCIUM  9.3   GFR: CrCl cannot be calculated (Unknown ideal weight.). Liver Function Tests: Recent Labs  Lab 01/29/24 1944  AST 27  ALT 20  ALKPHOS 77  BILITOT 0.6  PROT 8.1  ALBUMIN 3.7   No results for input(s): LIPASE, AMYLASE in the last 168 hours. No results for input(s): AMMONIA in the last 168 hours. Coagulation Profile: No results for input(s): INR, PROTIME in the last 168 hours. Cardiac Enzymes: No results for input(s): CKTOTAL, CKMB, CKMBINDEX, TROPONINI in the last 168 hours. BNP (last 3 results) No results for input(s): PROBNP in the last 8760 hours. HbA1C: No results for input(s): HGBA1C in the last 72 hours. CBG: No results for input(s): GLUCAP in the last 168 hours. Lipid Profile: No results for input(s): CHOL,  HDL, LDLCALC, TRIG, CHOLHDL, LDLDIRECT in the last 72 hours. Thyroid  Function Tests: No results for input(s): TSH, T4TOTAL, FREET4, T3FREE, THYROIDAB in the last 72 hours. Anemia Panel: No results for input(s): VITAMINB12, FOLATE, FERRITIN, TIBC, IRON, RETICCTPCT in the last 72 hours. Urine analysis:    Component Value Date/Time   COLORURINE YELLOW 12/29/2021 2002   APPEARANCEUR CLEAR 12/29/2021 2002   LABSPEC 1.009 12/29/2021 2002   PHURINE 5.0 12/29/2021 2002   GLUCOSEU NEGATIVE 12/29/2021 2002   HGBUR NEGATIVE 12/29/2021 2002   BILIRUBINUR NEGATIVE 12/29/2021 2002   KETONESUR NEGATIVE 12/29/2021 2002   PROTEINUR NEGATIVE 12/29/2021 2002   UROBILINOGEN 0.2 09/02/2007 1611   NITRITE NEGATIVE 12/29/2021 2002   LEUKOCYTESUR NEGATIVE 12/29/2021 2002    Radiological Exams on Admission: DG Chest Port 1 View Result Date: 01/29/2024 CLINICAL DATA:  Cough and  shortness of breath EXAM: PORTABLE CHEST 1 VIEW COMPARISON:  01/10/2024 FINDINGS: Stable cardiomediastinal silhouette. Aortic atherosclerotic calcification. Reticulonodular opacities in the left greater than right lung bases. These have slightly improved compared to 01/10/2019. No pleural effusion or pneumothorax. Irregular sclerotic lesion in the proximal right humerus has a benign appearance and may represent an enchondroma. IMPRESSION: Infectious/inflammatory reticulonodular opacities in the left greater than right lung bases, slightly improved compared to 01/10/2024. Electronically Signed   By: Rozell Cornet M.D.   On: 01/29/2024 20:16    EKG: Independently reviewed. See above  Assessment/Plan  1)Aspiration Event with Concern  Aspiration pneumonitis  2)Acute hypoxic respiratory failure due to 1 -admit to progressive care  -npo  -speech consult once patient is stable  - supportive ivfs while npo - Unasyn / Zithromax , de-escalate as able  - pulmonary toilet    Essential Hypertension  -stable  - prn iv  -holding all medication due  to concern for aspiration   Hx of CVA x 2  - note right sided residual deficits  - holding secondary ppx due to aspiration concern   HLD -holding fish oil due to concern for aspiration    GERD -ppi   CAD s/p angioplasty1992 -resume cardiac meds as able s/p speech evaluation   CKD 2 -mild dehydration noted -gentle ivfs   Chronic diastolic CHF -compensated -not on diuretic therapy   Anxiety -no acute exacerbation   FEN - consult GI in am , recurrent episodes of aspiration  - most likely will need peg placement  while he is treated for his dysphagia  DVT prophylaxis: heparin  Code Status: full/ as discussed per patient wishes in event of cardiac arrest  Family Communication:  none at bedside Disposition Plan: patient  expected to be admitted greater than 2 midnights  Consults called: GI consult  Admission status: progressive  care   Sabas Cradle MD [facility]   If 7PM-7AM, please contact night-coverage [email or web address] Password [facility]  01/29/2024, 9:24 PM

## 2024-01-29 NOTE — ED Provider Notes (Addendum)
 [place] EMERGENCY DEPARTMENT AT [facility] Provider Note   CSN: [id number] Arrival date & time: 01/29/24  1911     History  Chief Complaint  Patient presents with   Aspiration        Emesis    Clarence Myers is a 89 y.o. male.  Patient brought in by EMS.  After choking on a cookie and having about 3-4 emesis episodes.  Patient still doing coughing.  On EMS arrival patient was 80% on room air placed on 6 L nasal cannula.  Patient's blood sugar was 125 on 6 L oxygen sats were 96%.  Heart rate 95 and blood pressure 152/76.  Patient recently was admitted May 23 to May 27.  Main problem for the admission was aspiration pneumonia.  His past history significant for hypertension hyperlipidemia coronary artery disease with angioplasty 92 chronic kidney disease CVA x 2 recent acute ischemic stroke was in February 2025.  At that admission patient was apparently drinking water when he choked did not have any food scheduled only water seems to have this happen.  Lives with wife at home gets assistant usually uses a walker.  Patient with residual right sided weakness following the stroke.  Patient needs assistance using his walker to get around.  Pretty much has 24/7 assistance.  Patient was actually with the speech therapist.  They were checking some swallowing and I gave him some water and then a cookie and then he started choking and coughing they say 3-4 episodes of emesis but he still got a persistent cough.  Not certain whether he really brought anything up.  He certainly not spitting out saliva at this point in time.       Home Medications Prior to Admission medications   Medication Sig Start Date End Date Taking? Authorizing Provider  acetaminophen  (TYLENOL ) 325 MG tablet Take 2 tablets (650 mg total) by mouth every 4 (four) hours as needed for mild pain (or temp > 37.5 C (99.5 F)). 10/09/21   Abbe Abate, MD  clopidogrel  (PLAVIX ) 75 MG tablet Take 1 tablet (75 mg  total) by mouth daily. RESUME ONLY AFTER COMPLETION OF 30 DAYS OF ASPIRIN  AND BRILINTA  Patient taking differently: Take 75 mg by mouth daily. 01/31/22   Krishnan, Gokul, MD  latanoprost  (XALATAN ) 0.005 % ophthalmic solution Place 1 drop into both eyes at bedtime. 01/14/24   Samtani, Jai-Gurmukh, MD  metoprolol  succinate (TOPROL  XL) 25 MG 24 hr tablet Take 0.5 tablets (12.5 mg total) by mouth daily. 01/14/24   Samtani, Jai-Gurmukh, MD  mirtazapine  (REMERON ) 15 MG tablet Take 15 mg by mouth at bedtime. 11/13/21   [provider]  Multiple Vitamin (MULTI-VITAMIN PO) Take 30 mLs by mouth daily with breakfast.    [provider]  multivitamin-lutein  (OCUVITE-LUTEIN ) CAPS Take 1 capsule by mouth daily.      [provider]  Omega-3 Fatty Acids (FISH OIL PO) Take 1,000 mg by mouth in the morning and at bedtime.    [provider]  omeprazole (PRILOSEC) 20 MG capsule Take 20 mg by mouth daily before breakfast. 01/21/21   [provider]  Probiotic Product (PROBIOTIC DAILY PO) Take 1 capsule by mouth daily.    [provider]  RESTASIS  0.05 % ophthalmic emulsion Place 1 drop into both eyes 2 (two) times daily. 10/25/20   [provider]  rosuvastatin  (CRESTOR ) 20 MG tablet Take 1 tablet (20 mg total) by mouth daily. Patient taking differently: Take 20 mg by  mouth at bedtime. 12/31/21   Krishnan, Gokul, MD  sertraline  (ZOLOFT ) 25 MG tablet Take 2 tablets (50 mg total) by mouth at bedtime. 01/14/24   Samtani, Jai-Gurmukh, MD      Allergies    Bee venom, Blue crab (cavinectes sapidus) allergy skin test, and Shellfish-derived products    Review of Systems   Review of Systems  Constitutional:  Negative for chills and fever.  HENT:  Negative for ear pain and sore throat.   Eyes:  Negative for pain and visual disturbance.  Respiratory:  Positive for cough and shortness of breath.   Cardiovascular:  Negative for chest pain and palpitations.   Gastrointestinal:  Negative for abdominal pain and vomiting.  Genitourinary:  Negative for dysuria and hematuria.  Musculoskeletal:  Negative for arthralgias and back pain.  Skin:  Negative for color change and rash.  Neurological:  Negative for seizures and syncope.  All other systems reviewed and are negative.   Physical Exam Updated Vital Signs BP 124/74 (BP Location: Left Arm)   Pulse 92   Temp 98.2 F (36.8 C) (Oral)   Resp 18   SpO2 97%  Physical Exam Vitals and nursing note reviewed.  Constitutional:      General: He is not in acute distress.    Appearance: Normal appearance. He is well-developed.  HENT:     Head: Normocephalic and atraumatic.     Mouth/Throat:     Mouth: Mucous membranes are moist.  Eyes:     Extraocular Movements: Extraocular movements intact.     Conjunctiva/sclera: Conjunctivae normal.     Pupils: Pupils are equal, round, and reactive to light.  Cardiovascular:     Rate and Rhythm: Normal rate and regular rhythm.     Heart sounds: No murmur heard. Pulmonary:     Effort: Pulmonary effort is normal. No respiratory distress.     Breath sounds: Rhonchi present.  Abdominal:     Palpations: Abdomen is soft.     Tenderness: There is no abdominal tenderness.  Musculoskeletal:        General: No swelling.     Cervical back: Neck supple.  Skin:    General: Skin is warm and dry.     Capillary Refill: Capillary refill takes less than 2 seconds.  Neurological:     Mental Status: He is alert. Mental status is at baseline.     Comments: Residual right-sided weakness.  Psychiatric:        Mood and Affect: Mood normal.     ED Results / Procedures / Treatments   Labs (all labs ordered are listed, but only abnormal results are displayed) Labs Reviewed  CULTURE, BLOOD (ROUTINE X 2)  CULTURE, BLOOD (ROUTINE X 2)  CBC WITH DIFFERENTIAL/PLATELET  COMPREHENSIVE METABOLIC PANEL WITH GFR  I-STAT CG4 LACTIC ACID, ED    EKG None  Radiology No  results found.  Procedures Procedures    Medications Ordered in ED Medications - No data to display  ED Course/ Medical Decision Making/ A&P                                 Medical Decision Making Amount and/or Complexity of Data Reviewed Labs: ordered. Radiology: ordered.  Risk Prescription drug management. Decision regarding hospitalization.   Concern would be for aspiration.  Will do workup based on that.  Patient certainly is set up.  Apparently during last hospitalization they did barium swallow.  He  is always passed all of his swallowing studies.  But family states that he has had kind of a choking coughing episode like this multiple times.  Always associated with food or liquid.  Patient is oxygen saturations did go down to 86% on room air.  3 L.  Patient will require admission for workup.  Patient's lactic acid reassuring at 1.5 CBC white count 19,000 hemoglobin 13.4.  Complete metabolic panel creatinine 1.26 but GFR is 55.  Otherwise LFTs and electrolytes normal.  Chest x-ray infectious inflammatory right nodule opacities in the left greater than right lung base slightly improved compared to 523.  But clinically still waxes if he had an aspiration he is at risk for pneumonia.  Certainly has hypoxia.  No concerns clinically for pulmonary embolus.  Will contact hospitalist for admission.  Discussed with the hospitalist Dr. Andy Bannister she will admit.   Final Clinical Impression(s) / ED Diagnoses Final diagnoses:  Aspiration into airway, initial encounter    Rx / DC Orders ED Discharge Orders     None         Nicklas Barns, MD 01/29/24 Lola Rio, MD 01/29/24 Ashley Blades, MD 01/29/24 3875    Nicklas Barns, MD 01/29/24 2124

## 2024-01-29 NOTE — ED Triage Notes (Signed)
 Pt presents to ED from home via EMS after chocking on cookie and having 3-4 emesis episodes. Pt states this has happened 3 times. On EMS arrival pt was 80% on RA. EMS placed pt on 6L [place]. Pt A&O x 4 at the time of triage.   152/76 HR: 95 97% 6L [place]  CBG 125

## 2024-01-30 ENCOUNTER — Encounter (HOSPITAL_COMMUNITY): Payer: Self-pay | Admitting: Internal Medicine

## 2024-01-30 DIAGNOSIS — J9601 Acute respiratory failure with hypoxia: Secondary | ICD-10-CM

## 2024-01-30 DIAGNOSIS — J690 Pneumonitis due to inhalation of food and vomit: Secondary | ICD-10-CM

## 2024-01-30 LAB — CBC
HCT: 34.8 % — ABNORMAL LOW (ref 39.0–52.0)
Hemoglobin: 11.2 g/dL — ABNORMAL LOW (ref 13.0–17.0)
MCH: 31.3 pg (ref 26.0–34.0)
MCHC: 32.2 g/dL (ref 30.0–36.0)
MCV: 97.2 fL (ref 80.0–100.0)
Platelets: 315 10*3/uL (ref 150–400)
RBC: 3.58 MIL/uL — ABNORMAL LOW (ref 4.22–5.81)
RDW: 13.5 % (ref 11.5–15.5)
WBC: 22.5 10*3/uL — ABNORMAL HIGH (ref 4.0–10.5)
nRBC: 0 % (ref 0.0–0.2)

## 2024-01-30 LAB — COMPREHENSIVE METABOLIC PANEL WITH GFR
ALT: 16 U/L (ref 0–44)
AST: 22 U/L (ref 15–41)
Albumin: 3 g/dL — ABNORMAL LOW (ref 3.5–5.0)
Alkaline Phosphatase: 57 U/L (ref 38–126)
Anion gap: 10 (ref 5–15)
BUN: 31 mg/dL — ABNORMAL HIGH (ref 8–23)
CO2: 22 mmol/L (ref 22–32)
Calcium: 8.4 mg/dL — ABNORMAL LOW (ref 8.9–10.3)
Chloride: 107 mmol/L (ref 98–111)
Creatinine, Ser: 1.12 mg/dL (ref 0.61–1.24)
GFR, Estimated: >60 mL/min (ref >60)
Glucose, Bld: 154 mg/dL — ABNORMAL HIGH (ref 70–99)
Potassium: 4.3 mmol/L (ref 3.5–5.1)
Sodium: 139 mmol/L (ref 135–145)
Total Bilirubin: 1 mg/dL (ref 0.0–1.2)
Total Protein: 6.8 g/dL (ref 6.5–8.1)

## 2024-01-30 MED ORDER — IPRATROPIUM-ALBUTEROL 0.5-2.5 (3) MG/3ML IN SOLN
3.0000 mL | Freq: Three times a day (TID) | RESPIRATORY_TRACT | Status: DC
  Administered 2024-01-30 – 2024-02-03 (×11): 3 mL via RESPIRATORY_TRACT
  Filled 2024-01-30 (×13): qty 3

## 2024-01-30 MED ORDER — IPRATROPIUM-ALBUTEROL 0.5-2.5 (3) MG/3ML IN SOLN
3.0000 mL | Freq: Four times a day (QID) | RESPIRATORY_TRACT | Status: DC
  Administered 2024-01-30: 3 mL via RESPIRATORY_TRACT
  Filled 2024-01-30: qty 3

## 2024-01-30 NOTE — Progress Notes (Signed)
 Progress Note   Patient: Clarence Myers:[id number] DOB: October 13, 1933 DOA: 01/29/2024  DOS: the patient was seen and examined on 01/30/2024   Brief hospital course:  89 y.o. male with medical history significant of  hypertension, hyperlipidemia, CVAx2 (2025), GERD, CAD s/p angioplasty1992, CKD 2, chronic diastolic CHF, anxiety presenting after aspiration event.  Patient of note also has interim history recent admission 5/23-/5/27 for similar event.  Patient was at home today working with speech therapy and was given water and cookie and had significant aspiration event. Patient per family has these episodes at home intermittently. However this event patient has significant vigorous coughing with possible episode of associated  emesis. Patient there after noted to have significant sob and EMS was called. On EMS arrival patient satting 80% on RA, he was then placedon 6L  and transported to ED. Patient notes no fever but notes persistent cough.  Assessment and Plan:  Acute hypoxic respiratory failure - Initially requiring 6 L nasal cannula, now weaned down to 3 L.  Will continue to wean O2 as tolerated.  Acute aspiration pneumonitis - Recent admission for similar.  Aspiration with feeding during home health speech eval.  Supplemental O2 as above.  Empiric Unasyn  plus azithromycin.  Will reconsult speech/swallow.  Currently NPO.  History of CVA - Noted chronic right-sided deficits.  Likely contributing factor to patient's continued aspirations.  CAD - Will resume cardiac meds once able to tolerate p.o.  Hypertension/hyperlipidemia/GERD - Resume home medication regimen  Goals of care - Patient with recurrent aspirations with feeding.  Discussed possibility of PEG tube if adequate nutritional intake becomes a concern but did reinforce that it does not lower her risk of aspiration or pneumonia.  Subjective: Patient resting comfortably this morning.  Currently on 3 L nasal cannula.  Mild  cough.  Denies any fever, chills, chest pain, nausea, vomiting, abdominal pain.  Physical Exam:  Vitals:   01/30/24 0615 01/30/24 0645 01/30/24 0947 01/30/24 1229  BP: (!) 100/56 (!) 102/53 (!) 96/49 92/76  Pulse: 72 75 68 89  Resp: (!) 27 (!) 34 (!) 27 (!) 24  Temp:   (!) 97.5 F (36.4 C) 98.3 F (36.8 C)  TempSrc:   Oral Oral  SpO2: 97% 95% 96% 93%  Weight:        GENERAL:  Alert, pleasant, no acute distress  HEENT:  EOMI, nasal cannula CARDIOVASCULAR:  RRR, no murmurs appreciated RESPIRATORY: Coarse cough, poor air movement bilaterally  GASTROINTESTINAL:  Soft, nontender, nondistended EXTREMITIES:  No LE edema bilaterally NEURO:  No new focal deficits appreciated SKIN:  No rashes noted PSYCH:  Appropriate mood and affect     Data Reviewed:  There are no new results to review at this time.  Previous records (including but not limited to H&P, progress notes, nursing notes, TOC management) were reviewed in assessment of this patient.  Labs: CBC: Recent Labs  Lab 01/29/24 1944 01/29/24 2239 01/30/24 0530  WBC 19.1* 18.6* 22.5*  NEUTROABS 16.1*  --   --   HGB 13.4 11.9* 11.2*  HCT 41.8 37.4* 34.8*  MCV 96.3 96.4 97.2  PLT 358 323 315   Basic Metabolic Panel: Recent Labs  Lab 01/29/24 1944 01/29/24 2239 01/30/24 0530  NA 141  --  139  K 4.0  --  4.3  CL 106  --  107  CO2 22  --  22  GLUCOSE 112*  --  154*  BUN 29*  --  31*  CREATININE 1.26* 1.17 1.12  CALCIUM  9.3  --  8.4*   Liver Function Tests: Recent Labs  Lab 01/29/24 1944 01/30/24 0530  AST 27 22  ALT 20 16  ALKPHOS 77 57  BILITOT 0.6 1.0  PROT 8.1 6.8  ALBUMIN 3.7 3.0*   CBG: No results for input(s): GLUCAP in the last 168 hours.  Scheduled Meds:  cycloSPORINE   1 drop Both Eyes BID   heparin   5,000 Units Subcutaneous Q8H   latanoprost   1 drop Both Eyes QHS   Continuous Infusions:  sodium chloride  75 mL/hr at 01/29/24 2224   ampicillin -sulbactam (UNASYN ) IV Stopped (01/30/24  0530)   PRN Meds:.morphine injection, ondansetron  **OR** ondansetron  (ZOFRAN ) IV  Family Communication: Family at bedside  Disposition: Status is: Inpatient Remains inpatient appropriate because: Acute hypoxic respiratory failure     Time spent: 38 minutes  Length of inpatient stay: 1 days  Author: Jodeane Mulligan, DO 01/30/2024 12:50 PM  For on call review [email or web address].

## 2024-01-30 NOTE — Progress Notes (Signed)
 Speech Language Pathology Treatment: Dysphagia  Patient Details Name: Clarence Myers MRN: [id number] DOB: 1934/01/17 Today's Date: 01/30/2024 Time: 9147-8295 SLP Time Calculation (min) (ACUTE ONLY): 35 min  Assessment / Plan / Recommendation Clinical Impression  Separate SLP session conducted with patient and family to discuss patient swallowing and recurrent aspiration episodes.  SLP educated them to multiple risk factors associated with aspiration pneumonia and precautions to mitigate aspiration risk.  Daughter, Clarence Myers, in the room and daughter, Clarence Myers, via phone included in conversation with the patient.  SLP mentioned that it may be beneficial to discuss patient's CODE STATUS with the MD given his advanced age, multiple comorbidities, functional decline and recurrent aspiration.  Encouraged them to discuss potential outcomes with a significant event happened requiring intubation and CPR.  Patient reports great pleasure in eating and reports aspiration episodes were isolated events.  He explains aspiration event with cookie occurred due to portion of cookie spilling from the mouth into his airway.  But then he next explains that he tried to wash the cookie down causing his choking episode.  SLP cued him to masticate his food thoroughly contained all food into a bolus and swallow at the same time to prevent premature loss into the pharynx.  His most recent MBS 12/2023 showed no aspiration with swallow triggering at the piriform sinus times.  Did optimal with oral hold and small boluses.  Larger boluses showed premature spillage and transient penetration.  Advised that barium given during modified barium swallow test is slightly thicker than thin water.  Thus SLP could see how it could be possible for patient to have some aspiration with thin water.  Pt may benefit from consuming slightly thicker drinks such as Ensure, orange juice, etc, at least during his meals to decrease episodic aspiration  risk.  Home health SLP and family report interest in patient undergoing esophagram.  At this time he does not medically appear appropriate for an esophagram as he becomes very quickly fatigued with few bites and sips.  Advised to wait half to drink enough barium to insufflate the esophagus for the test to provide accurate results.  If he medically improves during this hospital stay, esophagram could aid in ruling out significant esophageal issues contributing to aspiration risk.  Advised family that PEG tubes do not prevent aspiration, and they report no interest in alternative means of nutrition for this patient.  Will follow-up briefly for education and dysphagia management.  Encouraged use of incentive spirometer to decrease risk of ATX with pt not mobilizing much.      HPI HPI: Clarence Myers is a 89 y.o. male presenting after aspiration event.     Patient was reportedly drinking water earlier today and had a choking sensation and then coughed.  He did not have any food at this time.  No fever no shortness of breath.  He has had issues with aspiration/choking sensation in the past. MBS in 2023 after pontine stroke show Orally pt exhibited premature spill with thin into laryngeal vestibule. Mastication of solid and oral control and transit with thicker textures was adequate. Laryngeal penetration to the vocal cords before the swallow present with thin and once aspirated before the swallow. A compensatory strategy of volitional breath hold before the swallow was effective however he was unable to consistently perform without constant cueing. Chin tuck was ineffective to protect airway. There was minimal residue throughout in valleculae. Swallows with nectar thick were coordinated and safe. Recommend pt's liquids thickened to nectar consistency and  SLP at SNF work with pt on performing breath hold strategy prior to swallow for possible upgrade to thin at bedside. with medical history significant of  hypertension, hyperlipidemia, CVA, GERD, CAD, CKD 2, chronic diastolic CHF, anxiety      SLP Plan  Continue with current plan of care          Recommendations  Diet recommendations: Dysphagia 3 (mechanical soft);Thin liquid Liquids provided via: Cup;Straw Medication Administration: Whole meds with puree Supervision: Patient able to self feed;Full supervision/cueing for compensatory strategies Compensations: Minimize environmental distractions;Slow rate;Small sips/bites Postural Changes and/or Swallow Maneuvers: Seated upright 90 degrees;Upright 30-60 min after meal                OT consult   Oral care BID     Dysphagia, unspecified (R13.10)     Continue with current plan of care     Clarence Myers Comment  01/30/2024, 6:10 PM    Still pic from most recent MBS, ? If pt may have prominent PES - but this did not impair barium flow.   Maudie Sorrow, MS [facility] SLP Acute [employer] [phone]

## 2024-01-30 NOTE — Evaluation (Signed)
 Clinical/Bedside Swallow Evaluation Patient Details  Name: JOVAHN BREIT MRN: [id number] Date of Birth: [birth year]  Today's Date: 01/30/2024 Time: SLP Start Time (ACUTE ONLY): 1545 SLP Stop Time (ACUTE ONLY): 1610 SLP Time Calculation (min) (ACUTE ONLY): 25 min  Past Medical History:  Past Medical History:  Diagnosis Date   Acute ischemic stroke (HCC) 12/30/2021   CAD (coronary artery disease)    CVA (cerebral vascular accident) (HCC) 09/29/2021   Hyperlipidemia    Hypokalemia 09/29/2021   Low back strain    Shoulder injury    Fell and injured Right shoulder   Stroke [facility])    Past Surgical History:  Past Surgical History:  Procedure Laterality Date   ANGIOPLASTY     1992-LAD   CARDIAC CATHETERIZATION     HPI:  ARIA PICKRELL is a 89 y.o. male presenting after aspiration event.     Patient was reportedly drinking water earlier today and had a choking sensation and then coughed.  He did not have any food at this time.  No fever no shortness of breath.  He has had issues with aspiration/choking sensation in the past. MBS in 2023 after pontine stroke show Orally pt exhibited premature spill with thin into laryngeal vestibule. Mastication of solid and oral control and transit with thicker textures was adequate. Laryngeal penetration to the vocal cords before the swallow present with thin and once aspirated before the swallow. A compensatory strategy of volitional breath hold before the swallow was effective however he was unable to consistently perform without constant cueing. Chin tuck was ineffective to protect airway. There was minimal residue throughout in valleculae. Swallows with nectar thick were coordinated and safe. Recommend pt's liquids thickened to nectar consistency and SLP at SNF work with pt on performing breath hold strategy prior to swallow for possible upgrade to thin at bedside. with medical history significant of hypertension, hyperlipidemia, CVA, GERD, CAD, CKD 2,  chronic diastolic CHF, anxiety    Assessment / Plan / Recommendation  Clinical Impression  Patient is greeted with daughter, Antoine Bathe, in room.  He is on oxygen and slightly leaning to the right.  He presents with slight palatal deviation upon phonation to the right and slight left upper labial asymmetry. Voice appears with decreased strength with daughter reports to be progressing.  Reviewed MBS study fluoroscopy loops from 2 prior exams.  Patient observed feeding himself thin water via cup and straw, soda via cup and straw, orange juice via cup and straw, Ensure via straw, applesauce x2 boluses, Italian ice x 3 boluses and graham crackers moistened with applesauce.  Eructation noted post swallow of thin liquids but patient denies having active reflux with eructation episodes.  Patient's daughter endorses him having history of reflux for which he is taking Prilosec 20 mg at home (for years).  Subtle cough noted with larger bolus of water and [place] ice.  No clinical indication of aspiration/airway compromise or dysphagia observed with small single boluses.  Patient did appear to have some difficulties with bringing cup and spoon to his mouth and daughter endorses that has been an issue as of recent, along with decreased vocal strength and mobility.  At this time recommend patient resume p.o. diet of mechanical soft and thin with strict precautions including small single boluses.  Family agreeable to plan. SLP Visit Diagnosis: Dysphagia, unspecified (R13.10)    Aspiration Risk  Moderate aspiration risk    Diet Recommendation Dysphagia 3 (Mech soft);Thin liquid    Liquid Administration via: Cup;Straw Medication Administration:  Whole meds with puree Supervision: Patient able to self feed Compensations: Slow rate;Small sips/bites (start intake with liquids, SMALL SINGLE SIPS)    Other  Recommendations Oral Care Recommendations: Oral care BID     Assistance Recommended at Discharge  N/a  Functional  Status Assessment Patient has had a recent decline in their functional status and/or demonstrates limited ability to make significant improvements in function in a reasonable and predictable amount of time  Frequency and Duration min 1 x/week  1 week       Prognosis Prognosis for improved oropharyngeal function: Fair      Swallow Study   General Date of Onset: 01/30/24 HPI: QUSAY VILLADA is a 89 y.o. male presenting after aspiration event.     Patient was reportedly drinking water earlier today and had a choking sensation and then coughed.  He did not have any food at this time.  No fever no shortness of breath.  He has had issues with aspiration/choking sensation in the past. MBS in 2023 after pontine stroke show Orally pt exhibited premature spill with thin into laryngeal vestibule. Mastication of solid and oral control and transit with thicker textures was adequate. Laryngeal penetration to the vocal cords before the swallow present with thin and once aspirated before the swallow. A compensatory strategy of volitional breath hold before the swallow was effective however he was unable to consistently perform without constant cueing. Chin tuck was ineffective to protect airway. There was minimal residue throughout in valleculae. Swallows with nectar thick were coordinated and safe. Recommend pt's liquids thickened to nectar consistency and SLP at SNF work with pt on performing breath hold strategy prior to swallow for possible upgrade to thin at bedside. with medical history significant of hypertension, hyperlipidemia, CVA, GERD, CAD, CKD 2, chronic diastolic CHF, anxiety Type of Study: Bedside Swallow Evaluation Diet Prior to this Study: NPO Temperature Spikes Noted: No Respiratory Status: Nasal cannula History of Recent Intubation: No Behavior/Cognition: Alert;Cooperative;Pleasant mood Oral Cavity Assessment: Within Functional Limits Oral Care Completed by SLP: No Oral Cavity -  Dentition: Adequate natural dentition Vision: Functional for self-feeding Self-Feeding Abilities: Able to feed self Patient Positioning: Upright in bed;Other (comment) (pt tends to lean to the right) Baseline Vocal Quality: Low vocal intensity Volitional Cough: Strong Volitional Swallow: Unable to elicit (d/t xerostomia)    Oral/Motor/Sensory Function Overall Oral Motor/Sensory Function:  (left facial asymmetry, ? slight palatal deviation to the right upon phonation)   Ice Chips Ice chips: Not tested   Thin Liquid Thin Liquid: Within functional limits Presentation: Self Fed;Straw;Cup;Spoon    Nectar Thick Nectar Thick Liquid: Within functional limits Presentation: Self Fed;Straw   Honey Thick Honey Thick Liquid: Not tested   Puree Puree: Within functional limits Presentation: Self Fed;Spoon   Solid     Solid: Within functional limits Presentation: Self Lillie Reining 01/30/2024,6:02 PM  Maudie Sorrow, MS [facility] SLP Acute Rehab Services Office [phone]

## 2024-01-30 NOTE — ED Notes (Signed)
 [facility] called.

## 2024-01-30 NOTE — Hospital Course (Signed)
 89 y.o. male with medical history significant of  hypertension, hyperlipidemia, CVAx2 (2025), GERD, CAD s/p angioplasty1992, CKD 2, chronic diastolic CHF, anxiety presenting after aspiration event.  Patient of note also has interim history recent admission 5/23-/5/27 for similar event.  Patient was at home today working with speech therapy and was given water and cookie and had significant aspiration event. Patient per family has these episodes at home intermittently. However this event patient has significant vigorous coughing with possible episode of associated  emesis. Patient there after noted to have significant sob and EMS was called. On EMS arrival patient satting 80% on RA, he was then placedon 6L  and transported to ED. Patient notes no fever but notes persistent cough.   Assessment and Plan:   Acute hypoxic respiratory failure - Initially requiring 6 L nasal cannula, now weaned down to 3 L.  Will continue to wean O2 as tolerated.   Acute aspiration pneumonitis - Recent admission for similar.  Aspiration with feeding during home health speech eval.  Supplemental O2 as above.  Empiric Unasyn  plus azithromycin.  Will reconsult speech/swallow.  Currently NPO.   History of CVA - Noted chronic right-sided deficits.  Likely contributing factor to patient's continued aspirations.   CAD - Will resume cardiac meds once able to tolerate p.o.   Hypertension/hyperlipidemia/GERD - Resume home medication regimen   Goals of care - Patient with recurrent aspirations with feeding.  Discussed possibility of PEG tube if adequate nutritional intake becomes a concern but did reinforce that it does not lower her risk of aspiration or pneumonia.

## 2024-01-30 NOTE — TOC Initial Note (Signed)
 Transition of Care [facility]) - Initial/Assessment Note    Patient Details  Name: Clarence Myers MRN: [id number] Date of Birth: April 23, 1934  [facility]) CM/SW Contact:    Ruben Corolla, RN Phone Number: 01/30/2024, 3:33 PM  Clinical Narrative:Spoke to dtr Edwina Gram about d/c plans-Active w/[facility] HHPT/OT/ST-rep Amy following;has private duty care-aide-custodial level care.PT eval. On 02-monitor if needed @ home can arrange.Has own transport home.                   Expected Discharge Plan: Home w Home Health Services Barriers to Discharge: Continued Medical Work up   Patient Goals and CMS Choice   CMS Medicare.gov Compare Post Acute Care list provided to:: Patient Represenative (must comment) (Julie(dtr)) Choice offered to / list presented to : Adult Children [place] ownership interest in [facility].provided to:: Spouse    Expected Discharge Plan and Services   Discharge Planning Services: CM Consult Post Acute Care Choice: Home Health Living arrangements for the past 2 months: Single Family Home                                      Prior Living Arrangements/Services Living arrangements for the past 2 months: Single Family Home Lives with:: Spouse              Current home services: DME, Homehealth aide (rw,3n1,shower chair,hospital bed;Active w/[facility]-HHPT/OT/ST rep Amy)    Activities of Daily Living   ADL Screening (condition at time of admission) Independently performs ADLs?: No Does the patient have a NEW difficulty with bathing/dressing/toileting/self-feeding that is expected to last >3 days?: No Does the patient have a NEW difficulty with getting in/out of bed, walking, or climbing stairs that is expected to last >3 days?: No Does the patient have a NEW difficulty with communication that is expected to last >3 days?: No Is the patient deaf or have difficulty hearing?: No Does the patient have difficulty seeing, even when wearing  glasses/contacts?: No Does the patient have difficulty concentrating, remembering, or making decisions?: No  Permission Sought/Granted                  Emotional Assessment              Admission diagnosis:  Aspiration into airway [T17.908A] Aspiration into airway, initial encounter [T17.908A] Patient Active Problem List   Diagnosis Date Noted   Aspiration pneumonitis (HCC) 01/30/2024   Aspiration into airway 01/29/2024   Acute hypoxic respiratory failure (HCC) 01/10/2024   History of CVA x 2 in the past (cerebrovascular accident) 10/07/2023   CKD (chronic kidney disease), stage II 10/07/2023   GERD (gastroesophageal reflux disease) 10/07/2023   Chronic diastolic CHF (congestive heart failure) (HCC) 10/07/2023   GAD (generalized anxiety disorder) 10/07/2023   Slurred speech    Essential hypertension 09/29/2021   CAD (coronary artery disease)    Hyperlipidemia    PCP:  Glena Landau, MD Pharmacy:   [facility] - [place], [place] - [address] [place] [address] Phone: [phone] Fax: [phone]     Social Drivers of Health (SDOH) Social History: SDOH Screenings   Food Insecurity: No Food Insecurity (01/30/2024)  Housing: Low Risk  (01/30/2024)  Transportation Needs: No Transportation Needs (01/30/2024)  Utilities: Not At Risk (01/30/2024)  Social Connections: Moderately Isolated (01/30/2024)  Tobacco Use: Low Risk  (01/30/2024)  SDOH Interventions:     Readmission Risk Interventions    01/13/2024   11:53 AM  Readmission Risk Prevention Plan  Transportation Screening Complete  PCP or Specialist Appt within 5-7 Days Complete  Home Care Screening Complete  Medication Review (RN CM) Complete

## 2024-01-31 DIAGNOSIS — J9601 Acute respiratory failure with hypoxia: Secondary | ICD-10-CM | POA: Diagnosis not present

## 2024-01-31 LAB — BASIC METABOLIC PANEL WITH GFR
Anion gap: 6 (ref 5–15)
BUN: 32 mg/dL — ABNORMAL HIGH (ref 8–23)
CO2: 24 mmol/L (ref 22–32)
Calcium: 7.9 mg/dL — ABNORMAL LOW (ref 8.9–10.3)
Chloride: 107 mmol/L (ref 98–111)
Creatinine, Ser: 1.07 mg/dL (ref 0.61–1.24)
GFR, Estimated: >60 mL/min (ref >60)
Glucose, Bld: 128 mg/dL — ABNORMAL HIGH (ref 70–99)
Potassium: 3.5 mmol/L (ref 3.5–5.1)
Sodium: 137 mmol/L (ref 135–145)

## 2024-01-31 LAB — CBC
HCT: 30 % — ABNORMAL LOW (ref 39.0–52.0)
Hemoglobin: 9.5 g/dL — ABNORMAL LOW (ref 13.0–17.0)
MCH: 30.6 pg (ref 26.0–34.0)
MCHC: 31.7 g/dL (ref 30.0–36.0)
MCV: 96.8 fL (ref 80.0–100.0)
Platelets: 241 10*3/uL (ref 150–400)
RBC: 3.1 MIL/uL — ABNORMAL LOW (ref 4.22–5.81)
RDW: 14 % (ref 11.5–15.5)
WBC: 13.8 10*3/uL — ABNORMAL HIGH (ref 4.0–10.5)
nRBC: 0 % (ref 0.0–0.2)

## 2024-01-31 MED ORDER — SERTRALINE HCL 50 MG PO TABS
50.0000 mg | ORAL_TABLET | Freq: Every day | ORAL | Status: DC
  Administered 2024-01-31 – 2024-02-03 (×4): 50 mg via ORAL
  Filled 2024-01-31 (×4): qty 1

## 2024-01-31 MED ORDER — ROSUVASTATIN CALCIUM 20 MG PO TABS
20.0000 mg | ORAL_TABLET | Freq: Every day | ORAL | Status: DC
  Administered 2024-01-31 – 2024-02-03 (×4): 20 mg via ORAL
  Filled 2024-01-31 (×4): qty 1

## 2024-01-31 MED ORDER — ALPRAZOLAM 0.5 MG PO TABS
0.5000 mg | ORAL_TABLET | Freq: Two times a day (BID) | ORAL | Status: DC | PRN
  Administered 2024-01-31: 0.5 mg via ORAL
  Filled 2024-01-31 (×2): qty 1

## 2024-01-31 MED ORDER — PANTOPRAZOLE SODIUM 40 MG PO TBEC
40.0000 mg | DELAYED_RELEASE_TABLET | Freq: Every day | ORAL | Status: DC
  Administered 2024-01-31 – 2024-02-04 (×5): 40 mg via ORAL
  Filled 2024-01-31 (×5): qty 1

## 2024-01-31 MED ORDER — METOPROLOL SUCCINATE ER 25 MG PO TB24
12.5000 mg | ORAL_TABLET | Freq: Every day | ORAL | Status: DC
  Administered 2024-01-31 – 2024-02-04 (×5): 12.5 mg via ORAL
  Filled 2024-01-31 (×5): qty 1

## 2024-01-31 MED ORDER — CLOPIDOGREL BISULFATE 75 MG PO TABS
75.0000 mg | ORAL_TABLET | Freq: Every day | ORAL | Status: DC
  Administered 2024-01-31 – 2024-02-04 (×5): 75 mg via ORAL
  Filled 2024-01-31 (×5): qty 1

## 2024-01-31 MED ORDER — MIRTAZAPINE 15 MG PO TABS
15.0000 mg | ORAL_TABLET | Freq: Every day | ORAL | Status: DC
  Administered 2024-01-31 – 2024-02-03 (×4): 15 mg via ORAL
  Filled 2024-01-31 (×4): qty 1

## 2024-01-31 NOTE — Evaluation (Signed)
 Occupational Therapy Evaluation Patient Details Name: Clarence Myers MRN: [id number] DOB: July 26, 1934 Today's Date: 01/31/2024   History of Present Illness   89 yr old male presenting after aspiration event. Pt with medical history significant of  hypertension, hyperlipidemia, CVAx2, GERD, CAD s/p angioplasty1992, CKD 2, chronic diastolic CHF, anxiety. Recent admissions 5/23-5/27/25 with aspiration pneumonitis. And 2/17-2/18/25 with CVA.     Clinical Impressions The pt is currently presenting with the below listed deficits (see OT problem list). During the session today, he required max assist for lower body dressing, min assist for simulated upper body dressing, and mod assist to stand using a RW. He also appeared to be with general deconditioning and generalized weakness. He has a history of CVA, with resultant R sided weakness. Per his daughter, the pt and his spouse have daily in-home caregivers 12 hours a day who assist them with care as needed; his daughters also take turn spending the night. She declines SNF rehab and indicated the caregivers and family can care for the pt at home. OT will follow the pt for further services in the acute care setting.      If plan is discharge home, recommend the following:   A little help with walking and/or transfers;A little help with bathing/dressing/bathroom;Assistance with cooking/housework;Direct supervision/assist for medications management;Direct supervision/assist for financial management;Help with stairs or ramp for entrance;Assist for transportation     Functional Status Assessment   Patient has had a recent decline in their functional status and demonstrates the ability to make significant improvements in function in a reasonable and predictable amount of time.     Equipment Recommendations   None recommended by OT     Recommendations for Other Services         Precautions/Restrictions   Precautions Precautions:  Fall Restrictions Weight Bearing Restrictions Per Provider Order: No     Mobility Bed Mobility      General bed mobility comments: pt was received seated in chair    Transfers Overall transfer level: Needs assistance Equipment used: Rolling walker (2 wheels) Transfers: Sit to/from Stand Sit to Stand: Mod assist                  Balance     Sitting balance-Leahy Scale: Fair       Standing balance-Leahy Scale: Poor           ADL either performed or assessed with clinical judgement   ADL Overall ADL's : Needs assistance/impaired Eating/Feeding: Set up;Sitting   Grooming: Set up;Supervision/safety;Sitting           Upper Body Dressing : Minimal assistance;Sitting   Lower Body Dressing: Maximal assistance;Sitting/lateral leans Lower Body Dressing Details (indicate cue type and reason): for sock management seated in chair     Toileting- Clothing Manipulation and Hygiene: Moderate assistance;Maximal assistance;Sit to/from stand Toileting - Clothing Manipulation Details (indicate cue type and reason): at bedside commode level, based on clinical judgement             Vision Baseline Vision/History: 1 Wears glasses              Pertinent Vitals/Pain Pain Assessment Pain Assessment: No/denies pain     Extremity/Trunk Assessment Upper Extremity Assessment Upper Extremity Assessment: Right hand dominant;RUE deficits/detail;LUE deficits/detail RUE Deficits / Details:  (Chronic shoulder AROM limitations as a result of a prior CVA. AROM for shoulder flexion ~110 degrees. Elbow and hand AROM WFL. Grip strength 4-/5) LUE Deficits / Details: AROM WFL. Grip strength 4/5  Lower Extremity Assessment Lower Extremity Assessment: Generalized weakness       Communication Communication Communication: No apparent difficulties   Cognition Arousal: Lethargic (mild lethargy) Behavior During Therapy: WFL for tasks assessed/performed        OT - Cognition  Comments: Oriented to person, month, year and place, though needing occasional increased time to recall. Able to follow simple 1 step commands consistently          Following commands: Intact                  Home Living Family/patient expects to be discharged to:: Private residence Living Arrangements: Spouse/significant other Available Help at Discharge: Family;Personal care attendant;Available 24 hours/day Type of Home: House Home Access: Stairs to enter [employer] of Steps: 4 Entrance Stairs-Rails: None Home Layout: Able to live on main level with bedroom/bathroom;Two level Alternate Level Stairs-Number of Steps: he resides on the main level of the home with his bedroom and a full bathroom   Bathroom Shower/Tub: Tub/shower unit         Home Equipment: BSC/3in1;Tub bench;Wheelchair - Forensic psychologist (2 wheels)          Prior Functioning/Environment Prior Level of Function : Needs assist             Mobility Comments:  (He ambulates inside the home with supervision and using a RW.) ADLs Comments:  (He required assist for lower body dressing, supervision for showering, & assist to transfer into and out of the tub. He has caregivers daily for 12 hrs each day and his daughters take turn spending the night. Per his daughter, he is never left alone.)    OT Problem List: Impaired balance (sitting and/or standing);Impaired vision/perception;Decreased knowledge of use of DME or AE;Decreased strength;Decreased range of motion   OT Treatment/Interventions: Self-care/ADL training;Therapeutic exercise;DME and/or AE instruction;Therapeutic activities;Visual/perceptual remediation/compensation;Patient/family education;Balance training;Neuromuscular education      OT Goals(Current goals can be found in the care plan section)   Acute Rehab OT Goals OT Goal Formulation: With patient/family Time For Goal Achievement: 02/14/24 Potential to Achieve Goals:  Good ADL Goals Pt Will Perform Upper Body Dressing: with set-up;sitting Pt Will Perform Lower Body Dressing: with contact guard assist;sitting/lateral leans;sit to/from stand Pt Will Transfer to Toilet: with contact guard assist;ambulating Pt Will Perform Toileting - Clothing Manipulation and hygiene: with contact guard assist;sit to/from stand   OT Frequency:  Min 2X/week       AM-PAC OT 6 Clicks Daily Activity     Outcome Measure Help from another person eating meals?: A Little Help from another person taking care of personal grooming?: A Little Help from another person toileting, which includes using toliet, bedpan, or urinal?: A Lot Help from another person bathing (including washing, rinsing, drying)?: A Lot Help from another person to put on and taking off regular upper body clothing?: A Little Help from another person to put on and taking off regular lower body clothing?: A Lot 6 Click Score: 15   End of Session Equipment Utilized During Treatment: Rolling walker (2 wheels);Gait belt Nurse Communication: Mobility status  Activity Tolerance: Other (comment) (Fair tolerance) Patient left: in chair;with call bell/phone within reach;with chair alarm set;with family/visitor present  OT Visit Diagnosis: Unsteadiness on feet (R26.81);Other abnormalities of gait and mobility (R26.89);Low vision, both eyes (H54.2);Muscle weakness (generalized) (M62.81)                Time: 7829-5621 OT Time Calculation (min): 22 min Charges:  OT General Charges $OT  Visit: 1 Visit OT Evaluation $OT Eval Moderate Complexity: 1 Mod    Mikeala Girdler L Loni Delbridge, OTR/L 01/31/2024, 5:13 PM

## 2024-01-31 NOTE — Progress Notes (Signed)
 SLP Cancellation Note  Patient Details Name: Clarence Myers MRN: [id number] DOB: 11-Mar-1934   Cancelled treatment:       Reason Eval/Treat Not Completed: Other (comment) (Pt working with OT, RN reports pt having some coughing with intake, daughter, [name], reported pt disliked thicked liquids and pt on diet with precautions in place. Will continue efforts.)  Maudie Sorrow, MS [facility] SLP Acute Rehab Services Office [phone]  Chantal Comment 01/31/2024, 5:21 PM

## 2024-01-31 NOTE — Progress Notes (Signed)
 PROGRESS NOTE    CARLSON BELLAND  UJW:[id number] DOB: 03/27/1934 DOA: 01/29/2024 PCP: Glena Landau, MD     Brief Narrative:  Clarence Myers is an 89 year old male with past medical history significant for hypertension, hyperlipidemia, history of CVA, GERD, CAD status post angioplasty in 1992, CKD, chronic diastolic CHF, generalized anxiety disorder who presents after aspiration event.  He was recently admitted from 5/23 to 5/27 for similar event.  He was at home, working with speech therapy when he had a significant aspiration event with water and a cookie.  He had significant vigorous coughing with associated emesis.  He became significantly short of breath and EMS was called.  On EMS arrival, patient was satting 80% on room air, required 6 L nasal cannula O2 and transferred to emergency department.  He was admitted for respiratory failure as well as aspiration.  New events last 24 hours / Subjective: Had an anxiety episode this morning after watching [employer] regarding [place].  Received Xanax and became more calm, sleepy.  Family at bedside, discussed aspiration precaution, recommendation for palliative care medicine consultation.  Also discussed continued PT OT efforts.  Assessment & Plan:    Principal Problem:   Acute hypoxic respiratory failure (HCC) Active Problems:   CAD (coronary artery disease)   Hyperlipidemia   Essential hypertension   History of CVA x 2 in the past (cerebrovascular accident)   GERD (gastroesophageal reflux disease)   Chronic diastolic CHF (congestive heart failure) (HCC)   GAD (generalized anxiety disorder)   Aspiration pneumonitis (HCC)   Acute hypoxemic respiratory failure following aspiration event - Initially required 6 L nasal cannula O2, now weaned to 3 L.  Continue to wean as able  Acute aspiration pneumonitis - Patient with recent admission for similar.  SLP consulted and currently on dysphagia 3 diet.  Empiric Unasyn  - WBC peaked at 22.5,  trended downward - Palliative care consulted due to patient's recurrent aspiration event.  Will need goals of care discussion as well as to address CODE STATUS.  Family agreeable for palliative care consult  History of CVA with chronic right-sided deficits - Plavix , Crestor  - PT OT.  He had home health prior to admission.  Hypertension - Toprol   GERD - Protonix   Anxiety - Zoloft .  Remeron .  Xanax as needed - Delirium precaution  DVT prophylaxis:  heparin  injection 5,000 Units Start: 01/30/24 0600  Code Status: Full code Family Communication: At bedside Disposition Plan: Home with home health versus SNF if patient agreeable Status is: Inpatient Remains inpatient appropriate because: IV antibiotics, palliative care consult    Antimicrobials:  Anti-infectives (From admission, onward)    Start     Dose/Rate Route Frequency Ordered Stop   01/30/24 0400  ampicillin -sulbactam (UNASYN ) 1.5 g in sodium chloride  0.9 % 100 mL IVPB  Status:  Discontinued        1.5 g 200 mL/hr over 30 Minutes Intravenous Every 8 hours 01/29/24 2219 01/29/24 2227   01/30/24 0400  Ampicillin -Sulbactam (UNASYN ) 3 g in sodium chloride  0.9 % 100 mL IVPB        3 g 200 mL/hr over 30 Minutes Intravenous Every 8 hours 01/29/24 2227     01/29/24 2115  cefTRIAXone  (ROCEPHIN ) 1 g in sodium chloride  0.9 % 100 mL IVPB        1 g 200 mL/hr over 30 Minutes Intravenous  Once 01/29/24 2101 01/29/24 2204   01/29/24 2115  azithromycin  (ZITHROMAX ) 500 mg in sodium chloride  0.9 % 250 mL IVPB  500 mg 250 mL/hr over 60 Minutes Intravenous  Once 01/29/24 2101 01/29/24 2239   01/29/24 2115  metroNIDAZOLE  (FLAGYL ) IVPB 500 mg  Status:  Discontinued        500 mg 100 mL/hr over 60 Minutes Intravenous Every 12 hours 01/29/24 2101 01/30/24 0741        Objective: Vitals:   01/31/24 0636 01/31/24 0745 01/31/24 1052 01/31/24 1122  BP: 137/72   110/63  Pulse: 88   81  Resp: 16   17  Temp: 98.6 F (37 C)   98.2 F  (36.8 C)  TempSrc: Oral   Oral  SpO2: 93% 95% 95% 95%  Weight:      Height:        Intake/Output Summary (Last 24 hours) at 01/31/2024 1129 Last data filed at 01/31/2024 1100 Gross per 24 hour  Intake 2709.01 ml  Output 400 ml  Net 2309.01 ml   Filed Weights   01/29/24 2200 01/30/24 1321  Weight: 66.8 kg 65.5 kg    Examination:  General exam: Appears calm, sleepy after receiving Xanax Respiratory system: Crackles right lower base.  Respiratory effort is normal.  On nasal cannula O2 Cardiovascular system: S1 & S2 heard, RRR. No murmurs. No pedal edema. Gastrointestinal system: Abdomen is nondistended, soft  Central nervous system: Alert   Extremities: Symmetric in appearance  Skin: No rashes, lesions or ulcers on exposed skin   Data Reviewed: I have personally reviewed following labs and imaging studies  CBC: Recent Labs  Lab 01/29/24 1944 01/29/24 2239 01/30/24 0530 01/31/24 0433  WBC 19.1* 18.6* 22.5* 13.8*  NEUTROABS 16.1*  --   --   --   HGB 13.4 11.9* 11.2* 9.5*  HCT 41.8 37.4* 34.8* 30.0*  MCV 96.3 96.4 97.2 96.8  PLT 358 323 315 241   Basic Metabolic Panel: Recent Labs  Lab 01/29/24 1944 01/29/24 2239 01/30/24 0530 01/31/24 0433  NA 141  --  139 137  K 4.0  --  4.3 3.5  CL 106  --  107 107  CO2 22  --  22 24  GLUCOSE 112*  --  154* 128*  BUN 29*  --  31* 32*  CREATININE 1.26* 1.17 1.12 1.07  CALCIUM  9.3  --  8.4* 7.9*   GFR: Estimated Creatinine Clearance: 44.2 mL/min (by C-G formula based on SCr of 1.07 mg/dL). Liver Function Tests: Recent Labs  Lab 01/29/24 1944 01/30/24 0530  AST 27 22  ALT 20 16  ALKPHOS 77 57  BILITOT 0.6 1.0  PROT 8.1 6.8  ALBUMIN 3.7 3.0*   No results for input(s): LIPASE, AMYLASE in the last 168 hours. No results for input(s): AMMONIA in the last 168 hours. Coagulation Profile: No results for input(s): INR, PROTIME in the last 168 hours. Cardiac Enzymes: No results for input(s): CKTOTAL, CKMB,  CKMBINDEX, TROPONINI in the last 168 hours. BNP (last 3 results) No results for input(s): PROBNP in the last 8760 hours. HbA1C: No results for input(s): HGBA1C in the last 72 hours. CBG: No results for input(s): GLUCAP in the last 168 hours. Lipid Profile: No results for input(s): CHOL, HDL, LDLCALC, TRIG, CHOLHDL, LDLDIRECT in the last 72 hours. Thyroid  Function Tests: No results for input(s): TSH, T4TOTAL, FREET4, T3FREE, THYROIDAB in the last 72 hours. Anemia Panel: No results for input(s): VITAMINB12, FOLATE, FERRITIN, TIBC, IRON, RETICCTPCT in the last 72 hours. Sepsis Labs: Recent Labs  Lab 01/29/24 2025 01/29/24 2245  LATICACIDVEN 1.4 1.3    Recent Results (from  the past 240 hours)  Culture, blood (Routine X 2) w Reflex to ID Panel     Status: None (Preliminary result)   Collection Time: 01/29/24  8:17 PM   Specimen: BLOOD  Result Value Ref Range Status   Specimen Description   Final    BLOOD BLOOD RIGHT WRIST Performed at [facility], [address] [place], [place] [address]    Special Requests   Final    BOTTLES DRAWN AEROBIC AND ANAEROBIC Blood Culture results may not be optimal due to an inadequate volume of blood received in culture bottles Performed at [facility], [address] [place], [place] [address]    Culture   Final    NO GROWTH 2 DAYS Performed at [facility], [address] [place], [place] [address]    Report Status PENDING  Incomplete  Culture, blood (Routine X 2) w Reflex to ID Panel     Status: None (Preliminary result)   Collection Time: 01/29/24  8:17 PM   Specimen: BLOOD LEFT WRIST  Result Value Ref Range Status   Specimen Description   Final    BLOOD LEFT WRIST Performed at [facility], [address], [place], [place] [address]    Special Requests   Final    BOTTLES DRAWN AEROBIC AND ANAEROBIC Blood Culture results may not be optimal  due to an inadequate volume of blood received in culture bottles Performed at [facility], [address], [place], [place] [address]    Culture   Final    NO GROWTH 2 DAYS Performed at [facility], [address] [place], [place] [address]    Report Status PENDING  Incomplete      Radiology Studies: DG Chest Port 1 View Result Date: 01/29/2024 CLINICAL DATA:  Cough and shortness of breath EXAM: PORTABLE CHEST 1 VIEW COMPARISON:  01/10/2024 FINDINGS: Stable cardiomediastinal silhouette. Aortic atherosclerotic calcification. Reticulonodular opacities in the left greater than right lung bases. These have slightly improved compared to 01/10/2019. No pleural effusion or pneumothorax. Irregular sclerotic lesion in the proximal right humerus has a benign appearance and may represent an enchondroma. IMPRESSION: Infectious/inflammatory reticulonodular opacities in the left greater than right lung bases, slightly improved compared to 01/10/2024. Electronically Signed   By: Rozell Cornet M.D.   On: 01/29/2024 20:16      Scheduled Meds:  clopidogrel   75 mg Oral Daily   cycloSPORINE   1 drop Both Eyes BID   heparin   5,000 Units Subcutaneous Q8H   ipratropium-albuterol   3 mL Nebulization TID   latanoprost   1 drop Both Eyes QHS   metoprolol  succinate  12.5 mg Oral Daily   mirtazapine   15 mg Oral QHS   pantoprazole   40 mg Oral Daily   rosuvastatin   20 mg Oral QHS   sertraline   50 mg Oral QHS   Continuous Infusions:  ampicillin -sulbactam (UNASYN ) IV 3 g (01/31/24 1126)     LOS: 2 days   Time spent: 40 minutes   Daren Eck, DO [facility] 01/31/2024, 11:29 AM   Available via Epic secure chat 7am-7pm After these hours, please refer to coverage provider listed on [email or web address]

## 2024-01-31 NOTE — Evaluation (Signed)
 Physical Therapy Evaluation Patient Details Name: Clarence Myers MRN: [id number] DOB: 1933/12/22 Today's Date: 01/31/2024  History of Present Illness  89 y.o. male presenting after aspiration event. Pt with medical history significant of  hypertension, hyperlipidemia, CVAx2 (2025), GERD, CAD s/p angioplasty1992, CKD 2, chronic diastolic CHF, anxiety. Recent admissions 5/23-5/27/25 with aspiration pneumonitis. And 2/17-2/18/25 with CVA.  Clinical Impression  Pt admitted with above diagnosis. +2 max assist for supine to sit, +2 mod assist for sit to stand. Pt was able to take several pivotal steps from bed to recliner with RW. SpO2 95% on room air. Pt's mobility has declined from his baseline. Patient will benefit from continued inpatient follow up therapy, <3 hours/day.  Pt currently with functional limitations due to the deficits listed below (see PT Problem List). Pt will benefit from acute skilled PT to increase their independence and safety with mobility to allow discharge.           If plan is discharge home, recommend the following: A little help with bathing/dressing/bathroom;Assistance with cooking/housework;Assist for transportation;Help with stairs or ramp for entrance;A lot of help with walking and/or transfers   Can travel by private vehicle   No    Equipment Recommendations None recommended by PT  Recommendations for Other Services       Functional Status Assessment Patient has had a recent decline in their functional status and demonstrates the ability to make significant improvements in function in a reasonable and predictable amount of time.     Precautions / Restrictions Precautions Precautions: Fall Recall of Precautions/Restrictions: Impaired Restrictions Weight Bearing Restrictions Per Provider Order: No      Mobility  Bed Mobility Overal bed mobility: Needs Assistance Bed Mobility: Supine to Sit     Supine to sit: Max assist, +2 for physical assistance      General bed mobility comments: increased time and effort to achieve upright position, assist to raise trunk and pivot hips to EOB    Transfers Overall transfer level: Needs assistance Equipment used: Rolling walker (2 wheels) Transfers: Sit to/from Stand Sit to Stand: Mod assist, +2 physical assistance, +2 safety/equipment           General transfer comment: Assist to power up and anterior weight shift, cues for UE placement needed; Flexed posture in standing    Ambulation/Gait Ambulation/Gait assistance: Min assist, +2 physical assistance, +2 safety/equipment Gait Distance (Feet): 3 Feet Assistive device: Rolling walker (2 wheels) Gait Pattern/deviations: Step-to pattern, Decreased step length - right, Decreased step length - left, Shuffle, Trunk flexed Gait velocity: decr     General Gait Details: pt took several pivotal steps from bed to recliner  Stairs            Wheelchair Mobility     Tilt Bed    Modified Rankin (Stroke Patients Only)       Balance Overall balance assessment: Needs assistance Sitting-balance support: Feet supported, No upper extremity supported Sitting balance-Leahy Scale: Fair Sitting balance - Comments: kyphotic/flexed posture, can partially lift head but not to full upright position   Standing balance support: Bilateral upper extremity supported, Reliant on assistive device for balance, During functional activity Standing balance-Leahy Scale: Poor                               Pertinent Vitals/Pain Pain Assessment Pain Assessment: No/denies pain    Home Living Family/patient expects to be discharged to:: Private residence Living Arrangements: Spouse/significant other  Available Help at Discharge: Family;Personal care attendant;Available 24 hours/day Type of Home: House Home Access: Stairs to enter Entrance Stairs-Rails: None Entrance Stairs-Number of Steps: 2   Home Layout: Able to live on main level with  bedroom/bathroom Home Equipment: BSC/3in1;Tub bench;Rolling Walker (2 wheels);Cane - single point;Grab bars - tub/shower;Hospital bed;Wheelchair - manual Additional Comments: son bumps him up steps in [facility]; has 24* caregiver    Prior Function Prior Level of Function : Independent/Modified Independent             Mobility Comments: has someone right with him when he walks with RW. Has 24/7 PCA. Denies falls in past 6 months ADLs Comments: assist needed for ADLs     Extremity/Trunk Assessment   Upper Extremity Assessment Upper Extremity Assessment: Defer to OT evaluation    Lower Extremity Assessment Lower Extremity Assessment: Generalized weakness    Cervical / Trunk Assessment Cervical / Trunk Assessment: Kyphotic  Communication   Communication Communication: No apparent difficulties    Cognition Arousal: Lethargic Behavior During Therapy: WFL for tasks assessed/performed   PT - Cognitive impairments: Problem solving, Initiation, Memory                       PT - Cognition Comments: slow to process, stated it is July 2024 Following commands: Intact       Cueing Cueing Techniques: Verbal cues, Gestural cues, Tactile cues     General Comments      Exercises General Exercises - Upper Extremity Shoulder Flexion: AROM, Both, 5 reps, Seated General Exercises - Lower Extremity Ankle Circles/Pumps: AROM, Both, 10 reps, Seated Long Arc Quad: AROM, Both, 10 reps, Seated Hip Flexion/Marching: AROM, Both, 5 reps, Seated   Assessment/Plan    PT Assessment Patient needs continued PT services  PT Problem List Decreased strength;Decreased balance;Decreased mobility;Decreased activity tolerance       PT Treatment Interventions DME instruction;Gait training;Functional mobility training;Therapeutic activities;Therapeutic exercise;Balance training;Patient/family education    PT Goals (Current goals can be found in the Care Plan section)  Acute Rehab PT  Goals Patient Stated Goal: return home PT Goal Formulation: With patient/family Time For Goal Achievement: 02/14/24 Potential to Achieve Goals: Fair    Frequency Min 2X/week     Co-evaluation               AM-PAC PT 6 Clicks Mobility  Outcome Measure Help needed turning from your back to your side while in a flat bed without using bedrails?: A Lot Help needed moving from lying on your back to sitting on the side of a flat bed without using bedrails?: Total Help needed moving to and from a bed to a chair (including a wheelchair)?: A Lot Help needed standing up from a chair using your arms (e.g., wheelchair or bedside chair)?: A Lot Help needed to walk in hospital room?: A Lot Help needed climbing 3-5 steps with a railing? : Total 6 Click Score: 10    End of Session Equipment Utilized During Treatment: Gait belt Activity Tolerance: Patient limited by fatigue Patient left: with call bell/phone within reach;with family/visitor present;with chair alarm set;in chair Nurse Communication: Mobility status PT Visit Diagnosis: Unsteadiness on feet (R26.81);Other abnormalities of gait and mobility (R26.89);Muscle weakness (generalized) (M62.81)    Time: 1610-9604 PT Time Calculation (min) (ACUTE ONLY): 22 min   Charges:   PT Evaluation $PT Eval Moderate Complexity: 1 Mod   PT General Charges $$ ACUTE PT VISIT: 1 Visit  Lynann Sandman Kistler PT 01/31/2024  Acute Rehabilitation Services  Office [phone]

## 2024-02-01 DIAGNOSIS — J9601 Acute respiratory failure with hypoxia: Secondary | ICD-10-CM | POA: Diagnosis not present

## 2024-02-01 LAB — CBC
HCT: 31 % — ABNORMAL LOW (ref 39.0–52.0)
Hemoglobin: 9.8 g/dL — ABNORMAL LOW (ref 13.0–17.0)
MCH: 31.1 pg (ref 26.0–34.0)
MCHC: 31.6 g/dL (ref 30.0–36.0)
MCV: 98.4 fL (ref 80.0–100.0)
Platelets: 220 10*3/uL (ref 150–400)
RBC: 3.15 MIL/uL — ABNORMAL LOW (ref 4.22–5.81)
RDW: 13.9 % (ref 11.5–15.5)
WBC: 13.4 10*3/uL — ABNORMAL HIGH (ref 4.0–10.5)
nRBC: 0 % (ref 0.0–0.2)

## 2024-02-01 MED ORDER — ACETAMINOPHEN 325 MG PO TABS
650.0000 mg | ORAL_TABLET | Freq: Four times a day (QID) | ORAL | Status: DC | PRN
  Administered 2024-02-01: 650 mg via ORAL
  Filled 2024-02-01: qty 2

## 2024-02-01 NOTE — Progress Notes (Signed)
 PROGRESS NOTE    Clarence Myers  JXB:[id number] DOB: 1934-07-20 DOA: 01/29/2024 PCP: Glena Landau, MD     Brief Narrative:  Clarence Myers is an 89 year old male with past medical history significant for hypertension, hyperlipidemia, history of CVA, GERD, CAD status post angioplasty in 1992, CKD, chronic diastolic CHF, generalized anxiety disorder who presents after aspiration event.  He was recently admitted from 5/23 to 5/27 for similar event.  He was at home, working with speech therapy when he had a significant aspiration event with water and a cookie.  He had significant vigorous coughing with associated emesis.  He became significantly short of breath and EMS was called.  On EMS arrival, patient was satting 80% on room air, required 6 L nasal cannula O2 and transferred to emergency department.  He was admitted for respiratory failure as well as aspiration.  New events last 24 hours / Subjective: Just waking up this morning.  No complaints.  WBC trended downward 13.4  Assessment & Plan:    Principal Problem:   Acute hypoxic respiratory failure (HCC) Active Problems:   CAD (coronary artery disease)   Hyperlipidemia   Essential hypertension   History of CVA x 2 in the past (cerebrovascular accident)   GERD (gastroesophageal reflux disease)   Chronic diastolic CHF (congestive heart failure) (HCC)   GAD (generalized anxiety disorder)   Aspiration pneumonitis (HCC)   Acute hypoxemic respiratory failure following aspiration event - Initially required 6 L nasal cannula O2, now weaned to 2 L.  Continue to wean as able  Acute aspiration pneumonitis - Patient with recent admission for similar.  SLP consulted and currently on dysphagia 3 diet.  Empiric Unasyn   - WBC peaked at 22.5, trended downward - Palliative care consulted due to patient's recurrent aspiration event.  Will need goals of care discussion as well as to address CODE STATUS.  Family agreeable for palliative care  consult  History of CVA with chronic right-sided deficits - Plavix , Crestor  - PT OT.  He had home health prior to admission.  Hypertension - Toprol   GERD - Protonix   Anxiety - Zoloft .  Remeron .  Xanax as needed - Delirium precaution  DVT prophylaxis:  heparin  injection 5,000 Units Start: 01/30/24 0600  Code Status: Full code Family Communication: Daughter Clarence Myers at bedside Disposition Plan: Home with home health  Status is: Inpatient Remains inpatient appropriate because: IV antibiotics, palliative care consult    Antimicrobials:  Anti-infectives (From admission, onward)    Start     Dose/Rate Route Frequency Ordered Stop   01/30/24 0400  ampicillin -sulbactam (UNASYN ) 1.5 g in sodium chloride  0.9 % 100 mL IVPB  Status:  Discontinued        1.5 g 200 mL/hr over 30 Minutes Intravenous Every 8 hours 01/29/24 2219 01/29/24 2227   01/30/24 0400  Ampicillin -Sulbactam (UNASYN ) 3 g in sodium chloride  0.9 % 100 mL IVPB        3 g 200 mL/hr over 30 Minutes Intravenous Every 8 hours 01/29/24 2227     01/29/24 2115  cefTRIAXone  (ROCEPHIN ) 1 g in sodium chloride  0.9 % 100 mL IVPB        1 g 200 mL/hr over 30 Minutes Intravenous  Once 01/29/24 2101 01/29/24 2204   01/29/24 2115  azithromycin  (ZITHROMAX ) 500 mg in sodium chloride  0.9 % 250 mL IVPB        500 mg 250 mL/hr over 60 Minutes Intravenous  Once 01/29/24 2101 01/29/24 2239   01/29/24 2115  metroNIDAZOLE  (FLAGYL )  IVPB 500 mg  Status:  Discontinued        500 mg 100 mL/hr over 60 Minutes Intravenous Every 12 hours 01/29/24 2101 01/30/24 0741        Objective: Vitals:   02/01/24 0545 02/01/24 1044 02/01/24 1128 02/01/24 1142  BP: 128/74   124/64  Pulse: 75   82  Resp: 18   18  Temp: 100.3 F (37.9 C) 98.9 F (37.2 C)  99.1 F (37.3 C)  TempSrc: Oral Oral  Oral  SpO2: 95%  90% 95%  Weight:      Height:        Intake/Output Summary (Last 24 hours) at 02/01/2024 1147 Last data filed at 02/01/2024 1126 Gross per 24  hour  Intake 500 ml  Output 800 ml  Net -300 ml   Filed Weights   01/29/24 2200 01/30/24 1321  Weight: 66.8 kg 65.5 kg    Examination:  General exam: Appears calm Respiratory system: Crackles bilateral bases.  Respiratory effort is normal.  On nasal cannula O2 Cardiovascular system: S1 & S2 heard, RRR. No murmurs. No pedal edema. Gastrointestinal system: Abdomen is nondistended, soft  Central nervous system: Alert   Extremities: Symmetric in appearance  Skin: No rashes, lesions or ulcers on exposed skin   Data Reviewed: I have personally reviewed following labs and imaging studies  CBC: Recent Labs  Lab 01/29/24 1944 01/29/24 2239 01/30/24 0530 01/31/24 0433 02/01/24 0519  WBC 19.1* 18.6* 22.5* 13.8* 13.4*  NEUTROABS 16.1*  --   --   --   --   HGB 13.4 11.9* 11.2* 9.5* 9.8*  HCT 41.8 37.4* 34.8* 30.0* 31.0*  MCV 96.3 96.4 97.2 96.8 98.4  PLT 358 323 315 241 220   Basic Metabolic Panel: Recent Labs  Lab 01/29/24 1944 01/29/24 2239 01/30/24 0530 01/31/24 0433  NA 141  --  139 137  K 4.0  --  4.3 3.5  CL 106  --  107 107  CO2 22  --  22 24  GLUCOSE 112*  --  154* 128*  BUN 29*  --  31* 32*  CREATININE 1.26* 1.17 1.12 1.07  CALCIUM  9.3  --  8.4* 7.9*   GFR: Estimated Creatinine Clearance: 44.2 mL/min (by C-G formula based on SCr of 1.07 mg/dL). Liver Function Tests: Recent Labs  Lab 01/29/24 1944 01/30/24 0530  AST 27 22  ALT 20 16  ALKPHOS 77 57  BILITOT 0.6 1.0  PROT 8.1 6.8  ALBUMIN 3.7 3.0*   No results for input(s): LIPASE, AMYLASE in the last 168 hours. No results for input(s): AMMONIA in the last 168 hours. Coagulation Profile: No results for input(s): INR, PROTIME in the last 168 hours. Cardiac Enzymes: No results for input(s): CKTOTAL, CKMB, CKMBINDEX, TROPONINI in the last 168 hours. BNP (last 3 results) No results for input(s): PROBNP in the last 8760 hours. HbA1C: No results for input(s): HGBA1C in the last 72  hours. CBG: No results for input(s): GLUCAP in the last 168 hours. Lipid Profile: No results for input(s): CHOL, HDL, LDLCALC, TRIG, CHOLHDL, LDLDIRECT in the last 72 hours. Thyroid  Function Tests: No results for input(s): TSH, T4TOTAL, FREET4, T3FREE, THYROIDAB in the last 72 hours. Anemia Panel: No results for input(s): VITAMINB12, FOLATE, FERRITIN, TIBC, IRON, RETICCTPCT in the last 72 hours. Sepsis Labs: Recent Labs  Lab 01/29/24 2025 01/29/24 2245  LATICACIDVEN 1.4 1.3    Recent Results (from the past 240 hours)  Culture, blood (Routine X 2) w Reflex to  ID Panel     Status: None (Preliminary result)   Collection Time: 01/29/24  8:17 PM   Specimen: BLOOD  Result Value Ref Range Status   Specimen Description   Final    BLOOD BLOOD RIGHT WRIST Performed at [facility], [address], [place], [place] [address]    Special Requests   Final    BOTTLES DRAWN AEROBIC AND ANAEROBIC Blood Culture results may not be optimal due to an inadequate volume of blood received in culture bottles Performed at [facility], [address], [place], [place] [address]    Culture   Final    NO GROWTH 3 DAYS Performed at [facility], [address], [place], [place] [address]    Report Status PENDING  Incomplete  Culture, blood (Routine X 2) w Reflex to ID Panel     Status: None (Preliminary result)   Collection Time: 01/29/24  8:17 PM   Specimen: BLOOD LEFT WRIST  Result Value Ref Range Status   Specimen Description   Final    BLOOD LEFT WRIST Performed at [facility], [address] [place], [place] [address]    Special Requests   Final    BOTTLES DRAWN AEROBIC AND ANAEROBIC Blood Culture results may not be optimal due to an inadequate volume of blood received in culture bottles Performed at [facility], [address], [place], [place] [address]    Culture   Final    NO GROWTH  3 DAYS Performed at [facility], [address] [place], [place] [address]    Report Status PENDING  Incomplete      Radiology Studies: No results found.     Scheduled Meds:  clopidogrel   75 mg Oral Daily   cycloSPORINE   1 drop Both Eyes BID   heparin   5,000 Units Subcutaneous Q8H   ipratropium-albuterol   3 mL Nebulization TID   latanoprost   1 drop Both Eyes QHS   metoprolol  succinate  12.5 mg Oral Daily   mirtazapine   15 mg Oral QHS   pantoprazole   40 mg Oral Daily   rosuvastatin   20 mg Oral QHS   sertraline   50 mg Oral QHS   Continuous Infusions:  ampicillin -sulbactam (UNASYN ) IV 3 g (02/01/24 0410)     LOS: 3 days   Time spent: 25 minutes   Daren Eck, DO [facility] 02/01/2024, 11:47 AM   Available via Epic secure chat 7am-7pm After these hours, please refer to coverage provider listed on [email or web address]

## 2024-02-01 NOTE — Progress Notes (Signed)
     Referral received for Clarence Myers :goals of care discussion. Chart reviewed and updates received. Patient assessed and is unable to fully engage in discussions. Expressed need to connect with his daughter. No family present at time of visit.  Attempted to contact patient's daughter Clarence Myers x2 visa phone. Unable to reach. Voicemail left with contact information given.   PMT will re-attempt to contact family at a later time/date. Detailed note and recommendations to follow once GOC has been completed.   Thank you for your referral and allowing PMT to assist in [name]'s care.   Dellia Ferguson, AGPCNP-BC Palliative Medicine Team  Phone: [phone] Pager: [phone] Amion: [address] [place]   NO CHARGE

## 2024-02-02 DIAGNOSIS — J9601 Acute respiratory failure with hypoxia: Secondary | ICD-10-CM | POA: Diagnosis not present

## 2024-02-02 LAB — CBC
HCT: 34.1 % — ABNORMAL LOW (ref 39.0–52.0)
Hemoglobin: 10.7 g/dL — ABNORMAL LOW (ref 13.0–17.0)
MCH: 30.7 pg (ref 26.0–34.0)
MCHC: 31.4 g/dL (ref 30.0–36.0)
MCV: 98 fL (ref 80.0–100.0)
Platelets: 232 10*3/uL (ref 150–400)
RBC: 3.48 MIL/uL — ABNORMAL LOW (ref 4.22–5.81)
RDW: 13.4 % (ref 11.5–15.5)
WBC: 12.7 10*3/uL — ABNORMAL HIGH (ref 4.0–10.5)
nRBC: 0 % (ref 0.0–0.2)

## 2024-02-02 NOTE — Progress Notes (Signed)
 PROGRESS NOTE    Clarence Myers  ZOX:[id number] DOB: 1933-12-23 DOA: 01/29/2024 PCP: Glena Landau, MD     Brief Narrative:  Clarence Myers is an 89 year old male with past medical history significant for hypertension, hyperlipidemia, history of CVA, GERD, CAD status post angioplasty in 1992, CKD, chronic diastolic CHF, generalized anxiety disorder who presents after aspiration event.  He was recently admitted from 5/23 to 5/27 for similar event.  He was at home, working with speech therapy when he had a significant aspiration event with water and a cookie.  He had significant vigorous coughing with associated emesis.  He became significantly short of breath and EMS was called.  On EMS arrival, patient was satting 80% on room air, required 6 L nasal cannula O2 and transferred to emergency department.  He was admitted for respiratory failure as well as aspiration.  New events last 24 hours / Subjective: Slept all day yesterday, but feeling much more alert today. O2 4L. Encouraged to get out of bed today   Assessment & Plan:    Principal Problem:   Acute hypoxic respiratory failure (HCC) Active Problems:   CAD (coronary artery disease)   Hyperlipidemia   Essential hypertension   History of CVA x 2 in the past (cerebrovascular accident)   GERD (gastroesophageal reflux disease)   Chronic diastolic CHF (congestive heart failure) (HCC)   GAD (generalized anxiety disorder)   Aspiration pneumonitis (HCC)   Acute hypoxemic respiratory failure following aspiration event - Initially required 6 L nasal cannula O2, now 4L O2. Wean as able, home O2 desat screen   Acute aspiration pneumonitis - Patient with recent admission for similar.  SLP consulted and currently on dysphagia 3 diet.  Empiric Unasyn   - WBC peaked at 22.5, trended downward - Palliative care consulted due to patient's recurrent aspiration event.  Will need goals of care discussion as well as to address CODE STATUS.   Family agreeable for palliative care consult  History of CVA with chronic right-sided deficits - Plavix , Crestor  - PT OT.  He had home health prior to admission.  Hypertension - Toprol   GERD - Protonix   Anxiety - Zoloft .  Remeron .  Xanax as needed - Delirium precaution  DVT prophylaxis:  heparin  injection 5,000 Units Start: 01/30/24 0600  Code Status: Full code Family Communication: Daughter at bedside Disposition Plan: Home with home health  Status is: Inpatient Remains inpatient appropriate because: IV antibiotics, palliative care consult    Antimicrobials:  Anti-infectives (From admission, onward)    Start     Dose/Rate Route Frequency Ordered Stop   01/30/24 0400  ampicillin -sulbactam (UNASYN ) 1.5 g in sodium chloride  0.9 % 100 mL IVPB  Status:  Discontinued        1.5 g 200 mL/hr over 30 Minutes Intravenous Every 8 hours 01/29/24 2219 01/29/24 2227   01/30/24 0400  Ampicillin -Sulbactam (UNASYN ) 3 g in sodium chloride  0.9 % 100 mL IVPB        3 g 200 mL/hr over 30 Minutes Intravenous Every 8 hours 01/29/24 2227     01/29/24 2115  cefTRIAXone  (ROCEPHIN ) 1 g in sodium chloride  0.9 % 100 mL IVPB        1 g 200 mL/hr over 30 Minutes Intravenous  Once 01/29/24 2101 01/29/24 2204   01/29/24 2115  azithromycin  (ZITHROMAX ) 500 mg in sodium chloride  0.9 % 250 mL IVPB        500 mg 250 mL/hr over 60 Minutes Intravenous  Once 01/29/24 2101 01/29/24 2239  01/29/24 2115  metroNIDAZOLE  (FLAGYL ) IVPB 500 mg  Status:  Discontinued        500 mg 100 mL/hr over 60 Minutes Intravenous Every 12 hours 01/29/24 2101 01/30/24 0741        Objective: Vitals:   02/01/24 2042 02/01/24 2052 02/02/24 0516 02/02/24 0821  BP: 122/66  (!) 148/57   Pulse: 67  68   Resp: 16  16   Temp: 98.5 F (36.9 C)  98.5 F (36.9 C)   TempSrc: Oral  Oral   SpO2: 95% 97% 97% 95%  Weight:      Height:        Intake/Output Summary (Last 24 hours) at 02/02/2024 1157 Last data filed at 02/02/2024  0520 Gross per 24 hour  Intake 180 ml  Output 850 ml  Net -670 ml   Filed Weights   01/29/24 2200 01/30/24 1321  Weight: 66.8 kg 65.5 kg    Examination:  General exam: Appears calm Respiratory system: Crackles bilateral bases R>L.  Respiratory effort is normal.  On nasal cannula O2 Cardiovascular system: S1 & S2 heard, RRR. No murmurs. No pedal edema. Gastrointestinal system: Abdomen is nondistended, soft  Central nervous system: Alert   Extremities: Symmetric in appearance  Skin: No rashes, lesions or ulcers on exposed skin   Data Reviewed: I have personally reviewed following labs and imaging studies  CBC: Recent Labs  Lab 01/29/24 1944 01/29/24 2239 01/30/24 0530 01/31/24 0433 02/01/24 0519 02/02/24 0526  WBC 19.1* 18.6* 22.5* 13.8* 13.4* 12.7*  NEUTROABS 16.1*  --   --   --   --   --   HGB 13.4 11.9* 11.2* 9.5* 9.8* 10.7*  HCT 41.8 37.4* 34.8* 30.0* 31.0* 34.1*  MCV 96.3 96.4 97.2 96.8 98.4 98.0  PLT 358 323 315 241 220 232   Basic Metabolic Panel: Recent Labs  Lab 01/29/24 1944 01/29/24 2239 01/30/24 0530 01/31/24 0433  NA 141  --  139 137  K 4.0  --  4.3 3.5  CL 106  --  107 107  CO2 22  --  22 24  GLUCOSE 112*  --  154* 128*  BUN 29*  --  31* 32*  CREATININE 1.26* 1.17 1.12 1.07  CALCIUM  9.3  --  8.4* 7.9*   GFR: Estimated Creatinine Clearance: 44.2 mL/min (by C-G formula based on SCr of 1.07 mg/dL). Liver Function Tests: Recent Labs  Lab 01/29/24 1944 01/30/24 0530  AST 27 22  ALT 20 16  ALKPHOS 77 57  BILITOT 0.6 1.0  PROT 8.1 6.8  ALBUMIN 3.7 3.0*   No results for input(s): LIPASE, AMYLASE in the last 168 hours. No results for input(s): AMMONIA in the last 168 hours. Coagulation Profile: No results for input(s): INR, PROTIME in the last 168 hours. Cardiac Enzymes: No results for input(s): CKTOTAL, CKMB, CKMBINDEX, TROPONINI in the last 168 hours. BNP (last 3 results) No results for input(s): PROBNP in the last  8760 hours. HbA1C: No results for input(s): HGBA1C in the last 72 hours. CBG: No results for input(s): GLUCAP in the last 168 hours. Lipid Profile: No results for input(s): CHOL, HDL, LDLCALC, TRIG, CHOLHDL, LDLDIRECT in the last 72 hours. Thyroid  Function Tests: No results for input(s): TSH, T4TOTAL, FREET4, T3FREE, THYROIDAB in the last 72 hours. Anemia Panel: No results for input(s): VITAMINB12, FOLATE, FERRITIN, TIBC, IRON, RETICCTPCT in the last 72 hours. Sepsis Labs: Recent Labs  Lab 01/29/24 2025 01/29/24 2245  LATICACIDVEN 1.4 1.3    Recent Results (  from the past 240 hours)  Culture, blood (Routine X 2) w Reflex to ID Panel     Status: None (Preliminary result)   Collection Time: 01/29/24  8:17 PM   Specimen: BLOOD  Result Value Ref Range Status   Specimen Description   Final    BLOOD BLOOD RIGHT WRIST Performed at [facility], [address] [place], [place] [address]    Special Requests   Final    BOTTLES DRAWN AEROBIC AND ANAEROBIC Blood Culture results may not be optimal due to an inadequate volume of blood received in culture bottles Performed at [facility], [address], [place], [place] [address]    Culture   Final    NO GROWTH 4 DAYS Performed at [facility], [address] [place], [place] [address]    Report Status PENDING  Incomplete  Culture, blood (Routine X 2) w Reflex to ID Panel     Status: None (Preliminary result)   Collection Time: 01/29/24  8:17 PM   Specimen: BLOOD LEFT WRIST  Result Value Ref Range Status   Specimen Description   Final    BLOOD LEFT WRIST Performed at [facility], [address], [place], [place] [address]    Special Requests   Final    BOTTLES DRAWN AEROBIC AND ANAEROBIC Blood Culture results may not be optimal due to an inadequate volume of blood received in culture bottles Performed at [facility], [address], [place], [place] [address]    Culture   Final    NO GROWTH 4 DAYS Performed at [facility], [address] [place], [place] [address]    Report Status PENDING  Incomplete      Radiology Studies: No results found.     Scheduled Meds:  clopidogrel   75 mg Oral Daily   cycloSPORINE   1 drop Both Eyes BID   heparin   5,000 Units Subcutaneous Q8H   ipratropium-albuterol   3 mL Nebulization TID   latanoprost   1 drop Both Eyes QHS   metoprolol  succinate  12.5 mg Oral Daily   mirtazapine   15 mg Oral QHS   pantoprazole   40 mg Oral Daily   rosuvastatin   20 mg Oral QHS   sertraline   50 mg Oral QHS   Continuous Infusions:  ampicillin -sulbactam (UNASYN ) IV 3 g (02/02/24 0350)     LOS: 4 days   Time spent: 25 minutes   Daren Eck, DO [facility] 02/02/2024, 11:57 AM   Available via Epic secure chat 7am-7pm After these hours, please refer to coverage provider listed on [email or web address]

## 2024-02-03 DIAGNOSIS — J9601 Acute respiratory failure with hypoxia: Secondary | ICD-10-CM | POA: Diagnosis not present

## 2024-02-03 LAB — BASIC METABOLIC PANEL WITH GFR
Anion gap: 10 (ref 5–15)
BUN: 20 mg/dL (ref 8–23)
CO2: 24 mmol/L (ref 22–32)
Calcium: 8.4 mg/dL — ABNORMAL LOW (ref 8.9–10.3)
Chloride: 106 mmol/L (ref 98–111)
Creatinine, Ser: 0.92 mg/dL (ref 0.61–1.24)
GFR, Estimated: >60 mL/min (ref >60)
Glucose, Bld: 109 mg/dL — ABNORMAL HIGH (ref 70–99)
Potassium: 3.4 mmol/L — ABNORMAL LOW (ref 3.5–5.1)
Sodium: 140 mmol/L (ref 135–145)

## 2024-02-03 LAB — CBC
HCT: 33.4 % — ABNORMAL LOW (ref 39.0–52.0)
Hemoglobin: 10.4 g/dL — ABNORMAL LOW (ref 13.0–17.0)
MCH: 30.2 pg (ref 26.0–34.0)
MCHC: 31.1 g/dL (ref 30.0–36.0)
MCV: 97.1 fL (ref 80.0–100.0)
Platelets: 234 10*3/uL (ref 150–400)
RBC: 3.44 MIL/uL — ABNORMAL LOW (ref 4.22–5.81)
RDW: 13.2 % (ref 11.5–15.5)
WBC: 10.4 10*3/uL (ref 4.0–10.5)
nRBC: 0 % (ref 0.0–0.2)

## 2024-02-03 LAB — CULTURE, BLOOD (ROUTINE X 2)
Culture: NO GROWTH
Culture: NO GROWTH

## 2024-02-03 MED ORDER — IPRATROPIUM-ALBUTEROL 0.5-2.5 (3) MG/3ML IN SOLN
3.0000 mL | Freq: Two times a day (BID) | RESPIRATORY_TRACT | Status: DC
  Administered 2024-02-03 – 2024-02-04 (×2): 3 mL via RESPIRATORY_TRACT
  Filled 2024-02-03 (×2): qty 3

## 2024-02-03 MED ORDER — AMOXICILLIN-POT CLAVULANATE 875-125 MG PO TABS
1.0000 | ORAL_TABLET | Freq: Two times a day (BID) | ORAL | Status: DC
  Administered 2024-02-03 – 2024-02-04 (×3): 1 via ORAL
  Filled 2024-02-03 (×3): qty 1

## 2024-02-03 MED ORDER — POTASSIUM CHLORIDE CRYS ER 20 MEQ PO TBCR
40.0000 meq | EXTENDED_RELEASE_TABLET | Freq: Once | ORAL | Status: AC
  Administered 2024-02-03: 40 meq via ORAL
  Filled 2024-02-03: qty 2

## 2024-02-03 NOTE — Consult Note (Signed)
 Consultation Note Date: 02/03/2024   Patient Name: Clarence Myers  DOB: 11/22/1933  MRN: [id number]  Age / Sex: 89 y.o., male  PCP: Glena Landau, MD Referring Physician: Daren Eck, DO  Reason for Consultation: Establishing goals of care  HPI/Patient Profile: 89 y.o. male  admitted on 01/29/2024.   Clinical Assessment and Goals of Care: 89 year old gentleman with hypertension dyslipidemia history of stroke GERD coronary artery disease status post angioplasty chronic kidney disease chronic diastolic CHF generalized anxiety disorder. Patient was hospitalized towards the end of May for aspiration event Patient admitted at this time with aspiration event Patient working with speech therapy Patient became significantly short of breath and was brought into the hospital for further evaluation Patient remains admitted to hospital medicine service for acute hypoxic respiratory failure following an aspiration event and resultant acute aspiration pneumonitis.  He has underlying history of CVA with chronic right-sided deficits. Palliative care consulted for CODE STATUS and ongoing goals of care discussions. Chart reviewed Palliative consult request received Patient seen and examined Palliative medicine is specialized medical care for people living with serious illness. It focuses on providing relief from the symptoms and stress of a serious illness. The goal is to improve quality of life for both the patient and the family. Goals of care: Broad aims of medical therapy in relation to the patient's values and preferences. Our aim is to provide medical care aimed at enabling patients to achieve the goals that matter most to them, given the circumstances of their particular medical situation and their constraints.   Patient resting comfortably in bed.  He finished breakfast.  They are following all the aspiration  precautions as outlined by speech therapy colleagues.  He is supposed to be on dysphagia 3 diet.  He is on empiric antibiotics.  Patient states that he has had a decreased appetite but is trying to eat and follow aspiration precautions and the pick up his strength.  He wishes to be discharged home towards the end of this hospitalization.  He denies any acute complaints.  CODE STATUS discussions undertaken in detail.  Differences between full code versus DNR explained in detail.  Patient and daughter present at bedside states that they have completed some documentation living will and HCPOA in the past, they wish to be discharged home and they wish to review these documentations amongst themselves as a family and make decisions in the home setting.  Patient to remain full code for now.  See below.  NEXT OF KIN Wife, 2 daughters  SUMMARY OF RECOMMENDATIONS   1.  Full code for now.  Discussed with patient and daughter present at the bedside.  Patient reportedly has prepared advance care planning documents that are at home.  They wish to review the documents once they are home and then decide on CODE STATUS.  Patient has 2 daughters who are healthcare power of attorney agents. 2.  Discussed about additional palliative services in the home setting.  Patient has home health care services already arranged for.  Patient  and family in agreement for additional palliative services in the home setting as an additional layer of support. 3.  Continue aspiration precautions and recommendations as outlined by speech therapy. Thank you for the consult.  Code Status/Advance Care Planning: Full code   Symptom Management:     Palliative Prophylaxis:  Delirium Protocol  Additional Recommendations (Limitations, Scope, Preferences): Full Scope Treatment  Psycho-social/Spiritual:  Desire for further Chaplaincy support:yes Additional Recommendations: Caregiving  Support/Resources  Prognosis:  Unable to  determine  Discharge Planning: Home with Palliative Services      Primary Diagnoses: Present on Admission:  (Resolved) Aspiration into airway  Acute hypoxic respiratory failure (HCC)  Chronic diastolic CHF (congestive heart failure) (HCC)  CAD (coronary artery disease)  Essential hypertension  GAD (generalized anxiety disorder)  GERD (gastroesophageal reflux disease)  Hyperlipidemia   I have reviewed the medical record, interviewed the patient and family, and examined the patient. The following aspects are pertinent.  Past Medical History:  Diagnosis Date   Acute ischemic stroke (HCC) 12/30/2021   CAD (coronary artery disease)    CVA (cerebral vascular accident) (HCC) 09/29/2021   Hyperlipidemia    Hypokalemia 09/29/2021   Low back strain    Shoulder injury    Fell and injured Right shoulder   Stroke [facility])    Social History   Socioeconomic History   Marital status: Married    Spouse name: Not on file   Number of children: Not on file   Years of education: Not on file   Highest education level: Not on file  Occupational History   Not on file  Tobacco Use   Smoking status: Never   Smokeless tobacco: Never  Vaping Use   Vaping status: Never Used  Substance and Sexual Activity   Alcohol use: Never   Drug use: Never   Sexual activity: Not on file  Other Topics Concern   Not on file  Social History Narrative   Not on file   Social Drivers of Health   Financial Resource Strain: Not on file  Food Insecurity: No Food Insecurity (01/30/2024)   Hunger Vital Sign    Worried About Running Out of Food in the Last Year: Never true    Ran Out of Food in the Last Year: Never true  Transportation Needs: No Transportation Needs (01/30/2024)   PRAPARE - Administrator, Civil Service (Medical): No    Lack of Transportation (Non-Medical): No  Physical Activity: Not on file  Stress: Not on file  Social Connections: Moderately Isolated (01/30/2024)   Social  Connection and Isolation Panel    Frequency of Communication with Friends and Family: More than three times a week    Frequency of Social Gatherings with Friends and Family: More than three times a week    Attends Religious Services: Never    Database administrator or Organizations: No    Attends Banker Meetings: Never    Marital Status: Married   History reviewed. No pertinent family history. Scheduled Meds:  amoxicillin -clavulanate  1 tablet Oral Q12H   clopidogrel   75 mg Oral Daily   cycloSPORINE   1 drop Both Eyes BID   heparin   5,000 Units Subcutaneous Q8H   ipratropium-albuterol   3 mL Nebulization BID   latanoprost   1 drop Both Eyes QHS   metoprolol  succinate  12.5 mg Oral Daily   mirtazapine   15 mg Oral QHS   pantoprazole   40 mg Oral Daily   rosuvastatin   20 mg Oral  QHS   sertraline   50 mg Oral QHS   Continuous Infusions: PRN Meds:.acetaminophen , ALPRAZolam, ondansetron  **OR** ondansetron  (ZOFRAN ) IV Medications Prior to Admission:  Prior to Admission medications   Medication Sig Start Date End Date Taking? Authorizing Provider  acetaminophen  (TYLENOL ) 325 MG tablet Take 2 tablets (650 mg total) by mouth every 4 (four) hours as needed for mild pain (or temp > 37.5 C (99.5 F)). 10/09/21  Yes Abbe Abate, MD  clopidogrel  (PLAVIX ) 75 MG tablet Take 1 tablet (75 mg total) by mouth daily. RESUME ONLY AFTER COMPLETION OF 30 DAYS OF ASPIRIN  AND BRILINTA  Patient taking differently: Take 75 mg by mouth daily. 01/31/22  Yes Krishnan, Gokul, MD  latanoprost  (XALATAN ) 0.005 % ophthalmic solution Place 1 drop into both eyes at bedtime. 01/14/24  Yes Samtani, Jai-Gurmukh, MD  metoprolol  succinate (TOPROL  XL) 25 MG 24 hr tablet Take 0.5 tablets (12.5 mg total) by mouth daily. 01/14/24  Yes Samtani, Jai-Gurmukh, MD  mirtazapine  (REMERON ) 15 MG tablet Take 15 mg by mouth at bedtime. 11/13/21  Yes [provider]  Multiple Vitamin (MULTI-VITAMIN PO) Take 30 mLs by mouth  daily with breakfast.   Yes [provider]  multivitamin-lutein  (OCUVITE-LUTEIN ) CAPS Take 1 capsule by mouth daily.     Yes [provider]  Omega-3 Fatty Acids (FISH OIL PO) Take 1,000 mg by mouth in the morning and at bedtime.   Yes [provider]  omeprazole (PRILOSEC) 20 MG capsule Take 20 mg by mouth daily before breakfast. 01/21/21  Yes [provider]  Probiotic Product (PROBIOTIC DAILY PO) Take 1 capsule by mouth daily.   Yes [provider]  RESTASIS  0.05 % ophthalmic emulsion Place 1 drop into both eyes 2 (two) times daily. 10/25/20  Yes [provider]  rosuvastatin  (CRESTOR ) 20 MG tablet Take 1 tablet (20 mg total) by mouth daily. Patient taking differently: Take 20 mg by mouth at bedtime. 12/31/21  Yes Krishnan, Gokul, MD  saccharomyces boulardii (FLORASTOR) 250 MG capsule Take 1 capsule by mouth daily.   Yes [provider]  sertraline  (ZOLOFT ) 25 MG tablet Take 2 tablets (50 mg total) by mouth at bedtime. 01/14/24  Yes Samtani, Jai-Gurmukh, MD   Allergies  Allergen Reactions   Bee Venom Anaphylaxis and Swelling   Blue Crab (Cavinectes Sapidus) Allergy Skin Test Hives, Swelling, Other (See Comments) and Cough   Shellfish-Derived Products Hives   Review of Systems Denies pain denies shortness of breath Physical Exam Elderly gentleman resting in bed No acute distress Sitting upright in bed Regular work of breathing  Vital Signs: BP (!) 150/75 (BP Location: Left Arm)   Pulse 82   Temp 99.1 F (37.3 C) (Oral)   Resp 20   Ht 5' 7 (1.702 m)   Wt 65.5 kg   SpO2 96%   BMI 22.62 kg/m  Pain Scale: 0-10   Pain Score: 0-No pain   SpO2: SpO2: 96 % O2 Device:SpO2: 96 % O2 Flow Rate: .O2 Flow Rate (L/min): 4 L/min (found @)  IO: Intake/output summary:  Intake/Output Summary (Last 24 hours) at 02/03/2024 1219 Last data filed at 02/03/2024 0828 Gross per 24 hour  Intake 535 ml  Output 800 ml  Net -265 ml     LBM: Last BM Date : 01/29/24 Baseline Weight: Weight: 66.8 kg Most recent weight: Weight: 65.5 kg     Palliative Assessment/Data:   Palliative performance scale 60%  Time In: 10 Time Out: 11 Time Total: 60 Greater than 50%  of this time was spent counseling and coordinating care related to the above assessment and plan.  Signed by: Lujean Sake, MD   Please contact Palliative Medicine Team phone at [phone] for questions and concerns.  For individual provider: See Tilford Foley

## 2024-02-03 NOTE — TOC Progression Note (Addendum)
 Transition of Care [facility]) - Progression Note    Patient Details  Name: Clarence Myers MRN: [id number] Date of Birth: Jul 15, 1934  [facility] CM/SW Contact  Carter Kassel, Thersia Flax, RN Phone Number: 02/03/2024, 10:24 AM  Clinical Narrative: Spoke to dtr Lisa-confirmed still decline ST SNF prefers cotninuing w/enhabit already active w/HHPT/OT/ST;has private duty care. Awaiting to discuss GOC w/palliative care. Noted on 02-dtr has no preference for dme company-continue to monitor.[employer] rep Gladys Lamp will monitor.Has own transport home.   -1p Referral for [facility] palliative care otpt services.     Expected Discharge Plan: Home w Home Health Services Barriers to Discharge: Continued Medical Work up  Expected Discharge Plan and Services   Discharge Planning Services: CM Consult Post Acute Care Choice: Home Health Living arrangements for the past 2 months: Single Family Home                                       Social Determinants of Health (SDOH) Interventions SDOH Screenings   Food Insecurity: No Food Insecurity (01/30/2024)  Housing: Low Risk  (01/30/2024)  Transportation Needs: No Transportation Needs (01/30/2024)  Utilities: Not At Risk (01/30/2024)  Social Connections: Moderately Isolated (01/30/2024)  Tobacco Use: Low Risk  (01/30/2024)    Readmission Risk Interventions    01/13/2024   11:53 AM  Readmission Risk Prevention Plan  Transportation Screening Complete  PCP or Specialist Appt within 5-7 Days Complete  Home Care Screening Complete  Medication Review (RN CM) Complete

## 2024-02-03 NOTE — Progress Notes (Addendum)
 SATURATION QUALIFICATIONS: (This note is used to comply with regulatory documentation for home oxygen)  Patient Saturations on Room Air at Rest = 87%  Patient Saturations on 2 Liters of oxygen while at Rest = 92%  Patient Saturations on 4 Liters of oxygen while at Rest = 95%  Please briefly explain why patient needs home oxygen:  Desaturates without oxygen at rest

## 2024-02-03 NOTE — Progress Notes (Signed)
 Speech Language Pathology Treatment: Dysphagia  Patient Details Name: Clarence Myers MRN: [id number] DOB: 1934/06/29 Today's Date: 02/03/2024 Time: 4098-1191 SLP Time Calculation (min) (ACUTE ONLY): 22 min  Assessment / Plan / Recommendation Clinical Impression  Patient seen by SLP for skilled treatment focused on dysphagia goals. Patient awake, alert but no family present at bedside and per patient he stated [place] at the beach! Patient told SLP that for the past month, food goes down the left side of his throat and causes him to cough then comes back up. He has found that chewing his food more thoroughly and taking his time helps. SLP did observe him with PO intake of thin liquids (coffee) which he drank from cup. He exhibited delayed cough after sips of coffee. SLP provided patient with IDDSI (International Dysphagia Diet Standardization Initiative) handouts describing Level 6 (Soft and Bite Sized) and Level 7 (Regular easy to chew) and left it on bench seat in room for his family. SLP recommending HH SLP at discharge to focus on reinforcing swallow strategies and least restrictive diet recommendations.   HPI HPI: Clarence Myers is a 89 y.o. male presenting after aspiration event.     Patient was reportedly drinking water earlier today and had a choking sensation and then coughed.  He did not have any food at this time.  No fever no shortness of breath.  He has had issues with aspiration/choking sensation in the past. MBS in 2023 after pontine stroke show Orally pt exhibited premature spill with thin into laryngeal vestibule. Mastication of solid and oral control and transit with thicker textures was adequate. Laryngeal penetration to the vocal cords before the swallow present with thin and once aspirated before the swallow. A compensatory strategy of volitional breath hold before the swallow was effective however he was unable to consistently perform without constant cueing. Chin tuck  was ineffective to protect airway. There was minimal residue throughout in valleculae. Swallows with nectar thick were coordinated and safe. Recommend pt's liquids thickened to nectar consistency and SLP at SNF work with pt on performing breath hold strategy prior to swallow for possible upgrade to thin at bedside. with medical history significant of hypertension, hyperlipidemia, CVA, GERD, CAD, CKD 2, chronic diastolic CHF, anxiety      SLP Plan  Continue with current plan of care          Recommendations  Diet recommendations: Dysphagia 3 (mechanical soft);Thin liquid Liquids provided via: Cup;Straw Medication Administration: Whole meds with puree Supervision: Patient able to self feed;Full supervision/cueing for compensatory strategies;Staff to assist with self feeding Compensations: Minimize environmental distractions;Slow rate;Small sips/bites Postural Changes and/or Swallow Maneuvers: Seated upright 90 degrees;Upright 30-60 min after meal                        Dysphagia, unspecified (R13.10)     Continue with current plan of care    Jacqualine Mater, MA, CCC-SLP Speech Therapy

## 2024-02-03 NOTE — Progress Notes (Signed)
 PROGRESS NOTE    Clarence Myers  ZOX:[id number] DOB: 27-Jul-1934 DOA: 01/29/2024 PCP: Glena Landau, MD     Brief Narrative:  Clarence Myers is an 89 year old male with past medical history significant for hypertension, hyperlipidemia, history of CVA, GERD, CAD status post angioplasty in 1992, CKD, chronic diastolic CHF, generalized anxiety disorder who presents after aspiration event.  He was recently admitted from 5/23 to 5/27 for similar event.  He was at home, working with speech therapy when he had a significant aspiration event with water and a cookie.  He had significant vigorous coughing with associated emesis.  He became significantly short of breath and EMS was called.  On EMS arrival, patient was satting 80% on room air, required 6 L nasal cannula O2 and transferred to emergency department.  He was admitted for respiratory failure as well as aspiration.  New events last 24 hours / Subjective: Got up to a chair yesterday, but has not ambulated much.  Doing well overall.  Still on 4 L oxygen.  Discussed need for home oxygen on discharge.   Assessment & Plan:    Principal Problem:   Acute hypoxic respiratory failure (HCC) Active Problems:   CAD (coronary artery disease)   Hyperlipidemia   Essential hypertension   History of CVA x 2 in the past (cerebrovascular accident)   GERD (gastroesophageal reflux disease)   Chronic diastolic CHF (congestive heart failure) (HCC)   GAD (generalized anxiety disorder)   Aspiration pneumonitis (HCC)   Acute hypoxemic respiratory failure following aspiration event - Initially required 6 L nasal cannula O2, now 4L O2. Wean as able, home O2 desat screen positive and home O2 ordered  Acute aspiration pneumonitis - Patient with recent admission for similar.  SLP consulted and currently on dysphagia 3 diet.  Empiric Unasyn  --> changed to Augmentin  today - WBC peaked at 22.5, trended downward, leukocytosis resolved - Palliative care  consulted due to patient's recurrent aspiration event.  Will need goals of care discussion as well as to address CODE STATUS.  Family agreeable for palliative care consult  History of CVA with chronic right-sided deficits - Plavix , Crestor  - PT OT.  He had home health prior to admission.  Hypertension - Toprol   GERD - Protonix   Anxiety - Zoloft .  Remeron .  Xanax as needed - Delirium precaution  Hypokalemia - Replace  DVT prophylaxis:  heparin  injection 5,000 Units Start: 01/30/24 0600  Code Status: Full code Family Communication: Daughter at bedside Disposition Plan: Home with home health  Status is: Inpatient Remains inpatient appropriate because: Ambulate more today.  Home oxygen ordered.  If remains stable, plan for discharge home 6/17  Antimicrobials:  Anti-infectives (From admission, onward)    Start     Dose/Rate Route Frequency Ordered Stop   02/03/24 1000  amoxicillin -clavulanate (AUGMENTIN ) 875-125 MG per tablet 1 tablet        1 tablet Oral Every 12 hours 02/03/24 0729     01/30/24 0400  ampicillin -sulbactam (UNASYN ) 1.5 g in sodium chloride  0.9 % 100 mL IVPB  Status:  Discontinued        1.5 g 200 mL/hr over 30 Minutes Intravenous Every 8 hours 01/29/24 2219 01/29/24 2227   01/30/24 0400  Ampicillin -Sulbactam (UNASYN ) 3 g in sodium chloride  0.9 % 100 mL IVPB  Status:  Discontinued        3 g 200 mL/hr over 30 Minutes Intravenous Every 8 hours 01/29/24 2227 02/03/24 0729   01/29/24 2115  cefTRIAXone  (ROCEPHIN ) 1 g in  sodium chloride  0.9 % 100 mL IVPB        1 g 200 mL/hr over 30 Minutes Intravenous  Once 01/29/24 2101 01/29/24 2204   01/29/24 2115  azithromycin  (ZITHROMAX ) 500 mg in sodium chloride  0.9 % 250 mL IVPB        500 mg 250 mL/hr over 60 Minutes Intravenous  Once 01/29/24 2101 01/29/24 2239   01/29/24 2115  metroNIDAZOLE  (FLAGYL ) IVPB 500 mg  Status:  Discontinued        500 mg 100 mL/hr over 60 Minutes Intravenous Every 12 hours 01/29/24 2101  01/30/24 0741        Objective: Vitals:   02/02/24 2159 02/03/24 0444 02/03/24 0900 02/03/24 0913  BP:  (!) 150/75    Pulse:  71 82   Resp:  20    Temp:  99.1 F (37.3 C)    TempSrc:  Oral    SpO2: 93% 94% 95% 96%  Weight:      Height:        Intake/Output Summary (Last 24 hours) at 02/03/2024 1219 Last data filed at 02/03/2024 1610 Gross per 24 hour  Intake 535 ml  Output 800 ml  Net -265 ml   Filed Weights   01/29/24 2200 01/30/24 1321  Weight: 66.8 kg 65.5 kg    Examination:  General exam: Appears calm Respiratory system: Respiratory effort is normal.  On nasal cannula O2 Cardiovascular system: No pedal edema. Gastrointestinal system: Abdomen is nondistended Central nervous system: Alert   Extremities: Symmetric in appearance  Skin: No rashes, lesions or ulcers on exposed skin   Data Reviewed: I have personally reviewed following labs and imaging studies  CBC: Recent Labs  Lab 01/29/24 1944 01/29/24 2239 01/30/24 0530 01/31/24 0433 02/01/24 0519 02/02/24 0526 02/03/24 0440  WBC 19.1*   < > 22.5* 13.8* 13.4* 12.7* 10.4  NEUTROABS 16.1*  --   --   --   --   --   --   HGB 13.4   < > 11.2* 9.5* 9.8* 10.7* 10.4*  HCT 41.8   < > 34.8* 30.0* 31.0* 34.1* 33.4*  MCV 96.3   < > 97.2 96.8 98.4 98.0 97.1  PLT 358   < > 315 241 220 232 234   < > = values in this interval not displayed.   Basic Metabolic Panel: Recent Labs  Lab 01/29/24 1944 01/29/24 2239 01/30/24 0530 01/31/24 0433 02/03/24 0440  NA 141  --  139 137 140  K 4.0  --  4.3 3.5 3.4*  CL 106  --  107 107 106  CO2 22  --  22 24 24   GLUCOSE 112*  --  154* 128* 109*  BUN 29*  --  31* 32* 20  CREATININE 1.26* 1.17 1.12 1.07 0.92  CALCIUM  9.3  --  8.4* 7.9* 8.4*   GFR: Estimated Creatinine Clearance: 51.4 mL/min (by C-G formula based on SCr of 0.92 mg/dL). Liver Function Tests: Recent Labs  Lab 01/29/24 1944 01/30/24 0530  AST 27 22  ALT 20 16  ALKPHOS 77 57  BILITOT 0.6 1.0  PROT 8.1  6.8  ALBUMIN 3.7 3.0*   No results for input(s): LIPASE, AMYLASE in the last 168 hours. No results for input(s): AMMONIA in the last 168 hours. Coagulation Profile: No results for input(s): INR, PROTIME in the last 168 hours. Cardiac Enzymes: No results for input(s): CKTOTAL, CKMB, CKMBINDEX, TROPONINI in the last 168 hours. BNP (last 3 results) No results for input(s): PROBNP  in the last 8760 hours. HbA1C: No results for input(s): HGBA1C in the last 72 hours. CBG: No results for input(s): GLUCAP in the last 168 hours. Lipid Profile: No results for input(s): CHOL, HDL, LDLCALC, TRIG, CHOLHDL, LDLDIRECT in the last 72 hours. Thyroid  Function Tests: No results for input(s): TSH, T4TOTAL, FREET4, T3FREE, THYROIDAB in the last 72 hours. Anemia Panel: No results for input(s): VITAMINB12, FOLATE, FERRITIN, TIBC, IRON, RETICCTPCT in the last 72 hours. Sepsis Labs: Recent Labs  Lab 01/29/24 2025 01/29/24 2245  LATICACIDVEN 1.4 1.3    Recent Results (from the past 240 hours)  Culture, blood (Routine X 2) w Reflex to ID Panel     Status: None   Collection Time: 01/29/24  8:17 PM   Specimen: BLOOD  Result Value Ref Range Status   Specimen Description   Final    BLOOD BLOOD RIGHT WRIST Performed at [facility], [address], [place], [place] [address]    Special Requests   Final    BOTTLES DRAWN AEROBIC AND ANAEROBIC Blood Culture results may not be optimal due to an inadequate volume of blood received in culture bottles Performed at [facility], [address] [place], [place] [address]    Culture   Final    NO GROWTH 5 DAYS Performed at [facility], [address], [place], [place] [address]    Report Status 02/03/2024 FINAL  Final  Culture, blood (Routine X 2) w Reflex to ID Panel     Status: None   Collection Time: 01/29/24  8:17 PM   Specimen: BLOOD LEFT WRIST  Result  Value Ref Range Status   Specimen Description   Final    BLOOD LEFT WRIST Performed at [facility], [address] [place], [place] [address]    Special Requests   Final    BOTTLES DRAWN AEROBIC AND ANAEROBIC Blood Culture results may not be optimal due to an inadequate volume of blood received in culture bottles Performed at [facility], [address], [place], [place] [address]    Culture   Final    NO GROWTH 5 DAYS Performed at [facility], [address], [place], [place] [address]    Report Status 02/03/2024 FINAL  Final      Radiology Studies: No results found.     Scheduled Meds:  amoxicillin -clavulanate  1 tablet Oral Q12H   clopidogrel   75 mg Oral Daily   cycloSPORINE   1 drop Both Eyes BID   heparin   5,000 Units Subcutaneous Q8H   ipratropium-albuterol   3 mL Nebulization BID   latanoprost   1 drop Both Eyes QHS   metoprolol  succinate  12.5 mg Oral Daily   mirtazapine   15 mg Oral QHS   pantoprazole   40 mg Oral Daily   rosuvastatin   20 mg Oral QHS   sertraline   50 mg Oral QHS   Continuous Infusions:     LOS: 5 days   Time spent: 25 minutes   Daren Eck, DO [facility] 02/03/2024, 12:19 PM   Available via Epic secure chat 7am-7pm After these hours, please refer to coverage provider listed on [email or web address]

## 2024-02-03 NOTE — Progress Notes (Signed)
 Physical Therapy Treatment Patient Details Name: Clarence Myers MRN: [id number] DOB: Aug 03, 1934 Today's Date: 02/03/2024   History of Present Illness 89 y.o. male presenting after aspiration event. Pt with medical history significant of  hypertension, hyperlipidemia, CVAx2 (2025), GERD, CAD s/p angioplasty1992, CKD 2, chronic diastolic CHF, anxiety. Recent admissions 5/23-5/27/25 with aspiration pneumonitis. And 2/17-2/18/25 with CVA.    PT Comments  Patient deomonstrates slow progress in mobility. Able to stamd and step to reclienr and step in place x 15. Plans are to return home with 24/7 caregivers. SPO2 on 4 L, 925.     If plan is discharge home, recommend the following: Assistance with cooking/housework;Assist for transportation;Help with stairs or ramp for entrance;A lot of help with walking and/or transfers;A lot of help with bathing/dressing/bathroom   Can travel by private vehicle        Equipment Recommendations  None recommended by PT    Recommendations for Other Services       Precautions / Restrictions Precautions Precautions: Fall Recall of Precautions/Restrictions: Impaired Precaution/Restrictions Comments: on  o2 Restrictions Weight Bearing Restrictions Per Provider Order: No     Mobility  Bed Mobility Overal bed mobility: Needs Assistance Bed Mobility: Supine to Sit     Supine to sit: Max assist, HOB elevated     General bed mobility comments: initiated moving legs, required assist at trunk, use of bed pad to  sit upright and scoot to bed edge    Transfers Overall transfer level: Needs assistance Equipment used: Rolling walker (2 wheels) Transfers: Sit to/from Stand Sit to Stand: Mod assist, +2 physical assistance, +2 safety/equipment           General transfer comment: Assist to power up and anterior weight shift, cues for UE placement needed; Flexed posture in standing    Ambulation/Gait Ambulation/Gait assistance: +2 physical assistance,  +2 safety/equipment, Mod assist Gait Distance (Feet): 3 Feet Assistive device: Rolling walker (2 wheels) Gait Pattern/deviations: Step-to pattern, Decreased step length - right, Decreased step length - left, Shuffle, Trunk flexed       General Gait Details: pt took several pivotal steps from bed to recliner, stepped in place x 15 steps   Stairs             Wheelchair Mobility     Tilt Bed    Modified Rankin (Stroke Patients Only)       Balance Overall balance assessment: Needs assistance Sitting-balance support: Feet supported, Bilateral upper extremity supported Sitting balance-Leahy Scale: Poor Sitting balance - Comments: kyphotic/flexed posture, can partially lift head but not to full upright position Postural control: Posterior lean, Left lateral lean Standing balance support: Bilateral upper extremity supported, During functional activity, Reliant on assistive device for balance Standing balance-Leahy Scale: Poor Standing balance comment: RW and external assist needed                            Communication Communication Communication: No apparent difficulties  Cognition Arousal: Alert Behavior During Therapy: WFL for tasks assessed/performed   PT - Cognitive impairments: Problem solving, Initiation, Memory                       PT - Cognition Comments: slow to process, stated it is July 2024 Following commands: Intact      Cueing Cueing Techniques: Verbal cues, Gestural cues, Tactile cues  Exercises      General Comments        Pertinent  Vitals/Pain Pain Assessment Pain Assessment: No/denies pain    Home Living                          Prior Function            PT Goals (current goals can now be found in the care plan section) Progress towards PT goals: Progressing toward goals    Frequency    Min 3X/week      PT Plan      Co-evaluation              AM-PAC PT 6 Clicks Mobility    Outcome Measure  Help needed turning from your back to your side while in a flat bed without using bedrails?: A Lot Help needed moving from lying on your back to sitting on the side of a flat bed without using bedrails?: A Lot Help needed moving to and from a bed to a chair (including a wheelchair)?: A Lot Help needed standing up from a chair using your arms (e.g., wheelchair or bedside chair)?: A Lot Help needed to walk in hospital room?: Total Help needed climbing 3-5 steps with a railing? : Total 6 Click Score: 10    End of Session Equipment Utilized During Treatment: Gait belt Activity Tolerance: Patient tolerated treatment well Patient left: in chair;with call bell/phone within reach;with chair alarm set;with family/visitor present Nurse Communication: Mobility status PT Visit Diagnosis: Unsteadiness on feet (R26.81);Other abnormalities of gait and mobility (R26.89);Muscle weakness (generalized) (M62.81)     Time: 1308-6578 PT Time Calculation (min) (ACUTE ONLY): 29 min  Charges:    $Therapeutic Activity: 23-37 mins PT General Charges $$ ACUTE PT VISIT: 1 Visit                     Clarence Myers PT Acute Rehabilitation Services Office [phone]    Clarence Myers 02/03/2024, 4:12 PM

## 2024-02-04 ENCOUNTER — Other Ambulatory Visit (HOSPITAL_COMMUNITY): Payer: Self-pay

## 2024-02-04 DIAGNOSIS — J9601 Acute respiratory failure with hypoxia: Secondary | ICD-10-CM | POA: Diagnosis not present

## 2024-02-04 LAB — MAGNESIUM: Magnesium: 2 mg/dL (ref 1.7–2.4)

## 2024-02-04 LAB — BASIC METABOLIC PANEL WITH GFR
Anion gap: 10 (ref 5–15)
BUN: 25 mg/dL — ABNORMAL HIGH (ref 8–23)
CO2: 22 mmol/L (ref 22–32)
Calcium: 8.4 mg/dL — ABNORMAL LOW (ref 8.9–10.3)
Chloride: 106 mmol/L (ref 98–111)
Creatinine, Ser: 0.88 mg/dL (ref 0.61–1.24)
GFR, Estimated: >60 mL/min (ref >60)
Glucose, Bld: 94 mg/dL (ref 70–99)
Potassium: 3.5 mmol/L (ref 3.5–5.1)
Sodium: 138 mmol/L (ref 135–145)

## 2024-02-04 MED ORDER — AMOXICILLIN-POT CLAVULANATE 875-125 MG PO TABS
1.0000 | ORAL_TABLET | Freq: Two times a day (BID) | ORAL | 0 refills | Status: AC
  Filled 2024-02-04: qty 10, 5d supply, fill #0

## 2024-02-04 NOTE — Care Management Important Message (Signed)
 Important Message  Patient Details IM Letter mailed to Daughter [name]. Name: Clarence Myers MRN: [id number] Date of Birth: 08-27-1933   Important Message Given:  Yes - Medicare IM     [name] 02/04/2024, 11:45 AM

## 2024-02-04 NOTE — TOC CM/SW Note (Signed)
    Durable Medical Equipment  (From admission, onward)           Start     Ordered   02/03/24 1115  For home use only DME oxygen  Once       Question Answer Comment  Length of Need 6 Months   Mode or (Route) Nasal cannula   Liters per Minute 2   Frequency Continuous (stationary and portable oxygen unit needed)   Oxygen conserving device Yes   Oxygen delivery system Gas      02/03/24 1114

## 2024-02-04 NOTE — Plan of Care (Signed)
   Problem: Activity: Goal: Risk for activity intolerance will decrease Outcome: Progressing   Problem: Safety: Goal: Ability to remain free from injury will improve Outcome: Progressing   Problem: Skin Integrity: Goal: Risk for impaired skin integrity will decrease Outcome: Progressing

## 2024-02-04 NOTE — Discharge Summary (Signed)
 Physician Discharge Summary  Clarence Myers GNF:[id number] DOB: 15-Mar-1934 DOA: 01/29/2024  PCP: Glena Landau, MD  Admit date: 01/29/2024 Discharge date: 02/04/2024  Admitted From: Home Disposition:  Home with home health, declined SNF placement   Recommendations for Outpatient Follow-up:  Follow up with PCP Follow up with outpatient palliative care services for continued goals of care conversation  Home Health: PT OT SLP  Equipment/Devices: O2   Discharge Condition: Stable CODE STATUS: Full  Diet recommendation: Dysphagia 3   Brief/Interim Summary: Clarence Myers is an 89 year old male with past medical history significant for hypertension, hyperlipidemia, history of CVA, GERD, CAD status post angioplasty in 1992, CKD, chronic diastolic CHF, generalized anxiety disorder who presents after aspiration event.  He was recently admitted from 5/23 to 5/27 for similar event.  He was at home, working with speech therapy when he had a significant aspiration event with water and a cookie.  He had significant vigorous coughing with associated emesis.  He became significantly short of breath and EMS was called.  On EMS arrival, patient was satting 80% on room air, required 6 L nasal cannula O2 and transferred to emergency department.  He was admitted for respiratory failure as well as aspiration.  He was evaluated by SLP and placed on dysphagia 3 diet.  Initially on Unasyn  which was transitioned to Augmentin .  WBC continue to improve.  Patient was discharged home on oxygen.  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 Wean as able, home O2 desat screen positive and home O2 ordered   Acute aspiration pneumonitis - Patient with recent admission for similar.  SLP consulted and currently on dysphagia 3 diet.  Empiric Unasyn  --> changed to Augmentin   - WBC peaked at 22.5, trended downward, leukocytosis resolved - Outpatient palliative care follow-up   History of CVA with chronic right-sided deficits - Plavix , Crestor  - PT OT.  He had home health prior to admission.   Hypertension - Toprol    GERD - Protonix    Anxiety - Zoloft .  Remeron  - Delirium precaution      Discharge Instructions  Discharge Instructions     Call MD for:  difficulty breathing, headache or visual disturbances   Complete by: As directed    Call MD for:  extreme fatigue   Complete by: As directed    Call MD for:  persistant dizziness or light-headedness   Complete by: As directed    Call MD for:  persistant nausea and vomiting   Complete by: As directed    Call MD for:  severe uncontrolled pain   Complete by: As directed    Call MD for:  temperature >100.4   Complete by: As directed    Discharge instructions   Complete by: As directed    You were cared for by a hospitalist during your hospital stay. If you have any questions about your discharge medications or the care you received while you were in the hospital after you are discharged, you can call the unit and ask to speak with the hospitalist on  call if the hospitalist that took care of you is not available. Once you are discharged, your primary care physician will handle any further medical issues. Please note that NO REFILLS for any discharge medications will be authorized once you are discharged, as it is imperative that you return to your primary care physician (or establish a relationship with a primary care physician if you do not have one) for your  aftercare needs so that they can reassess your need for medications and monitor your lab values.   Increase activity slowly   Complete by: As directed       Allergies as of 02/04/2024       Reactions   Bee Venom Anaphylaxis, Swelling   Blue Crab (cavinectes Sapidus) Allergy Skin Test Hives, Swelling, Other (See Comments), Cough   Shellfish-derived Products Hives        Medication List     TAKE these medications    acetaminophen  325 MG tablet Commonly known as: TYLENOL  Take 2 tablets (650 mg total) by mouth every 4 (four) hours as needed for mild pain (or temp > 37.5 C (99.5 F)).   amoxicillin -clavulanate 875-125 MG tablet Commonly known as: AUGMENTIN  Take 1 tablet by mouth every 12 (twelve) hours for 5 days.   clopidogrel  75 MG tablet Commonly known as: PLAVIX  Take 1 tablet (75 mg total) by mouth daily. RESUME ONLY AFTER COMPLETION OF 30 DAYS OF ASPIRIN  AND BRILINTA  What changed: additional instructions   FISH OIL PO Take 1,000 mg by mouth in the morning and at bedtime.   latanoprost  0.005 % ophthalmic solution Commonly known as: XALATAN  Place 1 drop into both eyes at bedtime.   metoprolol  succinate 25 MG 24 hr tablet Commonly known as: Toprol  XL Take 0.5 tablets (12.5 mg total) by mouth daily.   mirtazapine  15 MG tablet Commonly known as: REMERON  Take 15 mg by mouth at bedtime.   MULTI-VITAMIN PO Take 30 mLs by mouth daily with breakfast.   multivitamin-lutein  Caps capsule Take 1 capsule by mouth daily.   omeprazole 20 MG capsule Commonly known as: PRILOSEC Take 20 mg by mouth daily before breakfast.   PROBIOTIC DAILY PO Take 1 capsule by mouth daily.   Restasis  0.05 % ophthalmic emulsion Generic drug: cycloSPORINE  Place 1 drop into both eyes 2 (two) times daily.   rosuvastatin  20 MG tablet Commonly known as: CRESTOR  Take 1 tablet (20 mg total) by mouth daily. What changed: when to take this   saccharomyces boulardii 250 MG capsule Commonly  known as: FLORASTOR Take 1 capsule by mouth daily.   sertraline  25 MG tablet Commonly known as: ZOLOFT  Take 2 tablets (50 mg total) by mouth at bedtime.               Durable Medical Equipment  (From admission, onward)           Start     Ordered   02/03/24 1115  For home use only DME oxygen  Once       Question Answer Comment  Length of Need 6 Months   Mode or (Route) Nasal cannula   Liters per Minute 2   Frequency Continuous (stationary and portable oxygen unit needed)   Oxygen conserving device Yes   Oxygen delivery system Gas      02/03/24 1114            Follow-up Information     Glena Landau, MD Follow up.   Specialty: Family Medicine Contact information: [address] [place] [address] [phone]         [facility] Follow up.   Why: HHPT/OT/ST Contact information: [address] [place] [address] [phone]         [facility] Follow up.   Why: home 02 Contact information[address] [place] [address] [phone]         [facility] Follow up.   Specialty: PALLIATIVE CARE Why: otpt palliative care services. Contact information: [address] [facility] [place]  [address] [phone]               Allergies  Allergen Reactions   Bee Venom Anaphylaxis and Swelling   Blue Crab (Cavinectes Sapidus) Allergy Skin Test Hives, Swelling, Other (See Comments) and Cough   Shellfish-Derived Products Hives     Procedures/Studies: DG Chest Port 1 View Result Date: 01/29/2024 CLINICAL DATA:  Cough and shortness of breath EXAM: PORTABLE CHEST 1 VIEW COMPARISON:  01/10/2024 FINDINGS: Stable cardiomediastinal silhouette. Aortic atherosclerotic calcification. Reticulonodular opacities in the left greater than right lung bases. These have slightly improved compared to 01/10/2019. No pleural effusion or pneumothorax.  Irregular sclerotic lesion in the proximal right humerus has a benign appearance and may represent an enchondroma. IMPRESSION: Infectious/inflammatory reticulonodular opacities in the left greater than right lung bases, slightly improved compared to 01/10/2024. Electronically Signed   By: Rozell Cornet M.D.   On: 01/29/2024 20:16   DG Swallowing Func-Speech Pathology Result Date: 01/11/2024 Table formatting from the original result was not included. Modified Barium Swallow Study Patient Details Name: COCHISE DINNEEN MRN: [id number] Date of Birth: 05-10-1934 Today's Date: 01/11/2024 HPI/PMH: HPI: MICKEY ESGUERRA is a 89 y.o. male presenting after aspiration event.     Patient was reportedly drinking water earlier today and had a choking sensation and then coughed.  He did not have any food at this time.  No fever no shortness of breath.  He has had issues with aspiration/choking sensation in the past. MBS in 2023 after pontine stroke show Orally pt exhibited premature spill with thin into laryngeal vestibule. Mastication of solid and oral control and transit with thicker textures was adequate. Laryngeal penetration to the vocal cords before the swallow present with thin and once aspirated before the swallow. A compensatory strategy of volitional breath hold before the swallow was effective however he was unable to consistently perform without constant cueing. Chin tuck was ineffective to protect airway. There was minimal residue throughout in valleculae. Swallows with nectar thick were coordinated and safe. Recommend pt's liquids thickened to nectar consistency and SLP at SNF work with pt on performing breath hold strategy prior to swallow for possible upgrade to thin at bedside. with medical history significant of hypertension, hyperlipidemia, CVA, GERD, CAD, CKD 2, chronic diastolic CHF, anxiety Clinical Impression: Clinical Impression: Pt demonstrates no aspiration on this study, swallow function  challeneged extensively with no signfiicant dysphagia observed. Pt initiates swallow sometimes as deep as the pyriform sinues. Pt did best with an oral hold, but when drinking consecutively, at worst, had mild premautre spillage leading to high transient penetration (PAS 2). Esophageal sweep with solids and liquids/pill showed no stasis. Pt recommended to continue a regular diet and thin liquids with basic aspiraiton precations - upright posture, stay attentive to swallow, small sips if possible. Relayed to daughter. Will f/u to observe pt with a meal. Factors that may increase risk of adverse event in presence of aspiration Roderick Civatte &  Jessy Morocco 2021): No data recorded Recommendations/Plan: Swallowing Evaluation Recommendations Swallowing Evaluation Recommendations Recommendations: PO diet PO Diet Recommendation: Regular; Thin liquids (Level 0) Liquid Administration via: Cup; Straw Medication Administration: Whole meds with liquid Supervision: Patient able to self-feed Postural changes: Position pt fully upright for meals Oral care recommendations: Oral care BID (2x/day) Treatment Plan Treatment Plan Treatment recommendations: Therapy as outlined in treatment plan below Follow-up recommendations: Home health SLP Treatment frequency: Min 2x/week Treatment duration: 1 week Interventions: Aspiration precaution training; Patient/family education Recommendations Recommendations for follow up therapy are one component of a multi-disciplinary discharge planning process, led by the attending physician.  Recommendations may be updated based on patient status, additional functional criteria and insurance authorization. Assessment: Orofacial Exam: Orofacial Exam Oral Cavity - Dentition: Adequate natural dentition Anatomy: Anatomy: -- (wide pharynx due to spinal curvature) Boluses Administered: Boluses Administered Boluses Administered: Thin liquids (Level 0); Mildly thick liquids (Level 2, nectar thick); Moderately thick liquids  (Level 3, honey thick); Puree; Solid  Oral Impairment Domain: Oral Impairment Domain Lip Closure: No labial escape Tongue control during bolus hold: Cohesive bolus between tongue to palatal seal Bolus preparation/mastication: Timely and efficient chewing and mashing Bolus transport/lingual motion: Brisk tongue motion Oral residue: Complete oral clearance Location of oral residue : N/A Initiation of pharyngeal swallow : Pyriform sinuses  Pharyngeal Impairment Domain: Pharyngeal Impairment Domain Soft palate elevation: No bolus between soft palate (SP)/pharyngeal wall (PW) Laryngeal elevation: Complete superior movement of thyroid  cartilage with complete approximation of arytenoids to epiglottic petiole Anterior hyoid excursion: Complete anterior movement Epiglottic movement: Complete inversion Laryngeal vestibule closure: Complete, no air/contrast in laryngeal vestibule Pharyngeal stripping wave : Present - complete Tongue base retraction: No contrast between tongue base and posterior pharyngeal wall (PPW) Pharyngeal residue: Complete pharyngeal clearance  Esophageal Impairment Domain: Esophageal Impairment Domain Esophageal clearance upright position: Complete clearance, esophageal coating Pill: Pill Consistency administered: Thin liquids (Level 0) Thin liquids (Level 0): [facility] Penetration/Aspiration Scale Score: Penetration/Aspiration Scale Score 1.  Material does not enter airway: Thin liquids (Level 0); Mildly thick liquids (Level 2, nectar thick); Moderately thick liquids (Level 3, honey thick); Puree; Solid; Pill 2.  Material enters airway, remains ABOVE vocal cords then ejected out: Thin liquids (Level 0) Compensatory Strategies: Compensatory Strategies Compensatory strategies: No   General Information: No data recorded Diet Prior to this Study: NPO   Temperature : Normal   Respiratory Status: WFL   Supplemental O2: None (Room air)   No data recorded Behavior/Cognition: Alert; Cooperative; Pleasant mood  Self-Feeding Abilities: Able to self-feed Baseline vocal quality/speech: Normal Volitional Cough: Able to elicit Volitional Swallow: Able to elicit No data recorded Goal Planning: Prognosis for improved oropharyngeal function: Good No data recorded No data recorded No data recorded Consulted and agree with results and recommendations: Patient; Family member/caregiver Pain: No data recorded End of Session: Start Time:SLP Start Time (ACUTE ONLY): 1210 Stop Time: SLP Stop Time (ACUTE ONLY): 1240 Time Calculation:SLP Time Calculation (min) (ACUTE ONLY): 30 min Charges: SLP Evaluations $ SLP Speech Visit: 1 Visit SLP Evaluations $BSS Swallow: 1 Procedure $MBS Swallow: 1 Procedure $Swallowing Treatment: 1 Procedure SLP visit diagnosis: SLP Visit Diagnosis: Dysphagia, unspecified (R13.10) Past Medical History: Past Medical History: Diagnosis Date  Acute ischemic stroke (HCC) 12/30/2021  CAD (coronary artery disease)   CVA (cerebral vascular accident) (HCC) 09/29/2021  Hyperlipidemia   Hypokalemia 09/29/2021  Low back strain   Shoulder injury   Fell and injured Right shoulder  Stroke [facility])  Past Surgical History: Past Surgical History: Procedure  Laterality Date  ANGIOPLASTY    1992-LAD  CARDIAC CATHETERIZATION   DeBlois, Hardin Leys 01/11/2024, 1:35 PM  DG Chest 1 View Result Date: 01/10/2024 CLINICAL DATA:  Aspiration.  Cough. EXAM: CHEST  1 VIEW portable semi upright COMPARISON:  X-ray 10/07/2023.  Older exams as well FINDINGS: Underinflation. Stable cardiopericardial silhouette when adjusted for technique. Calcified aorta. No pneumothorax. Small right pleural effusion identified. There are bilateral lung base opacities seen with some linear components. Right greater left. Infiltrate versus atelectasis. Recommend follow-up. Osteopenia. Diffuse degenerative changes. IMPRESSION: Lung base opacity seen with a small right effusion. Atelectasis versus infiltrate. Recommend follow-up. Electronically Signed   By: Adrianna Horde M.D.   On: 01/10/2024 18:15      Discharge Exam: Vitals:   02/03/24 2102 02/04/24 0435  BP:  130/75  Pulse:  73  Resp:  20  Temp:  98 F (36.7 C)  SpO2: 94% 92%    General: Pt is alert, awake, not in acute distress Cardiovascular: RRR, S1/S2 +, no edema Respiratory: Bibasilar crackles.  No respiratory distress.  No conversational dyspnea today Abdominal: Soft, ND Extremities: no edema, no cyanosis Psych: Normal mood and affect, stable judgement and insight     The results of significant diagnostics from this hospitalization (including imaging, microbiology, ancillary and laboratory) are listed below for reference.     Microbiology: Recent Results (from the past 240 hours)  Culture, blood (Routine X 2) w Reflex to ID Panel     Status: None   Collection Time: 01/29/24  8:17 PM   Specimen: BLOOD  Result Value Ref Range Status   Specimen Description   Final    BLOOD BLOOD RIGHT WRIST Performed at [facility], [address] [place], [place] [address]    Special Requests   Final    BOTTLES DRAWN AEROBIC AND ANAEROBIC Blood Culture results may not be optimal due to an inadequate volume of blood received in culture bottles Performed at [facility], [address], [place], [place] [address]    Culture   Final    NO GROWTH 5 DAYS Performed at [facility], [address], [place], [place] [address]    Report Status 02/03/2024 FINAL  Final  Culture, blood (Routine X 2) w Reflex to ID Panel     Status: None   Collection Time: 01/29/24  8:17 PM   Specimen: BLOOD LEFT WRIST  Result Value Ref Range Status   Specimen Description   Final    BLOOD LEFT WRIST Performed at [facility], [address], [place], [place] [address]    Special Requests   Final    BOTTLES DRAWN AEROBIC AND ANAEROBIC Blood Culture results may not be optimal due to an inadequate volume of blood received in culture bottles Performed at [facility], [address], [place], [place] [address]    Culture   Final    NO GROWTH 5 DAYS Performed at [facility], [address] [place], [place] [address]    Report Status 02/03/2024 FINAL  Final     Labs: BNP (last 3 results) No results for input(s): BNP in the last 8760 hours. Basic Metabolic Panel: Recent Labs  Lab 01/29/24 1944 01/29/24 2239 01/30/24 0530 01/31/24 0433 02/03/24 0440 02/04/24 0536  NA 141  --  139 137 140 138  K 4.0  --  4.3 3.5 3.4* 3.5  CL 106  --  107 107 106 106  CO2 22  --  22 24 24 22   GLUCOSE 112*  --  154* 128* 109* 94  BUN 29*  --  31* 32* 20 25*  CREATININE 1.26* 1.17 1.12 1.07 0.92 0.88  CALCIUM  9.3  --  8.4* 7.9* 8.4* 8.4*  MG  --   --   --   --   --  2.0   Liver Function Tests: Recent Labs  Lab 01/29/24 1944 01/30/24 0530  AST 27 22  ALT 20 16  ALKPHOS 77 57  BILITOT 0.6 1.0  PROT 8.1 6.8  ALBUMIN 3.7 3.0*   No results for input(s): LIPASE, AMYLASE in the last 168 hours. No results for input(s): AMMONIA in the last 168 hours. CBC: Recent Labs  Lab 01/29/24 1944 01/29/24 2239 01/30/24 0530 01/31/24 0433 02/01/24 0519 02/02/24 0526 02/03/24 0440  WBC 19.1*   < > 22.5* 13.8* 13.4* 12.7* 10.4  NEUTROABS 16.1*  --   --   --   --   --   --   HGB 13.4   < > 11.2* 9.5* 9.8* 10.7* 10.4*  HCT 41.8   < > 34.8* 30.0* 31.0* 34.1* 33.4*  MCV 96.3   < > 97.2 96.8 98.4 98.0 97.1  PLT 358   < > 315 241 220 232 234   < > = values in this interval not displayed.   Cardiac Enzymes: No results for input(s): CKTOTAL, CKMB, CKMBINDEX, TROPONINI in the last 168 hours. BNP: Invalid input(s): POCBNP CBG: No results for input(s): GLUCAP in the last 168 hours. D-Dimer No results for input(s): DDIMER in the last 72 hours. Hgb A1c No results for input(s): HGBA1C in the last 72 hours. Lipid Profile No results for input(s): CHOL, HDL, LDLCALC, TRIG, CHOLHDL, LDLDIRECT in the  last 72 hours. Thyroid  function studies No results for input(s): TSH, T4TOTAL, T3FREE, THYROIDAB in the last 72 hours.  Invalid input(s): FREET3 Anemia work up No results for input(s): VITAMINB12, FOLATE, FERRITIN, TIBC, IRON, RETICCTPCT in the last 72 hours. Urinalysis    Component Value Date/Time   COLORURINE YELLOW 12/29/2021 2002   APPEARANCEUR CLEAR 12/29/2021 2002   LABSPEC 1.009 12/29/2021 2002   PHURINE 5.0 12/29/2021 2002   GLUCOSEU NEGATIVE 12/29/2021 2002   HGBUR NEGATIVE 12/29/2021 2002   BILIRUBINUR NEGATIVE 12/29/2021 2002   KETONESUR NEGATIVE 12/29/2021 2002   PROTEINUR NEGATIVE 12/29/2021 2002   UROBILINOGEN 0.2 09/02/2007 1611   NITRITE NEGATIVE 12/29/2021 2002   LEUKOCYTESUR NEGATIVE 12/29/2021 2002   Sepsis Labs Recent Labs  Lab 01/31/24 0433 02/01/24 0519 02/02/24 0526 02/03/24 0440  WBC 13.8* 13.4* 12.7* 10.4   Microbiology Recent Results (from the past 240 hours)  Culture, blood (Routine X 2) w Reflex to ID Panel     Status: None   Collection Time: 01/29/24  8:17 PM   Specimen: BLOOD  Result Value Ref Range Status   Specimen Description   Final    BLOOD BLOOD RIGHT WRIST Performed at [facility] [address] [place], [place] [address]    Special Requests   Final    BOTTLES DRAWN AEROBIC AND ANAEROBIC Blood Culture results may not be optimal due to an inadequate volume of blood received in culture bottles Performed at [facility], [address] [place], [place] [address]    Culture   Final    NO GROWTH 5 DAYS Performed at [facility], [address], [place], [place] [address]    Report Status 02/03/2024  FINAL  Final  Culture, blood (Routine X 2) w Reflex to ID Panel     Status: None   Collection Time: 01/29/24  8:17 PM   Specimen: BLOOD LEFT WRIST  Result Value Ref Range Status   Specimen Description   Final    BLOOD LEFT WRIST Performed at [facility],  [address] [place], [place] [address]    Special Requests   Final    BOTTLES DRAWN AEROBIC AND ANAEROBIC Blood Culture results may not be optimal due to an inadequate volume of blood received in culture bottles Performed at [facility], [address], [place], [place] [address]    Culture   Final    NO GROWTH 5 DAYS Performed at [facility], [address] [place], [place] [address]    Report Status 02/03/2024 FINAL  Final     Patient was seen and examined on the day of discharge and was found to be in stable condition. Time coordinating discharge: 35 minutes including assessment and coordination of care, as well as examination of the patient.   SIGNED:  Daren Eck, DO [facility] 02/04/2024, 10:17 AM

## 2024-02-04 NOTE — TOC Transition Note (Signed)
 Transition of Care [facility]) - Discharge Note   Patient Details  Name: Clarence Myers MRN: [id number] Date of Birth: 08/02/1934  Transition of Care [facility]) CM/SW Contact:  Ruben Corolla, RN Phone Number: 02/04/2024, 9:39 AM   Clinical Narrative: Spoke to dtr Edwina Gram about d/c plans-agree to Active [facility] rep Amy HHPT/OT/ST;[facility] rep Zack home 02-deliver travel tank to rm prior d/c;[facility] otpt PCS. Has own transport home. No further CM needs..      Final next level of care: Home w Home Health Services Barriers to Discharge: No Barriers Identified   Patient Goals and CMS Choice Patient states their goals for this hospitalization and ongoing recovery are:: Home CMS Medicare.gov Compare Post Acute Care list provided to:: Patient Represenative (must comment) (Lisa(dtr)) Choice offered to / list presented to : Adult Children Roland ownership interest in [facility].provided to:: Adult Children    Discharge Placement                       Discharge Plan and Services Additional resources added to the After Visit Summary for     Discharge Planning Services: CM Consult Post Acute Care Choice: Durable Medical Equipment, Home Health ([facility] home 02;already active [facility] HHPT/OT/ST;[facility]-otpt PCS)          DME Arranged: Oxygen DME Agency: [facility] Date DME Agency Contacted: 02/04/24 Time DME Agency Contacted: [phone] Representative spoke with at DME Agency: Gladys Lamp HH Arranged: PT, OT, Speech Therapy HH Agency: [facility] Date [facility] Agency Contacted: 02/04/24 Time HH Agency Contacted: [phone]    Social Drivers of Health (SDOH) Interventions SDOH Screenings   Food Insecurity: No Food Insecurity (01/30/2024)  Housing: Low Risk  (01/30/2024)  Transportation Needs: No Transportation Needs (01/30/2024)  Utilities: Not At Risk (01/30/2024)  Social Connections: Moderately Isolated (01/30/2024)  Tobacco Use: Low Risk  (01/30/2024)     Readmission  Risk Interventions    01/13/2024   11:53 AM  Readmission Risk Prevention Plan  Transportation Screening Complete  PCP or Specialist Appt within 5-7 Days Complete  Home Care Screening Complete  Medication Review (RN CM) Complete

## 2024-02-04 NOTE — Progress Notes (Signed)
 Occupational Therapy Treatment Patient Details Name: Clarence Myers MRN: [id number] DOB: [birth year] Today's Date: 02/04/2024   History of present illness 89 y.o. male presenting after aspiration event. Pt with medical history significant of  hypertension, hyperlipidemia, CVAx2 (2025), GERD, CAD s/p angioplasty1992, CKD 2, chronic diastolic CHF, anxiety. Recent admissions 5/23-5/27/25 with aspiration pneumonitis. And 2/17-2/18/25 with CVA.   OT comments  Patient seen for skilled OT this am. Daughter arrived and confirmed HHOT and 24/7 assist and support will be provided with all needs and DME in place. OT educated patient and daughter on need for 2 person assist with RW and gait belt for mobility. Patient performed below activity and functional level for BADL's and OOB with RW. Patient continues to benefit from Acute hospital level OT and progress function to allow for safe d/c.       If plan is discharge home, recommend the following:  Two people to help with walking and/or transfers;A lot of help with bathing/dressing/bathroom;Assistance with cooking/housework;Assistance with feeding;Direct supervision/assist for medications management;Direct supervision/assist for financial management;Assist for transportation;Help with stairs or ramp for entrance;Supervision due to cognitive status   Equipment Recommendations  None recommended by OT       Precautions / Restrictions Precautions Precautions: Fall Recall of Precautions/Restrictions: Impaired Precaution/Restrictions Comments: on  o2 Restrictions Weight Bearing Restrictions Per Provider Order: No       Mobility Bed Mobility Overal bed mobility: Needs Assistance Bed Mobility: Supine to Sit     Supine to sit: Min assist, HOB elevated, Used rails     General bed mobility comments: min cues this session, motivated to go home today    Transfers Overall transfer level: Needs assistance Equipment used: Rolling walker (2  wheels) Transfers: Sit to/from Stand, Bed to chair/wheelchair/BSC Sit to Stand: Mod assist, +2 physical assistance, +2 safety/equipment, From elevated surface     Step pivot transfers: Mod assist, +2 physical assistance, +2 safety/equipment, From elevated surface     General transfer comment: daughter present for training and OT rec w/c use and + 2 with gait belt to get in/out of car     Balance Overall balance assessment: Needs assistance Sitting-balance support: Feet supported, Bilateral upper extremity supported Sitting balance-Leahy Scale: Poor Sitting balance - Comments: kyphotic/flexed posture, can partially lift head but not to full upright position, progressed to fair - after some time with L lateral leands due to R leans initially Postural control: Posterior lean, Left lateral lean Standing balance support: Bilateral upper extremity supported, During functional activity, Reliant on assistive device for balance Standing balance-Leahy Scale: Poor Standing balance comment: RW and external assist needed                           ADL either performed or assessed with clinical judgement   ADL Overall ADL's : Needs assistance/impaired Eating/Feeding: Set up;Sitting   Grooming: Set up;Supervision/safety;Sitting   Upper Body Bathing: Set up;Supervision/ safety;Sitting   Lower Body Bathing: Moderate assistance;Sit to/from stand   Upper Body Dressing : Contact guard assist;Sitting   Lower Body Dressing: Moderate assistance;Sit to/from stand   Toilet Transfer: Moderate assistance;BSC/3in1;Rolling walker (2 wheels);+2 for safety/equipment   Toileting- Clothing Manipulation and Hygiene: Moderate assistance       Functional mobility during ADLs: Moderate assistance;+2 for physical assistance;+2 for safety/equipment General ADL Comments: set up for UB mod A LB    Extremity/Trunk Assessment Upper Extremity Assessment Upper Extremity Assessment: Right hand  dominant;Generalized weakness   Lower Extremity  Assessment Lower Extremity Assessment: Generalized weakness                 Communication Communication Communication: No apparent difficulties   Cognition Arousal: Alert Behavior During Therapy: WFL for tasks assessed/performed Cognition: Cognition impaired             OT - Cognition Comments: Oriented to person, month, year and place, though needing occasional increased time to recall. Able to follow simple 1 step commands consistently; daughter bedside to assist at home with 24/7 care and pca services                 Following commands: Intact        Cueing   Cueing Techniques: Verbal cues, Gestural cues, Tactile cues        General Comments O2 in place with 4 ltrs and no SOB noted at 94% SpO2    Pertinent Vitals/ Pain       Pain Assessment Pain Assessment: No/denies pain   Frequency  Min 2X/week        Progress Toward Goals  OT Goals(current goals can now be found in the care plan section)  Progress towards OT goals: Progressing toward goals  Acute Rehab OT Goals Patient Stated Goal: to go home with family OT Goal Formulation: With patient/family Time For Goal Achievement: 02/14/24 Potential to Achieve Goals: Good ADL Goals Pt Will Perform Upper Body Dressing: with set-up;sitting Pt Will Perform Lower Body Dressing: with contact guard assist;sitting/lateral leans;sit to/from stand Pt Will Transfer to Toilet: with contact guard assist;ambulating Pt Will Perform Toileting - Clothing Manipulation and hygiene: with contact guard assist;sit to/from stand Additional ADL Goal #1: Pt/family will independently verbalize 3 strategies to reduce risk of falls Additional ADL Goal #2: Pt will verblaie 3 compensatory strategies for low vision  Plan         AM-PAC OT 6 Clicks Daily Activity     Outcome Measure   Help from another person eating meals?: A Little Help from another person taking care of  personal grooming?: A Little Help from another person toileting, which includes using toliet, bedpan, or urinal?: A Lot Help from another person bathing (including washing, rinsing, drying)?: A Lot Help from another person to put on and taking off regular upper body clothing?: A Little Help from another person to put on and taking off regular lower body clothing?: A Lot 6 Click Score: 15    End of Session Equipment Utilized During Treatment: Rolling walker (2 wheels);Gait belt  OT Visit Diagnosis: Unsteadiness on feet (R26.81);Other abnormalities of gait and mobility (R26.89);Low vision, both eyes (H54.2);Muscle weakness (generalized) (M62.81)   Activity Tolerance Patient tolerated treatment well   Patient Left in chair;with call bell/phone within reach;with chair alarm set;with family/visitor present   Nurse Communication Mobility status        Time: 0920-0950 OT Time Calculation (min): 30 min  Charges: OT General Charges $OT Visit: 1 Visit OT Treatments $Therapeutic Activity: 23-37 mins  Luke Falero OT/L Acute Rehabilitation Department  [phone]  02/04/2024, 12:39 PM

## 2024-02-04 NOTE — Progress Notes (Signed)
 Clarence Myers -- [facility] Liaison Note  Received request from Charyl Coppersmith, [facility] for outpatient palliative services at discharge.  Noted that patient is discharging today.  Please call with any questions or concerns.  Thank you for the opportunity to participate in this patients care.  Lestine Rathke, BSN, [employer] [phone]

## 2024-02-08 DIAGNOSIS — I130 Hypertensive heart and chronic kidney disease with heart failure and stage 1 through stage 4 chronic kidney disease, or unspecified chronic kidney disease: Secondary | ICD-10-CM | POA: Diagnosis not present

## 2024-02-08 DIAGNOSIS — I69341 Monoplegia of lower limb following cerebral infarction affecting right dominant side: Secondary | ICD-10-CM | POA: Diagnosis not present

## 2024-02-08 DIAGNOSIS — F319 Bipolar disorder, unspecified: Secondary | ICD-10-CM | POA: Diagnosis not present

## 2024-02-08 DIAGNOSIS — J9601 Acute respiratory failure with hypoxia: Secondary | ICD-10-CM | POA: Diagnosis not present

## 2024-02-08 DIAGNOSIS — I060 Rheumatic aortic stenosis: Secondary | ICD-10-CM | POA: Diagnosis not present

## 2024-02-08 DIAGNOSIS — F411 Generalized anxiety disorder: Secondary | ICD-10-CM | POA: Diagnosis not present

## 2024-02-08 DIAGNOSIS — D631 Anemia in chronic kidney disease: Secondary | ICD-10-CM | POA: Diagnosis not present

## 2024-02-08 DIAGNOSIS — I2510 Atherosclerotic heart disease of native coronary artery without angina pectoris: Secondary | ICD-10-CM | POA: Diagnosis not present

## 2024-02-08 DIAGNOSIS — I69344 Monoplegia of lower limb following cerebral infarction affecting left non-dominant side: Secondary | ICD-10-CM | POA: Diagnosis not present

## 2024-02-08 DIAGNOSIS — J690 Pneumonitis due to inhalation of food and vomit: Secondary | ICD-10-CM | POA: Diagnosis not present

## 2024-02-08 DIAGNOSIS — I5032 Chronic diastolic (congestive) heart failure: Secondary | ICD-10-CM | POA: Diagnosis not present

## 2024-02-08 DIAGNOSIS — N182 Chronic kidney disease, stage 2 (mild): Secondary | ICD-10-CM | POA: Diagnosis not present

## 2024-02-12 DIAGNOSIS — F39 Unspecified mood [affective] disorder: Secondary | ICD-10-CM | POA: Diagnosis not present

## 2024-02-12 DIAGNOSIS — R531 Weakness: Secondary | ICD-10-CM | POA: Diagnosis not present

## 2024-02-12 DIAGNOSIS — R1310 Dysphagia, unspecified: Secondary | ICD-10-CM | POA: Diagnosis not present

## 2024-02-12 DIAGNOSIS — Z9981 Dependence on supplemental oxygen: Secondary | ICD-10-CM | POA: Diagnosis not present

## 2024-02-12 DIAGNOSIS — J9691 Respiratory failure, unspecified with hypoxia: Secondary | ICD-10-CM | POA: Diagnosis not present

## 2024-02-12 DIAGNOSIS — J690 Pneumonitis due to inhalation of food and vomit: Secondary | ICD-10-CM | POA: Diagnosis not present

## 2024-03-05 ENCOUNTER — Other Ambulatory Visit (HOSPITAL_COMMUNITY): Payer: Self-pay

## 2024-03-05 DIAGNOSIS — J9601 Acute respiratory failure with hypoxia: Secondary | ICD-10-CM | POA: Diagnosis not present

## 2024-03-05 DIAGNOSIS — I5032 Chronic diastolic (congestive) heart failure: Secondary | ICD-10-CM | POA: Diagnosis not present

## 2024-03-11 DIAGNOSIS — R531 Weakness: Secondary | ICD-10-CM | POA: Diagnosis not present

## 2024-03-11 DIAGNOSIS — F39 Unspecified mood [affective] disorder: Secondary | ICD-10-CM | POA: Diagnosis not present

## 2024-03-11 DIAGNOSIS — R1310 Dysphagia, unspecified: Secondary | ICD-10-CM | POA: Diagnosis not present

## 2024-03-11 DIAGNOSIS — J690 Pneumonitis due to inhalation of food and vomit: Secondary | ICD-10-CM | POA: Diagnosis not present

## 2024-03-11 DIAGNOSIS — Z9981 Dependence on supplemental oxygen: Secondary | ICD-10-CM | POA: Diagnosis not present

## 2024-03-11 DIAGNOSIS — J9691 Respiratory failure, unspecified with hypoxia: Secondary | ICD-10-CM | POA: Diagnosis not present

## 2024-03-13 DIAGNOSIS — H401133 Primary open-angle glaucoma, bilateral, severe stage: Secondary | ICD-10-CM | POA: Diagnosis not present

## 2024-03-13 DIAGNOSIS — J9601 Acute respiratory failure with hypoxia: Secondary | ICD-10-CM | POA: Diagnosis not present

## 2024-03-13 DIAGNOSIS — I5032 Chronic diastolic (congestive) heart failure: Secondary | ICD-10-CM | POA: Diagnosis not present

## 2024-04-05 DIAGNOSIS — I5032 Chronic diastolic (congestive) heart failure: Secondary | ICD-10-CM | POA: Diagnosis not present

## 2024-04-05 DIAGNOSIS — J9601 Acute respiratory failure with hypoxia: Secondary | ICD-10-CM | POA: Diagnosis not present

## 2024-04-08 DIAGNOSIS — I060 Rheumatic aortic stenosis: Secondary | ICD-10-CM | POA: Diagnosis not present

## 2024-04-08 DIAGNOSIS — F319 Bipolar disorder, unspecified: Secondary | ICD-10-CM | POA: Diagnosis not present

## 2024-04-08 DIAGNOSIS — D631 Anemia in chronic kidney disease: Secondary | ICD-10-CM | POA: Diagnosis not present

## 2024-04-08 DIAGNOSIS — I69341 Monoplegia of lower limb following cerebral infarction affecting right dominant side: Secondary | ICD-10-CM | POA: Diagnosis not present

## 2024-04-08 DIAGNOSIS — I2510 Atherosclerotic heart disease of native coronary artery without angina pectoris: Secondary | ICD-10-CM | POA: Diagnosis not present

## 2024-04-08 DIAGNOSIS — I69344 Monoplegia of lower limb following cerebral infarction affecting left non-dominant side: Secondary | ICD-10-CM | POA: Diagnosis not present

## 2024-04-08 DIAGNOSIS — E785 Hyperlipidemia, unspecified: Secondary | ICD-10-CM | POA: Diagnosis not present

## 2024-04-08 DIAGNOSIS — I5032 Chronic diastolic (congestive) heart failure: Secondary | ICD-10-CM | POA: Diagnosis not present

## 2024-04-08 DIAGNOSIS — I130 Hypertensive heart and chronic kidney disease with heart failure and stage 1 through stage 4 chronic kidney disease, or unspecified chronic kidney disease: Secondary | ICD-10-CM | POA: Diagnosis not present

## 2024-04-08 DIAGNOSIS — F411 Generalized anxiety disorder: Secondary | ICD-10-CM | POA: Diagnosis not present

## 2024-04-08 DIAGNOSIS — N182 Chronic kidney disease, stage 2 (mild): Secondary | ICD-10-CM | POA: Diagnosis not present

## 2024-04-08 DIAGNOSIS — J9601 Acute respiratory failure with hypoxia: Secondary | ICD-10-CM | POA: Diagnosis not present

## 2024-04-13 DIAGNOSIS — I5032 Chronic diastolic (congestive) heart failure: Secondary | ICD-10-CM | POA: Diagnosis not present

## 2024-04-13 DIAGNOSIS — J9601 Acute respiratory failure with hypoxia: Secondary | ICD-10-CM | POA: Diagnosis not present

## 2024-05-02 ENCOUNTER — Emergency Department (HOSPITAL_COMMUNITY)

## 2024-05-02 ENCOUNTER — Emergency Department (HOSPITAL_COMMUNITY)
Admission: EM | Admit: 2024-05-02 | Discharge: 2024-05-02 | Disposition: A | Discharge status: Home / Self Care | Attending: Emergency Medicine | Admitting: Emergency Medicine

## 2024-05-02 DIAGNOSIS — Z8673 Personal history of transient ischemic attack (TIA), and cerebral infarction without residual deficits: Secondary | ICD-10-CM | POA: Diagnosis not present

## 2024-05-02 DIAGNOSIS — W19XXXA Unspecified fall, initial encounter: Secondary | ICD-10-CM

## 2024-05-02 DIAGNOSIS — I5032 Chronic diastolic (congestive) heart failure: Secondary | ICD-10-CM | POA: Insufficient documentation

## 2024-05-02 DIAGNOSIS — S0083XA Contusion of other part of head, initial encounter: Secondary | ICD-10-CM | POA: Insufficient documentation

## 2024-05-02 DIAGNOSIS — R0989 Other specified symptoms and signs involving the circulatory and respiratory systems: Secondary | ICD-10-CM | POA: Diagnosis not present

## 2024-05-02 DIAGNOSIS — W01198A Fall on same level from slipping, tripping and stumbling with subsequent striking against other object, initial encounter: Secondary | ICD-10-CM | POA: Insufficient documentation

## 2024-05-02 DIAGNOSIS — Z955 Presence of coronary angioplasty implant and graft: Secondary | ICD-10-CM | POA: Insufficient documentation

## 2024-05-02 DIAGNOSIS — Y9301 Activity, walking, marching and hiking: Secondary | ICD-10-CM | POA: Insufficient documentation

## 2024-05-02 DIAGNOSIS — S3993XA Unspecified injury of pelvis, initial encounter: Secondary | ICD-10-CM | POA: Diagnosis not present

## 2024-05-02 DIAGNOSIS — I130 Hypertensive heart and chronic kidney disease with heart failure and stage 1 through stage 4 chronic kidney disease, or unspecified chronic kidney disease: Secondary | ICD-10-CM | POA: Insufficient documentation

## 2024-05-02 DIAGNOSIS — S199XXA Unspecified injury of neck, initial encounter: Secondary | ICD-10-CM | POA: Diagnosis not present

## 2024-05-02 DIAGNOSIS — Z7902 Long term (current) use of antithrombotics/antiplatelets: Secondary | ICD-10-CM | POA: Diagnosis not present

## 2024-05-02 DIAGNOSIS — Z23 Encounter for immunization: Secondary | ICD-10-CM | POA: Diagnosis not present

## 2024-05-02 DIAGNOSIS — S0990XA Unspecified injury of head, initial encounter: Secondary | ICD-10-CM

## 2024-05-02 DIAGNOSIS — S0993XA Unspecified injury of face, initial encounter: Secondary | ICD-10-CM | POA: Diagnosis not present

## 2024-05-02 DIAGNOSIS — R9089 Other abnormal findings on diagnostic imaging of central nervous system: Secondary | ICD-10-CM | POA: Diagnosis not present

## 2024-05-02 DIAGNOSIS — N189 Chronic kidney disease, unspecified: Secondary | ICD-10-CM | POA: Diagnosis not present

## 2024-05-02 DIAGNOSIS — S299XXA Unspecified injury of thorax, initial encounter: Secondary | ICD-10-CM | POA: Diagnosis not present

## 2024-05-02 DIAGNOSIS — I672 Cerebral atherosclerosis: Secondary | ICD-10-CM | POA: Diagnosis not present

## 2024-05-02 DIAGNOSIS — I2510 Atherosclerotic heart disease of native coronary artery without angina pectoris: Secondary | ICD-10-CM | POA: Insufficient documentation

## 2024-05-02 DIAGNOSIS — R220 Localized swelling, mass and lump, head: Secondary | ICD-10-CM | POA: Diagnosis present

## 2024-05-02 MED ORDER — TETANUS-DIPHTH-ACELL PERTUSSIS 5-2.5-18.5 LF-MCG/0.5 IM SUSY
0.5000 mL | PREFILLED_SYRINGE | Freq: Once | INTRAMUSCULAR | Status: AC
  Administered 2024-05-02: 0.5 mL via INTRAMUSCULAR
  Filled 2024-05-02: qty 0.5

## 2024-05-02 NOTE — ED Notes (Signed)
 Pt BIB [facility] due to fall on thinners. Pt fell from tripping on his walker. Denies LOC. Pt did hit head and has a bump on forehead. Pt is on plavix .  130/60 HR 64 CBG 94 98% RA GCS !%

## 2024-05-02 NOTE — Discharge Instructions (Signed)
 You were seen in the emergency department today for a fall where you hit your head.  While you are here we did physical exam and imaging of your head, neck, chest and pelvis which were all reassuring.  There was no evidence of traumatic injuries.  We also updated your tetanus vaccine status.  You may take Tylenol  as needed for pain at home.  You may also use warm compresses or ice packs to the head if you have pain over the area where you hit your head.  Come back to the emergency department if you have altered mental status, seizure-like activity, vision changes, severe headache or if you have any other reason to believe you need emergency care.

## 2024-05-02 NOTE — ED Provider Notes (Signed)
 [place] EMERGENCY DEPARTMENT AT [facility] Provider Note HPI Rad Gramling is a 89 y.o. male with a medical history of HTN, HLD, history of CVA, GERD, CAD status post angioplasty in 1992, CKD, chronic diastolic CHF, generalized anxiety disorder who presents to manage department after mechanical fall.  History provided by patient family in the room.  Patient walks with a walker at baseline but needs assistance to walk with a walker.  Today he was walking on his own using his walker and the walker got caught on the rug and he fell.  He hit his forehead on the leg of the walker.  He did not lose consciousness.  No seizure-like activity seen.  He did not sustain other injuries.  He is not on any blood thinners but does take Plavix  daily.  He is not complaining of any pain.  Denies preceding chest pain, dizziness, shortness of breath, abdominal pain.  Past Medical History:  Diagnosis Date   Acute ischemic stroke (HCC) 12/30/2021   CAD (coronary artery disease)    CVA (cerebral vascular accident) (HCC) 09/29/2021   Hyperlipidemia    Hypokalemia 09/29/2021   Low back strain    Shoulder injury    Fell and injured Right shoulder   Stroke [facility])    Past Surgical History:  Procedure Laterality Date   ANGIOPLASTY     1992-LAD   CARDIAC CATHETERIZATION      Review of Systems Pertinent positives and negative findings are listed as part of the History of Present Illness and MDM  Physical Exam Vitals:   05/02/24 2034 05/02/24 2041 05/02/24 2042 05/02/24 2043  BP: 110/60     Pulse: 67     Resp: 16     SpO2: 97%  98% 97%  Weight:  61.2 kg    Height:  5' 7 (1.702 m)       Constitutional Nursing notes reviewed Vital signs reviewed  HEENT Right forehead hematoma Pupils elliptical in shape and nonreactive due to prior surgery Extraocular movements intact Pupils cross midline Neck supple  Respiratory Effort normal Breathing well on room air CTAB  CV Normal rate and rhythm   No chest wall or clavicular tenderness to palpation  Abdomen Soft, non-tender, non-distended No peritonitis No ecchymosis, abrasions or traumatic injury to the abdomen  MSK Atraumatic No obvious deformity ROM appropriate No bony tenderness over the legs or arms  Neuro Cranial nerves grossly intact 5/5 strength in bilateral upper and lower extremities Sensation to light touch intact    MDM:  Initial Differential Diagnoses includes traumatic injury secondary to ground-level fall  I reviewed the patient's vitals, the nursing triage note and evaluated the patient at bedside.   I reviewed the patient's external records which show ***  I considered ACS and reviewed/interpreted the EKG. EKG shows normal sinus rhythm with no evidence of acute ischemic changes or high grade conduction block. There is no STEMI or contiguous T wave inversions. No evidence of new-onset arrythmia. The PR, QRS, and QT intervals are normal.   The patient was risk stratified with a HEAR score of ***. Troponin  *** --> ***. This significantly lowers my suspicion for ACS.  Procedures: Procedures  Medications administered in the ED: Medications - No data to display   Impression: No diagnosis found.   Patient's presentation is most consistent with {EM COPA:27473}  Disposition: ED Disposition:  Data Unavailable   ***Discharge: Patient is felt to be medically appropriate for discharge at this time. Patient was instructed to  follow up with their primary care doctor/specialists listed above for re-evaluation. Patient was given strict return precautions.  ED Discharge Orders     None

## 2024-05-06 DIAGNOSIS — I5032 Chronic diastolic (congestive) heart failure: Secondary | ICD-10-CM | POA: Diagnosis not present

## 2024-05-06 DIAGNOSIS — J9601 Acute respiratory failure with hypoxia: Secondary | ICD-10-CM | POA: Diagnosis not present

## 2024-05-14 DIAGNOSIS — J9601 Acute respiratory failure with hypoxia: Secondary | ICD-10-CM | POA: Diagnosis not present

## 2024-05-14 DIAGNOSIS — I5032 Chronic diastolic (congestive) heart failure: Secondary | ICD-10-CM | POA: Diagnosis not present

## 2024-05-20 ENCOUNTER — Other Ambulatory Visit: Payer: Self-pay

## 2024-05-20 ENCOUNTER — Emergency Department (HOSPITAL_COMMUNITY)

## 2024-05-20 ENCOUNTER — Encounter (HOSPITAL_COMMUNITY): Payer: Self-pay | Admitting: *Deleted

## 2024-05-20 ENCOUNTER — Inpatient Hospital Stay (HOSPITAL_COMMUNITY)
Admission: EM | Admit: 2024-05-20 | Discharge: 2024-05-28 | DRG: 871 | Disposition: A | Discharge status: Skilled Nursing Facility | Attending: Internal Medicine | Admitting: Internal Medicine

## 2024-05-20 DIAGNOSIS — R918 Other nonspecific abnormal finding of lung field: Secondary | ICD-10-CM | POA: Diagnosis not present

## 2024-05-20 DIAGNOSIS — Z7901 Long term (current) use of anticoagulants: Secondary | ICD-10-CM | POA: Diagnosis not present

## 2024-05-20 DIAGNOSIS — J189 Pneumonia, unspecified organism: Secondary | ICD-10-CM | POA: Diagnosis present

## 2024-05-20 DIAGNOSIS — N390 Urinary tract infection, site not specified: Secondary | ICD-10-CM | POA: Diagnosis not present

## 2024-05-20 DIAGNOSIS — N3001 Acute cystitis with hematuria: Secondary | ICD-10-CM | POA: Diagnosis not present

## 2024-05-20 DIAGNOSIS — I5032 Chronic diastolic (congestive) heart failure: Secondary | ICD-10-CM | POA: Diagnosis not present

## 2024-05-20 DIAGNOSIS — R1310 Dysphagia, unspecified: Secondary | ICD-10-CM | POA: Diagnosis present

## 2024-05-20 DIAGNOSIS — N200 Calculus of kidney: Secondary | ICD-10-CM | POA: Diagnosis not present

## 2024-05-20 DIAGNOSIS — K219 Gastro-esophageal reflux disease without esophagitis: Secondary | ICD-10-CM | POA: Diagnosis not present

## 2024-05-20 DIAGNOSIS — R6520 Severe sepsis without septic shock: Secondary | ICD-10-CM | POA: Diagnosis not present

## 2024-05-20 DIAGNOSIS — I517 Cardiomegaly: Secondary | ICD-10-CM | POA: Diagnosis not present

## 2024-05-20 DIAGNOSIS — Z79899 Other long term (current) drug therapy: Secondary | ICD-10-CM | POA: Diagnosis not present

## 2024-05-20 DIAGNOSIS — N400 Enlarged prostate without lower urinary tract symptoms: Secondary | ICD-10-CM | POA: Diagnosis not present

## 2024-05-20 DIAGNOSIS — I69351 Hemiplegia and hemiparesis following cerebral infarction affecting right dominant side: Secondary | ICD-10-CM | POA: Diagnosis not present

## 2024-05-20 DIAGNOSIS — Z7902 Long term (current) use of antithrombotics/antiplatelets: Secondary | ICD-10-CM | POA: Diagnosis not present

## 2024-05-20 DIAGNOSIS — Z1152 Encounter for screening for COVID-19: Secondary | ICD-10-CM

## 2024-05-20 DIAGNOSIS — N3000 Acute cystitis without hematuria: Secondary | ICD-10-CM | POA: Diagnosis not present

## 2024-05-20 DIAGNOSIS — E876 Hypokalemia: Secondary | ICD-10-CM | POA: Diagnosis present

## 2024-05-20 DIAGNOSIS — R410 Disorientation, unspecified: Secondary | ICD-10-CM | POA: Diagnosis not present

## 2024-05-20 DIAGNOSIS — G9341 Metabolic encephalopathy: Secondary | ICD-10-CM | POA: Diagnosis present

## 2024-05-20 DIAGNOSIS — F32A Depression, unspecified: Secondary | ICD-10-CM | POA: Diagnosis not present

## 2024-05-20 DIAGNOSIS — E782 Mixed hyperlipidemia: Secondary | ICD-10-CM | POA: Diagnosis not present

## 2024-05-20 DIAGNOSIS — R4182 Altered mental status, unspecified: Secondary | ICD-10-CM | POA: Diagnosis not present

## 2024-05-20 DIAGNOSIS — A419 Sepsis, unspecified organism: Secondary | ICD-10-CM | POA: Diagnosis not present

## 2024-05-20 DIAGNOSIS — I2510 Atherosclerotic heart disease of native coronary artery without angina pectoris: Secondary | ICD-10-CM | POA: Diagnosis present

## 2024-05-20 DIAGNOSIS — Z7409 Other reduced mobility: Secondary | ICD-10-CM | POA: Diagnosis present

## 2024-05-20 DIAGNOSIS — F419 Anxiety disorder, unspecified: Secondary | ICD-10-CM | POA: Diagnosis not present

## 2024-05-20 DIAGNOSIS — K5900 Constipation, unspecified: Secondary | ICD-10-CM | POA: Diagnosis not present

## 2024-05-20 DIAGNOSIS — I700 Atherosclerosis of aorta: Secondary | ICD-10-CM | POA: Diagnosis not present

## 2024-05-20 DIAGNOSIS — J180 Bronchopneumonia, unspecified organism: Secondary | ICD-10-CM | POA: Diagnosis not present

## 2024-05-20 DIAGNOSIS — J9811 Atelectasis: Secondary | ICD-10-CM | POA: Diagnosis not present

## 2024-05-20 DIAGNOSIS — H04123 Dry eye syndrome of bilateral lacrimal glands: Secondary | ICD-10-CM | POA: Diagnosis not present

## 2024-05-20 DIAGNOSIS — K5730 Diverticulosis of large intestine without perforation or abscess without bleeding: Secondary | ICD-10-CM | POA: Diagnosis not present

## 2024-05-20 DIAGNOSIS — J690 Pneumonitis due to inhalation of food and vomit: Secondary | ICD-10-CM | POA: Diagnosis present

## 2024-05-20 DIAGNOSIS — Z8673 Personal history of transient ischemic attack (TIA), and cerebral infarction without residual deficits: Secondary | ICD-10-CM

## 2024-05-20 DIAGNOSIS — K567 Ileus, unspecified: Secondary | ICD-10-CM | POA: Diagnosis not present

## 2024-05-20 DIAGNOSIS — I129 Hypertensive chronic kidney disease with stage 1 through stage 4 chronic kidney disease, or unspecified chronic kidney disease: Secondary | ICD-10-CM | POA: Diagnosis present

## 2024-05-20 DIAGNOSIS — E785 Hyperlipidemia, unspecified: Secondary | ICD-10-CM | POA: Diagnosis present

## 2024-05-20 DIAGNOSIS — N182 Chronic kidney disease, stage 2 (mild): Secondary | ICD-10-CM | POA: Diagnosis present

## 2024-05-20 DIAGNOSIS — R9089 Other abnormal findings on diagnostic imaging of central nervous system: Secondary | ICD-10-CM | POA: Diagnosis not present

## 2024-05-20 DIAGNOSIS — Z7401 Bed confinement status: Secondary | ICD-10-CM | POA: Diagnosis not present

## 2024-05-20 DIAGNOSIS — Z634 Disappearance and death of family member: Secondary | ICD-10-CM

## 2024-05-20 DIAGNOSIS — I10 Essential (primary) hypertension: Secondary | ICD-10-CM | POA: Diagnosis present

## 2024-05-20 DIAGNOSIS — R509 Fever, unspecified: Secondary | ICD-10-CM | POA: Diagnosis not present

## 2024-05-20 DIAGNOSIS — K8020 Calculus of gallbladder without cholecystitis without obstruction: Secondary | ICD-10-CM | POA: Diagnosis not present

## 2024-05-20 DIAGNOSIS — R1110 Vomiting, unspecified: Secondary | ICD-10-CM | POA: Diagnosis not present

## 2024-05-20 DIAGNOSIS — E43 Unspecified severe protein-calorie malnutrition: Secondary | ICD-10-CM | POA: Diagnosis not present

## 2024-05-20 DIAGNOSIS — N10 Acute tubulo-interstitial nephritis: Secondary | ICD-10-CM | POA: Diagnosis not present

## 2024-05-20 DIAGNOSIS — N179 Acute kidney failure, unspecified: Secondary | ICD-10-CM | POA: Diagnosis present

## 2024-05-20 DIAGNOSIS — R531 Weakness: Secondary | ICD-10-CM | POA: Diagnosis not present

## 2024-05-20 LAB — BASIC METABOLIC PANEL WITH GFR
Anion gap: 13 (ref 5–15)
BUN: 38 mg/dL — ABNORMAL HIGH (ref 8–23)
CO2: 22 mmol/L (ref 22–32)
Calcium: 8.9 mg/dL (ref 8.9–10.3)
Chloride: 100 mmol/L (ref 98–111)
Creatinine, Ser: 1.4 mg/dL — ABNORMAL HIGH (ref 0.61–1.24)
GFR, Estimated: 48 mL/min — ABNORMAL LOW (ref >60)
Glucose, Bld: 146 mg/dL — ABNORMAL HIGH (ref 70–99)
Potassium: 3.9 mmol/L (ref 3.5–5.1)
Sodium: 135 mmol/L (ref 135–145)

## 2024-05-20 LAB — HEPATIC FUNCTION PANEL
ALT: 13 U/L (ref 0–44)
AST: 16 U/L (ref 15–41)
Albumin: 3.2 g/dL — ABNORMAL LOW (ref 3.5–5.0)
Alkaline Phosphatase: 73 U/L (ref 38–126)
Bilirubin, Direct: 0.4 mg/dL — ABNORMAL HIGH (ref 0.0–0.2)
Indirect Bilirubin: 1.1 mg/dL — ABNORMAL HIGH (ref 0.3–0.9)
Total Bilirubin: 1.5 mg/dL — ABNORMAL HIGH (ref 0.0–1.2)
Total Protein: 7.8 g/dL (ref 6.5–8.1)

## 2024-05-20 LAB — CBC
HCT: 38.7 % — ABNORMAL LOW (ref 39.0–52.0)
Hemoglobin: 12.6 g/dL — ABNORMAL LOW (ref 13.0–17.0)
MCH: 30.2 pg (ref 26.0–34.0)
MCHC: 32.6 g/dL (ref 30.0–36.0)
MCV: 92.8 fL (ref 80.0–100.0)
Platelets: 251 K/uL (ref 150–400)
RBC: 4.17 MIL/uL — ABNORMAL LOW (ref 4.22–5.81)
RDW: 15.1 % (ref 11.5–15.5)
WBC: 33.3 K/uL — ABNORMAL HIGH (ref 4.0–10.5)
nRBC: 0 % (ref 0.0–0.2)

## 2024-05-20 LAB — URINALYSIS, ROUTINE W REFLEX MICROSCOPIC
Bilirubin Urine: NEGATIVE
Glucose, UA: NEGATIVE mg/dL
Ketones, ur: NEGATIVE mg/dL
Nitrite: NEGATIVE
Protein, ur: 100 mg/dL — AB
RBC / HPF: >50 RBC/hpf (ref 0–5)
Specific Gravity, Urine: 1.03 (ref 1.005–1.030)
WBC, UA: >50 WBC/hpf (ref 0–5)
pH: 5 (ref 5.0–8.0)

## 2024-05-20 LAB — TROPONIN I (HIGH SENSITIVITY)
Troponin I (High Sensitivity): 11 ng/L (ref <18)
Troponin I (High Sensitivity): 10 ng/L (ref <18)

## 2024-05-20 LAB — RESP PANEL BY RT-PCR (RSV, FLU A&B, COVID)  RVPGX2
Influenza A by PCR: NEGATIVE
Influenza B by PCR: NEGATIVE
Resp Syncytial Virus by PCR: NEGATIVE
SARS Coronavirus 2 by RT PCR: NEGATIVE

## 2024-05-20 LAB — PROTIME-INR
INR: 1.2 (ref 0.8–1.2)
Prothrombin Time: 15.8 s — ABNORMAL HIGH (ref 11.4–15.2)

## 2024-05-20 LAB — I-STAT CG4 LACTIC ACID, ED: Lactic Acid, Venous: 1.1 mmol/L (ref 0.5–1.9)

## 2024-05-20 MED ORDER — PANTOPRAZOLE SODIUM 40 MG PO TBEC
40.0000 mg | DELAYED_RELEASE_TABLET | Freq: Every day | ORAL | Status: DC
  Administered 2024-05-21 – 2024-05-23 (×3): 40 mg via ORAL
  Filled 2024-05-20 (×3): qty 1

## 2024-05-20 MED ORDER — SODIUM CHLORIDE 0.9 % IV BOLUS
1000.0000 mL | Freq: Once | INTRAVENOUS | Status: AC
  Administered 2024-05-20: 1000 mL via INTRAVENOUS

## 2024-05-20 MED ORDER — LACTATED RINGERS IV SOLN
150.0000 mL/h | INTRAVENOUS | Status: DC
  Administered 2024-05-21: 150 mL/h via INTRAVENOUS

## 2024-05-20 MED ORDER — SERTRALINE HCL 50 MG PO TABS
50.0000 mg | ORAL_TABLET | Freq: Every day | ORAL | Status: DC
  Administered 2024-05-21 – 2024-05-27 (×8): 50 mg via ORAL
  Filled 2024-05-20 (×8): qty 1

## 2024-05-20 MED ORDER — SODIUM CHLORIDE 0.9 % IV BOLUS
1000.0000 mL | Freq: Once | INTRAVENOUS | Status: AC
  Administered 2024-05-21: 1000 mL via INTRAVENOUS

## 2024-05-20 MED ORDER — SODIUM CHLORIDE 0.9 % IV SOLN: 2.0000 g | Freq: Once | INTRAVENOUS | Status: DC

## 2024-05-20 MED ORDER — SODIUM CHLORIDE 0.9 % IV BOLUS
500.0000 mL | Freq: Once | INTRAVENOUS | Status: AC
  Administered 2024-05-21: 500 mL via INTRAVENOUS

## 2024-05-20 MED ORDER — METOPROLOL SUCCINATE ER 25 MG PO TB24
12.5000 mg | ORAL_TABLET | Freq: Every day | ORAL | Status: DC
  Administered 2024-05-22 – 2024-05-28 (×7): 12.5 mg via ORAL
  Filled 2024-05-20 (×8): qty 1

## 2024-05-20 MED ORDER — ENOXAPARIN SODIUM 40 MG/0.4ML IJ SOSY
40.0000 mg | PREFILLED_SYRINGE | Freq: Every day | INTRAMUSCULAR | Status: DC
  Administered 2024-05-21 – 2024-05-28 (×8): 40 mg via SUBCUTANEOUS
  Filled 2024-05-20 (×8): qty 0.4

## 2024-05-20 MED ORDER — ONDANSETRON HCL 4 MG/2ML IJ SOLN
4.0000 mg | Freq: Four times a day (QID) | INTRAMUSCULAR | Status: DC | PRN
  Administered 2024-05-23: 4 mg via INTRAVENOUS
  Filled 2024-05-20: qty 2

## 2024-05-20 MED ORDER — SODIUM CHLORIDE 0.9 % IV SOLN
500.0000 mg | Freq: Once | INTRAVENOUS | Status: AC
  Administered 2024-05-20: 500 mg via INTRAVENOUS
  Filled 2024-05-20: qty 5

## 2024-05-20 MED ORDER — SODIUM CHLORIDE 0.9 % IV SOLN
2.0000 g | INTRAVENOUS | Status: DC
  Administered 2024-05-21: 2 g via INTRAVENOUS
  Filled 2024-05-20: qty 12.5

## 2024-05-20 MED ORDER — SODIUM CHLORIDE 0.9 % IV SOLN
2.0000 g | Freq: Once | INTRAVENOUS | Status: AC
  Administered 2024-05-20: 2 g via INTRAVENOUS
  Filled 2024-05-20: qty 20

## 2024-05-20 MED ORDER — SODIUM CHLORIDE 0.9 % IV BOLUS
500.0000 mL | Freq: Once | INTRAVENOUS | Status: AC
  Administered 2024-05-20: 500 mL via INTRAVENOUS

## 2024-05-20 MED ORDER — ONDANSETRON HCL 4 MG PO TABS: 4.0000 mg | ORAL_TABLET | Freq: Four times a day (QID) | ORAL | Status: DC | PRN

## 2024-05-20 MED ORDER — MIRTAZAPINE 15 MG PO TABS
15.0000 mg | ORAL_TABLET | Freq: Every day | ORAL | Status: DC
Start: 2024-05-21 — End: 2024-05-22
  Administered 2024-05-21 (×2): 15 mg via ORAL
  Filled 2024-05-20 (×2): qty 1

## 2024-05-20 MED ORDER — SODIUM CHLORIDE 0.9 % IV SOLN: 2.0000 g | Freq: Two times a day (BID) | INTRAVENOUS | Status: DC

## 2024-05-20 NOTE — ED Notes (Addendum)
 Phlebotomy asked to stick

## 2024-05-20 NOTE — ED Notes (Signed)
 Kaitlyn in lab called regarding pt UA. Kaityln informed me that tubes have been getting stuck in tubing system. Explained to Kaitlyn that UA was sent with Covid/RSV panel and the results were in for that. Awaiting UA results.

## 2024-05-20 NOTE — ED Notes (Signed)
 Dr.Mohommad has seen pt. Asked MD if pt will still be going to med surg. MD responded with yes. Plan of care is IV hydration, etc per MD

## 2024-05-20 NOTE — ED Notes (Signed)
 1st lac 1.13 within normal range, 2nd not needed can be canceled

## 2024-05-20 NOTE — ED Provider Notes (Signed)
 [place] [facility]GENCY DEPARTMENT AT [facility] Provider Note   CSN: [id number] Arrival date & time: 05/20/24  1702     Patient presents with: Fever, Nausea, Urinary Tract Infection, and Altered Mental Status   Clarence Myers is a 89 y.o. male with past medical history of hyperlipidemia, hypertension, coronary artery disease, glaucoma presents to emergency room with complaint of fever, nausea vomiting dysuria and confusion since Monday.  Had a fever of 100F at home which came down with Tylenol .  Was seen at urgent care and diagnosed with urinary tract infection sent here due to flank pain, concerned about pyelonephritis and sepsis.    Fever Urinary Tract Infection Associated symptoms: fever   Altered Mental Status Associated symptoms: fever        Prior to Admission medications   Medication Sig Start Date End Date Taking? Authorizing Provider  acetaminophen  (TYLENOL ) 325 MG tablet Take 2 tablets (650 mg total) by mouth every 4 (four) hours as needed for mild pain (or temp > 37.5 C (99.5 F)). 10/09/21   Danton Reyes DASEN, MD  clopidogrel  (PLAVIX ) 75 MG tablet Take 1 tablet (75 mg total) by mouth daily. RESUME ONLY AFTER COMPLETION OF 30 DAYS OF ASPIRIN  AND BRILINTA  Patient taking differently: Take 75 mg by mouth daily. 01/31/22   Krishnan, Gokul, MD  latanoprost  (XALATAN ) 0.005 % ophthalmic solution Place 1 drop into both eyes at bedtime. 01/14/24   Samtani, Jai-Gurmukh, MD  metoprolol  succinate (TOPROL  XL) 25 MG 24 hr tablet Take 0.5 tablets (12.5 mg total) by mouth daily. 01/14/24   Samtani, Jai-Gurmukh, MD  mirtazapine  (REMERON ) 15 MG tablet Take 15 mg by mouth at bedtime. 11/13/21   [provider]  Multiple Vitamin (MULTI-VITAMIN PO) Take 30 mLs by mouth daily with breakfast.    [provider]  multivitamin-lutein  (OCUVITE-LUTEIN ) CAPS Take 1 capsule by mouth daily.      [provider]  Omega-3 Fatty Acids (FISH OIL PO) Take 1,000 mg by mouth  in the morning and at bedtime.    [provider]  omeprazole (PRILOSEC) 20 MG capsule Take 20 mg by mouth daily before breakfast. 01/21/21   [provider]  Probiotic Product (PROBIOTIC DAILY PO) Take 1 capsule by mouth daily.    [provider]  RESTASIS  0.05 % ophthalmic emulsion Place 1 drop into both eyes 2 (two) times daily. 10/25/20   [provider]  rosuvastatin  (CRESTOR ) 20 MG tablet Take 1 tablet (20 mg total) by mouth daily. Patient taking differently: Take 20 mg by mouth at bedtime. 12/31/21   Krishnan, Gokul, MD  saccharomyces boulardii (FLORASTOR) 250 MG capsule Take 1 capsule by mouth daily.    [provider]  sertraline  (ZOLOFT ) 25 MG tablet Take 2 tablets (50 mg total) by mouth at bedtime. 01/14/24   Samtani, Jai-Gurmukh, MD    Allergies: Bee venom, Blue crab (cavinectes sapidus) allergy skin test, and Shellfish-derived products    Review of Systems  Constitutional:  Positive for fever.    Updated Vital Signs BP 112/62   Pulse 92   Temp 98.7 F (37.1 C)   Resp 17   Ht 5' 7 (1.702 m)   Wt 61.2 kg   SpO2 93%   BMI 21.13 kg/m   Physical Exam Vitals and nursing note reviewed.  Constitutional:      General: He is not in acute distress.    Appearance: He is not toxic-appearing.  HENT:     Head: Normocephalic and atraumatic.  Eyes:     General: No scleral icterus.    Conjunctiva/sclera: Conjunctivae normal.  Cardiovascular:     Rate and Rhythm: Normal rate and regular rhythm.     Pulses: Normal pulses.     Heart sounds: Normal heart sounds.  Pulmonary:     Effort: Pulmonary effort is normal. No respiratory distress.     Breath sounds: Normal breath sounds.  Abdominal:     General: Abdomen is flat. Bowel sounds are normal.     Palpations: Abdomen is soft.     Tenderness: There is no abdominal tenderness. There is no right CVA tenderness or left CVA tenderness.  Musculoskeletal:     Right lower leg: No edema.      Left lower leg: No edema.  Skin:    General: Skin is warm and dry.     Findings: No lesion.  Neurological:     General: No focal deficit present.     Mental Status: He is alert and oriented to person, place, and time. Mental status is at baseline.     (all labs ordered are listed, but only abnormal results are displayed) Labs Reviewed  BASIC METABOLIC PANEL WITH GFR - Abnormal; Notable for the following components:      Result Value   Glucose, Bld 146 (*)    BUN 38 (*)    Creatinine, Ser 1.40 (*)    GFR, Estimated 48 (*)    All other components within normal limits  CBC - Abnormal; Notable for the following components:   WBC 33.3 (*)    RBC 4.17 (*)    Hemoglobin 12.6 (*)    HCT 38.7 (*)    All other components within normal limits  HEPATIC FUNCTION PANEL - Abnormal; Notable for the following components:   Albumin 3.2 (*)    Total Bilirubin 1.5 (*)    Bilirubin, Direct 0.4 (*)    Indirect Bilirubin 1.1 (*)    All other components within normal limits  PROTIME-INR - Abnormal; Notable for the following components:   Prothrombin Time 15.8 (*)    All other components within normal limits  RESP PANEL BY RT-PCR (RSV, FLU A&B, COVID)  RVPGX2  CULTURE, BLOOD (ROUTINE X 2)  CULTURE, BLOOD (ROUTINE X 2)  URINALYSIS, ROUTINE W REFLEX MICROSCOPIC  I-STAT CG4 LACTIC ACID, ED  TROPONIN I (HIGH SENSITIVITY)  TROPONIN I (HIGH SENSITIVITY)    EKG: EKG Interpretation Date/Time:  Wednesday May 20 2024 17:32:40 EDT Ventricular Rate:  95 PR Interval:  156 QRS Duration:  88 QT Interval:  350 QTC Calculation: 439 R Axis:   -27  Text Interpretation: Normal sinus rhythm Nonspecific ST and T wave abnormality Abnormal ECG Interpretation limited secondary to artifact Confirmed by Midge Golas ([id number]) on 05/20/2024 6:06:55 PM  Radiology: [facility] Chest Portable 1 View Result Date: 05/20/2024 CLINICAL DATA:  confusion  4 days elevated temp today blood urine EXAM: PORTABLE CHEST - 1 VIEW  COMPARISON:  05/02/2024 FINDINGS: Streaky opacities in the right lung base. No pleural effusion or pneumothorax. No cardiomegaly. Aortic atherosclerosis. No acute fracture or destructive lesions. Multilevel thoracic osteophytosis. IMPRESSION: Streaky opacities in the right lung base, likely atelectasis. Alternatively, this could reflect a developing bronchopneumonia, in the correct clinical context. Electronically Signed   By: Rogelia Myers M.D.   On: 05/20/2024 18:49     Procedures   Medications Ordered in the ED  sodium chloride  0.9 % bolus 1,000 mL (has no administration in time range)  cefTRIAXone  (ROCEPHIN ) 2 g  in sodium chloride  0.9 % 100 mL IVPB (has no administration in time range)                                    Medical Decision Making Amount and/or Complexity of Data Reviewed Labs: ordered. Radiology: ordered.  Risk Decision regarding hospitalization.   This patient presents to the ED for concern of fever/AMS, this involves an extensive number of treatment options, and is a complaint that carries with it a high risk of complications and morbidity.  The differential diagnosis includes intercranial abnormality, viral URI, pneumonia, urinary tract infection, pyelonephritis, dehydration, electrolyte abnormality   Co morbidities that complicate the patient evaluation  Hyperlipidemia, hypertension, coronary artery disease   Additional history obtained:  Additional history obtained from patient ED visit after fall with head injury 05/02/2024   Lab Tests:  I personally interpreted labs.  The pertinent results include:   CBC with leukocytosis of 33, hemoglobin is 12.6 BMP of 1.4 elevated from 0.8 which appears to be patient's baseline Blood cultures pending Patient's initial lactic within normal limits respiratory panel within normal limits, troponin 10.  UA obtained but apparently lost. Family declines repeat foley cath, but are agreeable to condom cath if needed.     Imaging Studies ordered:  I ordered imaging studies including chest x-ray I independently visualized and interpreted imaging which showed streaky opacity in right lung base, differential would include bronchial pneumonia. I agree with the radiologist interpretation   Cardiac Monitoring: / EKG:  The patient was maintained on a cardiac monitor.  I personally viewed and interpreted the cardiac monitored which showed an underlying rhythm of: normal sinus, nonspecific ST changes   Consultations Obtained:  I requested consultation with the hospitalist,  and discussed lab and imaging findings as well as pertinent plan - they will admit.    Problem List / ED Course / Critical interventions / Medication management  Presents with confusion, fever, nausea vomiting and dysuria.  He had confirmed urinary tract infection and blood in the urine today with [facility].  Given fever at the urgent care he was sent here for further workup.  On my exam he endorses mild headache and dysuria. Has baseline right upper extremities and right lower extremity weakness, but no worsening focal deficits. Abdomen soft and non tender.  He denies any back or flank pain.  His workup is significant for a leukocytosis of 33, he has mild AKI which I suspect is prerenal.  His x-ray does show possible bronchial pneumonia however he is denying chest pain shortness of breath or cough so I will lower suspicion.  Will opt to treat however I feel UTI is more likely cause of fever at this time.  Did have slightly downtrending blood pressure thus we will repeat saline bolus of 500cc.  Given fever leukocytosis confusion will consult hospital team as I feel he requires admission. I ordered medication including will start azithromycin , Rocephin  given chest x-ray finding.  Given 1 L normal saline. Reevaluation of the patient after these medicines showed that the patient improved         Final diagnoses:  Confusion  Fever,  unspecified fever cause  Acute cystitis with hematuria    ED Discharge Orders     None          Shermon Warren SAILOR, PA-C 05/20/24 2246    Midge Golas, MD 05/21/24 0002

## 2024-05-20 NOTE — ED Provider Triage Note (Signed)
 Emergency Medicine Provider Triage Evaluation Note  Clarence Myers , a 89 y.o. male  was evaluated in triage.  Pt complains of confusion onset 4 days ago (didn't know where he was, wife died 06-24-24). Nausea, vomiting, temp 100 last night. Dysuria onset yesterday. With hematuria, ?retention. Tylenol  at home at 9am.  Review of Systems  Positive:  Negative:   Physical Exam  BP 112/62   Pulse 92   Temp 98.7 F (37.1 C)   Resp 17   Ht 5' 7 (1.702 m)   Wt 61.2 kg   SpO2 93%   BMI 21.13 kg/m  Gen:   Awake, no distress, appears to feel unwell   Resp:  Normal effort  MSK:   Moves extremities without difficulty  Other:    Medical Decision Making  Medically screening exam initiated at 6:00 PM.  Appropriate orders placed.  Clarence Myers was informed that the remainder of the evaluation will be completed by another provider, this initial triage assessment does not replace that evaluation, and the importance of remaining in the ED until their evaluation is complete.     Beverley Leita LABOR, PA-C 05/20/24 [phone]

## 2024-05-20 NOTE — ED Notes (Signed)
 Straight cath performed on pt. Urine appears to be dark orange color. Pt tolerated well

## 2024-05-20 NOTE — Progress Notes (Addendum)
 Pharmacy Antibiotic Note  Clarence Myers is a 89 y.o. male for which pharmacy has been consulted for cefepime  dosing for UTI.  Estimated Creatinine Clearance: 31.6 mL/min (A) (by C-G formula based on SCr of 1.4 mg/dL (H)).   Plan: Cefepime  2g q24h for UTI indication Monitor WBC, fever, renal function, cultures De-escalate when able  Height: 5' 7 (170.2 cm) Weight: 61.2 kg (134 lb 14.7 oz) IBW/kg (Calculated) : 66.1  Temp (24hrs), Avg:98.5 F (36.9 C), Min:98.3 F (36.8 C), Max:98.7 F (37.1 C)  Recent Labs  Lab 05/20/24 1719 05/20/24 1949  WBC 33.3*  --   CREATININE 1.40*  --   LATICACIDVEN  --  1.1    Estimated Creatinine Clearance: 31.6 mL/min (A) (by C-G formula based on SCr of 1.4 mg/dL (H)).    Allergies  Allergen Reactions   Bee Venom Anaphylaxis and Swelling   Blue Crab (Cavinectes Sapidus) Allergy Skin Test Hives, Swelling, Other (See Comments) and Cough   Shellfish-Derived Products Hives    Microbiology results: Pending  Thank you for allowing pharmacy to be a part of this patient's care.  Dorn Buttner, PharmD, BCPS 05/20/2024 11:24 PM ED Clinical Pharmacist -  [phone]

## 2024-05-20 NOTE — H&P (Signed)
 History and Physical    Patient: Clarence Myers FMW:[id number] DOB: Oct 08, 1933 DOA: 05/20/2024 DOS: the patient was seen and examined on 05/20/2024 PCP: Loreli Kins, MD  Patient coming from: Home  Chief Complaint:  Chief Complaint  Patient presents with   Fever   Nausea   Urinary Tract Infection   Altered Mental Status   HPI: Clarence Myers is a 89 y.o. male with medical history significant of history of prior CVA, coronary artery disease, hyperlipidemia, low back pain, GERD, depression with anxiety, essential hypertension who was brought in by family secondary to confusion, fever, nausea with vomiting as well as dysuria over the last few days.  He has decreased oral intake and has not been eating or drinking appropriately.  Patient was seen in the urgent care center [facility] where he was evaluated.  Urinalysis was positive for UTI.  Patient additionally was suspected to have pneumonia.  His temperature at home was 100.  He was given Tylenol  prior to coming to the ER patient remains afebrile however he meets sepsis criteria with leukocytosis and tachycardia.  Attempted urinalysis in the ER and other workup was resisted by the family due to discomfort.  Patient has no history of obstructive uropathy but is too dry and not able to make urine adequately.  He is also hypotensive with systolic in the 80s to 90s.  At this point he appears to have severe sepsis probably due to UTI.  Urgent care center has sent out urine cultures.  Will admit the patient for treatment.  Will hopefully will follow-up with the cultures from the urgent care center.  Review of Systems: As mentioned in the history of present illness. All other systems reviewed and are negative. Past Medical History:  Diagnosis Date   Acute ischemic stroke (HCC) 12/30/2021   CAD (coronary artery disease)    CVA (cerebral vascular accident) (HCC) 09/29/2021   Hyperlipidemia    Hypokalemia 09/29/2021   Low back strain    Shoulder  injury    Fell and injured Right shoulder   Stroke [facility])    Past Surgical History:  Procedure Laterality Date   ANGIOPLASTY     1992-LAD   CARDIAC CATHETERIZATION     Social History:  reports that he has never smoked. He has never used smokeless tobacco. He reports that he does not drink alcohol and does not use drugs.  Allergies  Allergen Reactions   Bee Venom Anaphylaxis and Swelling   Blue Crab (Cavinectes Sapidus) Allergy Skin Test Hives, Swelling, Other (See Comments) and Cough   Shellfish-Derived Products Hives    History reviewed. No pertinent family history.  Prior to Admission medications   Medication Sig Start Date End Date Taking? Authorizing Provider  acetaminophen  (TYLENOL ) 325 MG tablet Take 2 tablets (650 mg total) by mouth every 4 (four) hours as needed for mild pain (or temp > 37.5 C (99.5 F)). 10/09/21   Danton Reyes DASEN, MD  clopidogrel  (PLAVIX ) 75 MG tablet Take 1 tablet (75 mg total) by mouth daily. RESUME ONLY AFTER COMPLETION OF 30 DAYS OF ASPIRIN  AND BRILINTA  Patient taking differently: Take 75 mg by mouth daily. 01/31/22   Krishnan, Gokul, MD  latanoprost  (XALATAN ) 0.005 % ophthalmic solution Place 1 drop into both eyes at bedtime. 01/14/24   Samtani, Jai-Gurmukh, MD  metoprolol  succinate (TOPROL  XL) 25 MG 24 hr tablet Take 0.5 tablets (12.5 mg total) by mouth daily. 01/14/24   Samtani, Jai-Gurmukh, MD  mirtazapine  (REMERON ) 15 MG tablet Take 15 mg  by mouth at bedtime. 11/13/21   [provider]  Multiple Vitamin (MULTI-VITAMIN PO) Take 30 mLs by mouth daily with breakfast.    [provider]  multivitamin-lutein  (OCUVITE-LUTEIN ) CAPS Take 1 capsule by mouth daily.      [provider]  Omega-3 Fatty Acids (FISH OIL PO) Take 1,000 mg by mouth in the morning and at bedtime.    [provider]  omeprazole (PRILOSEC) 20 MG capsule Take 20 mg by mouth daily before breakfast. 01/21/21   [provider]  Probiotic Product  (PROBIOTIC DAILY PO) Take 1 capsule by mouth daily.    [provider]  RESTASIS  0.05 % ophthalmic emulsion Place 1 drop into both eyes 2 (two) times daily. 10/25/20   [provider]  rosuvastatin  (CRESTOR ) 20 MG tablet Take 1 tablet (20 mg total) by mouth daily. Patient taking differently: Take 20 mg by mouth at bedtime. 12/31/21   Krishnan, Gokul, MD  saccharomyces boulardii (FLORASTOR) 250 MG capsule Take 1 capsule by mouth daily.    [provider]  sertraline  (ZOLOFT ) 25 MG tablet Take 2 tablets (50 mg total) by mouth at bedtime. 01/14/24   Royal Sill, MD    Physical Exam: Vitals:   05/20/24 2203 05/20/24 2215 05/20/24 2230 05/20/24 2245  BP:  (!) 97/54 (!) 85/44 (!) 84/46  Pulse:  79 78 81  Resp:  (!) 26 (!) 24 (!) 27  Temp: 98.3 F (36.8 C)     TempSrc: Oral     SpO2:  97% 96% 97%  Weight:      Height:       Constitutional: Chronically ill looking, weak, dehydrated NAD, calm, comfortable Eyes: PERRL, lids and conjunctivae normal ENMT: Mucous membranes are dry. Posterior pharynx clear of any exudate or lesions.Normal dentition.  Neck: normal, supple, no masses, no thyromegaly Respiratory: clear to auscultation bilaterally, no wheezing, no crackles. Normal respiratory effort. No accessory muscle use.  Cardiovascular: Regular rate and rhythm, no murmurs / rubs / gallops. No extremity edema. 2+ pedal pulses. No carotid bruits.  Abdomen: no tenderness, no masses palpated. No hepatosplenomegaly. Bowel sounds positive.  Musculoskeletal: Good range of motion, no joint swelling or tenderness, Skin: no rashes, lesions, ulcers. No induration Neurologic: CN 2-12 grossly intact. Sensation intact, DTR normal. Strength 5/5 in all 4.  Psychiatric: Drowsy  Data Reviewed:  Temperature 98.7, blood pressure 84/46, pulse 92 respiratory rate of 28 and oxygen  sats 93% room air.  White count is 33,000 hemoglobin 12.6, BUN 30 creatinine 1.40 and glucose 146  urinalysis showed turbid urine WBC more than 50 rare bacteria and positive leukocytes.  Blood cultures have been obtained.  Chest x-ray showed streaky opacity in the right lung base likely atelectasis or developing bronchopneumonia.  EKG showed sinus rhythm.  Lactic acid is 1.1.  Assessment and Plan:  #1 severe sepsis due to UTI and possible pneumonia: Patient will be admitted.  Initiate the sepsis protocol.  IV hydration.  IV antibiotics.  Follow-up blood cultures and urine culture results.  #2 UTI: As per above.  Continue to monitor.  #3 possible bronchopneumonia: Continue antibiotics.  Also oxygen .  #4 GERD: Continues PPIs  #5 hyperlipidemia: Continue statin  #6 history of CVA: Stable.  No acute exacerbation.  #7 essential hypertension: Continue blood pressure medications.  #8 coronary artery disease: Stable  #9 AKI on CKD 2: Continue IV fluids.   Advance Care Planning:   Code Status: Full Code   Consults: None  Family Communication: Daughter at  bedside  Severity of Illness: The appropriate patient status for this patient is INPATIENT. Inpatient status is judged to be reasonable and necessary in order to provide the required intensity of service to ensure the patient's safety. The patient's presenting symptoms, physical exam findings, and initial radiographic and laboratory data in the context of their chronic comorbidities is felt to place them at high risk for further clinical deterioration. Furthermore, it is not anticipated that the patient will be medically stable for discharge from the hospital within 2 midnights of admission.   * I certify that at the point of admission it is my clinical judgment that the patient will require inpatient hospital care spanning beyond 2 midnights from the point of admission due to high intensity of service, high risk for further deterioration and high frequency of surveillance required.*  AuthorBETHA SIM KNOLL, MD 05/20/2024 11:23 PM  For on  call review [email or web address].

## 2024-05-20 NOTE — ED Triage Notes (Addendum)
 PT arrives via POV with his daughter. She reports patient has been experiencing confusion, fever, nausea, vomiting, and dysuria over the past few days. Pt is AxO to name, dob, and location only. Daughter states temp was 100.0 earlier. Pt did have meds for his fever  Pt was sent to ED by [facility].

## 2024-05-21 DIAGNOSIS — N182 Chronic kidney disease, stage 2 (mild): Secondary | ICD-10-CM

## 2024-05-21 DIAGNOSIS — E782 Mixed hyperlipidemia: Secondary | ICD-10-CM

## 2024-05-21 DIAGNOSIS — K219 Gastro-esophageal reflux disease without esophagitis: Secondary | ICD-10-CM

## 2024-05-21 DIAGNOSIS — N179 Acute kidney failure, unspecified: Secondary | ICD-10-CM | POA: Diagnosis not present

## 2024-05-21 DIAGNOSIS — I10 Essential (primary) hypertension: Secondary | ICD-10-CM | POA: Diagnosis not present

## 2024-05-21 DIAGNOSIS — N3000 Acute cystitis without hematuria: Secondary | ICD-10-CM | POA: Diagnosis not present

## 2024-05-21 LAB — COMPREHENSIVE METABOLIC PANEL WITH GFR
ALT: 7 U/L (ref 0–44)
AST: 10 U/L — ABNORMAL LOW (ref 15–41)
Albumin: 1.6 g/dL — ABNORMAL LOW (ref 3.5–5.0)
Alkaline Phosphatase: 38 U/L (ref 38–126)
Anion gap: 10 (ref 5–15)
BUN: 22 mg/dL (ref 8–23)
CO2: 16 mmol/L — ABNORMAL LOW (ref 22–32)
Calcium: 7.2 mg/dL — ABNORMAL LOW (ref 8.9–10.3)
Chloride: 108 mmol/L (ref 98–111)
Creatinine, Ser: 0.79 mg/dL (ref 0.61–1.24)
GFR, Estimated: >60 mL/min (ref >60)
Glucose, Bld: 100 mg/dL — ABNORMAL HIGH (ref 70–99)
Potassium: 3.7 mmol/L (ref 3.5–5.1)
Sodium: 134 mmol/L — ABNORMAL LOW (ref 135–145)
Total Bilirubin: 0.5 mg/dL (ref 0.0–1.2)
Total Protein: 4.1 g/dL — ABNORMAL LOW (ref 6.5–8.1)

## 2024-05-21 LAB — CBC
HCT: 25.5 % — ABNORMAL LOW (ref 39.0–52.0)
HCT: 30.7 % — ABNORMAL LOW (ref 39.0–52.0)
Hemoglobin: 10.1 g/dL — ABNORMAL LOW (ref 13.0–17.0)
Hemoglobin: 8.1 g/dL — ABNORMAL LOW (ref 13.0–17.0)
MCH: 30.2 pg (ref 26.0–34.0)
MCH: 30.5 pg (ref 26.0–34.0)
MCHC: 31.8 g/dL (ref 30.0–36.0)
MCHC: 32.9 g/dL (ref 30.0–36.0)
MCV: 92.7 fL (ref 80.0–100.0)
MCV: 95.1 fL (ref 80.0–100.0)
Platelets: 138 K/uL — ABNORMAL LOW (ref 150–400)
Platelets: 200 K/uL (ref 150–400)
RBC: 2.68 MIL/uL — ABNORMAL LOW (ref 4.22–5.81)
RBC: 3.31 MIL/uL — ABNORMAL LOW (ref 4.22–5.81)
RDW: 15.2 % (ref 11.5–15.5)
RDW: 15.4 % (ref 11.5–15.5)
WBC: 18.3 K/uL — ABNORMAL HIGH (ref 4.0–10.5)
WBC: 29.4 K/uL — ABNORMAL HIGH (ref 4.0–10.5)
nRBC: 0 % (ref 0.0–0.2)
nRBC: 0 % (ref 0.0–0.2)

## 2024-05-21 LAB — PROTIME-INR
INR: 1.9 — ABNORMAL HIGH (ref 0.8–1.2)
Prothrombin Time: 23.1 s — ABNORMAL HIGH (ref 11.4–15.2)

## 2024-05-21 LAB — CORTISOL-AM, BLOOD: Cortisol - AM: 11 ug/dL (ref 6.7–22.6)

## 2024-05-21 LAB — CREATININE, SERUM
Creatinine, Ser: 1.25 mg/dL — ABNORMAL HIGH (ref 0.61–1.24)
GFR, Estimated: 55 mL/min — ABNORMAL LOW (ref >60)

## 2024-05-21 LAB — I-STAT CG4 LACTIC ACID, ED: Lactic Acid, Venous: 1.1 mmol/L (ref 0.5–1.9)

## 2024-05-21 MED ORDER — LACTATED RINGERS IV SOLN: INTRAVENOUS | Status: AC

## 2024-05-21 MED ORDER — SODIUM CHLORIDE 0.9 % IV SOLN
2.0000 g | Freq: Two times a day (BID) | INTRAVENOUS | Status: DC
  Administered 2024-05-21 (×2): 2 g via INTRAVENOUS
  Filled 2024-05-21 (×2): qty 12.5

## 2024-05-21 MED ORDER — MIDODRINE HCL 5 MG PO TABS
5.0000 mg | ORAL_TABLET | Freq: Three times a day (TID) | ORAL | Status: DC
  Administered 2024-05-21 – 2024-05-25 (×9): 5 mg via ORAL
  Filled 2024-05-21 (×12): qty 1

## 2024-05-21 MED ORDER — CHLORPROMAZINE HCL 25 MG PO TABS
25.0000 mg | ORAL_TABLET | Freq: Three times a day (TID) | ORAL | Status: DC | PRN
  Administered 2024-05-21 – 2024-05-22 (×3): 25 mg via ORAL
  Filled 2024-05-21 (×5): qty 1

## 2024-05-21 NOTE — Plan of Care (Signed)
  Problem: Fluid Volume: Goal: Hemodynamic stability will improve Outcome: Progressing   Problem: Clinical Measurements: Goal: Diagnostic test results will improve Outcome: Progressing   Problem: Respiratory: Goal: Ability to maintain adequate ventilation will improve Outcome: Progressing

## 2024-05-21 NOTE — ED Notes (Signed)
 Attempted to collect 5am lab work. Pt very agitated. Asked daughter to hold hand while attempting to draw lab off IV site. Pt stated that it hurts when I touch him. Daughter made me stop. Stated she does not want me to collect pt 5am vitals because it is obvious he is hurting. Daughter stated that IV should have been placed in the [facility] rather than the hand.

## 2024-05-21 NOTE — Evaluation (Addendum)
 Clinical/Bedside Swallow Evaluation Patient Details  Name: Clarence Myers MRN: [id number] Date of Birth: [birth year]  Today's Date: 05/21/2024 Time: SLP Start Time (ACUTE ONLY): 1457 SLP Stop Time (ACUTE ONLY): 1525 SLP Time Calculation (min) (ACUTE ONLY): 28 min  Past Medical History:  Past Medical History:  Diagnosis Date   Acute ischemic stroke (HCC) 12/30/2021   CAD (coronary artery disease)    CVA (cerebral vascular accident) (HCC) 09/29/2021   Hyperlipidemia    Hypokalemia 09/29/2021   Low back strain    Shoulder injury    Fell and injured Right shoulder   Stroke [facility])    Past Surgical History:  Past Surgical History:  Procedure Laterality Date   ANGIOPLASTY     1992-LAD   CARDIAC CATHETERIZATION     HPI:  89 yo male presenting 10/1 with AMS, fever, N/V, and dysuria. Admitted with severe sepsis from UTI and possible PNA. Pt has been seen by SLP team over the last two years during multiple admissions. He was recommended to have nectar thick liquids in 2023, with which he showed improved coordination and safety compared to with thin liquids, which were aspirated. A breath hold before the swallow with thin liquids improved airway protection but pt could not consistently implement it. Repeat MBS in May 2025 was [facility] and pt has been back on thin liquids since that time. PMH includes: CVA, GERD, CAD, HLD, low back pain, depression with anxiety, essential HTN    Assessment / Plan / Recommendation  Clinical Impression  Pt has a h/o dysphagia including admissions with concern for aspiration PNA, but per pt and family, has been doing well without diet modifications at home since most recent admission in June. They also note that coughing with water earlier happened when pt was given a pill with thin liquids (per nurse also maybe larger volume of liquid), but at home he always takes his pills with applesauce. Pt had some mild, cognitively-based oral dysphagia during PO trials with SLP,  continuously pursing his lips for boluses despite presentation and verbal/tactile cueing to open his mouth to accept a spoon or a piece of cracker. However, once in his oral cavity, oropharyngeal swallow appeared to be functional. Given his h/o dysphagia and possible PNA this admission, discussed option to pursue MBS. Pt prefers to hold off on this, and family is in agreement, acknowledging that he has been doing well at home. SLP provided education to reinforce aspiration precautions, also suggesting that he avoid mixed consistencies especially while acutely deconditioned and with AMS. Will leave on current diet but would give meds in puree. SLP will f/u at least briefly.   SLP Visit Diagnosis: Dysphagia, unspecified (R13.10)    Aspiration Risk       Diet Recommendation Regular;Thin liquid    Liquid Administration via: Cup;Straw Medication Administration: Whole meds with puree Supervision: Staff to assist with self feeding;Full supervision/cueing for compensatory strategies Compensations: Minimize environmental distractions;Slow rate;Small sips/bites Postural Changes: Seated upright at 90 degrees    Other  Recommendations Oral Care Recommendations: Oral care BID     Assistance Recommended at Discharge    Functional Status Assessment Patient has had a recent decline in their functional status and demonstrates the ability to make significant improvements in function in a reasonable and predictable amount of time.  Frequency and Duration min 2x/week  2 weeks       Prognosis        Swallow Study   General HPI: 89 yo male presenting 10/1 with AMS,  fever, N/V, and dysuria. Admitted with severe sepsis from UTI and possible PNA. Pt has been seen by SLP team over the last two years during multiple admissions. He was recommended to have nectar thick liquids in 2023, with which he showed improved coordination and safety compared to with thin liquids, which were aspirated. A breath hold before the  swallow with thin liquids improved airway protection but pt could not consistently implement it. Repeat MBS in May 2025 was [facility] and pt has been back on thin liquids since that time. PMH includes: CVA, GERD, CAD, HLD, low back pain, depression with anxiety, essential HTN Type of Study: Bedside Swallow Evaluation Previous Swallow Assessment: see HPI Diet Prior to this Study: Regular;Thin liquids (Level 0) Temperature Spikes Noted: No Respiratory Status: Nasal cannula History of Recent Intubation: No Behavior/Cognition: Alert;Cooperative;Pleasant mood;Requires cueing Oral Cavity Assessment: Within Functional Limits Oral Care Completed by SLP: No Oral Cavity - Dentition: Adequate natural dentition Vision: Functional for self-feeding Self-Feeding Abilities: Total assist Patient Positioning: Upright in bed Baseline Vocal Quality: Normal Volitional Cough: Weak Volitional Swallow: Able to elicit    Oral/Motor/Sensory Function Overall Oral Motor/Sensory Function: Within functional limits   Ice Chips Ice chips: Not tested   Thin Liquid Thin Liquid: Within functional limits Presentation: Straw    Nectar Thick Nectar Thick Liquid: Not tested   Honey Thick Honey Thick Liquid: Not tested   Puree Puree: Impaired Presentation: Spoon Oral Phase Impairments: Poor awareness of bolus   Solid     Solid: Impaired Oral Phase Impairments: Poor awareness of bolus      Leita SAILOR., M.A. CCC-SLP Acute Rehabilitation Services Office: [phone]  Secure chat preferred  05/21/2024,3:38 PM

## 2024-05-21 NOTE — Progress Notes (Signed)
 Patient arrived to room. Alert and oriented. VSS. Call bell within reach and bed alarm on.

## 2024-05-21 NOTE — Progress Notes (Incomplete)
 Initial Nutrition Assessment  DOCUMENTATION CODES:   Severe malnutrition in context of chronic illness  INTERVENTION:  Encourgae PO intake  Liberalize diet to promote po intake Boost Plus BID, each supplement provides 360 kcal and 14 gm protein *Prefers chocolate  Magic cup TID with meals, each supplement provides 290 kcal and 9 grams of protein *Prefers vanilla  MVI with minerals daily  100 mg Thiamine  dialy  High calorie, high protein handout in AVS May need cortrak If pt becomes more confused and intake decreases   *Has shellfish allergy   NUTRITION DIAGNOSIS:   Severe Malnutrition related to chronic illness as evidenced by severe muscle depletion, severe fat depletion, energy intake < or equal to 75% for > or equal to 1 month, meal completion < 25%, percent weight loss (Pt has had a 10 lb wt loss, 7%, in 6 months.).  GOAL:   Patient will meet greater than or equal to 90% of their needs  MONITOR:   PO intake, Supplement acceptance, Labs  REASON FOR ASSESSMENT:   Consult Assessment of nutrition requirement/status  ASSESSMENT:  89 y.o. male with PMH  of prior CVA (2023), CAD, HLD, CHF,   low back pain, GERD, depression, anxiety, HTN, presented with AMS, nausea and vomiting, Dysuria. Admitted for severe sepsis most likely due to UTI.  10/2 - SLP eval->regular diet, thin liquids, recommended MBS but family refused   Pt more awake today however still slightly confused/dazed. Daughter at bedside provided history. Pt was living at home with his wife and had 24/7 caregivers. Has had multiple aspiration events this year, most recently 6/11-6/17. Pt was discharged on a dysphagia 3 diet. Daughter reports he has been eating a regular diet at home. Daughter has noticed pt losing weight and having decreaed appetitie in the past couple of weeks. She states pt's wife passed away 1-2 weeks ago and suspects pt's poor intake recently is due to this exacerbated by acute sepsis/UTI.   Pt was  provided 3 meals per day with snacks at home, daughter has noticed pt recently only eating bites-25% of his meals/snacks. Was drinking 1 Boost shake per day. Since admission pt has only been eating bites of food. Had bites of mashed potatoes with gravy, pot roast, fruit, yogurt yesterday. Did not have breakfast yet today.   Pt has had a 10 lb wt loss, 7%, in 6 months. On exam pt with severe muscle and fat deficits indicative of malnutrition. Pt was using a walker PTA but is now too weak to stand by himself. Will try to optimize intake with ONS, however if pt becomes more confused, may need cortrak. Encouraged family to bring in food for pt. Currently on Mirtazapine .   Admit weight: 61.2 kg  Current weight: 61.2 kg   66.2 kg, 145 lbs in April 2025, 10 lb wt loss, 7% wt loss in 6 months.  Average Meal Intake: No meals recorded   Nutritionally Relevant Medications: Scheduled Meds:  [START ON 05/23/2024] lactose free nutrition  237 mL Oral BID BM   multivitamin with minerals  1 tablet Oral Daily   thiamine  100 mg Oral Daily   Continuous Infusions:  ampicillin -sulbactam (UNASYN ) IV 3 g (05/22/24 1327)   PRN Meds:.ondansetron  **OR** ondansetron  (ZOFRAN ) IV  Labs Reviewed: Sodium 134 AST 10 CBG ranges from 100-146 mg/dL over the last 24 hours HgbA1c 5.6   NUTRITION - FOCUSED PHYSICAL EXAM:  Flowsheet Row Most Recent Value  Orbital Region Severe depletion  Upper Arm Region Moderate depletion  Thoracic and Lumbar Region Moderate depletion  Buccal Region Severe depletion  Temple Region Severe depletion  Clavicle Bone Region Severe depletion  Clavicle and Acromion Bone Region Severe depletion  Scapular Bone Region Severe depletion  Dorsal Hand Severe depletion  Patellar Region Severe depletion  Anterior Thigh Region Severe depletion  Posterior Calf Region Severe depletion  Edema (RD Assessment) None  Hair Reviewed  Eyes Reviewed  Mouth Reviewed  Skin Reviewed  Nails Reviewed     Diet Order:   Diet Order             Diet regular Room service appropriate? Yes with Assist; Fluid consistency: Thin  Diet effective now                   EDUCATION NEEDS:   Education needs have been addressed  Skin:  Skin Assessment: Reviewed RN Assessment  Last BM:  10/2, Type 1  Height:   Ht Readings from Last 1 Encounters:  05/20/24 5' 7 (1.702 m)    Weight:   Wt Readings from Last 1 Encounters:  05/20/24 61.2 kg    Ideal Body Weight:  67.3 kg  BMI:  Body mass index is 21.13 kg/m.  Estimated Nutritional Needs:   Kcal:  1500-1800 kcal  Protein:  80-100 gm  Fluid:  >1.5L/day   Olivia Kenning, RD Registered Dietitian  See Amion for more information

## 2024-05-21 NOTE — ED Notes (Addendum)
 Daughter feeding pt breakfast

## 2024-05-21 NOTE — Progress Notes (Signed)
 [facility]                                                                               Clarence Myers, is a 89 y.o. male, DOB - [birth year], FMW:[id number] Admit date - 05/20/2024    Outpatient Primary MD for the patient is Loreli Kins, MD  LOS - 1  days    Brief summary   Clarence Myers is a 89 y.o. male with medical history significant of history of prior CVA, coronary artery disease, hyperlipidemia, low back pain, GERD, depression with anxiety, essential hypertension who was brought in by family secondary to confusion, fever, nausea with vomiting as well as dysuria over the last few days    Assessment & Plan    Assessment and Plan:   Sepsis sec to UTI and pneumonia Sepsis physiology slowly improving.  Follow up blood cultures.  Continue with IV fluids.  Continue with IV Antibiotics.  SLP eval for evaluation of aspiration pneumonia.  [place] oxygen  to keep sats greater than 90%. Improving leukocytosis.     AKI From sepsis and hypotension.  Resolved.    Hyperlipidemia Resume statin.     H/o CVA  Therapy evaluations ordered.    GERD  Stable.    Hypertension:  BP parameters are optimal today.  Added midodrine to improve BP .    Anxiety Resume home meds.       Estimated body mass index is 21.13 kg/m as calculated from the following:   Height as of this encounter: 5' 7 (1.702 m).   Weight as of this encounter: 61.2 kg.  Code Status: full code.  DVT Prophylaxis:  enoxaparin  (LOVENOX ) injection 40 mg Start: 05/21/24 1000   Level of Care: Level of care: Telemetry Medical Family Communication: family at bedside.   Disposition Plan:     Remains inpatient appropriate: pending.   Procedures:  None.   Consultants:  none.   Antimicrobials:   Anti-infectives (From admission, onward)    Start     Dose/Rate Route Frequency Ordered Stop   05/21/24 1200  ceFEPIme  (MAXIPIME ) 2 g in sodium chloride  0.9 % 100 mL IVPB        2  g 200 mL/hr over 30 Minutes Intravenous Every 12 hours 05/21/24 0725 05/28/24 1159   05/20/24 2330  ceFEPIme  (MAXIPIME ) 2 g in sodium chloride  0.9 % 100 mL IVPB  Status:  Discontinued        2 g 200 mL/hr over 30 Minutes Intravenous  Once 05/20/24 2323 05/20/24 2325   05/20/24 2330  ceFEPIme  (MAXIPIME ) 2 g in sodium chloride  0.9 % 100 mL IVPB  Status:  Discontinued        2 g 200 mL/hr over 30 Minutes Intravenous Every 12 hours 05/20/24 2325 05/20/24 2326   05/20/24 2330  ceFEPIme  (MAXIPIME ) 2 g in sodium chloride  0.9 % 100 mL IVPB  Status:  Discontinued        2 g 200 mL/hr over 30 Minutes Intravenous Every 24 hours 05/20/24 2326 05/21/24 0725   05/20/24 1915  azithromycin  (ZITHROMAX ) 500 mg in sodium chloride  0.9 % 250 mL IVPB        500 mg 250  mL/hr over 60 Minutes Intravenous  Once 05/20/24 1900 05/21/24 0001   05/20/24 1845  cefTRIAXone  (ROCEPHIN ) 2 g in sodium chloride  0.9 % 100 mL IVPB        2 g 200 mL/hr over 30 Minutes Intravenous  Once 05/20/24 1843 05/20/24 2131        Medications  Scheduled Meds:  enoxaparin  (LOVENOX ) injection  40 mg Subcutaneous Daily   metoprolol  succinate  12.5 mg Oral Daily   midodrine  5 mg Oral TID WC   mirtazapine   15 mg Oral QHS   pantoprazole   40 mg Oral Daily   sertraline   50 mg Oral QHS   Continuous Infusions:  ceFEPime  (MAXIPIME ) IV 2 g (05/21/24 1252)   lactated ringers  75 mL/hr at 05/21/24 1252   PRN Meds:.chlorproMAZINE, ondansetron  **OR** ondansetron  (ZOFRAN ) IV    Subjective:   Clarence Myers was seen and examined today.  Reports feeling tired. No chest pain or sob.   Objective:   Vitals:   05/21/24 0700 05/21/24 0724 05/21/24 0811 05/21/24 0910  BP: (!) 108/56  (!) 109/58 (!) 95/47  Pulse: (!) 59  88 81  Resp: (!) 22  (!) 25 20  Temp:      TempSrc:      SpO2: 95% 94% 97% 96%  Weight:      Height:       No intake or output data in the 24 hours ending 05/21/24 1446 Filed Weights   05/20/24 1715  Weight: 61.2  kg     Exam General exam: ill appearing elderly gentleman, not in distress  Respiratory system: Clear to auscultation. Respiratory effort normal. Cardiovascular system: S1 & S2 heard, RRR. No JVD,  Gastrointestinal system: Abdomen is nondistended, soft and nontender.  Central nervous system: Alert and oriented to person only.  Extremities: Symmetric 5 x 5 power. Skin: No rashes,  Psychiatry: mood is appropriate.    Data Reviewed:  I have personally reviewed following labs and imaging studies   CBC Lab Results  Component Value Date   WBC 18.3 (H) 05/21/2024   RBC 2.68 (L) 05/21/2024   HGB 8.1 (L) 05/21/2024   HCT 25.5 (L) 05/21/2024   MCV 95.1 05/21/2024   MCH 30.2 05/21/2024   PLT 138 (L) 05/21/2024   MCHC 31.8 05/21/2024   RDW 15.4 05/21/2024   LYMPHSABS 1.7 01/29/2024   MONOABS 1.2 (H) 01/29/2024   EOSABS 0.0 01/29/2024   BASOSABS 0.0 01/29/2024     Last metabolic panel Lab Results  Component Value Date   NA 134 (L) 05/21/2024   K 3.7 05/21/2024   CL 108 05/21/2024   CO2 16 (L) 05/21/2024   BUN 22 05/21/2024   CREATININE 0.79 05/21/2024   GLUCOSE 100 (H) 05/21/2024   GFRNONAA >60 05/21/2024   GFRAA  09/02/2007    >60        The eGFR has been calculated using the MDRD equation. This calculation has not been validated in all clinical situations. eGFR's persistently <60 mL/min signify possible Chronic Kidney Disease.   CALCIUM  7.2 (L) 05/21/2024   PROT 4.1 (L) 05/21/2024   ALBUMIN 1.6 (L) 05/21/2024   BILITOT 0.5 05/21/2024   ALKPHOS 38 05/21/2024   AST 10 (L) 05/21/2024   ALT 7 05/21/2024   ANIONGAP 10 05/21/2024    CBG (last 3)  No results for input(s): GLUCAP in the last 72 hours.    Coagulation Profile: Recent Labs  Lab 05/20/24 1943 05/21/24 0903  INR 1.2 1.9*  Radiology Studies: DG Chest Portable 1 View Result Date: 05/20/2024 CLINICAL DATA:  confusion  4 days elevated temp today blood urine EXAM: PORTABLE CHEST - 1 VIEW  COMPARISON:  05/02/2024 FINDINGS: Streaky opacities in the right lung base. No pleural effusion or pneumothorax. No cardiomegaly. Aortic atherosclerosis. No acute fracture or destructive lesions. Multilevel thoracic osteophytosis. IMPRESSION: Streaky opacities in the right lung base, likely atelectasis. Alternatively, this could reflect a developing bronchopneumonia, in the correct clinical context. Electronically Signed   By: Rogelia Myers M.D.   On: 05/20/2024 18:49       Elgie Butter M.D. [facility] 05/21/2024, 2:46 PM  Available via Epic secure chat 7am-7pm After 7 pm, please refer to night coverage provider listed on amion.

## 2024-05-21 NOTE — ED Notes (Signed)
 PA made aware that first bag of fluids are  completed, but Bp is still soft- 94/44. Pt is alert and has been responding well to me.

## 2024-05-21 NOTE — ED Notes (Signed)
Pt placed on 2L NC while asleep.

## 2024-05-21 NOTE — Progress Notes (Signed)
 Gave patient a drink of water and he choked on it. I notified Dr. Cherlyn that patient needs a SLP.

## 2024-05-21 NOTE — Plan of Care (Signed)

## 2024-05-22 ENCOUNTER — Inpatient Hospital Stay (HOSPITAL_COMMUNITY)

## 2024-05-22 DIAGNOSIS — R4182 Altered mental status, unspecified: Secondary | ICD-10-CM | POA: Diagnosis not present

## 2024-05-22 DIAGNOSIS — N3000 Acute cystitis without hematuria: Secondary | ICD-10-CM | POA: Diagnosis not present

## 2024-05-22 DIAGNOSIS — R9089 Other abnormal findings on diagnostic imaging of central nervous system: Secondary | ICD-10-CM | POA: Diagnosis not present

## 2024-05-22 DIAGNOSIS — I10 Essential (primary) hypertension: Secondary | ICD-10-CM | POA: Diagnosis not present

## 2024-05-22 DIAGNOSIS — K219 Gastro-esophageal reflux disease without esophagitis: Secondary | ICD-10-CM | POA: Diagnosis not present

## 2024-05-22 DIAGNOSIS — N179 Acute kidney failure, unspecified: Secondary | ICD-10-CM | POA: Diagnosis not present

## 2024-05-22 DIAGNOSIS — E43 Unspecified severe protein-calorie malnutrition: Secondary | ICD-10-CM | POA: Insufficient documentation

## 2024-05-22 DIAGNOSIS — Z8673 Personal history of transient ischemic attack (TIA), and cerebral infarction without residual deficits: Secondary | ICD-10-CM | POA: Diagnosis not present

## 2024-05-22 LAB — BLOOD CULTURE ID PANEL (REFLEXED) - BCID2

## 2024-05-22 LAB — CBC
HCT: 33 % — ABNORMAL LOW (ref 39.0–52.0)
Hemoglobin: 10.6 g/dL — ABNORMAL LOW (ref 13.0–17.0)
MCH: 29.8 pg (ref 26.0–34.0)
MCHC: 32.1 g/dL (ref 30.0–36.0)
MCV: 92.7 fL (ref 80.0–100.0)
Platelets: 187 K/uL (ref 150–400)
RBC: 3.56 MIL/uL — ABNORMAL LOW (ref 4.22–5.81)
RDW: 15.4 % (ref 11.5–15.5)
WBC: 17.7 K/uL — ABNORMAL HIGH (ref 4.0–10.5)
nRBC: 0 % (ref 0.0–0.2)

## 2024-05-22 MED ORDER — BOOST PLUS PO LIQD
237.0000 mL | Freq: Two times a day (BID) | ORAL | Status: DC
  Administered 2024-05-22 – 2024-05-28 (×10): 237 mL via ORAL
  Filled 2024-05-22 (×13): qty 237

## 2024-05-22 MED ORDER — ADULT MULTIVITAMIN W/MINERALS CH
1.0000 | ORAL_TABLET | Freq: Every day | ORAL | Status: DC
  Administered 2024-05-22 – 2024-05-23 (×2): 1 via ORAL
  Filled 2024-05-22 (×3): qty 1

## 2024-05-22 MED ORDER — CHLORPROMAZINE HCL 25 MG PO TABS: 50.0000 mg | ORAL_TABLET | Freq: Three times a day (TID) | ORAL | Status: DC | PRN

## 2024-05-22 MED ORDER — METOCLOPRAMIDE HCL 5 MG/ML IJ SOLN
5.0000 mg | Freq: Once | INTRAMUSCULAR | Status: AC
  Administered 2024-05-22: 5 mg via INTRAVENOUS
  Filled 2024-05-22: qty 2

## 2024-05-22 MED ORDER — THIAMINE MONONITRATE 100 MG PO TABS
100.0000 mg | ORAL_TABLET | Freq: Every day | ORAL | Status: DC
  Administered 2024-05-22 – 2024-05-28 (×7): 100 mg via ORAL
  Filled 2024-05-22 (×7): qty 1

## 2024-05-22 MED ORDER — SENNOSIDES-DOCUSATE SODIUM 8.6-50 MG PO TABS
2.0000 | ORAL_TABLET | Freq: Two times a day (BID) | ORAL | Status: DC
  Administered 2024-05-22 – 2024-05-28 (×12): 2 via ORAL
  Filled 2024-05-22 (×12): qty 2

## 2024-05-22 MED ORDER — BOOST PLUS PO LIQD: 237.0000 mL | Freq: Two times a day (BID) | ORAL | Status: DC

## 2024-05-22 MED ORDER — CYCLOSPORINE 0.05 % OP EMUL
1.0000 [drp] | Freq: Two times a day (BID) | OPHTHALMIC | Status: DC
  Administered 2024-05-22 – 2024-05-28 (×12): 1 [drp] via OPHTHALMIC
  Filled 2024-05-22 (×13): qty 30

## 2024-05-22 MED ORDER — BOOST PLUS PO LIQD
237.0000 mL | Freq: Three times a day (TID) | ORAL | Status: DC
  Administered 2024-05-22: 237 mL via ORAL
  Filled 2024-05-22 (×2): qty 237

## 2024-05-22 MED ORDER — CLOPIDOGREL BISULFATE 75 MG PO TABS
75.0000 mg | ORAL_TABLET | Freq: Every day | ORAL | Status: DC
  Administered 2024-05-22 – 2024-05-28 (×7): 75 mg via ORAL
  Filled 2024-05-22 (×7): qty 1

## 2024-05-22 MED ORDER — LATANOPROST 0.005 % OP SOLN
1.0000 [drp] | Freq: Every day | OPHTHALMIC | Status: DC
  Administered 2024-05-22 – 2024-05-27 (×6): 1 [drp] via OPHTHALMIC
  Filled 2024-05-22: qty 2.5

## 2024-05-22 MED ORDER — POLYETHYLENE GLYCOL 3350 17 G PO PACK
17.0000 g | PACK | Freq: Every day | ORAL | Status: DC
  Administered 2024-05-22 – 2024-05-28 (×7): 17 g via ORAL
  Filled 2024-05-22 (×7): qty 1

## 2024-05-22 MED ORDER — SODIUM CHLORIDE 0.9 % IV SOLN
3.0000 g | Freq: Four times a day (QID) | INTRAVENOUS | Status: DC
  Administered 2024-05-22 – 2024-05-27 (×21): 3 g via INTRAVENOUS
  Filled 2024-05-22 (×21): qty 8

## 2024-05-22 NOTE — Progress Notes (Signed)
 PHARMACY - PHYSICIAN COMMUNICATION CRITICAL VALUE ALERT - BLOOD CULTURE IDENTIFICATION (BCID)  Clarence Myers is an 89 y.o. male who presented to [facility] on 05/20/2024 with a chief complaint of UTI/PNA  Assessment:   1/2 blood cultures growing Staph epidermidis, likely contaminant  Name of physician (or Provider) Contacted:  Dr. Franky  Current antibiotics:  Cefepime   Changes to prescribed antibiotics recommended:  No changes needed at this time  Results for orders placed or performed during the hospital encounter of 05/20/24  Blood Culture ID Panel (Reflexed) (Collected: 05/20/2024  7:43 PM)  Result Value Ref Range   Enterococcus faecalis NOT DETECTED NOT DETECTED   Enterococcus Faecium NOT DETECTED NOT DETECTED   Listeria monocytogenes NOT DETECTED NOT DETECTED   Staphylococcus species DETECTED (A) NOT DETECTED   Staphylococcus aureus (BCID) NOT DETECTED NOT DETECTED   Staphylococcus epidermidis DETECTED (A) NOT DETECTED   Staphylococcus lugdunensis NOT DETECTED NOT DETECTED   Streptococcus species NOT DETECTED NOT DETECTED   Streptococcus agalactiae NOT DETECTED NOT DETECTED   Streptococcus pneumoniae NOT DETECTED NOT DETECTED   Streptococcus pyogenes NOT DETECTED NOT DETECTED   A.calcoaceticus-baumannii NOT DETECTED NOT DETECTED   Bacteroides fragilis NOT DETECTED NOT DETECTED   Enterobacterales NOT DETECTED NOT DETECTED   Enterobacter cloacae complex NOT DETECTED NOT DETECTED   Escherichia coli NOT DETECTED NOT DETECTED   Klebsiella aerogenes NOT DETECTED NOT DETECTED   Klebsiella oxytoca NOT DETECTED NOT DETECTED   Klebsiella pneumoniae NOT DETECTED NOT DETECTED   Proteus species NOT DETECTED NOT DETECTED   Salmonella species NOT DETECTED NOT DETECTED   Serratia marcescens NOT DETECTED NOT DETECTED   Haemophilus influenzae NOT DETECTED NOT DETECTED   Neisseria meningitidis NOT DETECTED NOT DETECTED   Pseudomonas aeruginosa NOT DETECTED NOT DETECTED    Stenotrophomonas maltophilia NOT DETECTED NOT DETECTED   Candida albicans NOT DETECTED NOT DETECTED   Candida auris NOT DETECTED NOT DETECTED   Candida glabrata NOT DETECTED NOT DETECTED   Candida krusei NOT DETECTED NOT DETECTED   Candida parapsilosis NOT DETECTED NOT DETECTED   Candida tropicalis NOT DETECTED NOT DETECTED   Cryptococcus neoformans/gattii NOT DETECTED NOT DETECTED   Methicillin resistance mecA/C DETECTED (A) NOT DETECTED    Clarence Myers 05/22/2024  1:17 AM

## 2024-05-22 NOTE — Progress Notes (Signed)
 [facility]                                                                               Cord Wilczynski, is a 89 y.o. male, DOB - 1934-04-07, FMW:[id number] Admit date - 05/20/2024    Outpatient Primary MD for the patient is Loreli Kins, MD  LOS - 2  days    Brief summary   Clarence Myers is a 89 y.o. male with medical history significant of history of prior CVA, coronary artery disease, hyperlipidemia, low back pain, GERD, depression with anxiety, essential hypertension who was brought in by family secondary to confusion, fever, nausea with vomiting as well as dysuria over the last few days    Assessment & Plan    Assessment and Plan:   Sepsis sec to UTI and pneumonia Sepsis physiology slowly improving.  Follow up blood cultures. Blood culture shows staph epidermidis, suspect contamination. SLP eval for evaluation of aspiration pneumonia.  [place] oxygen  to keep sats greater than 90%. Improving leukocytosis.     Acute metabolic encephalopathy:  Suspect from ?  UTI  and aspiration pneumonia. Worsened by IV cefepime . D/c IV CEFEPIME  and change to IV unasyn .  Unfortunately urine cultures were not obtained on admission. Patient has new swallowing issues.  Recommend checking MRI brain without contrast to evaluate for stroke.    Dysphagia  SLP eval ordered.  Patient having choking episodes with solids.  Family reports patient having these choking episodes since 2 1/2 years.   AKI From sepsis and hypotension.  Resolved.    Hyperlipidemia Resume statin.     H/o CVA  Therapy evaluations ordered.  Plan for SNF on discharge.    GERD  Stable.    Hypertension:  BP parameters are optimal.  Continue with midodrine for now.    Anxiety Resume home meds.       Estimated body mass index is 21.13 kg/m as calculated from the following:   Height as of this encounter: 5' 7 (1.702 m).   Weight as of this encounter: 61.2 kg.  Code Status: full  code.  DVT Prophylaxis:  enoxaparin  (LOVENOX ) injection 40 mg Start: 05/21/24 1000   Level of Care: Level of care: Telemetry Medical Family Communication: family at bedside.   Disposition Plan:     Remains inpatient appropriate: pending.   Procedures:  None.   Consultants:  none.   Antimicrobials:   Anti-infectives (From admission, onward)    Start     Dose/Rate Route Frequency Ordered Stop   05/22/24 1200  Ampicillin -Sulbactam (UNASYN ) 3 g in sodium chloride  0.9 % 100 mL IVPB        3 g 200 mL/hr over 30 Minutes Intravenous Every 6 hours 05/22/24 1056     05/21/24 1200  ceFEPIme  (MAXIPIME ) 2 g in sodium chloride  0.9 % 100 mL IVPB  Status:  Discontinued        2 g 200 mL/hr over 30 Minutes Intravenous Every 12 hours 05/21/24 0725 05/22/24 1056   05/20/24 2330  ceFEPIme  (MAXIPIME ) 2 g in sodium chloride  0.9 % 100 mL IVPB  Status:  Discontinued        2 g 200 mL/hr over  30 Minutes Intravenous  Once 05/20/24 2323 05/20/24 2325   05/20/24 2330  ceFEPIme  (MAXIPIME ) 2 g in sodium chloride  0.9 % 100 mL IVPB  Status:  Discontinued        2 g 200 mL/hr over 30 Minutes Intravenous Every 12 hours 05/20/24 2325 05/20/24 2326   05/20/24 2330  ceFEPIme  (MAXIPIME ) 2 g in sodium chloride  0.9 % 100 mL IVPB  Status:  Discontinued        2 g 200 mL/hr over 30 Minutes Intravenous Every 24 hours 05/20/24 2326 05/21/24 0725   05/20/24 1915  azithromycin  (ZITHROMAX ) 500 mg in sodium chloride  0.9 % 250 mL IVPB        500 mg 250 mL/hr over 60 Minutes Intravenous  Once 05/20/24 1900 05/21/24 0001   05/20/24 1845  cefTRIAXone  (ROCEPHIN ) 2 g in sodium chloride  0.9 % 100 mL IVPB        2 g 200 mL/hr over 30 Minutes Intravenous  Once 05/20/24 1843 05/20/24 2131        Medications  Scheduled Meds:  clopidogrel   75 mg Oral Daily   cycloSPORINE   1 drop Both Eyes BID   enoxaparin  (LOVENOX ) injection  40 mg Subcutaneous Daily   lactose free nutrition  237 mL Oral BID BM   latanoprost   1 drop Both  Eyes QHS   metoprolol  succinate  12.5 mg Oral Daily   midodrine  5 mg Oral TID WC   multivitamin with minerals  1 tablet Oral Daily   pantoprazole   40 mg Oral Daily   polyethylene glycol  17 g Oral Daily   senna-docusate  2 tablet Oral BID   sertraline   50 mg Oral QHS   thiamine  100 mg Oral Daily   Continuous Infusions:  ampicillin -sulbactam (UNASYN ) IV 3 g (05/22/24 1327)   PRN Meds:.ondansetron  **OR** ondansetron  (ZOFRAN ) IV    Subjective:   Clarence Myers was seen and examined today.  Discontinued the mirtazapine  as per family request.    Objective:   Vitals:   05/21/24 2003 05/22/24 0427 05/22/24 0835 05/22/24 1522  BP: (!) 117/53 (!) 134/48 (!) 131/46 104/61  Pulse: 72 68 (!) 106 74  Resp: 18 19 17 15   Temp: 99.4 F (37.4 C) 98.5 F (36.9 C) 98.9 F (37.2 C) 100.1 F (37.8 C)  TempSrc: Oral Oral Oral Oral  SpO2: 98% 95% 97% 94%  Weight:      Height:        Intake/Output Summary (Last 24 hours) at 05/22/2024 1835 Last data filed at 05/22/2024 1100 Gross per 24 hour  Intake 896.07 ml  Output 1701 ml  Net -804.93 ml   Filed Weights   05/20/24 1715  Weight: 61.2 kg     Exam General exam: ill appearing elderly gentleman, lethargic, not in distress Respiratory system: Clear to auscultation. Respiratory effort normal. Cardiovascular system: S1 & S2 heard, RRR. No JVD Gastrointestinal system: Abdomen is soft bs+ Central nervous system: lethargic, answering questions with eyes closed. Having hiccups.  Extremities: no edema.  Skin: No rashes,  Psychiatry: unable to assess due to lethargy.    Data Reviewed:  I have personally reviewed following labs and imaging studies   CBC Lab Results  Component Value Date   WBC 17.7 (H) 05/22/2024   RBC 3.56 (L) 05/22/2024   HGB 10.6 (L) 05/22/2024   HCT 33.0 (L) 05/22/2024   MCV 92.7 05/22/2024   MCH 29.8 05/22/2024   PLT 187 05/22/2024   MCHC 32.1 05/22/2024  RDW 15.4 05/22/2024   LYMPHSABS 1.7 01/29/2024    MONOABS 1.2 (H) 01/29/2024   EOSABS 0.0 01/29/2024   BASOSABS 0.0 01/29/2024     Last metabolic panel Lab Results  Component Value Date   NA 134 (L) 05/21/2024   K 3.7 05/21/2024   CL 108 05/21/2024   CO2 16 (L) 05/21/2024   BUN 22 05/21/2024   CREATININE 0.79 05/21/2024   GLUCOSE 100 (H) 05/21/2024   GFRNONAA >60 05/21/2024   GFRAA  09/02/2007    >60        The eGFR has been calculated using the MDRD equation. This calculation has not been validated in all clinical situations. eGFR's persistently <60 mL/min signify possible Chronic Kidney Disease.   CALCIUM  7.2 (L) 05/21/2024   PROT 4.1 (L) 05/21/2024   ALBUMIN 1.6 (L) 05/21/2024   BILITOT 0.5 05/21/2024   ALKPHOS 38 05/21/2024   AST 10 (L) 05/21/2024   ALT 7 05/21/2024   ANIONGAP 10 05/21/2024    CBG (last 3)  No results for input(s): GLUCAP in the last 72 hours.    Coagulation Profile: Recent Labs  Lab 05/20/24 1943 05/21/24 0903  INR 1.2 1.9*     Radiology Studies: DG CHEST PORT 1 VIEW Result Date: 05/22/2024 CLINICAL DATA:  [id number]. Recent fever, UTI, questionable pneumonia. Follow-up exam. EXAM: PORTABLE CHEST 1 VIEW COMPARISON:  Portable chest 05/20/2024 at 6:22 p.m. FINDINGS: 5:37 a.m. There is again noted subpleural reticulation of the lung fields with a basal gradient, chronic. Stable mild cardiomegaly. Central vascular fullness is noted without edema. Mild chronic elevation right hemidiaphragm. There is increased opacity in the right base along the elevated hemidiaphragm which could be atelectasis, pneumonia or aspiration. Rest of the lungs are generally clear with a few foci of linear scarring. The mediastinum is normally outlined. No new osseous finding. Overall aeration seems unchanged. IMPRESSION: 1. Increased opacity in the right base along the elevated hemidiaphragm which could be atelectasis, pneumonia or aspiration. 2. Stable mild cardiomegaly and central vascular fullness without edema. 3.  Chronic subpleural reticulation of the lung fields with a basal gradient. Electronically Signed   By: Francis Quam M.D.   On: 05/22/2024 06:11       Elgie Butter M.D. [facility] 05/22/2024, 6:35 PM  Available via Epic secure chat 7am-7pm After 7 pm, please refer to night coverage provider listed on amion.

## 2024-05-22 NOTE — TOC Initial Note (Signed)
 Transition of Care [facility]) - Initial/Assessment Note    Patient Details  Name: Clarence Myers MRN: [id number] Date of Birth: 02-09-1934  Transition of [facility]) CM/SW Contact:    Clarence Myers, Clarence Myers Phone Number: 05/22/2024, 3:16 PM  Clinical Narrative:                 Pt admitted from home due to confusion, fever, n/v and dysuria. Pt has caregiver 24/7 at home. CSW completed SNF workup with pt daughter, Clarence Myers, over the phone, Clarence Myers is agreeable to SNF when pt is medically ready to be discharged and stated her top 2 facility choices were [name] and [facility]. CSW completed Fl2 and sent out SNF referrals. CSW will follow up with bed offers.   Expected Discharge Plan: Skilled Nursing Facility Barriers to Discharge: English as a second language teacher, Continued Medical Work up, SNF Pending bed offer   Patient Goals and CMS Choice Patient states their goals for this hospitalization and ongoing recovery are:: SNF          Expected Discharge Plan and Services       Living arrangements for the past 2 months: Single Family Home                                      Prior Living Arrangements/Services Living arrangements for the past 2 months: Single Family Home Lives with:: Self Patient language and need for interpreter reviewed:: Yes Do you feel safe going back to the place where you live?: Yes      Need for Family Participation in Patient Care: Yes (Comment) Care giver support system in place?: Yes (comment)   Criminal Activity/Legal Involvement Pertinent to Current Situation/Hospitalization: No - Comment as needed  Activities of Daily Living   ADL Screening (condition at time of admission) Independently performs ADLs?: No Does the patient have a NEW difficulty with bathing/dressing/toileting/self-feeding that is expected to last >3 days?: No Does the patient have a NEW difficulty with getting in/out of bed, walking, or climbing stairs that is expected to last >3 days?:  No Does the patient have a NEW difficulty with communication that is expected to last >3 days?: No Is the patient deaf or have difficulty hearing?: No Does the patient have difficulty seeing, even when wearing glasses/contacts?: No Does the patient have difficulty concentrating, remembering, or making decisions?: Yes  Permission Sought/Granted Permission sought to share information with : Facility Medical sales representative, Family Supports    Share Information with NAME: Clarence Myers  Permission granted to share info w AGENCY: SNFs  Permission granted to share info w Relationship: Daughter  Permission granted to share info w Contact Information: [phone]  Emotional Assessment Appearance:: Appears stated age Attitude/Demeanor/Rapport: Unable to Assess Affect (typically observed): Unable to Assess Orientation: : Oriented to Self, Oriented to Place Alcohol / Substance Use: Not Applicable Psych Involvement: No (comment)  Admission diagnosis:  Confusion [R41.0] UTI (urinary tract infection) [N39.0] Acute cystitis with hematuria [N30.01] Fever, unspecified fever cause [R50.9] Patient Active Problem List   Diagnosis Date Noted   Protein-calorie malnutrition, severe 05/22/2024   UTI (urinary tract infection) 05/20/2024   AKI (acute kidney injury) 05/20/2024   Aspiration pneumonitis (HCC) 01/30/2024   Acute hypoxic respiratory failure (HCC) 01/10/2024   History of CVA x 2 in the past (cerebrovascular accident) 10/07/2023   CKD (chronic kidney disease), stage II 10/07/2023   GERD (gastroesophageal reflux disease) 10/07/2023   Chronic diastolic  CHF (congestive heart failure) (HCC) 10/07/2023   GAD (generalized anxiety disorder) 10/07/2023   Slurred speech    Essential hypertension 09/29/2021   CAD (coronary artery disease)    Hyperlipidemia    PCP:  Clarence Kins, MD Pharmacy:   [facility] - [place], [place] - [address] [place] [address] Phone: [phone] Fax: [phone]  Clarence Myers - [facility] [address] [place] [address] Phone: [phone] Fax: [phone]     Social Drivers of Health (SDOH) Social History: SDOH Screenings   Food Insecurity: No Food Insecurity (05/21/2024)  Housing: Low Risk  (05/21/2024)  Transportation Needs: No Transportation Needs (05/21/2024)  Utilities: Not At Risk (05/21/2024)  Social Connections: Socially Isolated (05/21/2024)  Tobacco Use: Low Risk  (05/20/2024)   SDOH Interventions:     Readmission Risk Interventions    01/13/2024   11:53 AM  Readmission Risk Prevention Plan  Transportation Screening Complete  PCP or Specialist Appt within 5-7 Days Complete  Home Care Screening Complete  Medication Review (RN CM) Complete

## 2024-05-22 NOTE — Evaluation (Signed)
 Physical Therapy Evaluation Patient Details Name: Clarence Myers MRN: [id number] DOB: 1934/01/24 Today's Date: 05/22/2024  History of Present Illness  Clarence Myers is a 89 y.o. male who presented to [facility] 05/20/24 for confusion, fever, nausea with vomiting as well as dysuria over the last few days. Urinalysis was positive for UTI.  CXR showed streaky opacity in the right lung base likely atelectasis or developing bronchopneumonia. PMHx: CVA with residual right-sided deficits, CAD, HLD, GERD, low back pain, depression with anxiety, and essential HTN.   Clinical Impression  Pt admitted with above diagnosis. PTA, pt required 1+ assist with functional mobility using RW and assistance with ADLs/IADLs. He lives in a two story house with 2 STE and is able to reside on the main level. Family reports 24/7 supervision and assist in place through caregivers and family members. Pt currently with functional limitations due to the deficits listed below (see PT Problem List). He required totalA x2 for bed mobility and maxA x2 for transfers with 2 HHA. Pt is below his baseline function requiring +2 assist. He is currently limited by generalized weakness, impaired balance, and decreased activity tolerance. Pt will benefit from acute skilled PT to maximize his independence and safety with mobility to allow discharge. Recommend continued inpatient follow up therapy, <3 hours/day.    If plan is discharge home, recommend the following: Two people to help with walking and/or transfers;Two people to help with bathing/dressing/bathroom;Assistance with cooking/housework;Assist for transportation;Help with stairs or ramp for entrance   Can travel by private vehicle   No    Equipment Recommendations Hoyer lift  Recommendations for Other Services       Functional Status Assessment Patient has had a recent decline in their functional status and/or demonstrates limited ability to make significant improvements in  function in a reasonable and predictable amount of time     Precautions / Restrictions Precautions Precautions: Fall Recall of Precautions/Restrictions: Intact Restrictions Weight Bearing Restrictions Per Provider Order: No      Mobility  Bed Mobility Overal bed mobility: Needs Assistance Bed Mobility: Supine to Sit     Supine to sit: Total assist, +2 for physical assistance     General bed mobility comments: Sat pt up on L side of bed using helicopter technique to pivot pt with use of bed pads. He was unable to participate in moving either LE or reaching with UEs. Scooted pt to feet flat using bed pad.    Transfers Overall transfer level: Needs assistance Equipment used: 2 person hand held assist Transfers: Sit to/from Stand, Bed to chair/wheelchair/BSC Sit to Stand: +2 physical assistance, Max assist   Step pivot transfers: +2 physical assistance, Max assist       General transfer comment: Pt stood from lowest bed height x3 reps. Assist to rise and steady with bed pad under hips to faciliate hip ext. Pt with posterior bias requiring stabilization. He transferred to recliner chair positioned at a diagonal close to the bed on his left. Controlled lowered by PT/OT. Repositioned using bed pad.    Ambulation/Gait Ambulation/Gait assistance: Max assist, +2 physical assistance Gait Distance (Feet): 2 Feet Assistive device: 2 person hand held assist Gait Pattern/deviations: Step-to pattern       General Gait Details: Pt took short, slow steps to pivot to recliner chair with step-by-step cues and heavy reliance on external support by PT/OT. Therapist faciliated pivot turn at hips using bed pad to guide pt to chair.  Stairs  Wheelchair Mobility     Tilt Bed    Modified Rankin (Stroke Patients Only)       Balance Overall balance assessment: Needs assistance Sitting-balance support: Feet supported Sitting balance-Leahy Scale: Zero Sitting balance -  Comments: Pt required mod-maxA to maintain static balance Postural control: Posterior lean, Left lateral lean Standing balance support: Bilateral upper extremity supported, During functional activity Standing balance-Leahy Scale: Zero Standing balance comment: Pt dependent on maxA x2 of therapists.                             Pertinent Vitals/Pain Pain Assessment Pain Assessment: No/denies pain    Home Living Family/patient expects to be discharged to:: Private residence Living Arrangements: Alone;Other (Comment) (has 24 H care) Available Help at Discharge: Family;Personal care attendant;Available 24 hours/day (Caregivers during the day; Daughter stays at night; Son-in-Law prn) Type of Home: House Home Access: Stairs to enter Entrance Stairs-Rails: None Entrance Stairs-Number of Steps: 2   Home Layout: Two level;Able to live on main level with bedroom/bathroom Home Equipment: BSC/3in1;Tub bench;Wheelchair - Forensic psychologist (2 wheels);Lift chair;Hospital bed Additional Comments: family bumps him up the steps in his w/c    Prior Function Prior Level of Function : Needs assist       Physical Assist : ADLs (physical);Mobility (physical) Mobility (physical): Bed mobility;Transfers;Gait;Stairs ADLs (physical): Grooming;Bathing;Dressing;Toileting;IADLs Mobility Comments: Family reports 1+ assist for all mobility using RW. He can ambulate short distances. Family has been bumping him up in his w/c to get in/out of the house. ADLs Comments: Pt can self feed, assisted for all other ADLs and IADLs.     Extremity/Trunk Assessment   Upper Extremity Assessment Upper Extremity Assessment: Defer to OT evaluation RUE Deficits / Details: hx of CVA, weaker than typical RUE Coordination: decreased fine motor;decreased gross motor    Lower Extremity Assessment Lower Extremity Assessment: RLE deficits/detail;LLE deficits/detail RLE Deficits / Details: Hemiplegia secondary to  CVA. RLE Sensation: decreased proprioception RLE Coordination: decreased gross motor LLE Deficits / Details: Decreased strength, grossly 2+/5. LLE Sensation: decreased proprioception LLE Coordination: decreased gross motor    Cervical / Trunk Assessment Cervical / Trunk Assessment: Kyphotic;Other exceptions Cervical / Trunk Exceptions: Pt resting in cervical flexion d/t weakness. Cues to improve head positioning, but quickly fatigues.  Communication   Communication Communication: No apparent difficulties    Cognition Arousal: Lethargic, Alert Behavior During Therapy: Flat affect   PT - Cognitive impairments: Initiation, Sequencing                       PT - Cognition Comments: Pt initially drowsy, alertness improved upon sitting up. He took increased time to follow commands and participate in mobility. Following commands: Impaired Following commands impaired: Follows one step commands with increased time, Follows one step commands inconsistently     Cueing Cueing Techniques: Verbal cues, Tactile cues, Visual cues     General Comments      Exercises     Assessment/Plan    PT Assessment Patient needs continued PT services  PT Problem List Decreased strength;Decreased activity tolerance;Decreased balance;Decreased mobility;Decreased safety awareness       PT Treatment Interventions DME instruction;Gait training;Functional mobility training;Therapeutic activities;Therapeutic exercise;Balance training;Patient/family education;Wheelchair mobility training    PT Goals (Current goals can be found in the Care Plan section)  Acute Rehab PT Goals Patient Stated Goal: Maintain the most mobility I can PT Goal Formulation: With patient/family Time For Goal Achievement: 06/05/24 Potential  to Achieve Goals: Fair    Frequency Min 2X/week     Co-evaluation PT/OT/SLP Co-Evaluation/Treatment: Yes Reason for Co-Treatment: For patient/therapist safety;To address  functional/ADL transfers PT goals addressed during session: Mobility/safety with mobility;Balance OT goals addressed during session: ADL's and self-care       AM-PAC PT 6 Clicks Mobility  Outcome Measure Help needed turning from your back to your side while in a flat bed without using bedrails?: Total Help needed moving from lying on your back to sitting on the side of a flat bed without using bedrails?: Total Help needed moving to and from a bed to a chair (including a wheelchair)?: Total Help needed standing up from a chair using your arms (e.g., wheelchair or bedside chair)?: Total Help needed to walk in hospital room?: Total Help needed climbing 3-5 steps with a railing? : Total 6 Click Score: 6    End of Session   Activity Tolerance: Patient limited by fatigue Patient left: in chair;with call bell/phone within reach;with chair alarm set;with family/visitor present Nurse Communication: Mobility status;Need for lift equipment (stedy vs. maximove) PT Visit Diagnosis: Muscle weakness (generalized) (M62.81);Difficulty in walking, not elsewhere classified (R26.2);Unsteadiness on feet (R26.81)    Time: 9090-9063 PT Time Calculation (min) (ACUTE ONLY): 27 min   Charges:   PT Evaluation $PT Eval Moderate Complexity: 1 Mod   PT General Charges $$ ACUTE PT VISIT: 1 Visit         Randall SAUNDERS, PT, DPT Acute Rehabilitation Services Office: [phone] Secure Chat Preferred  Delon CHRISTELLA Callander 05/22/2024, 10:09 AM

## 2024-05-22 NOTE — Plan of Care (Signed)

## 2024-05-22 NOTE — NC FL2 (Signed)
 [place]  MEDICAID FL2 LEVEL OF CARE FORM     IDENTIFICATION  Patient Name: Clarence Myers Birthdate: November 10, 1933 Sex: male Admission Date (Current Location): 05/20/2024  [facility] and [place] Number:  Producer, television/film/video and Address:  [facility] [place], [address], [place], [place] [address]      Provider Number: [id number]  Attending Physician Name and Address:  Cherlyn Labella, MD  Relative Name and Phone Number:       Current Level of Care: Hospital Recommended Level of Care: Skilled Nursing Facility Prior Approval Number:    Date Approved/Denied:   PASRR Number: [id number]  Discharge Plan: SNF    Current Diagnoses: Patient Active Problem List   Diagnosis Date Noted   Protein-calorie malnutrition, severe 05/22/2024   UTI (urinary tract infection) 05/20/2024   AKI (acute kidney injury) 05/20/2024   Aspiration pneumonitis (HCC) 01/30/2024   Acute hypoxic respiratory failure (HCC) 01/10/2024   History of CVA x 2 in the past (cerebrovascular accident) 10/07/2023   CKD (chronic kidney disease), stage II 10/07/2023   GERD (gastroesophageal reflux disease) 10/07/2023   Chronic diastolic CHF (congestive heart failure) (HCC) 10/07/2023   GAD (generalized anxiety disorder) 10/07/2023   Slurred speech    Essential hypertension 09/29/2021   CAD (coronary artery disease)    Hyperlipidemia     Orientation RESPIRATION BLADDER Height & Weight     Self, Place  O2 Incontinent Weight: 134 lb 14.7 oz (61.2 kg) Height:  5' 7 (170.2 cm)  BEHAVIORAL SYMPTOMS/MOOD NEUROLOGICAL BOWEL NUTRITION STATUS      Incontinent Diet (See DC Summary)  AMBULATORY STATUS COMMUNICATION OF NEEDS Skin   Extensive Assist Verbally Normal                       Personal Care Assistance Level of Assistance  Bathing, Dressing, Feeding Bathing Assistance: Maximum assistance Feeding assistance: Limited assistance Dressing Assistance: Maximum assistance     Functional  Limitations Info  Sight, Hearing, Speech Sight Info: Impaired Hearing Info: Adequate Speech Info: Adequate    SPECIAL CARE FACTORS FREQUENCY  PT (By licensed PT), OT (By licensed OT)     PT Frequency: 5x/week OT Frequency: 5x/week            Contractures Contractures Info: Not present    Additional Factors Info  Code Status, Allergies Code Status Info: Full Allergies Info: Blue Crab (cavinectes sapidus) allergy skin test; Shellfish-derived products; Bee venom           Current Medications (05/22/2024):  This is the current hospital active medication list Current Facility-Administered Medications  Medication Dose Route Frequency Provider Last Rate Last Admin   Ampicillin -Sulbactam (UNASYN ) 3 g in sodium chloride  0.9 % 100 mL IVPB  3 g Intravenous Q6H Cherlyn Labella, MD 200 mL/hr at 05/22/24 1327 3 g at 05/22/24 1327   clopidogrel  (PLAVIX ) tablet 75 mg  75 mg Oral Daily Akula, Vijaya, MD   75 mg at 05/22/24 1311   cycloSPORINE  (RESTASIS ) 0.05 % ophthalmic emulsion 1 drop  1 drop Both Eyes BID Akula, Vijaya, MD       enoxaparin  (LOVENOX ) injection 40 mg  40 mg Subcutaneous Daily Sim Re L, MD   40 mg at 05/22/24 0910   lactose free nutrition (BOOST PLUS) liquid 237 mL  237 mL Oral BID BM Akula, Vijaya, MD       latanoprost  (XALATAN ) 0.005 % ophthalmic solution 1 drop  1 drop Both Eyes QHS Akula, Vijaya, MD  metoprolol  succinate (TOPROL -XL) 24 hr tablet 12.5 mg  12.5 mg Oral Daily Sim, Mohammad L, MD   12.5 mg at 05/22/24 0909   midodrine (PROAMATINE) tablet 5 mg  5 mg Oral TID WC Akula, Vijaya, MD   5 mg at 05/22/24 1312   multivitamin with minerals tablet 1 tablet  1 tablet Oral Daily Akula, Vijaya, MD   1 tablet at 05/22/24 1311   ondansetron  (ZOFRAN ) tablet 4 mg  4 mg Oral Q6H PRN Sim Emery CROME, MD       Or   ondansetron  (ZOFRAN ) injection 4 mg  4 mg Intravenous Q6H PRN Sim Emery CROME, MD       pantoprazole  (PROTONIX ) EC tablet 40 mg  40 mg Oral Daily  Garba, Mohammad L, MD   40 mg at 05/22/24 0908   polyethylene glycol (MIRALAX  / GLYCOLAX ) packet 17 g  17 g Oral Daily Cherlyn Labella, MD   17 g at 05/22/24 1312   senna-docusate (Senokot-S) tablet 2 tablet  2 tablet Oral BID Akula, Vijaya, MD   2 tablet at 05/22/24 1311   sertraline  (ZOLOFT ) tablet 50 mg  50 mg Oral QHS Garba, Mohammad L, MD   50 mg at 05/21/24 2136   thiamine (VITAMIN B1) tablet 100 mg  100 mg Oral Daily Akula, Vijaya, MD   100 mg at 05/22/24 1311     Discharge Medications: Please see discharge summary for a list of discharge medications.  Relevant Imaging Results:  Relevant Lab Results:   Additional Information SSN: [id number]  Clarence Myers, LCSWA

## 2024-05-22 NOTE — Progress Notes (Signed)
 SLP Cancellation Note  Patient Details Name: Clarence Myers MRN: [id number] DOB: 1934/07/23   Cancelled treatment:       Reason Eval/Treat Not Completed: Fatigue/lethargy limiting ability to participate. Stopped by to see pt this morning, but he was sleeping soundly in the chair. His daughter at bedside said that he has been more lethargic today, possibly from medications. She said that when he was more alert he attempted a little breakfast but had a coughing episode with oatmeal that resulted in vomiting. She acknowledges his chronic dysphagia since previous stroke, with similar episodes over the last few years, as well as ongoing risk for aspiration that we can try to reduce with strategies, but cannot eliminate. For now, she requests that he be allowed to rest, so will f/u as able.    Leita SAILOR., M.A. CCC-SLP Acute Rehabilitation Services Office: [phone]  Secure chat preferred  05/22/2024, 11:30 AM

## 2024-05-22 NOTE — Evaluation (Signed)
 Occupational Therapy Evaluation Patient Details Name: Clarence Myers MRN: [id number] DOB: [birth year] Today's Date: 05/22/2024   History of Present Illness   Clarence Myers is a 89 y.o. male who presented to [facility] 05/20/24 for confusion, fever, nausea with vomiting as well as dysuria over the last few days. Urinalysis was positive for UTI.  CXR showed streaky opacity in the right lung base likely atelectasis or developing bronchopneumonia. PMHx: CVA with residual right-sided deficits, CAD, HLD, GERD, low back pain, depression with anxiety, and essential HTN.     Clinical Impressions Pt typically walks with a RW and assist. He can self feed and is otherwise dependent on his caregivers during the day or his daughters at night. Pt presents with significant weakness requiring +2 total to max assist for mobility and support in sitting at EOB. Pt needs max to total assist for ADLs. Patient will benefit from continued inpatient follow up therapy, <3 hours/day. If family decides to take pt home, will need lift equipment.      If plan is discharge home, recommend the following:   Two people to help with walking and/or transfers;Two people to help with bathing/dressing/bathroom;Assistance with cooking/housework;Assistance with feeding;Direct supervision/assist for medications management;Direct supervision/assist for financial management;Assist for transportation;Help with stairs or ramp for entrance     Functional Status Assessment   Patient has had a recent decline in their functional status and/or demonstrates limited ability to make significant improvements in function in a reasonable and predictable amount of time     Equipment Recommendations   Hoyer lift     Recommendations for Other Services         Precautions/Restrictions   Precautions Precautions: Fall Restrictions Weight Bearing Restrictions Per Provider Order: No     Mobility Bed Mobility Overal bed mobility:  Needs Assistance Bed Mobility: Supine to Sit     Supine to sit: Total assist, +2 for physical assistance     General bed mobility comments: decreased initiation, assist for all aspects to pivot from supine to EOB with bed pad    Transfers Overall transfer level: Needs assistance Equipment used: 2 person hand held assist Transfers: Sit to/from Stand, Bed to chair/wheelchair/BSC Sit to Stand: +2 physical assistance, Max assist     Step pivot transfers: +2 physical assistance, Max assist     General transfer comment: assist to rise and steady with bed pad under hips as pt took pivotal steps to recliner from elevated bed, difficulty advancing L foot      Balance Overall balance assessment: Needs assistance Sitting-balance support: Feet supported Sitting balance-Leahy Scale: Zero     Standing balance support: Bilateral upper extremity supported Standing balance-Leahy Scale: Zero                             ADL either performed or assessed with clinical judgement   ADL Overall ADL's : Needs assistance/impaired Eating/Feeding: Maximal assistance;Sitting Eating/Feeding Details (indicate cue type and reason): can bring cup with straw to mouth for drinking, hand over hand assist to self feed Grooming: Wash/dry hands;Maximal assistance Grooming Details (indicate cue type and reason): can wipe mouth Upper Body Bathing: Total assistance;Sitting;Bed level   Lower Body Bathing: Total assistance;+2 for physical assistance;Sit to/from stand   Upper Body Dressing : Total assistance;Sitting   Lower Body Dressing: Total assistance;+2 for physical assistance;Sit to/from stand   Toilet Transfer: +2 for physical assistance;Maximal assistance;Stand-pivot   Toileting- Clothing Manipulation and Hygiene: +2 for physical  assistance;Total assistance;Sit to/from stand               Vision Ability to See in Adequate Light: 0 Adequate Patient Visual Report: No change from  baseline       Perception         Praxis         Pertinent Vitals/Pain Pain Assessment Pain Assessment: Faces Faces Pain Scale: No hurt     Extremity/Trunk Assessment Upper Extremity Assessment Upper Extremity Assessment: Defer to OT evaluation RUE Deficits / Details: hx of CVA, weaker than typical RUE Coordination: decreased fine motor;decreased gross motor   Lower Extremity Assessment Lower Extremity Assessment: RLE deficits/detail;LLE deficits/detail RLE Deficits / Details: Hemiplegia secondary to CVA. RLE Sensation: decreased proprioception RLE Coordination: decreased gross motor LLE Deficits / Details: Decreased strength, grossly 2+/5. LLE Sensation: decreased proprioception LLE Coordination: decreased gross motor   Cervical / Trunk Assessment Cervical / Trunk Assessment: Kyphotic;Other exceptions Cervical / Trunk Exceptions: Pt resting in cervical flexion d/t weakness. Cues to improve head positioning, but quickly fatigues.   Communication Communication Communication: No apparent difficulties   Cognition Arousal: Alert Behavior During Therapy: Flat affect Cognition: Cognition impaired   Orientation impairments: Situation, Time Awareness: Intellectual awareness intact, Online awareness impaired Memory impairment (select all impairments): Short-term memory   Executive functioning impairment (select all impairments): Initiation, Sequencing                   Following commands: Impaired Following commands impaired: Follows one step commands with increased time, Follows one step commands inconsistently     Cueing  General Comments   Cueing Techniques: Verbal cues;Tactile cues;Visual cues      Exercises     Shoulder Instructions      Home Living Family/patient expects to be discharged to:: Private residence Living Arrangements: Alone;Other (Comment) (has 24 H care) Available Help at Discharge: Family;Personal care attendant;Available 24  hours/day (Caregivers during the day; Daughter stays at night; Son-in-Law prn) Type of Home: House Home Access: Stairs to enter [employer] of Steps: 2 Entrance Stairs-Rails: None Home Layout: Two level;Able to live on main level with bedroom/bathroom     Bathroom Shower/Tub: Tub/shower unit   Bathroom Toilet: Handicapped height     Home Equipment: BSC/3in1;Tub bench;Wheelchair - Forensic psychologist (2 wheels);Lift chair;Hospital bed   Additional Comments: family bumps him up the steps in his w/c      Prior Functioning/Environment Prior Level of Function : Needs assist       Physical Assist : ADLs (physical);Mobility (physical) Mobility (physical): Bed mobility;Transfers;Gait;Stairs ADLs (physical): Grooming;Bathing;Dressing;Toileting;IADLs Mobility Comments: Family reports 1+ assist for all mobility using RW. He can ambulate short distances. Family has been bumping him up in his w/c to get in/out of the house. ADLs Comments: Pt can self feed, assisted for all other ADLs and IADLs.    OT Problem List: Decreased strength;Decreased activity tolerance;Impaired balance (sitting and/or standing);Decreased coordination;Decreased cognition;Decreased knowledge of use of DME or AE;Impaired UE functional use   OT Treatment/Interventions: Self-care/ADL training;DME and/or AE instruction;Therapeutic activities;Patient/family education;Balance training      OT Goals(Current goals can be found in the care plan section)   Acute Rehab OT Goals OT Goal Formulation: With family Time For Goal Achievement: 06/05/24 Potential to Achieve Goals: Fair ADL Goals Pt Will Perform Eating: with min assist;sitting Pt Will Perform Grooming: with min assist;sitting Pt Will Transfer to Toilet: with mod assist;stand pivot transfer;bedside commode Additional ADL Goal #1: Pt will demonstrate fair sitting balance x 10 minutes  at EOB in preparation for ADLs.   OT Frequency:  Min 2X/week     Co-evaluation PT/OT/SLP Co-Evaluation/Treatment: Yes Reason for Co-Treatment: For patient/therapist safety;To address functional/ADL transfers PT goals addressed during session: Mobility/safety with mobility;Balance OT goals addressed during session: ADL's and self-care      AM-PAC OT 6 Clicks Daily Activity     Outcome Measure Help from another person eating meals?: A Lot Help from another person taking care of personal grooming?: A Lot Help from another person toileting, which includes using toliet, bedpan, or urinal?: Total Help from another person bathing (including washing, rinsing, drying)?: Total Help from another person to put on and taking off regular upper body clothing?: Total   6 Click Score: 7   End of Session Equipment Utilized During Treatment: Gait belt Nurse Communication: Mobility status;Need for lift equipment  Activity Tolerance: Patient tolerated treatment well Patient left: in chair;with call bell/phone within reach;with chair alarm set  OT Visit Diagnosis: Unsteadiness on feet (R26.81);Muscle weakness (generalized) (M62.81);Hemiplegia and hemiparesis;Other symptoms and signs involving cognitive function Hemiplegia - Right/Left: Right Hemiplegia - dominant/non-dominant: Dominant Hemiplegia - caused by: Cerebral infarction                Time: 9145-9054 OT Time Calculation (min): 51 min Charges:  OT General Charges $OT Visit: 1 Visit OT Evaluation $OT Eval Moderate Complexity: 1 Mod OT Treatments $Self Care/Home Management : 8-22 mins  Mliss HERO, OTR/L Acute Rehabilitation Services Office: [phone]   Kennth Mliss Helling 05/22/2024, 10:43 AM

## 2024-05-23 ENCOUNTER — Inpatient Hospital Stay (HOSPITAL_COMMUNITY)

## 2024-05-23 DIAGNOSIS — N3000 Acute cystitis without hematuria: Secondary | ICD-10-CM | POA: Diagnosis not present

## 2024-05-23 DIAGNOSIS — N179 Acute kidney failure, unspecified: Secondary | ICD-10-CM | POA: Diagnosis not present

## 2024-05-23 DIAGNOSIS — I10 Essential (primary) hypertension: Secondary | ICD-10-CM | POA: Diagnosis not present

## 2024-05-23 DIAGNOSIS — K219 Gastro-esophageal reflux disease without esophagitis: Secondary | ICD-10-CM | POA: Diagnosis not present

## 2024-05-23 LAB — CBC WITH DIFFERENTIAL/PLATELET
Abs Immature Granulocytes: 0.14 K/uL — ABNORMAL HIGH (ref 0.00–0.07)
Basophils Absolute: 0 K/uL (ref 0.0–0.1)
Basophils Relative: 1 %
Eosinophils Absolute: 0.2 K/uL (ref 0.0–0.5)
Eosinophils Relative: 2 %
HCT: 34.7 % — ABNORMAL LOW (ref 39.0–52.0)
Hemoglobin: 11.3 g/dL — ABNORMAL LOW (ref 13.0–17.0)
Immature Granulocytes: 2 %
Lymphocytes Relative: 11 %
Lymphs Abs: 0.9 K/uL (ref 0.7–4.0)
MCH: 29.5 pg (ref 26.0–34.0)
MCHC: 32.6 g/dL (ref 30.0–36.0)
MCV: 90.6 fL (ref 80.0–100.0)
Monocytes Absolute: 0.7 K/uL (ref 0.1–1.0)
Monocytes Relative: 8 %
Neutro Abs: 6.5 K/uL (ref 1.7–7.7)
Neutrophils Relative %: 76 %
Platelets: 206 K/uL (ref 150–400)
RBC: 3.83 MIL/uL — ABNORMAL LOW (ref 4.22–5.81)
RDW: 15 % (ref 11.5–15.5)
WBC: 8.5 K/uL (ref 4.0–10.5)
nRBC: 0 % (ref 0.0–0.2)

## 2024-05-23 LAB — CULTURE, BLOOD (ROUTINE X 2): Special Requests: ADEQUATE

## 2024-05-23 LAB — BASIC METABOLIC PANEL WITH GFR
Anion gap: 11 (ref 5–15)
BUN: 14 mg/dL (ref 8–23)
CO2: 24 mmol/L (ref 22–32)
Calcium: 7.9 mg/dL — ABNORMAL LOW (ref 8.9–10.3)
Chloride: 103 mmol/L (ref 98–111)
Creatinine, Ser: 1 mg/dL (ref 0.61–1.24)
GFR, Estimated: >60 mL/min (ref >60)
Glucose, Bld: 108 mg/dL — ABNORMAL HIGH (ref 70–99)
Potassium: 3 mmol/L — ABNORMAL LOW (ref 3.5–5.1)
Sodium: 138 mmol/L (ref 135–145)

## 2024-05-23 LAB — MAGNESIUM: Magnesium: 1.7 mg/dL (ref 1.7–2.4)

## 2024-05-23 MED ORDER — BISACODYL 10 MG RE SUPP
10.0000 mg | Freq: Once | RECTAL | Status: DC
Start: 2024-05-23 — End: 2024-05-28
  Filled 2024-05-23: qty 1

## 2024-05-23 MED ORDER — HYDROXYZINE HCL 25 MG PO TABS: 25.0000 mg | ORAL_TABLET | Freq: Three times a day (TID) | ORAL | Status: DC | PRN

## 2024-05-23 MED ORDER — POTASSIUM CHLORIDE 10 MEQ/100ML IV SOLN
10.0000 meq | INTRAVENOUS | Status: AC
  Administered 2024-05-23 (×4): 10 meq via INTRAVENOUS
  Filled 2024-05-23 (×4): qty 100

## 2024-05-23 MED ORDER — BISACODYL 10 MG RE SUPP
10.0000 mg | Freq: Once | RECTAL | Status: AC
  Administered 2024-05-23: 10 mg via RECTAL
  Filled 2024-05-23: qty 1

## 2024-05-23 MED ORDER — ACETAMINOPHEN 650 MG RE SUPP
325.0000 mg | RECTAL | Status: DC | PRN
  Administered 2024-05-23: 325 mg via RECTAL
  Filled 2024-05-23 (×2): qty 1

## 2024-05-23 MED ORDER — ACETAMINOPHEN 325 MG PO TABS: 650.0000 mg | ORAL_TABLET | ORAL | Status: DC | PRN

## 2024-05-23 MED ORDER — METOCLOPRAMIDE HCL 5 MG/ML IJ SOLN
5.0000 mg | Freq: Once | INTRAMUSCULAR | Status: AC
  Administered 2024-05-23: 5 mg via INTRAVENOUS
  Filled 2024-05-23: qty 2

## 2024-05-23 MED ORDER — PANTOPRAZOLE SODIUM 40 MG IV SOLR
40.0000 mg | INTRAVENOUS | Status: DC
  Administered 2024-05-23 – 2024-05-26 (×4): 40 mg via INTRAVENOUS
  Filled 2024-05-23 (×4): qty 10

## 2024-05-23 NOTE — Plan of Care (Signed)

## 2024-05-23 NOTE — TOC Progression Note (Addendum)
 Transition of Care [facility]) - Progression Note    Patient Details  Name: Clarence Myers MRN: [id number] Date of Birth: 1933/11/02  Transition of Care [facility]) CM/SW Contact  Paola Flynt A [place], LCSW Phone Number: 05/23/2024, 4:21 PM  Clinical Narrative:     CSW met with pt and pt's daughter [name] along at bedside. CSW provided [facility] with Medicare.gov rating bed offer, as one of pt's preferences. She was informed [facility], other preference did not provide bed offer. She said she would let CSW know her decision for SNF choice once determined.   CSW will continue to follow.   Expected Discharge Plan: Skilled Nursing Facility Barriers to Discharge: English as a second language teacher, Continued Medical Work up, SNF Pending bed offer               Expected Discharge Plan and Services       Living arrangements for the past 2 months: Single Family Home                                       Social Drivers of Health (SDOH) Interventions SDOH Screenings   Food Insecurity: No Food Insecurity (05/21/2024)  Housing: Low Risk  (05/21/2024)  Transportation Needs: No Transportation Needs (05/21/2024)  Utilities: Not At Risk (05/21/2024)  Social Connections: Socially Isolated (05/21/2024)  Tobacco Use: Low Risk  (05/20/2024)    Readmission Risk Interventions    01/13/2024   11:53 AM  Readmission Risk Prevention Plan  Transportation Screening Complete  PCP or Specialist Appt within 5-7 Days Complete  Home Care Screening Complete  Medication Review (RN CM) Complete

## 2024-05-23 NOTE — Progress Notes (Signed)
 [facility]                                                                               Clarence Myers, is a 89 y.o. male, DOB - 09/20/1933, FMW:[id number] Admit date - 05/20/2024    Outpatient Primary MD for the patient is Loreli Kins, MD  LOS - 3  days    Brief summary   Clarence Myers is a 89 y.o. male with medical history significant of history of prior CVA, coronary artery disease, hyperlipidemia, low back pain, GERD, depression with anxiety, essential hypertension who was brought in by family secondary to confusion, fever, nausea with vomiting as well as dysuria over the last few days  He was admitted for acute encephalopathy / sepsis from pneumonia and UTI.   Assessment & Plan    Assessment and Plan:   Sepsis sec to UTI and pneumonia Sepsis physiology slowly improving.  Follow up blood cultures. Blood culture shows staph epidermidis, suspect contamination. Repeat cultures ordered and pending.  SLP eval for evaluation of aspiration pneumonia done, recommended regular diet.  [place] oxygen  to keep sats greater than 90%. Improving leukocytosis.     Acute metabolic encephalopathy:  Suspect from ?  UTI  and aspiration pneumonia. MRI brain without contrast negative for acute stroke.  Worsened by IV cefepime . D/c IV CEFEPIME  and change to IV unasyn .  Unfortunately urine cultures were not obtained on admission.  Patient is more alert today, and answering all questions appropriately.  He came to know that his wife passed away in the last week of September and is grieving.     Dysphagia  SLP eval ordered.  Patient having choking episodes with solids.  Family reports patient having these choking episodes since 2 1/2 years.   AKI From sepsis and hypotension.  Resolved.    Hyperlipidemia Resume statin.     H/o CVA  Therapy evaluations ordered.  Plan for SNF on discharge.    GERD  Stable.    Hypertension:  BP parameters are optimal.   Continue with midodrine for now.    Anxiety Resume hydroxyzine.    Vomiting with some hiccups.  Abdominal x ray showing gaseous distention, suspect ileus. Moderate colonic stool burden with moderate stool distention of the rectum. Ordered IV reglan and IV protonix .  Patient started on Senna, colace and miralax  along with dulcolax suppository.  Getting CT abdomen and pelvis to evaluate for bowel obstruction.          Estimated body mass index is 21.13 kg/m as calculated from the following:   Height as of this encounter: 5' 7 (1.702 m).   Weight as of this encounter: 61.2 kg.  Code Status: full code.  DVT Prophylaxis:  enoxaparin  (LOVENOX ) injection 40 mg Start: 05/21/24 1000   Level of Care: Level of care: Telemetry Medical Family Communication: family at bedside.   Disposition Plan:     Remains inpatient appropriate: pending.   Procedures:  None.   Consultants:  none.   Antimicrobials:   Anti-infectives (From admission, onward)    Start     Dose/Rate Route Frequency Ordered Stop   05/22/24 1200  Ampicillin -Sulbactam (UNASYN ) 3 g in sodium  chloride 0.9 % 100 mL IVPB        3 g 200 mL/hr over 30 Minutes Intravenous Every 6 hours 05/22/24 1056     05/21/24 1200  ceFEPIme  (MAXIPIME ) 2 g in sodium chloride  0.9 % 100 mL IVPB  Status:  Discontinued        2 g 200 mL/hr over 30 Minutes Intravenous Every 12 hours 05/21/24 0725 05/22/24 1056   05/20/24 2330  ceFEPIme  (MAXIPIME ) 2 g in sodium chloride  0.9 % 100 mL IVPB  Status:  Discontinued        2 g 200 mL/hr over 30 Minutes Intravenous  Once 05/20/24 2323 05/20/24 2325   05/20/24 2330  ceFEPIme  (MAXIPIME ) 2 g in sodium chloride  0.9 % 100 mL IVPB  Status:  Discontinued        2 g 200 mL/hr over 30 Minutes Intravenous Every 12 hours 05/20/24 2325 05/20/24 2326   05/20/24 2330  ceFEPIme  (MAXIPIME ) 2 g in sodium chloride  0.9 % 100 mL IVPB  Status:  Discontinued        2 g 200 mL/hr over 30 Minutes Intravenous Every 24  hours 05/20/24 2326 05/21/24 0725   05/20/24 1915  azithromycin  (ZITHROMAX ) 500 mg in sodium chloride  0.9 % 250 mL IVPB        500 mg 250 mL/hr over 60 Minutes Intravenous  Once 05/20/24 1900 05/21/24 0001   05/20/24 1845  cefTRIAXone  (ROCEPHIN ) 2 g in sodium chloride  0.9 % 100 mL IVPB        2 g 200 mL/hr over 30 Minutes Intravenous  Once 05/20/24 1843 05/20/24 2131        Medications  Scheduled Meds:  bisacodyl  10 mg Rectal Once   clopidogrel   75 mg Oral Daily   cycloSPORINE   1 drop Both Eyes BID   enoxaparin  (LOVENOX ) injection  40 mg Subcutaneous Daily   lactose free nutrition  237 mL Oral BID BM   latanoprost   1 drop Both Eyes QHS   metoprolol  succinate  12.5 mg Oral Daily   midodrine  5 mg Oral TID WC   multivitamin with minerals  1 tablet Oral Daily   pantoprazole  (PROTONIX ) IV  40 mg Intravenous Q24H   polyethylene glycol  17 g Oral Daily   senna-docusate  2 tablet Oral BID   sertraline   50 mg Oral QHS   thiamine  100 mg Oral Daily   Continuous Infusions:  ampicillin -sulbactam (UNASYN ) IV Stopped (05/23/24 1217)   PRN Meds:.acetaminophen , acetaminophen , ondansetron  **OR** ondansetron  (ZOFRAN ) IV    Subjective:   Clarence Myers was seen and examined today.  No chest pain or sob. Intermittent vomiting today.    Objective:   Vitals:   05/23/24 0554 05/23/24 0736 05/23/24 1057 05/23/24 1604  BP: (!) 137/56 (!) 140/77 (!) 140/77 (!) 146/72  Pulse: (!) 44 69 69 86  Resp: 18 18  18   Temp: 98.9 F (37.2 C) 98.5 F (36.9 C)  (!) 101.7 F (38.7 C)  TempSrc: Oral Oral  Oral  SpO2: 93% 96%  98%  Weight:      Height:        Intake/Output Summary (Last 24 hours) at 05/23/2024 1746 Last data filed at 05/23/2024 1527 Gross per 24 hour  Intake 927.61 ml  Output 1200 ml  Net -272.39 ml   Filed Weights   05/20/24 1715  Weight: 61.2 kg     Exam General exam: ill appearing gentleman, not in distress. On 2l it of [place] oxygen .  Respiratory system: air entry  fair. No wheezing heard.  Cardiovascular system: S1 & S2 heard, RRR. No JVD,  Gastrointestinal system: Abdomen is nondistended, soft and nontender.  Central nervous system: Alert and oriented.  Extremities:  no pedal edema.  Skin: No rashes. Psychiatry:  crying, came to know of his wife passing and unable to attend her funeral.     Data Reviewed:  I have personally reviewed following labs and imaging studies   CBC Lab Results  Component Value Date   WBC 8.5 05/23/2024   RBC 3.83 (L) 05/23/2024   HGB 11.3 (L) 05/23/2024   HCT 34.7 (L) 05/23/2024   MCV 90.6 05/23/2024   MCH 29.5 05/23/2024   PLT 206 05/23/2024   MCHC 32.6 05/23/2024   RDW 15.0 05/23/2024   LYMPHSABS 0.9 05/23/2024   MONOABS 0.7 05/23/2024   EOSABS 0.2 05/23/2024   BASOSABS 0.0 05/23/2024     Last metabolic panel Lab Results  Component Value Date   NA 138 05/23/2024   K 3.0 (L) 05/23/2024   CL 103 05/23/2024   CO2 24 05/23/2024   BUN 14 05/23/2024   CREATININE 1.00 05/23/2024   GLUCOSE 108 (H) 05/23/2024   GFRNONAA >60 05/23/2024   GFRAA  09/02/2007    >60        The eGFR has been calculated using the MDRD equation. This calculation has not been validated in all clinical situations. eGFR's persistently <60 mL/min signify possible Chronic Kidney Disease.   CALCIUM  7.9 (L) 05/23/2024   PROT 4.1 (L) 05/21/2024   ALBUMIN 1.6 (L) 05/21/2024   BILITOT 0.5 05/21/2024   ALKPHOS 38 05/21/2024   AST 10 (L) 05/21/2024   ALT 7 05/21/2024   ANIONGAP 11 05/23/2024    CBG (last 3)  No results for input(s): GLUCAP in the last 72 hours.    Coagulation Profile: Recent Labs  Lab 05/20/24 1943 05/21/24 0903  INR 1.2 1.9*     Radiology Studies: DG Abd Portable 1V Result Date: 05/23/2024 CLINICAL DATA:  Vomiting. EXAM: PORTABLE ABDOMEN - 1 VIEW COMPARISON:  None Available. FINDINGS: Gaseous gastric distension. Few prominent loops of air-filled small bowel in the right abdomen, not particularly  dilated, greatest 2.2 cm. Moderate colonic stool burden. Moderate distention of the rectum with stool, rectal distention of 6.1 cm. Potential left renal stone. No acute osseous findings. IMPRESSION: 1. Gaseous gastric distension. Few prominent loops of air-filled small bowel in the right abdomen, favor ileus. 2. Moderate colonic stool burden with moderate stool distention of the rectum. 3. Potential left renal stone. Electronically Signed   By: Andrea Gasman M.D.   On: 05/23/2024 14:31   MR BRAIN WO CONTRAST Result Date: 05/22/2024 EXAM: MRI BRAIN WITHOUT CONTRAST 05/22/2024 05:32:15 PM TECHNIQUE: Multiplanar multisequence MRI of the head/brain was performed without the administration of intravenous contrast. COMPARISON: 10/07/2023 CLINICAL HISTORY: Mental status change, unknown cause. FINDINGS: BRAIN AND VENTRICLES: No acute infarct, intracranial hemorrhage, mass, midline shift, or hydrocephalus. Chronic blood products are present in the right parietal and temporal lobes and a small amount in the right cerebellum. Old left pontine small vessel infarct and an old cerebellar small vessel infarct are noted. Early confluent hyperintense T2-weighted signal is present within the cerebral white matter, most commonly due to chronic small vessel disease. Generalized volume loss. The sella is unremarkable. Normal flow voids. ORBITS: Ocular lens replacements. No acute abnormality. SINUSES AND MASTOIDS: No acute abnormality. BONES AND SOFT TISSUES: Normal marrow signal. No acute soft tissue abnormality. IMPRESSION: 1.  No acute intracranial abnormality. 2. Chronic small vessel disease with early confluent T2 white matter hyperintensities; old left pontine and cerebellar small vessel infarcts. 3. Generalized cerebral volume loss. Electronically signed by: Franky Stanford MD 05/22/2024 09:29 PM EDT RP Workstation: [id number]   DG CHEST PORT 1 VIEW Result Date: 05/22/2024 CLINICAL DATA:  [id number]. Recent fever, UTI,  questionable pneumonia. Follow-up exam. EXAM: PORTABLE CHEST 1 VIEW COMPARISON:  Portable chest 05/20/2024 at 6:22 p.m. FINDINGS: 5:37 a.m. There is again noted subpleural reticulation of the lung fields with a basal gradient, chronic. Stable mild cardiomegaly. Central vascular fullness is noted without edema. Mild chronic elevation right hemidiaphragm. There is increased opacity in the right base along the elevated hemidiaphragm which could be atelectasis, pneumonia or aspiration. Rest of the lungs are generally clear with a few foci of linear scarring. The mediastinum is normally outlined. No new osseous finding. Overall aeration seems unchanged. IMPRESSION: 1. Increased opacity in the right base along the elevated hemidiaphragm which could be atelectasis, pneumonia or aspiration. 2. Stable mild cardiomegaly and central vascular fullness without edema. 3. Chronic subpleural reticulation of the lung fields with a basal gradient. Electronically Signed   By: Francis Quam M.D.   On: 05/22/2024 06:11       Elgie Butter M.D. [facility] 05/23/2024, 5:46 PM  Available via Epic secure chat 7am-7pm After 7 pm, please refer to night coverage provider listed on amion.

## 2024-05-23 NOTE — Progress Notes (Signed)
 Patient's daughter,Vicki, at bedside requesting to speak with MD about MRI results. Notified Dr. Elgie Butter via secure chat of daughter's request.

## 2024-05-23 NOTE — Progress Notes (Signed)
 Patient vomited after medication administration. Notified Dr. Vijaya Akula recommended to hold po meds.

## 2024-05-23 NOTE — Progress Notes (Signed)
 Notified Dr. Elgie Butter to inquire about administration of midodrine due to patient's BP. MD advised nurse to hold morning dose.   05/23/24 0736  Vitals  Temp 98.5 F (36.9 C)  Temp Source Oral  BP (!) 140/77  MAP (mmHg) 94  BP Location Right Arm  BP Method Automatic  Patient Position (if appropriate) Lying  Pulse Rate 69  Pulse Rate Source Monitor  Resp 18  MEWS COLOR  MEWS Score Color Green  Oxygen  Therapy  SpO2 96 %  O2 Device Nasal Cannula  O2 Flow Rate (L/min) 2 L/min  MEWS Score  MEWS Temp 0  MEWS Systolic 0  MEWS Pulse 0  MEWS RR 0  MEWS LOC 0  MEWS Score 0

## 2024-05-23 NOTE — Plan of Care (Signed)
  Problem: Clinical Measurements: Goal: Signs and symptoms of infection will decrease Outcome: Progressing   Problem: Coping: Goal: Level of anxiety will decrease Outcome: Progressing   Problem: Pain Managment: Goal: General experience of comfort will improve and/or be controlled Outcome: Progressing   Problem: Safety: Goal: Ability to remain free from injury will improve Outcome: Progressing

## 2024-05-24 DIAGNOSIS — E43 Unspecified severe protein-calorie malnutrition: Secondary | ICD-10-CM

## 2024-05-24 DIAGNOSIS — K219 Gastro-esophageal reflux disease without esophagitis: Secondary | ICD-10-CM | POA: Diagnosis not present

## 2024-05-24 DIAGNOSIS — N3000 Acute cystitis without hematuria: Secondary | ICD-10-CM | POA: Diagnosis not present

## 2024-05-24 DIAGNOSIS — N179 Acute kidney failure, unspecified: Secondary | ICD-10-CM | POA: Diagnosis not present

## 2024-05-24 DIAGNOSIS — I5032 Chronic diastolic (congestive) heart failure: Secondary | ICD-10-CM

## 2024-05-24 DIAGNOSIS — I10 Essential (primary) hypertension: Secondary | ICD-10-CM | POA: Diagnosis not present

## 2024-05-24 LAB — CBC WITH DIFFERENTIAL/PLATELET
Abs Immature Granulocytes: 0.18 K/uL — ABNORMAL HIGH (ref 0.00–0.07)
Basophils Absolute: 0 K/uL (ref 0.0–0.1)
Basophils Relative: 0 %
Eosinophils Absolute: 0.3 K/uL (ref 0.0–0.5)
Eosinophils Relative: 2 %
HCT: 34.4 % — ABNORMAL LOW (ref 39.0–52.0)
Hemoglobin: 11.3 g/dL — ABNORMAL LOW (ref 13.0–17.0)
Immature Granulocytes: 1 %
Lymphocytes Relative: 11 %
Lymphs Abs: 1.6 K/uL (ref 0.7–4.0)
MCH: 29.9 pg (ref 26.0–34.0)
MCHC: 32.8 g/dL (ref 30.0–36.0)
MCV: 91 fL (ref 80.0–100.0)
Monocytes Absolute: 1.2 K/uL — ABNORMAL HIGH (ref 0.1–1.0)
Monocytes Relative: 9 %
Neutro Abs: 10.6 K/uL — ABNORMAL HIGH (ref 1.7–7.7)
Neutrophils Relative %: 77 %
Platelets: 226 K/uL (ref 150–400)
RBC: 3.78 MIL/uL — ABNORMAL LOW (ref 4.22–5.81)
RDW: 14.9 % (ref 11.5–15.5)
WBC: 13.9 K/uL — ABNORMAL HIGH (ref 4.0–10.5)
nRBC: 0 % (ref 0.0–0.2)

## 2024-05-24 LAB — BASIC METABOLIC PANEL WITH GFR
Anion gap: 10 (ref 5–15)
BUN: 15 mg/dL (ref 8–23)
CO2: 25 mmol/L (ref 22–32)
Calcium: 8 mg/dL — ABNORMAL LOW (ref 8.9–10.3)
Chloride: 102 mmol/L (ref 98–111)
Creatinine, Ser: 1.12 mg/dL (ref 0.61–1.24)
GFR, Estimated: >60 mL/min (ref >60)
Glucose, Bld: 100 mg/dL — ABNORMAL HIGH (ref 70–99)
Potassium: 3.4 mmol/L — ABNORMAL LOW (ref 3.5–5.1)
Sodium: 137 mmol/L (ref 135–145)

## 2024-05-24 MED ORDER — POTASSIUM CHLORIDE CRYS ER 20 MEQ PO TBCR
40.0000 meq | EXTENDED_RELEASE_TABLET | Freq: Once | ORAL | Status: AC
  Administered 2024-05-24: 40 meq via ORAL
  Filled 2024-05-24: qty 2

## 2024-05-24 MED ORDER — ADULT MULTIVITAMIN LIQUID CH
15.0000 mL | Freq: Every day | ORAL | Status: DC
  Administered 2024-05-25 – 2024-05-28 (×4): 15 mL via ORAL
  Filled 2024-05-24 (×6): qty 15

## 2024-05-24 NOTE — Plan of Care (Signed)
  Problem: Clinical Measurements: Goal: Will remain free from infection Outcome: Progressing   Problem: Coping: Goal: Level of anxiety will decrease Outcome: Progressing   Problem: Elimination: Goal: Will not experience complications related to bowel motility Outcome: Progressing Goal: Will not experience complications related to urinary retention Outcome: Progressing   Problem: Pain Managment: Goal: General experience of comfort will improve and/or be controlled Outcome: Progressing

## 2024-05-24 NOTE — Plan of Care (Signed)
   Problem: Clinical Measurements: Goal: Will remain free from infection Outcome: Progressing   Problem: Pain Managment: Goal: General experience of comfort will improve and/or be controlled Outcome: Progressing   Problem: Safety: Goal: Ability to remain free from injury will improve Outcome: Progressing

## 2024-05-24 NOTE — Progress Notes (Signed)
 Pharmacy sent up new oral multivitamin. Pts family refused stating that it might not taste good and she has his at home she will bring tomorrow. Pts daughter educated on how home meds brought from home must go to pharmacy to be dispensed through them. Pts daughter stated she would bring anyway and give to him and not tell us . Pts daughter educated on policy.

## 2024-05-24 NOTE — Progress Notes (Signed)
 Patient's daughter at bedside noticed red spots on both arms bilaterally. Patient is not complaining of any pain or itching in the areas. Notified Dr. Elgie Butter. Will continue to monitor if redness spreads or if patient complains of any symptoms.

## 2024-05-24 NOTE — Progress Notes (Signed)
 [facility]                                                                               Clarence Myers, is a 89 y.o. male, DOB - 05-May-1934, FMW:[id number] Admit date - 05/20/2024    Outpatient Primary MD for the patient is Clarence Kins, MD  LOS - 4  days    Brief summary   Clarence Myers is a 89 y.o. male with medical history significant of history of prior CVA, coronary artery disease, hyperlipidemia, low back pain, GERD, depression with anxiety, essential hypertension who was brought in by family secondary to confusion, fever, nausea with vomiting as well as dysuria over the last few days  He was admitted for acute encephalopathy / sepsis from pneumonia and UTI.   Assessment & Plan    Assessment and Plan:   Sepsis sec to UTI and pneumonia Sepsis physiology slowly improving.  Follow up blood cultures. Blood culture shows staph epidermidis, suspect contamination. Repeat cultures ordered and pending.  SLP eval for evaluation of aspiration pneumonia done, recommended regular diet.  Request SLP to evaluate him as he continues to have some choking episodes with food and meds.   oxygen  to keep sats greater than 90%. Improving leukocytosis.     Acute metabolic encephalopathy:  Suspect from ?  UTI  and aspiration pneumonia. MRI brain without contrast negative for acute stroke.  Worsened by IV cefepime . D/c IV CEFEPIME  and change to IV unasyn .  Unfortunately urine cultures were not obtained on admission.  Patient is more alert today, and answering all questions appropriately. No new complaints.  He came to know that his wife passed away in the last week of September and is grieving.     Dysphagia  SLP eval ordered.  Patient having choking episodes with solids.  Family reports patient having these choking episodes since 2 1/2 years.   AKI From sepsis and hypotension.  Resolved.    Hyperlipidemia Resume statin.     H/o CVA  Therapy evaluations  ordered.  Plan for SNF on discharge.    GERD  Stable.    Hypertension:  BP parameters are well controlled.  Continue with midodrine for now.    Anxiety Resume hydroxyzine.    Vomiting with some hiccups.  Abdominal x ray showing gaseous distention, suspect ileus. Moderate colonic stool burden with moderate stool distention of the rectum. Ordered IV reglan and IV protonix .  Patient started on Senna, colace and miralax  along with dulcolax suppository.   CT abdomen and pelvis is negative for bowel obstruction.          Estimated body mass index is 21.13 kg/m as calculated from the following:   Height as of this encounter: 5' 7 (1.702 m).   Weight as of this encounter: 61.2 kg.  Code Status: full code.  DVT Prophylaxis:  enoxaparin  (LOVENOX ) injection 40 mg Start: 05/21/24 1000   Level of Care: Level of care: Telemetry Medical Family Communication: family at bedside.   Disposition Plan:     Remains inpatient appropriate: pending.   Procedures:  None.   Consultants:  none.   Antimicrobials:   Anti-infectives (From admission, onward)  Start     Dose/Rate Route Frequency Ordered Stop   05/22/24 1200  Ampicillin -Sulbactam (UNASYN ) 3 g in sodium chloride  0.9 % 100 mL IVPB        3 g 200 mL/hr over 30 Minutes Intravenous Every 6 hours 05/22/24 1056     05/21/24 1200  ceFEPIme  (MAXIPIME ) 2 g in sodium chloride  0.9 % 100 mL IVPB  Status:  Discontinued        2 g 200 mL/hr over 30 Minutes Intravenous Every 12 hours 05/21/24 0725 05/22/24 1056   05/20/24 2330  ceFEPIme  (MAXIPIME ) 2 g in sodium chloride  0.9 % 100 mL IVPB  Status:  Discontinued        2 g 200 mL/hr over 30 Minutes Intravenous  Once 05/20/24 2323 05/20/24 2325   05/20/24 2330  ceFEPIme  (MAXIPIME ) 2 g in sodium chloride  0.9 % 100 mL IVPB  Status:  Discontinued        2 g 200 mL/hr over 30 Minutes Intravenous Every 12 hours 05/20/24 2325 05/20/24 2326   05/20/24 2330  ceFEPIme  (MAXIPIME ) 2 g in sodium  chloride 0.9 % 100 mL IVPB  Status:  Discontinued        2 g 200 mL/hr over 30 Minutes Intravenous Every 24 hours 05/20/24 2326 05/21/24 0725   05/20/24 1915  azithromycin  (ZITHROMAX ) 500 mg in sodium chloride  0.9 % 250 mL IVPB        500 mg 250 mL/hr over 60 Minutes Intravenous  Once 05/20/24 1900 05/21/24 0001   05/20/24 1845  cefTRIAXone  (ROCEPHIN ) 2 g in sodium chloride  0.9 % 100 mL IVPB        2 g 200 mL/hr over 30 Minutes Intravenous  Once 05/20/24 1843 05/20/24 2131        Medications  Scheduled Meds:  bisacodyl  10 mg Rectal Once   clopidogrel   75 mg Oral Daily   cycloSPORINE   1 drop Both Eyes BID   enoxaparin  (LOVENOX ) injection  40 mg Subcutaneous Daily   lactose free nutrition  237 mL Oral BID BM   latanoprost   1 drop Both Eyes QHS   metoprolol  succinate  12.5 mg Oral Daily   midodrine  5 mg Oral TID WC   multivitamin  15 mL Oral Daily   pantoprazole  (PROTONIX ) IV  40 mg Intravenous Q24H   polyethylene glycol  17 g Oral Daily   senna-docusate  2 tablet Oral BID   sertraline   50 mg Oral QHS   thiamine  100 mg Oral Daily   Continuous Infusions:  ampicillin -sulbactam (UNASYN ) IV 3 g (05/24/24 1239)   PRN Meds:.acetaminophen , acetaminophen , hydrOXYzine, ondansetron  **OR** ondansetron  (ZOFRAN ) IV    Subjective:   Clarence Myers was seen and examined today.  Still has intermittent coughing episodes with food. No vomiting today.    Objective:   Vitals:   05/24/24 0530 05/24/24 0748 05/24/24 0847 05/24/24 1538  BP: (!) 118/59 (!) 136/91 (!) 136/91 (!) 150/67  Pulse: 78 67 67 66  Resp: 17 18  17   Temp: 99.1 F (37.3 C) 98 F (36.7 C)  98.4 F (36.9 C)  TempSrc: Oral Oral  Oral  SpO2: 95% 93%  95%  Weight:      Height:        Intake/Output Summary (Last 24 hours) at 05/24/2024 1546 Last data filed at 05/24/2024 1519 Gross per 24 hour  Intake 139.22 ml  Output 1200 ml  Net -1060.78 ml   Filed Weights   05/20/24 1715  Weight: 61.2  kg      Exam General exam: ill appearing , but comfortable.  Respiratory system: diminished at bases, scattered rales.  Cardiovascular system: S1 & S2 heard, RRR. No JVD, murmer present.  Gastrointestinal system: Abdomen is nondistended, soft and nontender.  Central nervous system: Alert and oriented to person and place.  Extremities: Symmetric 5 x 5 power. Skin: No rashes, Psychiatry: Mood & affect appropriate.      Data Reviewed:  I have personally reviewed following labs and imaging studies   CBC Lab Results  Component Value Date   WBC 13.9 (H) 05/24/2024   RBC 3.78 (L) 05/24/2024   HGB 11.3 (L) 05/24/2024   HCT 34.4 (L) 05/24/2024   MCV 91.0 05/24/2024   MCH 29.9 05/24/2024   PLT 226 05/24/2024   MCHC 32.8 05/24/2024   RDW 14.9 05/24/2024   LYMPHSABS 1.6 05/24/2024   MONOABS 1.2 (H) 05/24/2024   EOSABS 0.3 05/24/2024   BASOSABS 0.0 05/24/2024     Last metabolic panel Lab Results  Component Value Date   NA 137 05/24/2024   K 3.4 (L) 05/24/2024   CL 102 05/24/2024   CO2 25 05/24/2024   BUN 15 05/24/2024   CREATININE 1.12 05/24/2024   GLUCOSE 100 (H) 05/24/2024   GFRNONAA >60 05/24/2024   GFRAA  09/02/2007    >60        The eGFR has been calculated using the MDRD equation. This calculation has not been validated in all clinical situations. eGFR's persistently <60 mL/min signify possible Chronic Kidney Disease.   CALCIUM  8.0 (L) 05/24/2024   PROT 4.1 (L) 05/21/2024   ALBUMIN 1.6 (L) 05/21/2024   BILITOT 0.5 05/21/2024   ALKPHOS 38 05/21/2024   AST 10 (L) 05/21/2024   ALT 7 05/21/2024   ANIONGAP 10 05/24/2024    CBG (last 3)  No results for input(s): GLUCAP in the last 72 hours.    Coagulation Profile: Recent Labs  Lab 05/20/24 1943 05/21/24 0903  INR 1.2 1.9*     Radiology Studies: CT ABDOMEN PELVIS WO CONTRAST Result Date: 05/23/2024 CLINICAL DATA:  Abdominal pain with suspected small bowel obstruction. EXAM: CT ABDOMEN AND PELVIS  WITHOUT CONTRAST TECHNIQUE: Multidetector CT imaging of the abdomen and pelvis was performed following the standard protocol without IV contrast. RADIATION DOSE REDUCTION: This exam was performed according to the departmental dose-optimization program which includes automated exposure control, adjustment of the mA and/or kV according to patient size and/or use of iterative reconstruction technique. COMPARISON:  The chest CT without contrast 11/08/2022. No prior abdomen and pelvis CT. Portable supine abdomen radiographs were obtained earlier today. FINDINGS: Lower chest: There are new small bilateral layering pleural effusions. Increased diffuse bronchial thickening greatest in the right lower lobe. There is background chronic interstitial lung disease with subpleural fibrosis and bronchiolectasis, cylindrical bronchiectasis in the lower lobes. There is interval increased patchy airspace disease in the right lower lobe and posterior right middle lobe, and increased ground-glass in the lingular base and left lower lobe. Suspected slight interstitial edema also. Findings favor right lower lobe pneumonia or aspiration superimposed on minimal interstitial edema, with the left-sided ground-glass disease possibly reflecting ground-glass edema or pneumonitis. The aortic valve leaflets and coronary arteries are heavily calcified. There is mild cardiomegaly. The cardiac blood pool is less dense than the myocardium suggesting anemia. Minimal pericardial effusion. Mild chronic elevation right hemidiaphragm. Hepatobiliary: Multiple small stones layer posteriorly in the gallbladder without wall thickening or bile duct dilatation. No focal liver abnormality is seen  without contrast. Pancreas: Partially atrophic. No contour deforming mass. No ductal dilatation or inflammation. Spleen: Normal in size and noncontrast attenuation. Adrenals/Urinary Tract: There is no adrenal mass. No contour deforming abnormality of the unenhanced  kidneys. Mild perinephric stranding noted with trace perinephric fluid and probably congestive etiology. There is no urinary stone or obstruction. There is mild thickening of the bladder without inflammatory changes, which is probably due to muscular hypertrophy related to prostatomegaly and chronic bladder outlet problem. Stomach/Bowel: Unremarkable contracted stomach with small hiatal hernia. Normal caliber of the unopacified small bowel. Normal caliber appendix. There is mild-to-moderate fecal stasis. Scattered sigmoid diverticulosis without evidence of diverticulitis. Moderate retained stool in the rectum without findings of acute proctitis. Vascular/Lymphatic: Aortic atherosclerosis. No enlarged abdominal or pelvic lymph nodes. Reproductive: Enlarged prostate 5.7 cm transverse, mildly impressing into bladder base. Other: Pelvic phleboliths. Minimal ascites in the abdomen or pelvis. Mild mesenteric congestive features. Mild body wall anasarca is seen in the flanks and hip regions. No free hemorrhage, free air or hernia. Musculoskeletal: Osteopenia and degenerative change thoracic and lumbar spine. Acquired spinal stenosis L4-5. IMPRESSION: 1. No evidence of small-bowel obstruction. 2. Constipation and diverticulosis. 3. Cholelithiasis without evidence of acute cholecystitis. 4. Prostatomegaly with mild bladder thickening probably due to muscular hypertrophy and chronic bladder outlet problem. 5. Aortic and coronary artery atherosclerosis with heavily calcified aortic valve leaflets. 6. Cardiomegaly with minimal pericardial effusion. 7. New small pleural effusions, mild mesenteric congestive features and body wall anasarca. 8. Increased bronchial thickening with interval increased patchy airspace disease in the right lower lobe and posterior right middle lobe, and increased ground-glass disease in the lingular base and left lower lobe. Findings favor right lower lobe pneumonia or aspiration superimposed on  minimal interstitial edema, with the left-sided ground-glass disease possibly reflecting ground-glass edema or pneumonitis. 9. Chronic interstitial lung disease with subpleural fibrosis and bronchiolectasis. 10. Osteopenia and degenerative change. Acquired spinal stenosis L4-5. Aortic Atherosclerosis (ICD10-I70.0). Electronically Signed   By: Francis Quam M.D.   On: 05/23/2024 22:19   DG Abd Portable 1V Result Date: 05/23/2024 CLINICAL DATA:  Vomiting. EXAM: PORTABLE ABDOMEN - 1 VIEW COMPARISON:  None Available. FINDINGS: Gaseous gastric distension. Few prominent loops of air-filled small bowel in the right abdomen, not particularly dilated, greatest 2.2 cm. Moderate colonic stool burden. Moderate distention of the rectum with stool, rectal distention of 6.1 cm. Potential left renal stone. No acute osseous findings. IMPRESSION: 1. Gaseous gastric distension. Few prominent loops of air-filled small bowel in the right abdomen, favor ileus. 2. Moderate colonic stool burden with moderate stool distention of the rectum. 3. Potential left renal stone. Electronically Signed   By: Andrea Gasman M.D.   On: 05/23/2024 14:31   MR BRAIN WO CONTRAST Result Date: 05/22/2024 EXAM: MRI BRAIN WITHOUT CONTRAST 05/22/2024 05:32:15 PM TECHNIQUE: Multiplanar multisequence MRI of the head/brain was performed without the administration of intravenous contrast. COMPARISON: 10/07/2023 CLINICAL HISTORY: Mental status change, unknown cause. FINDINGS: BRAIN AND VENTRICLES: No acute infarct, intracranial hemorrhage, mass, midline shift, or hydrocephalus. Chronic blood products are present in the right parietal and temporal lobes and a small amount in the right cerebellum. Old left pontine small vessel infarct and an old cerebellar small vessel infarct are noted. Early confluent hyperintense T2-weighted signal is present within the cerebral white matter, most commonly due to chronic small vessel disease. Generalized volume loss. The  sella is unremarkable. Normal flow voids. ORBITS: Ocular lens replacements. No acute abnormality. SINUSES AND MASTOIDS: No acute abnormality. BONES AND  SOFT TISSUES: Normal marrow signal. No acute soft tissue abnormality. IMPRESSION: 1. No acute intracranial abnormality. 2. Chronic small vessel disease with early confluent T2 white matter hyperintensities; old left pontine and cerebellar small vessel infarcts. 3. Generalized cerebral volume loss. Electronically signed by: Franky Stanford MD 05/22/2024 09:29 PM EDT RP Workstation: [id number]       Elgie Butter M.D. [facility] 05/24/2024, 3:46 PM  Available via Epic secure chat 7am-7pm After 7 pm, please refer to night coverage provider listed on amion.

## 2024-05-25 DIAGNOSIS — N179 Acute kidney failure, unspecified: Secondary | ICD-10-CM | POA: Diagnosis not present

## 2024-05-25 DIAGNOSIS — N3000 Acute cystitis without hematuria: Secondary | ICD-10-CM | POA: Diagnosis not present

## 2024-05-25 DIAGNOSIS — K219 Gastro-esophageal reflux disease without esophagitis: Secondary | ICD-10-CM | POA: Diagnosis not present

## 2024-05-25 DIAGNOSIS — I10 Essential (primary) hypertension: Secondary | ICD-10-CM | POA: Diagnosis not present

## 2024-05-25 LAB — BASIC METABOLIC PANEL WITH GFR
Anion gap: 10 (ref 5–15)
BUN: 14 mg/dL (ref 8–23)
CO2: 25 mmol/L (ref 22–32)
Calcium: 7.8 mg/dL — ABNORMAL LOW (ref 8.9–10.3)
Chloride: 102 mmol/L (ref 98–111)
Creatinine, Ser: 1.02 mg/dL (ref 0.61–1.24)
GFR, Estimated: >60 mL/min (ref >60)
Glucose, Bld: 93 mg/dL (ref 70–99)
Potassium: 3.3 mmol/L — ABNORMAL LOW (ref 3.5–5.1)
Sodium: 137 mmol/L (ref 135–145)

## 2024-05-25 LAB — CULTURE, BLOOD (ROUTINE X 2): Culture: NO GROWTH

## 2024-05-25 MED ORDER — GUAIFENESIN-DM 100-10 MG/5ML PO SYRP
5.0000 mL | ORAL_SOLUTION | ORAL | Status: DC | PRN
  Administered 2024-05-25 – 2024-05-26 (×4): 5 mL via ORAL
  Filled 2024-05-25 (×4): qty 5

## 2024-05-25 MED ORDER — METOCLOPRAMIDE HCL 5 MG/ML IJ SOLN
5.0000 mg | Freq: Once | INTRAMUSCULAR | Status: AC
  Administered 2024-05-25: 5 mg via INTRAVENOUS
  Filled 2024-05-25: qty 2

## 2024-05-25 MED ORDER — POTASSIUM CHLORIDE CRYS ER 20 MEQ PO TBCR
40.0000 meq | EXTENDED_RELEASE_TABLET | Freq: Two times a day (BID) | ORAL | Status: AC
  Administered 2024-05-25 – 2024-05-26 (×2): 40 meq via ORAL
  Filled 2024-05-25 (×2): qty 2

## 2024-05-25 NOTE — Plan of Care (Signed)

## 2024-05-25 NOTE — Progress Notes (Signed)
 [facility]                                                                               Clarence Myers, is a 89 y.o. male, DOB - 14-Jul-1934, FMW:[id number] Admit date - 05/20/2024    Outpatient Primary MD for the patient is Loreli Kins, MD  LOS - 5  days    Brief summary   Clarence Myers is a 89 y.o. male with medical history significant of history of prior CVA, coronary artery disease, hyperlipidemia, low back pain, GERD, depression with anxiety, essential hypertension who was brought in by family secondary to confusion, fever, nausea with vomiting as well as dysuria over the last few days  He was admitted for acute encephalopathy / sepsis from pneumonia and UTI.   Assessment & Plan    Assessment and Plan:   Sepsis sec to UTI and pneumonia Sepsis physiology slowly improving.   Blood cultures on admission shows staph epidermidis, suspect contamination. Repeat cultures ordered and negative so far. SLP eval for evaluation of aspiration pneumonia done, recommended regular diet.  Request SLP to evaluate him as he continues to have some choking episodes with food and meds. Possibly needs MBS.  [place] oxygen  to keep sats greater than 90%. Leukocytosis resolved, but wbc is up at 13,900, but he remains afebrile and alert, oriented.     Acute metabolic encephalopathy:  Suspect from ?  UTI  and aspiration pneumonia. MRI brain without contrast negative for acute stroke.  Worsened by IV cefepime . D/c IV CEFEPIME  and change to IV unasyn .  Unfortunately urine cultures were not obtained on admission.  Patient continues to be alert and conversing.  He came to know that his wife passed away in the last week of September and is grieving.     Dysphagia  SLP eval ordered. Plan for MBS IN AM.  Patient having choking episodes with solids.  Family reports patient having these choking episodes since 2 1/2 years.   AKI From sepsis and hypotension.  Resolved.     Hyperlipidemia Resume statin.     H/o CVA  Therapy evaluations ordered.  Plan for SNF on discharge.    GERD  Stable.    Hypertension:  BP parameters are optimal, will discontinue the midodrine.     Anxiety Resume hydroxyzine.    Vomiting with some hiccups.  Abdominal x ray showing gaseous distention, suspect ileus. Moderate colonic stool burden with moderate stool distention of the rectum. Ordered IV reglan and IV protonix .  Patient started on Senna, colace and miralax  along with dulcolax suppository.   CT abdomen and pelvis is negative for bowel obstruction.     Hypokalemia Replaced, check magnesium level.  Repeat BMP in am.      Estimated body mass index is 21.13 kg/m as calculated from the following:   Height as of this encounter: 5' 7 (1.702 m).   Weight as of this encounter: 61.2 kg.  Code Status: full code.  DVT Prophylaxis:  enoxaparin  (LOVENOX ) injection 40 mg Start: 05/21/24 1000   Level of Care: Level of care: Telemetry Medical Family Communication: family at bedside.   Disposition Plan:     Remains inpatient  appropriate: pending.   Procedures:  None.   Consultants:  none.   Antimicrobials:   Anti-infectives (From admission, onward)    Start     Dose/Rate Route Frequency Ordered Stop   05/22/24 1200  Ampicillin -Sulbactam (UNASYN ) 3 g in sodium chloride  0.9 % 100 mL IVPB        3 g 200 mL/hr over 30 Minutes Intravenous Every 6 hours 05/22/24 1056     05/21/24 1200  ceFEPIme  (MAXIPIME ) 2 g in sodium chloride  0.9 % 100 mL IVPB  Status:  Discontinued        2 g 200 mL/hr over 30 Minutes Intravenous Every 12 hours 05/21/24 0725 05/22/24 1056   05/20/24 2330  ceFEPIme  (MAXIPIME ) 2 g in sodium chloride  0.9 % 100 mL IVPB  Status:  Discontinued        2 g 200 mL/hr over 30 Minutes Intravenous  Once 05/20/24 2323 05/20/24 2325   05/20/24 2330  ceFEPIme  (MAXIPIME ) 2 g in sodium chloride  0.9 % 100 mL IVPB  Status:  Discontinued        2  g 200 mL/hr over 30 Minutes Intravenous Every 12 hours 05/20/24 2325 05/20/24 2326   05/20/24 2330  ceFEPIme  (MAXIPIME ) 2 g in sodium chloride  0.9 % 100 mL IVPB  Status:  Discontinued        2 g 200 mL/hr over 30 Minutes Intravenous Every 24 hours 05/20/24 2326 05/21/24 0725   05/20/24 1915  azithromycin  (ZITHROMAX ) 500 mg in sodium chloride  0.9 % 250 mL IVPB        500 mg 250 mL/hr over 60 Minutes Intravenous  Once 05/20/24 1900 05/21/24 0001   05/20/24 1845  cefTRIAXone  (ROCEPHIN ) 2 g in sodium chloride  0.9 % 100 mL IVPB        2 g 200 mL/hr over 30 Minutes Intravenous  Once 05/20/24 1843 05/20/24 2131        Medications  Scheduled Meds:  bisacodyl  10 mg Rectal Once   clopidogrel   75 mg Oral Daily   cycloSPORINE   1 drop Both Eyes BID   enoxaparin  (LOVENOX ) injection  40 mg Subcutaneous Daily   lactose free nutrition  237 mL Oral BID BM   latanoprost   1 drop Both Eyes QHS   metoprolol  succinate  12.5 mg Oral Daily   midodrine  5 mg Oral TID WC   multivitamin  15 mL Oral Daily   pantoprazole  (PROTONIX ) IV  40 mg Intravenous Q24H   polyethylene glycol  17 g Oral Daily   potassium chloride   40 mEq Oral BID   senna-docusate  2 tablet Oral BID   sertraline   50 mg Oral QHS   thiamine  100 mg Oral Daily   Continuous Infusions:  ampicillin -sulbactam (UNASYN ) IV 3 g (05/25/24 1314)   PRN Meds:.acetaminophen , acetaminophen , guaiFENesin -dextromethorphan, hydrOXYzine, ondansetron  **OR** ondansetron  (ZOFRAN ) IV    Subjective:   Shooter Tangen was seen and examined today.  Has some hiccups and coughing with dry solid foods.  Able to take liquids without any issues. He is pleasant and answering all questions appropriately.    Objective:   Vitals:   05/25/24 0421 05/25/24 0823 05/25/24 0922 05/25/24 1315  BP: (!) 144/66 (!) 140/64 (!) 122/59 118/63  Pulse: (!) 57  67   Resp: 17  17   Temp: 98.4 F (36.9 C)  98.9 F (37.2 C)   TempSrc: Oral  Oral   SpO2: 96% 95% 95%    Weight:      Height:  Intake/Output Summary (Last 24 hours) at 05/25/2024 1603 Last data filed at 05/25/2024 0420 Gross per 24 hour  Intake --  Output 1000 ml  Net -1000 ml   Filed Weights   05/20/24 1715  Weight: 61.2 kg     Exam General exam: ill appearing elderly gentleman, not in distress.  Respiratory system: Clear to auscultation. Respiratory effort normal. Cardiovascular system: S1 & S2 heard, RRR. Murmer present.  Gastrointestinal system: Abdomen is nondistended, soft and nontender.  Central nervous system: Alert and oriented. Extremities: no edema.  Skin: No rashes, Psychiatry: Mood & affect appropriate.      Data Reviewed:  I have personally reviewed following labs and imaging studies   CBC Lab Results  Component Value Date   WBC 13.9 (H) 05/24/2024   RBC 3.78 (L) 05/24/2024   HGB 11.3 (L) 05/24/2024   HCT 34.4 (L) 05/24/2024   MCV 91.0 05/24/2024   MCH 29.9 05/24/2024   PLT 226 05/24/2024   MCHC 32.8 05/24/2024   RDW 14.9 05/24/2024   LYMPHSABS 1.6 05/24/2024   MONOABS 1.2 (H) 05/24/2024   EOSABS 0.3 05/24/2024   BASOSABS 0.0 05/24/2024     Last metabolic panel Lab Results  Component Value Date   NA 137 05/25/2024   K 3.3 (L) 05/25/2024   CL 102 05/25/2024   CO2 25 05/25/2024   BUN 14 05/25/2024   CREATININE 1.02 05/25/2024   GLUCOSE 93 05/25/2024   GFRNONAA >60 05/25/2024   GFRAA  09/02/2007    >60        The eGFR has been calculated using the MDRD equation. This calculation has not been validated in all clinical situations. eGFR's persistently <60 mL/min signify possible Chronic Kidney Disease.   CALCIUM  7.8 (L) 05/25/2024   PROT 4.1 (L) 05/21/2024   ALBUMIN 1.6 (L) 05/21/2024   BILITOT 0.5 05/21/2024   ALKPHOS 38 05/21/2024   AST 10 (L) 05/21/2024   ALT 7 05/21/2024   ANIONGAP 10 05/25/2024    CBG (last 3)  No results for input(s): GLUCAP in the last 72 hours.    Coagulation Profile: Recent Labs  Lab  05/20/24 1943 05/21/24 0903  INR 1.2 1.9*     Radiology Studies: CT ABDOMEN PELVIS WO CONTRAST Result Date: 05/23/2024 CLINICAL DATA:  Abdominal pain with suspected small bowel obstruction. EXAM: CT ABDOMEN AND PELVIS WITHOUT CONTRAST TECHNIQUE: Multidetector CT imaging of the abdomen and pelvis was performed following the standard protocol without IV contrast. RADIATION DOSE REDUCTION: This exam was performed according to the departmental dose-optimization program which includes automated exposure control, adjustment of the mA and/or kV according to patient size and/or use of iterative reconstruction technique. COMPARISON:  The chest CT without contrast 11/08/2022. No prior abdomen and pelvis CT. Portable supine abdomen radiographs were obtained earlier today. FINDINGS: Lower chest: There are new small bilateral layering pleural effusions. Increased diffuse bronchial thickening greatest in the right lower lobe. There is background chronic interstitial lung disease with subpleural fibrosis and bronchiolectasis, cylindrical bronchiectasis in the lower lobes. There is interval increased patchy airspace disease in the right lower lobe and posterior right middle lobe, and increased ground-glass in the lingular base and left lower lobe. Suspected slight interstitial edema also. Findings favor right lower lobe pneumonia or aspiration superimposed on minimal interstitial edema, with the left-sided ground-glass disease possibly reflecting ground-glass edema or pneumonitis. The aortic valve leaflets and coronary arteries are heavily calcified. There is mild cardiomegaly. The cardiac blood pool is less dense than the myocardium suggesting  anemia. Minimal pericardial effusion. Mild chronic elevation right hemidiaphragm. Hepatobiliary: Multiple small stones layer posteriorly in the gallbladder without wall thickening or bile duct dilatation. No focal liver abnormality is seen without contrast. Pancreas: Partially  atrophic. No contour deforming mass. No ductal dilatation or inflammation. Spleen: Normal in size and noncontrast attenuation. Adrenals/Urinary Tract: There is no adrenal mass. No contour deforming abnormality of the unenhanced kidneys. Mild perinephric stranding noted with trace perinephric fluid and probably congestive etiology. There is no urinary stone or obstruction. There is mild thickening of the bladder without inflammatory changes, which is probably due to muscular hypertrophy related to prostatomegaly and chronic bladder outlet problem. Stomach/Bowel: Unremarkable contracted stomach with small hiatal hernia. Normal caliber of the unopacified small bowel. Normal caliber appendix. There is mild-to-moderate fecal stasis. Scattered sigmoid diverticulosis without evidence of diverticulitis. Moderate retained stool in the rectum without findings of acute proctitis. Vascular/Lymphatic: Aortic atherosclerosis. No enlarged abdominal or pelvic lymph nodes. Reproductive: Enlarged prostate 5.7 cm transverse, mildly impressing into bladder base. Other: Pelvic phleboliths. Minimal ascites in the abdomen or pelvis. Mild mesenteric congestive features. Mild body wall anasarca is seen in the flanks and hip regions. No free hemorrhage, free air or hernia. Musculoskeletal: Osteopenia and degenerative change thoracic and lumbar spine. Acquired spinal stenosis L4-5. IMPRESSION: 1. No evidence of small-bowel obstruction. 2. Constipation and diverticulosis. 3. Cholelithiasis without evidence of acute cholecystitis. 4. Prostatomegaly with mild bladder thickening probably due to muscular hypertrophy and chronic bladder outlet problem. 5. Aortic and coronary artery atherosclerosis with heavily calcified aortic valve leaflets. 6. Cardiomegaly with minimal pericardial effusion. 7. New small pleural effusions, mild mesenteric congestive features and body wall anasarca. 8. Increased bronchial thickening with interval increased patchy  airspace disease in the right lower lobe and posterior right middle lobe, and increased ground-glass disease in the lingular base and left lower lobe. Findings favor right lower lobe pneumonia or aspiration superimposed on minimal interstitial edema, with the left-sided ground-glass disease possibly reflecting ground-glass edema or pneumonitis. 9. Chronic interstitial lung disease with subpleural fibrosis and bronchiolectasis. 10. Osteopenia and degenerative change. Acquired spinal stenosis L4-5. Aortic Atherosclerosis (ICD10-I70.0). Electronically Signed   By: Francis Quam M.D.   On: 05/23/2024 22:19       Elgie Butter M.D. [facility] 05/25/2024, 4:03 PM  Available via Epic secure chat 7am-7pm After 7 pm, please refer to night coverage provider listed on amion.

## 2024-05-25 NOTE — Plan of Care (Signed)
  Problem: Respiratory: Goal: Ability to maintain adequate ventilation will improve Outcome: Progressing   Problem: Clinical Measurements: Goal: Respiratory complications will improve Outcome: Progressing   Problem: Activity: Goal: Risk for activity intolerance will decrease Outcome: Progressing   Problem: Pain Managment: Goal: General experience of comfort will improve and/or be controlled Outcome: Progressing

## 2024-05-25 NOTE — Care Management Important Message (Signed)
 Important Message  Patient Details  Name: Clarence Myers MRN: [id number] Date of Birth: 1933/12/01   Important Message Given:  Yes - Medicare IM     Jon Cruel 05/25/2024, 12:40 PM

## 2024-05-25 NOTE — Progress Notes (Signed)
 Speech Language Pathology Treatment: Dysphagia  Patient Details Name: Clarence Myers MRN: [id number] DOB: 10-09-1933 Today's Date: 05/25/2024 Time: 8954-8883 SLP Time Calculation (min) (ACUTE ONLY): 31 min  Assessment / Plan / Recommendation Clinical Impression  Pt was more alert today compared to last time SLP attempted to come by. His daughter and his caregiver share that he did well with breakfast. His daughter further shares that he had some N/V Friday and Saturday, but has not had any since then. During PO trials today, pt had an immediate cough x1 with a dry cracker, with any further coughing eliminated during PO trials. A few minutes after intake stopped, he did exhibit some delayed coughing too though. Pt/family are agreeable to changing to mechanical soft diet, also indicating that this is more consistent with what he eats at home. They are more interesting in MBS today, after having had a conversation with MD. They do reconfirm that he would not want to be on thickened liquids again, but given PNA, they would like to see how swallowing function compares to previous MBS, which was in June and showed no aspiration. Note that pt has been experiencing vomiting, which could also increase aspiration risk. After discussing with pt and daughter they would like to proceed with softening solids until MBS can be completed, which is likely on next date.    HPI HPI: 89 yo male presenting 10/1 with AMS, fever, N/V, and dysuria. Admitted with severe sepsis from UTI and possible PNA. Pt has been seen by SLP team over the last two years during multiple admissions. He was recommended to have nectar thick liquids in 2023, with which he showed improved coordination and safety compared to with thin liquids, which were aspirated. A breath hold before the swallow with thin liquids improved airway protection but pt could not consistently implement it. Repeat MBS in May 2025 was [facility] and pt has been back on thin  liquids since that time. PMH includes: CVA, GERD, CAD, HLD, low back pain, depression with anxiety, essential HTN      SLP Plan  MBS          Recommendations  Diet recommendations: Dysphagia 3 (mechanical soft);Thin liquid Liquids provided via: Cup;Straw Medication Administration: Whole meds with puree Supervision: Staff to assist with self feeding;Full supervision/cueing for compensatory strategies;Trained caregiver to feed patient Compensations: Minimize environmental distractions;Slow rate;Small sips/bites Postural Changes and/or Swallow Maneuvers: Seated upright 90 degrees;Upright 30-60 min after meal                  Oral care BID     Dysphagia, unspecified (R13.10)     MBS     Leita SAILOR., M.A. CCC-SLP Acute Rehabilitation Services Office: [phone]  Secure chat preferred   05/25/2024, 1:31 PM

## 2024-05-26 ENCOUNTER — Inpatient Hospital Stay (HOSPITAL_COMMUNITY)

## 2024-05-26 DIAGNOSIS — I10 Essential (primary) hypertension: Secondary | ICD-10-CM | POA: Diagnosis not present

## 2024-05-26 DIAGNOSIS — N179 Acute kidney failure, unspecified: Secondary | ICD-10-CM | POA: Diagnosis not present

## 2024-05-26 DIAGNOSIS — N3000 Acute cystitis without hematuria: Secondary | ICD-10-CM | POA: Diagnosis not present

## 2024-05-26 DIAGNOSIS — K219 Gastro-esophageal reflux disease without esophagitis: Secondary | ICD-10-CM | POA: Diagnosis not present

## 2024-05-26 LAB — CBC WITH DIFFERENTIAL/PLATELET
Abs Immature Granulocytes: 0.44 K/uL — ABNORMAL HIGH (ref 0.00–0.07)
Basophils Absolute: 0.1 K/uL (ref 0.0–0.1)
Basophils Relative: 0 %
Eosinophils Absolute: 0.7 K/uL — ABNORMAL HIGH (ref 0.0–0.5)
Eosinophils Relative: 6 %
HCT: 32 % — ABNORMAL LOW (ref 39.0–52.0)
Hemoglobin: 10.5 g/dL — ABNORMAL LOW (ref 13.0–17.0)
Immature Granulocytes: 4 %
Lymphocytes Relative: 17 %
Lymphs Abs: 2.1 K/uL (ref 0.7–4.0)
MCH: 29.3 pg (ref 26.0–34.0)
MCHC: 32.8 g/dL (ref 30.0–36.0)
MCV: 89.4 fL (ref 80.0–100.0)
Monocytes Absolute: 1 K/uL (ref 0.1–1.0)
Monocytes Relative: 9 %
Neutro Abs: 7.9 K/uL — ABNORMAL HIGH (ref 1.7–7.7)
Neutrophils Relative %: 64 %
Platelets: 280 K/uL (ref 150–400)
RBC: 3.58 MIL/uL — ABNORMAL LOW (ref 4.22–5.81)
RDW: 14.6 % (ref 11.5–15.5)
Smear Review: NORMAL
WBC: 12.1 K/uL — ABNORMAL HIGH (ref 4.0–10.5)
nRBC: 0 % (ref 0.0–0.2)

## 2024-05-26 LAB — BASIC METABOLIC PANEL WITH GFR
Anion gap: 10 (ref 5–15)
BUN: 11 mg/dL (ref 8–23)
CO2: 26 mmol/L (ref 22–32)
Calcium: 8 mg/dL — ABNORMAL LOW (ref 8.9–10.3)
Chloride: 103 mmol/L (ref 98–111)
Creatinine, Ser: 0.96 mg/dL (ref 0.61–1.24)
GFR, Estimated: >60 mL/min (ref >60)
Glucose, Bld: 104 mg/dL — ABNORMAL HIGH (ref 70–99)
Potassium: 3.3 mmol/L — ABNORMAL LOW (ref 3.5–5.1)
Sodium: 139 mmol/L (ref 135–145)

## 2024-05-26 LAB — MAGNESIUM: Magnesium: 1.8 mg/dL (ref 1.7–2.4)

## 2024-05-26 MED ORDER — PANTOPRAZOLE SODIUM 40 MG PO TBEC
40.0000 mg | DELAYED_RELEASE_TABLET | Freq: Every day | ORAL | Status: DC
  Administered 2024-05-27 – 2024-05-28 (×2): 40 mg via ORAL
  Filled 2024-05-26 (×2): qty 1

## 2024-05-26 MED ORDER — POTASSIUM CHLORIDE CRYS ER 20 MEQ PO TBCR
40.0000 meq | EXTENDED_RELEASE_TABLET | Freq: Two times a day (BID) | ORAL | Status: AC
  Administered 2024-05-26 – 2024-05-27 (×2): 40 meq via ORAL
  Filled 2024-05-26 (×2): qty 2

## 2024-05-26 NOTE — TOC Progression Note (Addendum)
 Transition of Care [facility]) - Progression Note    Patient Details  Name: Clarence Myers MRN: [id number] Date of Birth: 07/05/1934  [facility] CM/SW Contact  Anagabriela Jokerst LITTIE Moose, [place] Phone Number: 05/26/2024, 2:03 PM  Clinical Narrative:    CSW initiated insurance process for [name]. CSW called [employer] and started auth, [employer] will call CSW with a decision. CSW will continue to follow.   Expected Discharge Plan: Skilled Nursing Facility Barriers to Discharge: English as a second language teacher, Continued Medical Work up, SNF Pending bed offer               Expected Discharge Plan and Services       Living arrangements for the past 2 months: Single Family Home                                       Social Drivers of Health (SDOH) Interventions SDOH Screenings   Food Insecurity: No Food Insecurity (05/21/2024)  Housing: Low Risk  (05/21/2024)  Transportation Needs: No Transportation Needs (05/21/2024)  Utilities: Not At Risk (05/21/2024)  Social Connections: Socially Isolated (05/21/2024)  Tobacco Use: Low Risk  (05/20/2024)    Readmission Risk Interventions    01/13/2024   11:53 AM  Readmission Risk Prevention Plan  Transportation Screening Complete  PCP or Specialist Appt within 5-7 Days Complete  Home Care Screening Complete  Medication Review (RN CM) Complete

## 2024-05-26 NOTE — Progress Notes (Signed)
 [facility]                                                                               Clarence Myers, is a 89 y.o. male, DOB - [birth year], FMW:[id number] Admit date - 05/20/2024    Outpatient Primary MD for the patient is Loreli Kins, MD  LOS - 6  days    Brief summary   Clarence Myers is a 89 y.o. male with medical history significant of history of prior CVA, coronary artery disease, hyperlipidemia, low back pain, GERD, depression with anxiety, essential hypertension who was brought in by family secondary to confusion, fever, nausea with vomiting as well as dysuria over the last few days  He was admitted for acute encephalopathy / sepsis from pneumonia and UTI.   Assessment & Plan    Assessment and Plan:   Sepsis sec to UTI and pneumonia Sepsis physiology slowly improving.   Blood cultures on admission shows staph epidermidis, suspect contamination.  Repeat cultures ordered and negative so far. SLP eval for evaluation of aspiration pneumonia done, recommended regular diet.  Request SLP to evaluate him as he continues to have some choking episodes with food and meds. MBS done, and diet changed to dysphagia 3 diet with thin liquids.  He continues to be afebrile, and leukocytosis is improving.  He was weaned off oxygen  and is on RA.    Acute metabolic encephalopathy:  Suspect from ?  UTI  and aspiration pneumonia. MRI brain without contrast negative for acute stroke.  Worsened by IV cefepime . D/c IV CEFEPIME  and change to IV unasyn .  Unfortunately urine cultures were not obtained on admission.  Patient continues to be alert and conversing.  He came to know that his wife passed away in the last week of September and is grieving.     Dysphagia  SLP eval ordered. MBS done , changed diet.  Patient having choking episodes with solids.  Family reports patient having these choking episodes since 2 1/2 years. But with this change in diet, hopefully he will  have less episodes.   AKI From sepsis and hypotension.  Resolved.    Hyperlipidemia Resume statin.     H/o CVA  Therapy evaluations ordered.  Plan for SNF on discharge.    GERD  Stable.    Hypertension:  BP parameters are well controlled.     Anxiety Resume hydroxyzine.    Vomiting with some hiccups.  Abdominal x ray showing gaseous distention, suspect ileus. Moderate colonic stool burden with moderate stool distention of the rectum. Prn reglan for hiccups. Continue with protonix , change to oral today.  Patient started on Senna, colace and miralax  along with dulcolax suppository.   CT abdomen and pelvis is negative for bowel obstruction.     Hypokalemia Replaced, check magnesium level.  Repeat BMP in am.      Estimated body mass index is 21.13 kg/m as calculated from the following:   Height as of this encounter: 5' 7 (1.702 m).   Weight as of this encounter: 61.2 kg.  Code Status: full code.  DVT Prophylaxis:  enoxaparin  (LOVENOX ) injection 40 mg Start: 05/21/24 1000   Level of Care: Level  of care: Telemetry Medical Family Communication: family at bedside.   Disposition Plan:     Remains inpatient appropriate: snf when shara is done, pt medically stable for discharge.   Procedures:  None.   Consultants:  none.   Antimicrobials:   Anti-infectives (From admission, onward)    Start     Dose/Rate Route Frequency Ordered Stop   05/22/24 1200  Ampicillin -Sulbactam (UNASYN ) 3 g in sodium chloride  0.9 % 100 mL IVPB        3 g 200 mL/hr over 30 Minutes Intravenous Every 6 hours 05/22/24 1056     05/21/24 1200  ceFEPIme  (MAXIPIME ) 2 g in sodium chloride  0.9 % 100 mL IVPB  Status:  Discontinued        2 g 200 mL/hr over 30 Minutes Intravenous Every 12 hours 05/21/24 0725 05/22/24 1056   05/20/24 2330  ceFEPIme  (MAXIPIME ) 2 g in sodium chloride  0.9 % 100 mL IVPB  Status:  Discontinued        2 g 200 mL/hr over 30 Minutes Intravenous  Once 05/20/24 2323  05/20/24 2325   05/20/24 2330  ceFEPIme  (MAXIPIME ) 2 g in sodium chloride  0.9 % 100 mL IVPB  Status:  Discontinued        2 g 200 mL/hr over 30 Minutes Intravenous Every 12 hours 05/20/24 2325 05/20/24 2326   05/20/24 2330  ceFEPIme  (MAXIPIME ) 2 g in sodium chloride  0.9 % 100 mL IVPB  Status:  Discontinued        2 g 200 mL/hr over 30 Minutes Intravenous Every 24 hours 05/20/24 2326 05/21/24 0725   05/20/24 1915  azithromycin  (ZITHROMAX ) 500 mg in sodium chloride  0.9 % 250 mL IVPB        500 mg 250 mL/hr over 60 Minutes Intravenous  Once 05/20/24 1900 05/21/24 0001   05/20/24 1845  cefTRIAXone  (ROCEPHIN ) 2 g in sodium chloride  0.9 % 100 mL IVPB        2 g 200 mL/hr over 30 Minutes Intravenous  Once 05/20/24 1843 05/20/24 2131        Medications  Scheduled Meds:  bisacodyl  10 mg Rectal Once   clopidogrel   75 mg Oral Daily   cycloSPORINE   1 drop Both Eyes BID   enoxaparin  (LOVENOX ) injection  40 mg Subcutaneous Daily   lactose free nutrition  237 mL Oral BID BM   latanoprost   1 drop Both Eyes QHS   metoprolol  succinate  12.5 mg Oral Daily   multivitamin  15 mL Oral Daily   pantoprazole  (PROTONIX ) IV  40 mg Intravenous Q24H   polyethylene glycol  17 g Oral Daily   senna-docusate  2 tablet Oral BID   sertraline   50 mg Oral QHS   thiamine  100 mg Oral Daily   Continuous Infusions:  ampicillin -sulbactam (UNASYN ) IV 3 g (05/26/24 1134)   PRN Meds:.acetaminophen , acetaminophen , guaiFENesin -dextromethorphan, hydrOXYzine, ondansetron  **OR** ondansetron  (ZOFRAN ) IV    Subjective:   Clarence Myers was seen and examined today.  Comfortable, no pain. No new events.    Objective:   Vitals:   05/25/24 1754 05/25/24 2200 05/26/24 0500 05/26/24 1023  BP: (!) 140/58 (!) 148/60 128/60 (!) 129/52  Pulse: 65 66 71   Resp: 17 18 18 17   Temp: 98.7 F (37.1 C) 98.1 F (36.7 C) 98.4 F (36.9 C) 98.1 F (36.7 C)  TempSrc: Oral Oral Oral Oral  SpO2: 95% 92% 93% 97%  Weight:       Height:  Intake/Output Summary (Last 24 hours) at 05/26/2024 1704 Last data filed at 05/26/2024 1247 Gross per 24 hour  Intake 120 ml  Output 2000 ml  Net -1880 ml   Filed Weights   05/20/24 1715  Weight: 61.2 kg     Exam General exam: elderly gentleman not in distress.  Respiratory system: Clear to auscultation. Respiratory effort normal. Cardiovascular system: S1 & S2 heard, RRR. Murmer present.  Gastrointestinal system: Abdomen is soft, bs+ Central nervous system: Alert and oriented to place and person.  Extremities: no edema.  Skin: No rashes, Psychiatry:  Mood & affect appropriate.       Data Reviewed:  I have personally reviewed following labs and imaging studies   CBC Lab Results  Component Value Date   WBC 12.1 (H) 05/26/2024   RBC 3.58 (L) 05/26/2024   HGB 10.5 (L) 05/26/2024   HCT 32.0 (L) 05/26/2024   MCV 89.4 05/26/2024   MCH 29.3 05/26/2024   PLT 280 05/26/2024   MCHC 32.8 05/26/2024   RDW 14.6 05/26/2024   LYMPHSABS 2.1 05/26/2024   MONOABS 1.0 05/26/2024   EOSABS 0.7 (H) 05/26/2024   BASOSABS 0.1 05/26/2024     Last metabolic panel Lab Results  Component Value Date   NA 139 05/26/2024   K 3.3 (L) 05/26/2024   CL 103 05/26/2024   CO2 26 05/26/2024   BUN 11 05/26/2024   CREATININE 0.96 05/26/2024   GLUCOSE 104 (H) 05/26/2024   GFRNONAA >60 05/26/2024   GFRAA  09/02/2007    >60        The eGFR has been calculated using the MDRD equation. This calculation has not been validated in all clinical situations. eGFR's persistently <60 mL/min signify possible Chronic Kidney Disease.   CALCIUM  8.0 (L) 05/26/2024   PROT 4.1 (L) 05/21/2024   ALBUMIN 1.6 (L) 05/21/2024   BILITOT 0.5 05/21/2024   ALKPHOS 38 05/21/2024   AST 10 (L) 05/21/2024   ALT 7 05/21/2024   ANIONGAP 10 05/26/2024    CBG (last 3)  No results for input(s): GLUCAP in the last 72 hours.    Coagulation Profile: Recent Labs  Lab 05/20/24 1943  05/21/24 0903  INR 1.2 1.9*     Radiology Studies: DG Swallowing Func-Speech Pathology Result Date: 05/26/2024 Table formatting from the original result was not included. Modified Barium Swallow Study Patient Details Name: Clarence Myers MRN: [id number] Date of Birth: Nov 07, 1933 Today's Date: 05/26/2024 HPI/PMH: HPI: 89 yo male presenting 10/1 with AMS, fever, N/V, and dysuria. Admitted with severe sepsis from UTI and possible PNA. Pt has been seen by SLP team over the last two years during multiple admissions. He was recommended to have nectar thick liquids in 2023, with which he showed improved coordination and safety compared to with thin liquids, which were aspirated. A breath hold before the swallow with thin liquids improved airway protection but pt could not consistently implement it. Repeat MBS in May 2025 was [facility] and pt has been back on thin liquids since that time. PMH includes: CVA, GERD, CAD, HLD, low back pain, depression with anxiety, essential HTN Clinical Impression: Pt has a mild dysphagia but with swallow mostly functional across PO trials. Mastication and lingual propulsion are both slow, but oral clearance was complete except for one instance of impaired coordination. This occurred when pt was prompted to orally hold a thin liquid bolus, and when he started to immediately swallow, he realized that he wasn't supposed to do that. This altered his coordination  and timing, which led to spillage into the airway before the swallow (PAS 7, aspiration that was sensed but not cleared). This was a very prolonged coughing response that was noted to be different than any other coughing during the rest of the MBS, which was not related to aspiration. No further aspiration occurred on any other trials throughout MBS. Recommend that pt continue with Dys 3 diet and thin liquids with ongoing use of aspiration precautions. DIGEST Swallow Severity Rating*  Safety: 1  Efficiency: 0  Overall Pharyngeal  Swallow Severity: 1 1: mild; 2: moderate; 3: severe; 4: profound *The Dynamic Imaging Grade of Swallowing Toxicity is standardized for the head and neck cancer population, however, demonstrates promising clinical applications across populations to standardize the clinical rating of pharyngeal swallow safety and severity. Factors that may increase risk of adverse event in presence of aspiration Noe & Lianne 2021): Factors that may increase risk of adverse event in presence of aspiration Noe & Lianne 2021): Frail or deconditioned; Weak cough Recommendations/Plan: Swallowing Evaluation Recommendations Swallowing Evaluation Recommendations Recommendations: PO diet PO Diet Recommendation: Dysphagia 3 (Mechanical soft); Thin liquids (Level 0) Liquid Administration via: Cup; Straw Medication Administration: Whole meds with puree Supervision: Staff to assist with self-feeding; Full assist for feeding Swallowing strategies  : Slow rate; Small bites/sips Postural changes: Position pt fully upright for meals; Stay upright 30-60 min after meals Oral care recommendations: Oral care BID (2x/day) Treatment Plan Treatment Plan Treatment recommendations: Therapy as outlined in treatment plan below Follow-up recommendations: No SLP follow up Functional status assessment: Patient has had a recent decline in their functional status and demonstrates the ability to make significant improvements in function in a reasonable and predictable amount of time. Treatment frequency: Min 2x/week Treatment duration: 2 weeks Interventions: Aspiration precaution training; Compensatory techniques; Patient/family education; Diet toleration management by SLP Recommendations Recommendations for follow up therapy are one component of a multi-disciplinary discharge planning process, led by the attending physician.  Recommendations may be updated based on patient status, additional functional criteria and insurance authorization. Assessment:  Orofacial Exam: Orofacial Exam Oral Cavity - Dentition: Adequate natural dentition Orofacial Anatomy: WFL Anatomy: No data recorded Boluses Administered: Boluses Administered Boluses Administered: Thin liquids (Level 0); Mildly thick liquids (Level 2, nectar thick); Moderately thick liquids (Level 3, honey thick); Puree; Solid  Oral Impairment Domain: Oral Impairment Domain Lip Closure: No labial escape Tongue control during bolus hold: Posterior escape of greater than half of bolus Bolus preparation/mastication: Slow prolonged chewing/mashing with complete recollection Bolus transport/lingual motion: Slow tongue motion Oral residue: Residue collection on oral structures Location of oral residue : Tongue Initiation of pharyngeal swallow : Pyriform sinuses  Pharyngeal Impairment Domain: Pharyngeal Impairment Domain Soft palate elevation: No bolus between soft palate (SP)/pharyngeal wall (PW) Laryngeal elevation: Complete superior movement of thyroid  cartilage with complete approximation of arytenoids to epiglottic petiole Anterior hyoid excursion: Partial anterior movement Epiglottic movement: Complete inversion Laryngeal vestibule closure: Incomplete, narrow column air/contrast in laryngeal vestibule Pharyngeal stripping wave : Present - complete Pharyngeal contraction (A/P view only): N/A Pharyngoesophageal segment opening: Complete distension and complete duration, no obstruction of flow Tongue base retraction: No contrast between tongue base and posterior pharyngeal wall (PPW) Pharyngeal residue: Collection of residue within or on pharyngeal structures Location of pharyngeal residue: Pyriform sinuses  Esophageal Impairment Domain: Esophageal Impairment Domain Esophageal clearance upright position: Esophageal retention (some in the distal esophagus) Pill: Pill Consistency administered: Puree Puree: WFL Penetration/Aspiration Scale Score: Penetration/Aspiration Scale Score 1.  Material does not enter  airway: Mildly  thick liquids (Level 2, nectar thick); Moderately thick liquids (Level 3, honey thick); Puree; Solid; Pill 7.  Material enters airway, passes BELOW cords and not ejected out despite cough attempt by patient: Thin liquids (Level 0) Compensatory Strategies: No data recorded  General Information: Caregiver present: No  Diet Prior to this Study: Dysphagia 3 (mechanical soft); Thin liquids (Level 0)   Temperature : Normal   Respiratory Status: WFL   Supplemental O2: Nasal cannula   History of Recent Intubation: No  Behavior/Cognition: Alert; Cooperative; Pleasant mood; Requires cueing Self-Feeding Abilities: Needs hand-over-hand assist for feeding Baseline vocal quality/speech: Normal Volitional Cough: Able to elicit Volitional Swallow: Able to elicit Exam Limitations: No limitations Goal Planning: Prognosis for improved oropharyngeal function: Good No data recorded No data recorded Patient/Family Stated Goal: wants to pursue MBS Consulted and agree with results and recommendations: Patient Pain: Pain Assessment Pain Assessment: Faces Faces Pain Scale: 0 End of Session: Start Time:SLP Start Time (ACUTE ONLY): 0905 Stop Time: SLP Stop Time (ACUTE ONLY): 0924 Time Calculation:SLP Time Calculation (min) (ACUTE ONLY): 19 min Charges: SLP Evaluations $ SLP Speech Visit: 1 Visit SLP Evaluations $MBS Swallow: 1 Procedure $Swallowing Treatment: 1 Procedure SLP visit diagnosis: SLP Visit Diagnosis: Dysphagia, oropharyngeal phase (R13.12) Past Medical History: Past Medical History: Diagnosis Date  Acute ischemic stroke (HCC) 12/30/2021  CAD (coronary artery disease)   CVA (cerebral vascular accident) (HCC) 09/29/2021  Hyperlipidemia   Hypokalemia 09/29/2021  Low back strain   Shoulder injury   Fell and injured Right shoulder  Stroke [facility])  Past Surgical History: Past Surgical History: Procedure Laterality Date  ANGIOPLASTY    1992-LAD  CARDIAC CATHETERIZATION   Leita SAILOR., M.A. CCC-SLP Acute Rehabilitation Services Office:  [phone] Secure chat preferred 05/26/2024, 10:35 AM      Elgie Butter M.D. [facility] 05/26/2024, 5:04 PM  Available via Epic secure chat 7am-7pm After 7 pm, please refer to night coverage provider listed on amion.

## 2024-05-26 NOTE — Evaluation (Signed)
 Modified Barium Swallow Study  Patient Details  Name: Clarence Myers MRN: [id number] Date of Birth: 12/23/1933  Today's Date: 05/26/2024  Modified Barium Swallow completed.  Full report located under Chart Review in the Imaging Section.  History of Present Illness 89 yo male presenting 10/1 with AMS, fever, N/V, and dysuria. Admitted with severe sepsis from UTI and possible PNA. Pt has been seen by SLP team over the last two years during multiple admissions. He was recommended to have nectar thick liquids in 2023, with which he showed improved coordination and safety compared to with thin liquids, which were aspirated. A breath hold before the swallow with thin liquids improved airway protection but pt could not consistently implement it. Repeat MBS in May 2025 was [facility] and pt has been back on thin liquids since that time. PMH includes: CVA, GERD, CAD, HLD, low back pain, depression with anxiety, essential HTN   Clinical Impression Pt has a mild dysphagia but with swallow mostly functional across PO trials. Mastication and lingual propulsion are both slow, but oral clearance was complete except for one instance of impaired coordination. This occurred when pt was prompted to orally hold a thin liquid bolus, and when he started to immediately swallow, he realized that he wasn't supposed to do that. This altered his coordination and timing, which led to spillage into the airway before the swallow (PAS 7, aspiration that was sensed but not cleared). This was a very prolonged coughing response that was noted to be different than any other coughing during the rest of the MBS, which was not related to aspiration. No further aspiration occurred on any other trials throughout MBS. Recommend that pt continue with Dys 3 diet and thin liquids with ongoing use of aspiration precautions.  DIGEST Swallow Severity Rating*  Safety: 1  Efficiency: 0  Overall Pharyngeal Swallow Severity: 1 1: mild; 2: moderate; 3:  severe; 4: profound  *The Dynamic Imaging Grade of Swallowing Toxicity is standardized for the head and neck cancer population, however, demonstrates promising clinical applications across populations to standardize the clinical rating of pharyngeal swallow safety and severity.  Factors that may increase risk of adverse event in presence of aspiration Noe & Lianne 2021): Frail or deconditioned;Weak cough  Swallow Evaluation Recommendations Recommendations: PO diet PO Diet Recommendation: Dysphagia 3 (Mechanical soft);Thin liquids (Level 0) Liquid Administration via: Cup;Straw Medication Administration: Whole meds with puree Supervision: Staff to assist with self-feeding;Full assist for feeding Swallowing strategies  : Slow rate;Small bites/sips Postural changes: Position pt fully upright for meals;Stay upright 30-60 min after meals Oral care recommendations: Oral care BID (2x/day)      Leita SAILOR., M.A. CCC-SLP Acute Rehabilitation Services Office: [phone]  Secure chat preferred  05/26/2024,10:33 AM

## 2024-05-26 NOTE — Progress Notes (Signed)
 Pt arrived back to 6 north room 2 alert. Pain level 0/10. Daughter at bedside. Bed in lowest position. Call light in reach. All needs met at this time

## 2024-05-26 NOTE — Progress Notes (Signed)
 Pt transported to fluro via bed by transportation staff

## 2024-05-26 NOTE — Progress Notes (Signed)
 PT Cancellation Note  Patient Details Name: Clarence Myers MRN: [id number] DOB: 07-Feb-1934   Cancelled Treatment:    Reason Eval/Treat Not Completed: Patient at procedure or test/unavailable. Will check back as schedule allows.   Clarence Lipoma, PT  Acute Rehab Services Secure chat preferred Office [phone]    Clarence Myers 05/26/2024, 9:00 AM

## 2024-05-26 NOTE — Progress Notes (Signed)
 Physical Therapy Treatment Patient Details Name: Clarence Myers MRN: [id number] DOB: 06-May-1934 Today's Date: 05/26/2024   History of Present Illness Clarence Myers is a 89 y.o. male who presented to [facility] 05/20/24 for confusion, fever, nausea with vomiting as well as dysuria over the last few days. Urinalysis was positive for UTI.  CXR showed streaky opacity in the right lung base likely atelectasis or developing bronchopneumonia. PMHx: CVA with residual right-sided deficits, CAD, HLD, GERD, low back pain, depression with anxiety, and essential HTN.    PT Comments  Pt received in bed, alert and interactive, even making jokes at times. Pt needed max A to come to EOB and initially had strong posterior lean. With work on fwd wt shifting pt was able to come to neutral and sit with min A. Pt worked on STS with stedy with mod A +2. Continued to work on forced use of R side during mobility and with functional tasks. Patient will benefit from continued inpatient follow up therapy, <3 hours/day. PT will continue to follow.     If plan is discharge home, recommend the following: Two people to help with walking and/or transfers;Two people to help with bathing/dressing/bathroom;Assistance with cooking/housework;Assist for transportation;Help with stairs or ramp for entrance   Can travel by private vehicle     No  Equipment Recommendations  Hoyer lift    Recommendations for Other Services       Precautions / Restrictions Precautions Precautions: Fall Recall of Precautions/Restrictions: Intact Restrictions Weight Bearing Restrictions Per Provider Order: No     Mobility  Bed Mobility Overal bed mobility: Needs Assistance Bed Mobility: Supine to Sit     Supine to sit: Max assist     General bed mobility comments: pt able to bridge knees but needed max A to bring them off EOB and elevate trunk to L into sitting. Pt with posterior lean and hips in extension initially. Improved with fwd  leaning activities    Transfers Overall transfer level: Needs assistance Equipment used: Ambulation equipment used Transfers: Sit to/from Stand, Bed to chair/wheelchair/BSC Sit to Stand: +2 physical assistance, Mod assist   Step pivot transfers: +2 physical assistance, Total assist       General transfer comment: pt initiates STS but has difficulty with active hip and trunk extension. Needed manual facilitation for this. Practiced 4x from flaps of stedy for strengthening. Last rep needed only min A Transfer via Lift Equipment: Stedy  Ambulation/Gait                   Stairs             Wheelchair Mobility     Tilt Bed    Modified Rankin (Stroke Patients Only)       Balance Overall balance assessment: Needs assistance Sitting-balance support: Feet supported Sitting balance-Leahy Scale: Poor Sitting balance - Comments: pt needed max A in sitting initially due to posterior bias. However, improved to min A. Worked on fwd reaching down to feet when sitting in chair as well as maintaining balance with overhead reaching. Postural control: Posterior lean, Right lateral lean Standing balance support: Bilateral upper extremity supported, During functional activity Standing balance-Leahy Scale: Poor Standing balance comment: worked on maintaining midline in standing as well as extending through hips. Needed mod A for this                            Communication Communication Communication: No apparent difficulties  Cognition Arousal: Alert Behavior During Therapy: WFL for tasks assessed/performed   PT - Cognitive impairments: Initiation, Sequencing                       PT - Cognition Comments: pt alert today and making jokes. Has delayed processing and problem solving and benefits from multimodal cues. Proprioception deficts noted with mobility Following commands: Impaired Following commands impaired: Follows one step commands with  increased time, Follows one step commands inconsistently    Cueing Cueing Techniques: Verbal cues, Tactile cues, Visual cues  Exercises General Exercises - Lower Extremity Ankle Circles/Pumps: AROM, Both, 10 reps, Seated Long Arc Quad: AROM, Both, 10 reps, Seated    General Comments General comments (skin integrity, edema, etc.): when taking straw to lips undershoots. Same with R hand to stedy bar. Vision very poor which is also contributing      Pertinent Vitals/Pain Pain Assessment Pain Assessment: Faces Faces Pain Scale: Hurts little more Pain Location: generalized with mobility Pain Descriptors / Indicators: Sore Pain Intervention(s): Limited activity within patient's tolerance, Monitored during session    Home Living                          Prior Function            PT Goals (current goals can now be found in the care plan section) Acute Rehab PT Goals Patient Stated Goal: Maintain the most mobility I can PT Goal Formulation: With patient/family Time For Goal Achievement: 06/05/24 Potential to Achieve Goals: Fair Progress towards PT goals: Progressing toward goals    Frequency    Min 2X/week      PT Plan      Co-evaluation              AM-PAC PT 6 Clicks Mobility   Outcome Measure  Help needed turning from your back to your side while in a flat bed without using bedrails?: A Lot Help needed moving from lying on your back to sitting on the side of a flat bed without using bedrails?: A Lot Help needed moving to and from a bed to a chair (including a wheelchair)?: Total Help needed standing up from a chair using your arms (e.g., wheelchair or bedside chair)?: Total Help needed to walk in hospital room?: Total Help needed climbing 3-5 steps with a railing? : Total 6 Click Score: 8    End of Session Equipment Utilized During Treatment: Gait belt Activity Tolerance: Patient tolerated treatment well Patient left: in chair;with call  bell/phone within reach;with chair alarm set;with family/visitor present Nurse Communication: Mobility status;Need for lift equipment (stedy vs. maximove) PT Visit Diagnosis: Muscle weakness (generalized) (M62.81);Difficulty in walking, not elsewhere classified (R26.2);Unsteadiness on feet (R26.81)     Time: 1410-1440 PT Time Calculation (min) (ACUTE ONLY): 30 min  Charges:    $Therapeutic Exercise: 8-22 mins $Therapeutic Activity: 8-22 mins PT General Charges $$ ACUTE PT VISIT: 1 Visit                     Richerd Lipoma, PT  Acute Rehab Services Secure chat preferred Office [phone]    [place] L Hayes Czaja 05/26/2024, 3:00 PM

## 2024-05-27 DIAGNOSIS — N3000 Acute cystitis without hematuria: Secondary | ICD-10-CM | POA: Diagnosis not present

## 2024-05-27 LAB — CBC WITH DIFFERENTIAL/PLATELET
Abs Immature Granulocytes: 0.43 K/uL — ABNORMAL HIGH (ref 0.00–0.07)
Basophils Absolute: 0.1 K/uL (ref 0.0–0.1)
Basophils Relative: 1 %
Eosinophils Absolute: 0.7 K/uL — ABNORMAL HIGH (ref 0.0–0.5)
Eosinophils Relative: 6 %
HCT: 32.9 % — ABNORMAL LOW (ref 39.0–52.0)
Hemoglobin: 10.9 g/dL — ABNORMAL LOW (ref 13.0–17.0)
Immature Granulocytes: 4 %
Lymphocytes Relative: 17 %
Lymphs Abs: 2 K/uL (ref 0.7–4.0)
MCH: 29.5 pg (ref 26.0–34.0)
MCHC: 33.1 g/dL (ref 30.0–36.0)
MCV: 89.2 fL (ref 80.0–100.0)
Monocytes Absolute: 0.9 K/uL (ref 0.1–1.0)
Monocytes Relative: 8 %
Neutro Abs: 7.9 K/uL — ABNORMAL HIGH (ref 1.7–7.7)
Neutrophils Relative %: 64 %
Platelets: 303 K/uL (ref 150–400)
RBC: 3.69 MIL/uL — ABNORMAL LOW (ref 4.22–5.81)
RDW: 14.8 % (ref 11.5–15.5)
Smear Review: NORMAL
WBC: 12 K/uL — ABNORMAL HIGH (ref 4.0–10.5)
nRBC: 0 % (ref 0.0–0.2)

## 2024-05-27 LAB — BASIC METABOLIC PANEL WITH GFR
Anion gap: 12 (ref 5–15)
BUN: 10 mg/dL (ref 8–23)
CO2: 24 mmol/L (ref 22–32)
Calcium: 8 mg/dL — ABNORMAL LOW (ref 8.9–10.3)
Chloride: 101 mmol/L (ref 98–111)
Creatinine, Ser: 0.92 mg/dL (ref 0.61–1.24)
GFR, Estimated: >60 mL/min (ref >60)
Glucose, Bld: 98 mg/dL (ref 70–99)
Potassium: 3.7 mmol/L (ref 3.5–5.1)
Sodium: 137 mmol/L (ref 135–145)

## 2024-05-27 MED ORDER — ROSUVASTATIN CALCIUM 20 MG PO TABS
20.0000 mg | ORAL_TABLET | Freq: Every day | ORAL | Status: DC
  Administered 2024-05-27: 20 mg via ORAL
  Filled 2024-05-27: qty 1

## 2024-05-27 MED ORDER — AMOXICILLIN-POT CLAVULANATE 875-125 MG PO TABS
1.0000 | ORAL_TABLET | Freq: Two times a day (BID) | ORAL | Status: DC
  Administered 2024-05-27 – 2024-05-28 (×3): 1 via ORAL
  Filled 2024-05-27 (×3): qty 1

## 2024-05-27 MED ORDER — CHLORPROMAZINE HCL 25 MG PO TABS
25.0000 mg | ORAL_TABLET | Freq: Once | ORAL | Status: AC
Start: 2024-05-28 — End: 2024-05-27
  Administered 2024-05-27: 25 mg via ORAL
  Filled 2024-05-27: qty 1

## 2024-05-27 NOTE — Progress Notes (Signed)
 Occupational Therapy Treatment Patient Details Name: Clarence Myers MRN: [id number] DOB: 1933/11/11 Today's Date: 05/27/2024   History of present illness Clarence Myers is a 89 y.o. male who presented to [facility] 05/20/24 for confusion, fever, nausea with vomiting as well as dysuria over the last few days. Urinalysis was positive for UTI.  CXR showed streaky opacity in the right lung base likely atelectasis or developing bronchopneumonia. PMHx: CVA with residual right-sided deficits, CAD, HLD, GERD, low back pain, depression with anxiety, and essential HTN.   OT comments  Pt with overall increased strength. One person assist for supine to sit. Stood with stedy and +2 min assist from bed and from platforms of stedy x 3 with CGA. Min assist to change gown. Requested pt feed himself next meal and encouraged use of IS. Caregiver in room and voiced understanding. Patient will benefit from continued inpatient follow up therapy, <3 hours/day.      If plan is discharge home, recommend the following:  Two people to help with walking and/or transfers;Two people to help with bathing/dressing/bathroom;Assistance with cooking/housework;Assistance with feeding;Direct supervision/assist for medications management;Direct supervision/assist for financial management;Assist for transportation;Help with stairs or ramp for entrance   Equipment Recommendations  Hoyer lift    Recommendations for Other Services      Precautions / Restrictions Precautions Precautions: Fall Recall of Precautions/Restrictions: Intact Restrictions Weight Bearing Restrictions Per Provider Order: No       Mobility Bed Mobility Overal bed mobility: Needs Assistance Bed Mobility: Supine to Sit     Supine to sit: Max assist, HOB elevated     General bed mobility comments: increased time, assist for all aspects    Transfers Overall transfer level: Needs assistance Equipment used: Ambulation equipment used Transfers: Sit  to/from Stand Sit to Stand: +2 physical assistance, Min assist           General transfer comment: +2 min from elevated bed with stedy, x 3 from stedy platforms with CGA Transfer via Lift Equipment: Stedy   Balance Overall balance assessment: Needs assistance   Sitting balance-Leahy Scale: Poor Sitting balance - Comments: max initially, progressed to light min Postural control: Posterior lean, Right lateral lean Standing balance support: Bilateral upper extremity supported, During functional activity Standing balance-Leahy Scale: Poor Standing balance comment: CGA in stedy                           ADL either performed or assessed with clinical judgement   ADL Overall ADL's : Needs assistance/impaired                 Upper Body Dressing : Minimal assistance;Sitting                          Extremity/Trunk Assessment              Vision       Perception     Praxis     Communication Communication Communication: Impaired Factors Affecting Communication: Hearing impaired   Cognition Arousal: Alert Behavior During Therapy: WFL for tasks assessed/performed Cognition: Cognition impaired       Memory impairment (select all impairments): Short-term memory   Executive functioning impairment (select all impairments): Initiation, Sequencing                   Following commands: Impaired Following commands impaired: Follows one step commands with increased time      Cueing  Cueing Techniques: Verbal cues, Gestural cues  Exercises      Shoulder Instructions       General Comments      Pertinent Vitals/ Pain       Pain Assessment Pain Assessment: No/denies pain  Home Living                                          Prior Functioning/Environment              Frequency  Min 2X/week        Progress Toward Goals  OT Goals(current goals can now be found in the care plan section)  Progress  towards OT goals: Progressing toward goals  Acute Rehab OT Goals OT Goal Formulation: With family Time For Goal Achievement: 06/05/24 Potential to Achieve Goals: Fair  Plan      Co-evaluation                 AM-PAC OT 6 Clicks Daily Activity     Outcome Measure   Help from another person eating meals?: A Lot Help from another person taking care of personal grooming?: A Lot Help from another person toileting, which includes using toliet, bedpan, or urinal?: Total Help from another person bathing (including washing, rinsing, drying)?: Total Help from another person to put on and taking off regular upper body clothing?: A Little Help from another person to put on and taking off regular lower body clothing?: Total 6 Click Score: 10    End of Session Equipment Utilized During Treatment: Gait belt  OT Visit Diagnosis: Unsteadiness on feet (R26.81);Muscle weakness (generalized) (M62.81);Hemiplegia and hemiparesis;Other symptoms and signs involving cognitive function Hemiplegia - Right/Left: Right Hemiplegia - dominant/non-dominant: Dominant Hemiplegia - caused by: Cerebral infarction   Activity Tolerance Patient tolerated treatment well   Patient Left in chair;with call bell/phone within reach;with chair alarm set;with family/visitor present   Nurse Communication Mobility status;Need for lift equipment        Time: [id number] OT Time Calculation (min): 24 min  Charges: OT General Charges $OT Visit: 1 Visit OT Treatments $Self Care/Home Management : 8-22 mins $Therapeutic Activity: 8-22 mins  Mliss HERO, OTR/L Acute Rehabilitation Services Office: [phone]   Clarence Myers 05/27/2024, 3:45 PM

## 2024-05-27 NOTE — Progress Notes (Signed)
 Nutrition Follow-up  DOCUMENTATION CODES:   Severe malnutrition in context of chronic illness  INTERVENTION:  Encourgae PO intake  Boost Plus BID, each supplement provides 360 kcal and 14 gm protein *Prefers chocolate  Magic cup TID with meals, each supplement provides 290 kcal and 9 grams of protein *Prefers vanilla  MVI with minerals daily  100 mg Thiamine  dialy  High calorie, high protein handout in AVS Recommend cortrak If within goals of care if pt becomes more confused and intake decreases    *Has shellfish allergy   NUTRITION DIAGNOSIS:   Severe Malnutrition related to chronic illness as evidenced by severe muscle depletion, severe fat depletion, energy intake < or equal to 75% for > or equal to 1 month, meal completion < 25%, percent weight loss (Pt has had a 10 lb wt loss, 7%, in 6 months.). - Ongoing   GOAL:   Patient will meet greater than or equal to 90% of their needs - Progressing   MONITOR:   PO intake, Supplement acceptance, Labs  REASON FOR ASSESSMENT:   Consult Assessment of nutrition requirement/status  ASSESSMENT:   89 y.o. male with PMH  of prior CVA (2023), CAD, HLD, CHF,   low back pain, GERD, depression, anxiety, HTN, chronic dysphagia, presented with AMS, nausea and vomiting, Dysuria. Admitted for severe sepsis most likely due to UTI.  10/2 - SLP eval->regular diet, thin liquids, recommended MBS but family refused  10/7 - MBS diet downgraded to Dysphagia 3, thin liquids    Pt still with fluctuating PO intake even with diet downgraded but mental status seems to have improved. Continues to only eats bites-25% of his meals. Has been drinking at least 1 Boos Plus shake per day, sometimes will drink 2. RN unsure how pt was eating for breakfast or lunch today. Did have 1 Boost shake today so far. Will try to optimize intake with ONS, however if pt becomes more confused, may need cortrak. Per MD medically stable, awaiting SNF.   Admit weight: 61.2 kg   Current weight: 61.2 kg   66.2 kg, 145 lbs in April 2025, 10 lb wt loss, 7% wt loss in 6 months.  Average Meal Intake: 10/6-10/7: 25% intake x 6 recorded meals  Nutritionally Relevant Medications: Scheduled Meds:  lactose free nutrition  237 mL Oral BID BM   multivitamin  15 mL Oral Daily   rosuvastatin   20 mg Oral QHS   senna-docusate  2 tablet Oral BID   thiamine  100 mg Oral Daily   Labs Reviewed: CBG ranges from 98-104 mg/dL over the last 24 hours HgbA1c 5.6   Diet Order:   Diet Order             DIET DYS 3 Room service appropriate? Yes with Assist; Fluid consistency: Thin  Diet effective now                   EDUCATION NEEDS:   Education needs have been addressed  Skin:  Skin Assessment: Reviewed RN Assessment  Last BM:  10/7  Height:   Ht Readings from Last 1 Encounters:  05/20/24 5' 7 (1.702 m)    Weight:   Wt Readings from Last 1 Encounters:  05/20/24 61.2 kg    Ideal Body Weight:  67.3 kg  BMI:  Body mass index is 21.13 kg/m.  Estimated Nutritional Needs:   Kcal:  1500-1800 kcal  Protein:  80-100 gm  Fluid:  >1.5L/day   Olivia Kenning, RD Registered Dietitian  See Amion for more information

## 2024-05-27 NOTE — TOC Progression Note (Signed)
 Transition of Care [facility]) - Progression Note    Patient Details  Name: Clarence Myers MRN: [id number] Date of Birth: [birth year]  [facility] CM/SW Contact  Darroll Bredeson LITTIE Mo[place], [place] Phone Number: 05/27/2024, 3:54 PM  Clinical Narrative:    CSW received a call from [employer] rep stating pt had insurance approval for ambulance transportation, ref# [phone] but the SNF auth is still under medical review, they are hoping to have an answer by tomorrow and will contact CSW with decision. CSW will continue to follow.   Expected Discharge Plan: Skilled Nursing Facility Barriers to Discharge: English as a second language teacher, Continued Medical Work up, SNF Pending bed offer               Expected Discharge Plan and Services       Living arrangements for the past 2 months: Single Family Home                                       Social Drivers of Health (SDOH) Interventions SDOH Screenings   Food Insecurity: No Food Insecurity (05/21/2024)  Housing: Low Risk  (05/21/2024)  Transportation Needs: No Transportation Needs (05/21/2024)  Utilities: Not At Risk (05/21/2024)  Social Connections: Socially Isolated (05/21/2024)  Tobacco Use: Low Risk  (05/20/2024)    Readmission Risk Interventions    01/13/2024   11:53 AM  Readmission Risk Prevention Plan  Transportation Screening Complete  PCP or Specialist Appt within 5-7 Days Complete  Home Care Screening Complete  Medication Review (RN CM) Complete

## 2024-05-27 NOTE — Progress Notes (Signed)
 PROGRESS NOTE  Clarence Myers  DOB: 05/14/1934  PCP: Loreli Kins, MD FMW:[id number]  DOA: 05/20/2024  LOS: 7 days  Hospital Day: 8  Subjective: Patient was seen and examined this morning. Elderly Caucasian male.  Propped up in bed.  Not in distress.  On low-flow oxygen .  Daughter at bedside. In the last 24 hours, afebrile, hemodynamically stable, breathing on 2 L Most recent labs from this morning with WC count 12, hemoglobin 10.9, BMP unremarkable,  Brief narrative: Clarence Myers is a 89 y.o. male with PMH significant for HTN, HLD, CAD, CVA, low back pain, GERD, anxiety/depression 10/1, patient was brought to the ED by family for confusion, fever, dysuria, nausea, vomiting for few days.  Also has intermittent poor oral intake, generalized weakness.  In the ED, patient was septic with fever, low blood pressure Workup suggested UTI, pneumonia Admitted to [facility] Hospital course as below Seen by PT.  SNF recommended.  Pending placement currently  Assessment and plan: Sepsis POA Secondary UTI and pneumonia. See below for management of individual issues. Sepsis physiology improving with antibiotics. No longer having fever.  WBC count improving. Recent Labs  Lab 05/20/24 1949 05/21/24 0035 05/21/24 0041 05/21/24 0903 05/22/24 0310 05/23/24 0953 05/24/24 0217 05/26/24 1326 05/27/24 0457  WBC  --    < >  --    < > 17.7* 8.5 13.9* 12.1* 12.0*  LATICACIDVEN 1.1  --  1.1  --   --   --   --   --   --    < > = values in this interval not displayed.   Aspiration pneumonia Chronic dysphagia Initial chest x-ray showed streaky opacities in the right lung base.Family reports patient having these choking episodes since 2 1/2 years.   In the hospital as well he was noted to have choking episodes.  Seen by speech therapist.  MBS done.  Currently on dysphagia 3 diet Currently on IV Unasyn  On as needed oxygen  at home.  Currently on low-flow oxygen .  Expected improvement once  mobility improves.  UTI Urinalysis showed turbid yellow urine with moderate hemoglobin, moderate leukocytes, rare bacteria Treated with IV antibiotics  Staph epidermidis in blood culture Likely contamination.  Repeat cultures negative   Acute metabolic encephalopathy Secondary to sepsis, UTI, pneumonia MRI brain negative for acute stroke. Mental status initially worsened in the hospital and improved after cefepime  was changed to Unasyn . Mental status is better.  He is alert and conversational He came to know that his wife passed away in the last week of September and is grieving.  PTA meds- Zoloft  50 mg nightly, Remeron  15 mg nightly Currently continued on Zoloft  50 mg nightly, hydroxyzine as needed   AKI From sepsis and hypotension.  Creatinine improved to normal.  Recent Labs    01/31/24 0433 02/03/24 0440 02/04/24 0536 05/20/24 1719 05/21/24 0035 05/21/24 0903 05/23/24 0953 05/24/24 0217 05/25/24 0642 05/26/24 0559 05/27/24 0457  BUN 32* 20 25* 38*  --  22 14 15 14 11 10   CREATININE 1.07 0.92 0.88 1.40* 1.25* 0.79 1.00 1.12 1.02 0.96 0.92  CO2 24 24 22 22   --  16* 24 25 25 26 24    Hypokalemia Improved with replacement Recent Labs  Lab 05/23/24 0309 05/23/24 0953 05/24/24 0217 05/25/24 0642 05/26/24 0559 05/26/24 1326 05/27/24 0457  K  --  3.0* 3.4* 3.3* 3.3*  --  3.7  MG 1.7  --   --   --   --  1.8  --  H/o CVA, HLD Aortic and coronary artery atherosclerosis PTA meds- Plavix  75 mg daily, Crestor  20 mg daily Continue both  Hypertension PTA meds- Toprol  12.5 mg daily Currently continued on Toprol  12.5 mg daily,   GERD  Stable   Constipation 10/4, abdomen x-ray showed gastric gaseous distention with few prominent loops of air-filled small bowel in the right abdomen favor ileus.  Moderate colonic stool burden and distention of rectum with stool. 10/4, CT abdomen ruled out bowel obstruction. Continue bowel regimen with Senokot twice daily, MiraLAX   daily  BPH 10/4, CT abdomen showed prostatomegaly with mild bladder thickening probably due to muscular hypertrophy and chronic problem.  Generalized weakness PT eval obtained.  SNF recommended   Mobility:  PT Orders: Active   PT Follow up Rec: Skilled Nursing-Short Term Rehab (<3 Hours/Day)05/26/2024 1453   Goals of care   Code Status: Full Code     DVT prophylaxis:  enoxaparin  (LOVENOX ) injection 40 mg Start: 05/21/24 1000   Antimicrobials: IV Unasyn .  Plan to switch to oral Augmentin  today Fluid: None Consultants: None Family Communication: Daughter at bedside  Status: Inpatient Level of care:  Telemetry Medical   Patient is from: Home Needs to continue in-hospital care: Clinically improving.  Pending SNF authorization.   Diet:  Diet Order             DIET DYS 3 Room service appropriate? Yes with Assist; Fluid consistency: Thin  Diet effective now                   Scheduled Meds:  bisacodyl  10 mg Rectal Once   clopidogrel   75 mg Oral Daily   cycloSPORINE   1 drop Both Eyes BID   enoxaparin  (LOVENOX ) injection  40 mg Subcutaneous Daily   lactose free nutrition  237 mL Oral BID BM   latanoprost   1 drop Both Eyes QHS   metoprolol  succinate  12.5 mg Oral Daily   multivitamin  15 mL Oral Daily   pantoprazole   40 mg Oral Q0600   polyethylene glycol  17 g Oral Daily   rosuvastatin   20 mg Oral QHS   senna-docusate  2 tablet Oral BID   sertraline   50 mg Oral QHS   thiamine  100 mg Oral Daily    PRN meds: acetaminophen , acetaminophen , guaiFENesin -dextromethorphan, hydrOXYzine, ondansetron  **OR** ondansetron  (ZOFRAN ) IV   Infusions:   ampicillin -sulbactam (UNASYN ) IV 3 g (05/27/24 1115)    Antimicrobials: Anti-infectives (From admission, onward)    Start     Dose/Rate Route Frequency Ordered Stop   05/22/24 1200  Ampicillin -Sulbactam (UNASYN ) 3 g in sodium chloride  0.9 % 100 mL IVPB        3 g 200 mL/hr over 30 Minutes Intravenous Every 6 hours  05/22/24 1056     05/21/24 1200  ceFEPIme  (MAXIPIME ) 2 g in sodium chloride  0.9 % 100 mL IVPB  Status:  Discontinued        2 g 200 mL/hr over 30 Minutes Intravenous Every 12 hours 05/21/24 0725 05/22/24 1056   05/20/24 2330  ceFEPIme  (MAXIPIME ) 2 g in sodium chloride  0.9 % 100 mL IVPB  Status:  Discontinued        2 g 200 mL/hr over 30 Minutes Intravenous  Once 05/20/24 2323 05/20/24 2325   05/20/24 2330  ceFEPIme  (MAXIPIME ) 2 g in sodium chloride  0.9 % 100 mL IVPB  Status:  Discontinued        2 g 200 mL/hr over 30 Minutes Intravenous Every 12 hours 05/20/24  2325 05/20/24 2326   05/20/24 2330  ceFEPIme  (MAXIPIME ) 2 g in sodium chloride  0.9 % 100 mL IVPB  Status:  Discontinued        2 g 200 mL/hr over 30 Minutes Intravenous Every 24 hours 05/20/24 2326 05/21/24 0725   05/20/24 1915  azithromycin  (ZITHROMAX ) 500 mg in sodium chloride  0.9 % 250 mL IVPB        500 mg 250 mL/hr over 60 Minutes Intravenous  Once 05/20/24 1900 05/21/24 0001   05/20/24 1845  cefTRIAXone  (ROCEPHIN ) 2 g in sodium chloride  0.9 % 100 mL IVPB        2 g 200 mL/hr over 30 Minutes Intravenous  Once 05/20/24 1843 05/20/24 2131       Objective: Vitals:   05/27/24 0547 05/27/24 1009  BP: (!) 146/67 135/68  Pulse: (!) 59 63  Resp:  18  Temp: 98.4 F (36.9 C) 98.4 F (36.9 C)  SpO2: 97% 95%    Intake/Output Summary (Last 24 hours) at 05/27/2024 1335 Last data filed at 05/26/2024 2030 Gross per 24 hour  Intake 120 ml  Output 800 ml  Net -680 ml   Filed Weights   05/20/24 1715  Weight: 61.2 kg   Weight change:  Body mass index is 21.13 kg/m.   Physical Exam: General exam: Pleasant, elderly Caucasian male Skin: No rashes, lesions or ulcers. HEENT: Atraumatic, normocephalic, no obvious bleeding Lungs: Diminished air entry in both bases.  Otherwise clear to auscultation bilaterally CVS: S1, S2, no murmur,   GI/Abd: Soft, nontender, nondistended, bowel sound present,   CNS: Alert, awake, oriented X  3 Psychiatry: Mood appropriate Extremities: No pedal edema, no calf tenderness,   Data Review: I have personally reviewed the laboratory data and studies available.  F/u labs ordered Unresulted Labs (From admission, onward)     Start     Ordered   05/28/24 0500  Basic metabolic panel with GFR  Tomorrow morning,   R        05/27/24 1335   05/28/24 0500  CBC with Differential/Platelet  Tomorrow morning,   R        05/27/24 1335   05/27/24 0500  Creatinine, serum  (enoxaparin  (LOVENOX )    CrCl >/= 30 ml/min)  Weekly,   R     Comments: while on enoxaparin  therapy    05/20/24 2323            Signed, Chapman Rota, MD [facility] 05/27/2024

## 2024-05-28 DIAGNOSIS — J9601 Acute respiratory failure with hypoxia: Secondary | ICD-10-CM | POA: Diagnosis not present

## 2024-05-28 DIAGNOSIS — E785 Hyperlipidemia, unspecified: Secondary | ICD-10-CM | POA: Diagnosis not present

## 2024-05-28 DIAGNOSIS — R4182 Altered mental status, unspecified: Secondary | ICD-10-CM | POA: Diagnosis not present

## 2024-05-28 DIAGNOSIS — N400 Enlarged prostate without lower urinary tract symptoms: Secondary | ICD-10-CM | POA: Insufficient documentation

## 2024-05-28 DIAGNOSIS — I10 Essential (primary) hypertension: Secondary | ICD-10-CM | POA: Diagnosis not present

## 2024-05-28 DIAGNOSIS — Z7901 Long term (current) use of anticoagulants: Secondary | ICD-10-CM | POA: Diagnosis not present

## 2024-05-28 DIAGNOSIS — Z7401 Bed confinement status: Secondary | ICD-10-CM | POA: Diagnosis not present

## 2024-05-28 DIAGNOSIS — R531 Weakness: Secondary | ICD-10-CM | POA: Diagnosis not present

## 2024-05-28 DIAGNOSIS — I5032 Chronic diastolic (congestive) heart failure: Secondary | ICD-10-CM | POA: Diagnosis not present

## 2024-05-28 DIAGNOSIS — F32A Depression, unspecified: Secondary | ICD-10-CM | POA: Diagnosis not present

## 2024-05-28 DIAGNOSIS — R1310 Dysphagia, unspecified: Secondary | ICD-10-CM | POA: Diagnosis not present

## 2024-05-28 DIAGNOSIS — F419 Anxiety disorder, unspecified: Secondary | ICD-10-CM | POA: Diagnosis not present

## 2024-05-28 DIAGNOSIS — I350 Nonrheumatic aortic (valve) stenosis: Secondary | ICD-10-CM | POA: Diagnosis not present

## 2024-05-28 DIAGNOSIS — I639 Cerebral infarction, unspecified: Secondary | ICD-10-CM | POA: Diagnosis not present

## 2024-05-28 DIAGNOSIS — N182 Chronic kidney disease, stage 2 (mild): Secondary | ICD-10-CM | POA: Diagnosis not present

## 2024-05-28 DIAGNOSIS — N3000 Acute cystitis without hematuria: Secondary | ICD-10-CM | POA: Diagnosis not present

## 2024-05-28 DIAGNOSIS — N390 Urinary tract infection, site not specified: Secondary | ICD-10-CM | POA: Diagnosis not present

## 2024-05-28 DIAGNOSIS — K5900 Constipation, unspecified: Secondary | ICD-10-CM | POA: Diagnosis not present

## 2024-05-28 DIAGNOSIS — I69351 Hemiplegia and hemiparesis following cerebral infarction affecting right dominant side: Secondary | ICD-10-CM | POA: Diagnosis not present

## 2024-05-28 DIAGNOSIS — H04123 Dry eye syndrome of bilateral lacrimal glands: Secondary | ICD-10-CM | POA: Diagnosis not present

## 2024-05-28 DIAGNOSIS — J180 Bronchopneumonia, unspecified organism: Secondary | ICD-10-CM | POA: Diagnosis not present

## 2024-05-28 DIAGNOSIS — G9341 Metabolic encephalopathy: Secondary | ICD-10-CM | POA: Diagnosis not present

## 2024-05-28 DIAGNOSIS — N179 Acute kidney failure, unspecified: Secondary | ICD-10-CM | POA: Diagnosis not present

## 2024-05-28 DIAGNOSIS — K219 Gastro-esophageal reflux disease without esophagitis: Secondary | ICD-10-CM | POA: Diagnosis not present

## 2024-05-28 LAB — BASIC METABOLIC PANEL WITH GFR
Anion gap: 11 (ref 5–15)
BUN: 12 mg/dL (ref 8–23)
CO2: 24 mmol/L (ref 22–32)
Calcium: 8.2 mg/dL — ABNORMAL LOW (ref 8.9–10.3)
Chloride: 101 mmol/L (ref 98–111)
Creatinine, Ser: 1.01 mg/dL (ref 0.61–1.24)
GFR, Estimated: >60 mL/min (ref >60)
Glucose, Bld: 102 mg/dL — ABNORMAL HIGH (ref 70–99)
Potassium: 4 mmol/L (ref 3.5–5.1)
Sodium: 136 mmol/L (ref 135–145)

## 2024-05-28 LAB — CBC WITH DIFFERENTIAL/PLATELET
Abs Immature Granulocytes: 0.34 K/uL — ABNORMAL HIGH (ref 0.00–0.07)
Basophils Absolute: 0.1 K/uL (ref 0.0–0.1)
Basophils Relative: 1 %
Eosinophils Absolute: 0.6 K/uL — ABNORMAL HIGH (ref 0.0–0.5)
Eosinophils Relative: 5 %
HCT: 32.3 % — ABNORMAL LOW (ref 39.0–52.0)
Hemoglobin: 10.4 g/dL — ABNORMAL LOW (ref 13.0–17.0)
Immature Granulocytes: 3 %
Lymphocytes Relative: 17 %
Lymphs Abs: 2.1 K/uL (ref 0.7–4.0)
MCH: 29 pg (ref 26.0–34.0)
MCHC: 32.2 g/dL (ref 30.0–36.0)
MCV: 90 fL (ref 80.0–100.0)
Monocytes Absolute: 0.9 K/uL (ref 0.1–1.0)
Monocytes Relative: 8 %
Neutro Abs: 8.1 K/uL — ABNORMAL HIGH (ref 1.7–7.7)
Neutrophils Relative %: 66 %
Platelets: 326 K/uL (ref 150–400)
RBC: 3.59 MIL/uL — ABNORMAL LOW (ref 4.22–5.81)
RDW: 14.8 % (ref 11.5–15.5)
WBC: 12.2 K/uL — ABNORMAL HIGH (ref 4.0–10.5)
nRBC: 0 % (ref 0.0–0.2)

## 2024-05-28 LAB — CULTURE, BLOOD (ROUTINE X 2)
Culture: NO GROWTH
Culture: NO GROWTH

## 2024-05-28 MED ORDER — VITAMIN B-1 100 MG PO TABS: 100.0000 mg | ORAL_TABLET | Freq: Every day | ORAL | Status: AC

## 2024-05-28 MED ORDER — SENNOSIDES-DOCUSATE SODIUM 8.6-50 MG PO TABS: 1.0000 | ORAL_TABLET | Freq: Two times a day (BID) | ORAL | Status: AC

## 2024-05-28 MED ORDER — BOOST PLUS PO LIQD: 237.0000 mL | Freq: Two times a day (BID) | ORAL | Status: AC

## 2024-05-28 MED ORDER — POLYETHYLENE GLYCOL 3350 17 G PO PACK: 17.0000 g | PACK | Freq: Every day | ORAL | Status: AC | PRN

## 2024-05-28 MED ORDER — AMOXICILLIN-POT CLAVULANATE 875-125 MG PO TABS: 1.0000 | ORAL_TABLET | Freq: Two times a day (BID) | ORAL | Status: AC

## 2024-05-28 MED ORDER — HYDROXYZINE HCL 25 MG PO TABS: 25.0000 mg | ORAL_TABLET | Freq: Three times a day (TID) | ORAL | Status: AC | PRN

## 2024-05-28 NOTE — TOC Transition Note (Signed)
 Transition of Care [facility]) - Discharge Note   Patient Details  Name: Clarence Myers MRN: [id number] Date of Birth: [birth year]  [facility] CM/SW Contact:  Jeoffrey LITTIE Moose, [place] Phone Number: 05/28/2024, 12:11 PM   Clinical Narrative:    Patient will DC to: [name] Anticipated DC date: 05/28/24 Family notified: Yes Transport by: [facility]   Per MD patient ready for DC to [name]. RN to call report prior to discharge [phone] room 106. RN, patient, patient's family, and facility notified of DC. Discharge Summary and FL2 sent to facility. DC packet on chart. Ambulance transport requested for patient.   CSW will sign off for now as social work intervention is no longer needed. Please consult us  again if new needs arise.     Final next level of care: Skilled Nursing Facility Barriers to Discharge: Barriers Resolved   Patient Goals and CMS Choice Patient states their goals for this hospitalization and ongoing recovery are:: SNF          Discharge Placement   Existing PASRR number confirmed : 05/28/24          Patient chooses bed at: [facility] [place] Patient to be transferred to facility by: [facility] Name of family member notified: [name] Patient and family notified of of transfer: 05/28/24  Discharge Plan and Services Additional resources added to the After Visit Summary for                                       Social Drivers of Health (SDOH) Interventions SDOH Screenings   Food Insecurity: No Food Insecurity (05/21/2024)  Housing: Low Risk  (05/21/2024)  Transportation Needs: No Transportation Needs (05/21/2024)  Utilities: Not At Risk (05/21/2024)  Social Connections: Socially Isolated (05/21/2024)  Tobacco Use: Low Risk  (05/20/2024)     Readmission Risk Interventions    01/13/2024   11:53 AM  Readmission Risk Prevention Plan  Transportation Screening Complete  PCP or Specialist Appt within 5-7 Days Complete  Home Care  Screening Complete  Medication Review (RN CM) Complete

## 2024-05-28 NOTE — Discharge Summary (Signed)
 Physician Discharge Summary  Clarence Myers FMW:[id number] DOB: 24-Dec-1933 DOA: 05/20/2024  PCP: Loreli Kins, MD  Admit date: 05/20/2024 Discharge date: 05/28/2024  Admitted from: Home Discharge disposition: SNF  Recommendations at discharge:  Complete the course of antibiotics with 3 more days of oral Augmentin  Currently on dysphagia 3 diet.  Follow-up with the speech therapist. Continue bowel regimen with Senokot twice daily, MiraLAX  daily Follow-up with urology as an outpatient for prostatomegaly  Subjective: Patient was seen and examined this morning. Propped up in bed.  Not in distress.  Daughter at bedside. Remains afebrile, hemodynamically stable.  On 2 L oxygen   Brief narrative: Clarence Myers is a 89 y.o. male with PMH significant for HTN, HLD, CAD, CVA, low back pain, GERD, anxiety/depression 10/1, patient was brought to the ED by family for confusion, fever, dysuria, nausea, vomiting for few days.  Also has intermittent poor oral intake, generalized weakness.  In the ED, patient was septic with fever, low blood pressure Workup suggested UTI, pneumonia Admitted to [facility] Hospital course as below Seen by PT.  SNF recommended.  Pending placement currently  Hospital course: Sepsis POA Secondary UTI and pneumonia. See below for management of individual issues. Sepsis physiology improving with antibiotics. No longer having fever.  WBC count remains slightly elevated but improving.  Likely due to atelectasis. Recent Labs  Lab 05/23/24 0953 05/24/24 0217 05/26/24 1326 05/27/24 0457 05/28/24 0428  WBC 8.5 13.9* 12.1* 12.0* 12.2*   Aspiration pneumonia Chronic dysphagia Initial chest x-ray showed streaky opacities in the right lung base. Family reports patient having these choking episodes since 2 1/2 years.   In the hospital as well he was noted to have choking episodes.  Seen by speech therapist.  MBS done.  Currently on dysphagia 3 diet Initially treated  with IV Unasyn .  Switch to oral Augmentin .  Continue for next 3 days at discharge On as needed oxygen  at home.  Currently on low-flow oxygen .  Expected improvement once mobility improves.  UTI Urinalysis showed turbid yellow urine with moderate hemoglobin, moderate leukocytes, rare bacteria Treated with antibiotics  Staph epidermidis in blood culture Likely contamination.  Repeat cultures negative   Acute metabolic encephalopathy Secondary to sepsis, UTI, pneumonia MRI brain negative for acute stroke. Mental status initially worsened in the hospital and improved after cefepime  was changed to Unasyn . Mental status is much better now.  He is alert and conversational He came to know that his wife passed away in the last week of September and is grieving.  PTA meds- Zoloft  50 mg nightly, Remeron  15 mg nightly Currently Remeron  is on hold.  Continued on Zoloft  50 mg nightly, hydroxyzine as needed   AKI From sepsis and hypotension.  Creatinine improved to normal.  Recent Labs    02/03/24 0440 02/04/24 0536 05/20/24 1719 05/21/24 0035 05/21/24 0903 05/23/24 0953 05/24/24 0217 05/25/24 0642 05/26/24 0559 05/27/24 0457 05/28/24 0428  BUN 20 25* 38*  --  22 14 15 14 11 10 12   CREATININE 0.92 0.88 1.40* 1.25* 0.79 1.00 1.12 1.02 0.96 0.92 1.01  CO2 24 22 22   --  16* 24 25 25 26 24 24    Hypokalemia Improved with replacement Recent Labs  Lab 05/23/24 0309 05/23/24 0953 05/24/24 0217 05/25/24 0642 05/26/24 0559 05/26/24 1326 05/27/24 0457 05/28/24 0428  K  --    < > 3.4* 3.3* 3.3*  --  3.7 4.0  MG 1.7  --   --   --   --  1.8  --   --    < > =  values in this interval not displayed.   H/o CVA, HLD Aortic and coronary artery atherosclerosis PTA meds- Plavix  75 mg daily, Crestor  20 mg daily Continue both  Hypertension PTA meds- Toprol  12.5 mg daily Currently continued on Toprol  12.5 mg daily,   GERD  Stable   Constipation 10/4, abdomen x-ray showed gastric gaseous  distention with few prominent loops of air-filled small bowel in the right abdomen favor ileus.  Moderate colonic stool burden and distention of rectum with stool. 10/4, CT abdomen ruled out bowel obstruction. Continue bowel regimen with Senokot twice daily, MiraLAX  daily  BPH 10/4, CT abdomen showed prostatomegaly with mild bladder thickening probably due to muscular hypertrophy and chronic bladder outlet problem. Follow-up with urology as an outpatient  Generalized weakness Impaired mobility PT eval obtained.  SNF recommended  Goals of care   Code Status: Full Code   Diet:  Diet Order             Diet general           DIET DYS 3 Room service appropriate? Yes with Assist; Fluid consistency: Thin  Diet effective now                   Nutritional status:  Body mass index is 21.13 kg/m.  Nutrition Problem: Severe Malnutrition Etiology: chronic illness Signs/Symptoms: severe muscle depletion, severe fat depletion, energy intake < or equal to 75% for > or equal to 1 month, meal completion < 25%, percent weight loss (Pt has had a 10 lb wt loss, 7%, in 6 months.) Percent weight loss: 7 %  Wounds:  -    Discharge Medications:   Allergies as of 05/28/2024       Reactions   Bee Venom Anaphylaxis, Swelling   Blue Crab (cavinectes Sapidus) Allergy Skin Test Hives, Swelling, Other (See Comments), Cough   Shellfish Protein-containing Drug Products Hives        Medication List     STOP taking these medications    mirtazapine  15 MG tablet Commonly known as: REMERON        TAKE these medications    acetaminophen  325 MG tablet Commonly known as: TYLENOL  Take 2 tablets (650 mg total) by mouth every 4 (four) hours as needed for mild pain (or temp > 37.5 C (99.5 F)).   amoxicillin -clavulanate 875-125 MG tablet Commonly known as: AUGMENTIN  Take 1 tablet by mouth every 12 (twelve) hours for 3 days.   clopidogrel  75 MG tablet Commonly known as: PLAVIX  Take 1  tablet (75 mg total) by mouth daily. RESUME ONLY AFTER COMPLETION OF 30 DAYS OF ASPIRIN  AND BRILINTA  What changed: additional instructions   FISH OIL PO Take 1,000 mg by mouth in the morning and at bedtime.   hydrOXYzine 25 MG tablet Commonly known as: ATARAX Take 1 tablet (25 mg total) by mouth 3 (three) times daily as needed for anxiety.   lactose free nutrition Liqd Take 237 mLs by mouth 2 (two) times daily between meals.   latanoprost  0.005 % ophthalmic solution Commonly known as: XALATAN  Place 1 drop into both eyes at bedtime.   metoprolol  succinate 25 MG 24 hr tablet Commonly known as: Toprol  XL Take 0.5 tablets (12.5 mg total) by mouth daily.   MULTI-VITAMIN PO Take 30 mLs by mouth daily with breakfast.   multivitamin-lutein  Caps capsule Take 1 capsule by mouth daily.   omeprazole 20 MG capsule Commonly known as: PRILOSEC Take 20 mg by mouth daily before breakfast.   polyethylene glycol 17  g packet Commonly known as: MIRALAX  / GLYCOLAX  Take 17 g by mouth daily as needed.   PROBIOTIC DAILY PO Take 1 capsule by mouth daily.   Restasis  0.05 % ophthalmic emulsion Generic drug: cycloSPORINE  Place 1 drop into both eyes 2 (two) times daily.   rosuvastatin  20 MG tablet Commonly known as: CRESTOR  Take 1 tablet (20 mg total) by mouth daily. What changed: when to take this   senna-docusate 8.6-50 MG tablet Commonly known as: Senokot-S Take 1 tablet by mouth 2 (two) times daily.   sertraline  50 MG tablet Commonly known as: ZOLOFT  Take 50 mg by mouth at bedtime.   thiamine 100 MG tablet Commonly known as: Vitamin B-1 Take 1 tablet (100 mg total) by mouth daily. Start taking on: May 29, 2024         Follow ups:    Contact information for follow-up providers     Loreli Kins, MD Follow up.   Specialty: Family Medicine Contact information: [address] [place] [address] [phone]              Contact information  for after-discharge care     Destination     [name], [place] .   Service: Skilled Nursing Contact information: [address] [place]  [phone]                     Discharge Instructions:   Discharge Instructions     Ambulatory referral to Urology   Complete by: As directed    CT abdomen with prostatomegaly, chronic bladder outlet obstruction   Call MD for:  difficulty breathing, headache or visual disturbances   Complete by: As directed    Call MD for:  extreme fatigue   Complete by: As directed    Call MD for:  hives   Complete by: As directed    Call MD for:  persistant dizziness or light-headedness   Complete by: As directed    Call MD for:  persistant nausea and vomiting   Complete by: As directed    Call MD for:  severe uncontrolled pain   Complete by: As directed    Call MD for:  temperature >100.4   Complete by: As directed    Diet general   Complete by: As directed    Dysphagia 3 diet currently   Discharge instructions   Complete by: As directed    Recommendations at discharge:   Complete the course of antibiotics with 3 more days of oral Augmentin   Currently on dysphagia 3 diet.  Follow-up with the speech therapist.  Continue bowel regimen with Senokot twice daily, MiraLAX  daily  Follow-up with urology as an outpatient for prostatomegaly  General discharge instructions: Follow with Primary MD Loreli Kins, MD in 7 days  Please request your PCP  to go over your hospital tests, procedures, radiology results at the follow up. Please get your medicines reviewed and adjusted.  Your PCP may decide to repeat certain labs or tests as needed. Do not drive, operate heavy machinery, perform activities at heights, swimming or participation in water activities or provide baby sitting services if your were admitted for syncope or siezures until you have seen by Primary MD or a Neurologist and advised to do  so again. [place]  Controlled Substance Reporting System database was reviewed. Do not drive, operate heavy machinery, perform activities at heights, swim, participate in water activities or provide baby-sitting services while on medications for  pain, sleep and mood until your outpatient physician has reevaluated you and advised to do so again.  You are strongly recommended to comply with the dose, frequency and duration of prescribed medications. Activity: As tolerated with Full fall precautions use walker/cane & assistance as needed Avoid using any recreational substances like cigarette, tobacco, alcohol, or non-prescribed drug. If you experience worsening of your admission symptoms, develop shortness of breath, life threatening emergency, suicidal or homicidal thoughts you must seek medical attention immediately by calling 911 or calling your MD immediately  if symptoms less severe. You must read complete instructions/literature along with all the possible adverse reactions/side effects for all the medicines you take and that have been prescribed to you. Take any new medicine only after you have completely understood and accepted all the possible adverse reactions/side effects.  Wear Seat belts while driving. You were cared for by a hospitalist during your hospital stay. If you have any questions about your discharge medications or the care you received while you were in the hospital after you are discharged, you can call the unit and ask to speak with the hospitalist or the covering physician. Once you are discharged, your primary care physician will handle any further medical issues. Please note that NO REFILLS for any discharge medications will be authorized once you are discharged, as it is imperative that you return to your primary care physician (or establish a relationship with a primary care physician if you do not have one).   Increase activity slowly   Complete by: As directed         Discharge Exam:   Vitals:   05/27/24 2203 05/28/24 0536 05/28/24 0900 05/28/24 0922  BP: (!) 159/72 (!) 142/64 120/70 120/70  Pulse: 64 71 79 77  Resp: 16 16 16    Temp: 98.2 F (36.8 C) 98.3 F (36.8 C) 98.3 F (36.8 C) 98.3 F (36.8 C)  TempSrc: Oral Oral Oral Oral  SpO2: 94% 94% 96% 96%  Weight:      Height:        Body mass index is 21.13 kg/m.  General exam: Pleasant, elderly Caucasian male Skin: No rashes, lesions or ulcers. HEENT: Atraumatic, normocephalic, no obvious bleeding Lungs: Diminished air entry in both bases.  Otherwise clear to auscultation bilaterally. CVS: S1, S2, no murmur,   GI/Abd: Soft, nontender, nondistended, bowel sound present,   CNS: Alert, awake, oriented X 3 Psychiatry: Mood appropriate Extremities: No pedal edema, no calf tenderness,    The results of significant diagnostics from this hospitalization (including imaging, microbiology, ancillary and laboratory) are listed below for reference.    Procedures and Diagnostic Studies:   DG Chest Portable 1 View Result Date: 05/20/2024 CLINICAL DATA:  confusion  4 days elevated temp today blood urine EXAM: PORTABLE CHEST - 1 VIEW COMPARISON:  05/02/2024 FINDINGS: Streaky opacities in the right lung base. No pleural effusion or pneumothorax. No cardiomegaly. Aortic atherosclerosis. No acute fracture or destructive lesions. Multilevel thoracic osteophytosis. IMPRESSION: Streaky opacities in the right lung base, likely atelectasis. Alternatively, this could reflect a developing bronchopneumonia, in the correct clinical context. Electronically Signed   By: Rogelia Myers M.D.   On: 05/20/2024 18:49     Labs:   Basic Metabolic Panel: Recent Labs  Lab 05/23/24 0309 05/23/24 0953 05/24/24 0217 05/25/24 9357 05/26/24 0559 05/26/24 1326 05/27/24 0457 05/28/24 0428  NA  --    < > 137 137 139  --  137 136  K  --    < >  3.4* 3.3* 3.3*  --  3.7 4.0  CL  --    < > 102 102 103  --  101 101  CO2   --    < > 25 25 26   --  24 24  GLUCOSE  --    < > 100* 93 104*  --  98 102*  BUN  --    < > 15 14 11   --  10 12  CREATININE  --    < > 1.12 1.02 0.96  --  0.92 1.01  CALCIUM   --    < > 8.0* 7.8* 8.0*  --  8.0* 8.2*  MG 1.7  --   --   --   --  1.8  --   --    < > = values in this interval not displayed.   GFR Estimated Creatinine Clearance: 43.8 mL/min (by C-G formula based on SCr of 1.01 mg/dL). Liver Function Tests: No results for input(s): AST, ALT, ALKPHOS, BILITOT, PROT, ALBUMIN in the last 168 hours. No results for input(s): LIPASE, AMYLASE in the last 168 hours. No results for input(s): AMMONIA in the last 168 hours. Coagulation profile No results for input(s): INR, PROTIME in the last 168 hours.  CBC: Recent Labs  Lab 05/23/24 0953 05/24/24 0217 05/26/24 1326 05/27/24 0457 05/28/24 0428  WBC 8.5 13.9* 12.1* 12.0* 12.2*  NEUTROABS 6.5 10.6* 7.9* 7.9* 8.1*  HGB 11.3* 11.3* 10.5* 10.9* 10.4*  HCT 34.7* 34.4* 32.0* 32.9* 32.3*  MCV 90.6 91.0 89.4 89.2 90.0  PLT 206 226 280 303 326   Cardiac Enzymes: No results for input(s): CKTOTAL, CKMB, CKMBINDEX, TROPONINI in the last 168 hours. BNP: Invalid input(s): POCBNP CBG: No results for input(s): GLUCAP in the last 168 hours. D-Dimer No results for input(s): DDIMER in the last 72 hours. Hgb A1c No results for input(s): HGBA1C in the last 72 hours. Lipid Profile No results for input(s): CHOL, HDL, LDLCALC, TRIG, CHOLHDL, LDLDIRECT in the last 72 hours. Thyroid  function studies No results for input(s): TSH, T4TOTAL, T3FREE, THYROIDAB in the last 72 hours.  Invalid input(s): FREET3 Anemia work up No results for input(s): VITAMINB12, FOLATE, FERRITIN, TIBC, IRON, RETICCTPCT in the last 72 hours. Microbiology Recent Results (from the past 240 hours)  Blood Culture (routine x 2)     Status: None   Collection Time: 05/20/24  6:09 PM   Specimen: BLOOD   Result Value Ref Range Status   Specimen Description BLOOD SITE NOT SPECIFIED  Final   Special Requests   Final    BOTTLES DRAWN AEROBIC AND ANAEROBIC Blood Culture results may not be optimal due to an inadequate volume of blood received in culture bottles   Culture   Final    NO GROWTH 5 DAYS Performed at [facility], [address], [place], [place] [address]    Report Status 05/25/2024 FINAL  Final  Resp panel by RT-PCR (RSV, Flu A&B, Covid) Urine, Clean Catch     Status: None   Collection Time: 05/20/24  6:09 PM   Specimen: Urine, Clean Catch; Nasal Swab  Result Value Ref Range Status   SARS Coronavirus 2 by RT PCR NEGATIVE NEGATIVE Final   Influenza A by PCR NEGATIVE NEGATIVE Final   Influenza B by PCR NEGATIVE NEGATIVE Final    Comment: (NOTE) The Xpert Xpress SARS-CoV-2/FLU/RSV plus assay is intended as an aid in the diagnosis of influenza from Nasopharyngeal swab specimens and should not be used as a sole  basis for treatment. Nasal washings and aspirates are unacceptable for Xpert Xpress SARS-CoV-2/FLU/RSV testing.  Fact Sheet for Patients: BloggerCourse.com  Fact Sheet for Healthcare Providers: SeriousBroker.it  This test is not yet approved or cleared by the [place]  FDA and has been authorized for detection and/or diagnosis of SARS-CoV-2 by FDA under an Emergency Use Authorization (EUA). This EUA will remain in effect (meaning this test can be used) for the duration of the COVID-19 declaration under Section 564(b)(1) of the Act, 21 U.S.C. section 360bbb-3(b)(1), unless the authorization is terminated or revoked.     Resp Syncytial Virus by PCR NEGATIVE NEGATIVE Final    Comment: (NOTE) Fact Sheet for Patients: BloggerCourse.com  Fact Sheet for Healthcare Providers: SeriousBroker.it  This test is not yet approved or cleared by the [place]  FDA  and has been authorized for detection and/or diagnosis of SARS-CoV-2 by FDA under an Emergency Use Authorization (EUA). This EUA will remain in effect (meaning this test can be used) for the duration of the COVID-19 declaration under Section 564(b)(1) of the Act, 21 U.S.C. section 360bbb-3(b)(1), unless the authorization is terminated or revoked.  Performed at [facility], [address], [place], [place] [address]   Blood Culture (routine x 2)     Status: Abnormal   Collection Time: 05/20/24  7:43 PM   Specimen: BLOOD RIGHT ARM  Result Value Ref Range Status   Specimen Description BLOOD RIGHT ARM  Final   Special Requests   Final    BOTTLES DRAWN AEROBIC AND ANAEROBIC Blood Culture adequate volume   Culture  Setup Time   Final    GRAM POSITIVE COCCI IN CLUSTERS BOTTLES DRAWN AEROBIC ONLY CRITICAL RESULT CALLED TO, READ BACK BY AND VERIFIED WITH: PHARMD G ABBOTT 05/22/2024 @ 0107 BY AB    Culture (A)  Final    STAPHYLOCOCCUS EPIDERMIDIS THE SIGNIFICANCE OF ISOLATING THIS ORGANISM FROM A SINGLE SET OF BLOOD CULTURES WHEN MULTIPLE SETS ARE DRAWN IS UNCERTAIN. PLEASE NOTIFY THE MICROBIOLOGY DEPARTMENT WITHIN ONE WEEK IF SPECIATION AND SENSITIVITIES ARE REQUIRED. Performed at [facility], [address] [place], [place] [address]    Report Status 05/23/2024 FINAL  Final  Blood Culture ID Panel (Reflexed)     Status: Abnormal   Collection Time: 05/20/24  7:43 PM  Result Value Ref Range Status   Enterococcus faecalis NOT DETECTED NOT DETECTED Final   Enterococcus Faecium NOT DETECTED NOT DETECTED Final   Listeria monocytogenes NOT DETECTED NOT DETECTED Final   Staphylococcus species DETECTED (A) NOT DETECTED Final    Comment: CRITICAL RESULT CALLED TO, READ BACK BY AND VERIFIED WITH: PHARMD G ABBOTT 05/22/2024 @ 0107 BY AB    Staphylococcus aureus (BCID) NOT DETECTED NOT DETECTED Final   Staphylococcus epidermidis DETECTED (A) NOT DETECTED Final    Comment:  Methicillin (oxacillin) resistant coagulase negative staphylococcus. Possible blood culture contaminant (unless isolated from more than one blood culture draw or clinical case suggests pathogenicity). No antibiotic treatment is indicated for blood  culture contaminants. CRITICAL RESULT CALLED TO, READ BACK BY AND VERIFIED WITH: PHARMD G ABBOTT 05/22/2024 @ 0107 BY AB    Staphylococcus lugdunensis NOT DETECTED NOT DETECTED Final   Streptococcus species NOT DETECTED NOT DETECTED Final   Streptococcus agalactiae NOT DETECTED NOT DETECTED Final   Streptococcus pneumoniae NOT DETECTED NOT DETECTED Final   Streptococcus pyogenes NOT DETECTED NOT DETECTED Final   A.calcoaceticus-baumannii NOT DETECTED NOT DETECTED Final   Bacteroides fragilis NOT DETECTED NOT DETECTED Final  Enterobacterales NOT DETECTED NOT DETECTED Final   Enterobacter cloacae complex NOT DETECTED NOT DETECTED Final   Escherichia coli NOT DETECTED NOT DETECTED Final   Klebsiella aerogenes NOT DETECTED NOT DETECTED Final   Klebsiella oxytoca NOT DETECTED NOT DETECTED Final   Klebsiella pneumoniae NOT DETECTED NOT DETECTED Final   Proteus species NOT DETECTED NOT DETECTED Final   Salmonella species NOT DETECTED NOT DETECTED Final   Serratia marcescens NOT DETECTED NOT DETECTED Final   Haemophilus influenzae NOT DETECTED NOT DETECTED Final   Neisseria meningitidis NOT DETECTED NOT DETECTED Final   Pseudomonas aeruginosa NOT DETECTED NOT DETECTED Final   Stenotrophomonas maltophilia NOT DETECTED NOT DETECTED Final   Candida albicans NOT DETECTED NOT DETECTED Final   Candida auris NOT DETECTED NOT DETECTED Final   Candida glabrata NOT DETECTED NOT DETECTED Final   Candida krusei NOT DETECTED NOT DETECTED Final   Candida parapsilosis NOT DETECTED NOT DETECTED Final   Candida tropicalis NOT DETECTED NOT DETECTED Final   Cryptococcus neoformans/gattii NOT DETECTED NOT DETECTED Final   Methicillin resistance mecA/C DETECTED (A)  NOT DETECTED Final    Comment: CRITICAL RESULT CALLED TO, READ BACK BY AND VERIFIED WITH: PHARMD G ABBOTT 05/22/2024 @ 0107 BY AB Performed at [facility], [address], [place], [place] [address]   Culture, blood (Routine X 2) w Reflex to ID Panel     Status: None   Collection Time: 05/23/24  1:37 PM   Specimen: BLOOD RIGHT ARM  Result Value Ref Range Status   Specimen Description BLOOD RIGHT ARM  Final   Special Requests   Final    BOTTLES DRAWN AEROBIC AND ANAEROBIC Blood Culture results may not be optimal due to an inadequate volume of blood received in culture bottles   Culture   Final    NO GROWTH 5 DAYS Performed at [facility], [address], [place], [place] [address]    Report Status 05/28/2024 FINAL  Final  Culture, blood (Routine X 2) w Reflex to ID Panel     Status: None   Collection Time: 05/23/24  1:41 PM   Specimen: BLOOD LEFT HAND  Result Value Ref Range Status   Specimen Description BLOOD LEFT HAND  Final   Special Requests   Final    BOTTLES DRAWN AEROBIC AND ANAEROBIC Blood Culture results may not be optimal due to an inadequate volume of blood received in culture bottles   Culture   Final    NO GROWTH 5 DAYS Performed at [facility], [address], [place], [place] [address]    Report Status 05/28/2024 FINAL  Final    Time coordinating discharge: 45 minutes  Signed: Liberato Stansbery  [facility] 05/28/2024, 11:47 AM

## 2024-05-28 NOTE — TOC Progression Note (Signed)
 Transition of Care [facility]) - Progression Note    Patient Details  Name: Clarence Myers MRN: [id number] Date of Birth: [birth year]  Transition of [facility]) CM/SW Contact  Memori Sammon LITTIE [place], [place] Phone Number: 05/28/2024, 8:44 AM  Clinical Narrative:    CSW received auth approval for [name] ID# [id number]. Effective through 10/14. CSW confirmed bed availability with facility, pt can admit when medically ready for DC.    Expected Discharge Plan: Skilled Nursing Facility Barriers to Discharge: English as a second language teacher, Continued Medical Work up, SNF Pending bed offer               Expected Discharge Plan and Services       Living arrangements for the past 2 months: Single Family Home                                       Social Drivers of Health (SDOH) Interventions SDOH Screenings   Food Insecurity: No Food Insecurity (05/21/2024)  Housing: Low Risk  (05/21/2024)  Transportation Needs: No Transportation Needs (05/21/2024)  Utilities: Not At Risk (05/21/2024)  Social Connections: Socially Isolated (05/21/2024)  Tobacco Use: Low Risk  (05/20/2024)    Readmission Risk Interventions    01/13/2024   11:53 AM  Readmission Risk Prevention Plan  Transportation Screening Complete  PCP or Specialist Appt within 5-7 Days Complete  Home Care Screening Complete  Medication Review (RN CM) Complete

## 2024-05-29 DIAGNOSIS — E785 Hyperlipidemia, unspecified: Secondary | ICD-10-CM | POA: Diagnosis not present

## 2024-05-29 DIAGNOSIS — I10 Essential (primary) hypertension: Secondary | ICD-10-CM | POA: Diagnosis not present

## 2024-05-29 DIAGNOSIS — I639 Cerebral infarction, unspecified: Secondary | ICD-10-CM | POA: Diagnosis not present

## 2024-05-29 DIAGNOSIS — I350 Nonrheumatic aortic (valve) stenosis: Secondary | ICD-10-CM | POA: Diagnosis not present

## 2024-06-11 DIAGNOSIS — G9341 Metabolic encephalopathy: Secondary | ICD-10-CM | POA: Diagnosis not present

## 2024-06-11 DIAGNOSIS — I5032 Chronic diastolic (congestive) heart failure: Secondary | ICD-10-CM | POA: Diagnosis not present

## 2024-06-11 DIAGNOSIS — N179 Acute kidney failure, unspecified: Secondary | ICD-10-CM | POA: Diagnosis not present

## 2024-06-11 DIAGNOSIS — J690 Pneumonitis due to inhalation of food and vomit: Secondary | ICD-10-CM | POA: Diagnosis not present

## 2024-06-11 DIAGNOSIS — I130 Hypertensive heart and chronic kidney disease with heart failure and stage 1 through stage 4 chronic kidney disease, or unspecified chronic kidney disease: Secondary | ICD-10-CM | POA: Diagnosis not present

## 2024-06-11 DIAGNOSIS — N182 Chronic kidney disease, stage 2 (mild): Secondary | ICD-10-CM | POA: Diagnosis not present

## 2024-06-11 DIAGNOSIS — B957 Other staphylococcus as the cause of diseases classified elsewhere: Secondary | ICD-10-CM | POA: Diagnosis not present

## 2024-06-11 DIAGNOSIS — A419 Sepsis, unspecified organism: Secondary | ICD-10-CM | POA: Diagnosis not present

## 2024-06-11 DIAGNOSIS — I2510 Atherosclerotic heart disease of native coronary artery without angina pectoris: Secondary | ICD-10-CM | POA: Diagnosis not present

## 2024-06-11 DIAGNOSIS — N390 Urinary tract infection, site not specified: Secondary | ICD-10-CM | POA: Diagnosis not present

## 2024-06-13 DIAGNOSIS — J9601 Acute respiratory failure with hypoxia: Secondary | ICD-10-CM | POA: Diagnosis not present

## 2024-06-13 DIAGNOSIS — I5032 Chronic diastolic (congestive) heart failure: Secondary | ICD-10-CM | POA: Diagnosis not present

## 2024-06-17 DIAGNOSIS — A419 Sepsis, unspecified organism: Secondary | ICD-10-CM | POA: Diagnosis not present

## 2024-06-17 DIAGNOSIS — I69351 Hemiplegia and hemiparesis following cerebral infarction affecting right dominant side: Secondary | ICD-10-CM | POA: Diagnosis not present

## 2024-06-17 DIAGNOSIS — Z23 Encounter for immunization: Secondary | ICD-10-CM | POA: Diagnosis not present

## 2024-06-17 DIAGNOSIS — N390 Urinary tract infection, site not specified: Secondary | ICD-10-CM | POA: Diagnosis not present

## 2024-06-17 DIAGNOSIS — J690 Pneumonitis due to inhalation of food and vomit: Secondary | ICD-10-CM | POA: Diagnosis not present

## 2024-06-17 DIAGNOSIS — R066 Hiccough: Secondary | ICD-10-CM | POA: Diagnosis not present

## 2024-07-06 DIAGNOSIS — I5032 Chronic diastolic (congestive) heart failure: Secondary | ICD-10-CM | POA: Diagnosis not present

## 2024-07-06 DIAGNOSIS — J9601 Acute respiratory failure with hypoxia: Secondary | ICD-10-CM | POA: Diagnosis not present

## 2024-07-14 DIAGNOSIS — J9601 Acute respiratory failure with hypoxia: Secondary | ICD-10-CM | POA: Diagnosis not present

## 2024-09-07 ENCOUNTER — Ambulatory Visit: Admitting: Neurology

## 2024-09-07 ENCOUNTER — Encounter: Payer: Self-pay | Admitting: Neurology

## 2024-09-07 VITALS — BP 139/85 | HR 76 | Temp 98.0°F | Ht 66.0 in

## 2024-09-07 DIAGNOSIS — Z8673 Personal history of transient ischemic attack (TIA), and cerebral infarction without residual deficits: Secondary | ICD-10-CM

## 2024-09-07 DIAGNOSIS — G3184 Mild cognitive impairment, so stated: Secondary | ICD-10-CM | POA: Diagnosis not present

## 2024-09-07 NOTE — Patient Instructions (Signed)
 I had a long d/w patient and his daughter about his remote strokes and mild cognitive impairment, risk for recurrent stroke/TIAs, personally independently reviewed imaging studies and stroke evaluation results and answered questions.Continue Plavix  75 mg daily for secondary stroke prevention and maintain strict control of hypertension with blood pressure goal below 130/90, diabetes with hemoglobin A1c goal below 6.5% and lipids with LDL cholesterol goal below 70 mg/dL. I also advised the patient to eat a healthy diet with plenty of whole grains, cereals, fruits and vegetables, exercise regularly and maintain ideal body weight .I also advised him to increase participation in cognitively challenging activities like following up with puzzles, playing bridge, word searches and sudoku.  We also discussed memory compensation strategies.  Followup in the future with Amy, nurse practitioner or call earlier if necessary.  Memory Compensation Strategies  Use WARM strategy.  W= write it down  A= associate it  R= repeat it  M= make a mental note  2.   You can keep a Glass Blower/designer.  Use a 3-ring notebook with sections for the following: calendar, important names and phone numbers,  medications, doctors' names/phone numbers, lists/reminders, and a section to journal what you did  each day.   3.    Use a calendar to write appointments down.  4.    Write yourself a schedule for the day.  This can be placed on the calendar or in a separate section of the Memory Notebook.  Keeping a  regular schedule can help memory.  5.    Use medication organizer with sections for each day or morning/evening pills.  You may need help loading it  6.    Keep a basket, or pegboard by the door.  Place items that you need to take out with you in the basket or on the pegboard.  You may also want to  include a message board for reminders.  7.    Use sticky notes.  Place sticky notes with reminders in a place where the task is  performed.  For example:  turn off the  stove placed by the stove, lock the door placed on the door at eye level,  take your medications on  the bathroom mirror or by the place where you normally take your medications.  8.    Use alarms/timers.  Use while cooking to remind yourself to check on food or as a reminder to take your medicine, or as a  reminder to make a call, or as a reminder to perform another task, etc.

## 2024-09-07 NOTE — Progress Notes (Signed)
 Hello yes 6:00 imaging okay so you may have [facility] [address] [place]. [place] [address] [phone]       HOSPITAL FOLLOW UP NOTE  Clarence. Clarence Myers Date of Birth:  Jul 17, 1934 Medical Record Number:  [id number]   Reason for Referral:  hospital stroke follow up    SUBJECTIVE:   CHIEF COMPLAINT:  Chief Complaint  Patient presents with   Follow-up    Room 17 With daughter MS    HPI:   Clarence Myers is a 89 y.o. who  has a past medical history of Acute ischemic stroke (HCC) (12/30/2021), CAD (coronary artery disease), CVA (cerebral vascular accident) (HCC) (09/29/2021), Hyperlipidemia, Hypokalemia (09/29/2021), Low back strain, Shoulder injury, and Stroke (HCC).  Patient presented on 12/29/2021 with acute diffuse weakness upon waking that morning. He was having a difficult time standing up. MRI showed 9mm focus of diffusion in posterior left frontal corona radiata, likely small evolving subacute ischemic infarct. He was admitted for previous CVA in 09/2021 with residual right upper ext weakness. He was discharged home on Plavix  at that time. With most recent CVA 12/29/2021, he was switched to Brillenta and asa continued. After 1 month he was to resume Plavix  and discontinue asa 81mg . PT/OT recommended outpatient therapy. Personally reviewed hospitalization pertinent progress notes, lab work and imaging.  Evaluated by Jerri.   Since being back home, he is doing fairly well. He did complete 30 day heart monitor. Per his daughter, no abnormal rhythm noted. He has follow up next month and will discuss possible loop recorder. He denies chest pain or shob. He continues to work with PT/OT. He feels that he is getting a little stronger as time passes. He does continues to have R>L weakness. He is ambulating with a walker. He did not bring walker with him, today. He feels that mood is ok. He is sleeping well. He continues Plavix . He is taking rosuvastatin  as prescribed.  BP is well managed on metoprolol  12.5mg  BID.   UPDATE 09/17/2022 ALL: Clarence Myers returns for follow up post CVA 09/2021 and 12/2021. He was last seen 02/2022 and doing fairly well. He was walking with a walker. He presents with his daughter, today, who assists with HPI. He seemed to be doing fairly well over the summer but had a fall about a month ago resulting in 2 broken ribs. Since, he has had more difficulty with lower ext weakness. He is working with PT in the home twice a week. He is able to walk short distances with walker. He is able to complete ADLs with minimal assistance. He is eating normally and sleeps well. May be sleeping a little more than normal. He denies concerns of depression. He has a caregiver that checks on him daily. He lives with his wife who is in poor health. He continues Plavix  and rosuvastatin . He has close follow up with PCP and cardiology.   UPDATE 03/19/2023 ALL: Clarence Myers returns for follow up post CVA 09/2021 and 12/2021. He was last seen 08/2022. He was working with PT following a fall resulting in two broken ribs. He was seen in ER at [facility] 10/2022 for concerns of aspiration pna. IV antibiotics given and sent home with oral meds. HH referral with ST placed. Follow up with pulmonology advised. Family does not remember hearing back from [facility] and has not seen pulmonology.   Since, he continues to have progressive weakness. He was able to ambulate with walker/cane but had another episode of  choking last week. He was seen by PCP. CXR clear. Swallowing precautions advised. He has had a total of three episodes of choking since 09/2021. Since event last week, he is mobilizing with wheelchair. He did not feel PT was very helpful but does continue HEP daily. He has a peddler he tries to use every day but admits that some days he just doesn't feel like it. He continues Plavix  and rosuvastatin . He continues close follow up with PCP and cardiology.   UPDATE 11/26/2023 ALL: Clarence Myers returns  for hospital follow up following new cerebellar stroke 10/07/2023. He presented to the ER with generalized weakness, confusion and an episode of choking. CT showed no acute findings. CTA head and neck showed right P1 occlusion, left V4 atherosclerosis with mild to moderate stenosis. MRI showed left small linear cerebellar infarct. Started on DAPT with asa and Plavix  for three months then asa alone. LDL 45. A1C 5.6. He continues rosuvastatin  20mg  daily. Previously taking Plavix . He was discharged home 10/08/23 with HH orders.   Since discharge, he reports doing fair. He can get around with walker if shorter distances but uses wheelchair if going out. He is working with [facility] PT. OT signed off. He has not had follow up with ST. No further choking spells. He eats a regular diet. No trouble swallowing. He continues Plavix  and asa 81mg  daily. He reports being told to continue Plavix  only after three months. He is having some bruising of bilateral arms. No usual bleeding. Tolerating rosuvastatin  20mg  daily without obvious joint pain. He has 24/7 care at home. His daughters and private caregiver rotate. He lives with his wife who has dementia and not well. He sleeps well. No snoring. No obvious signs of sleep apnea or family history.   He was last seen by cardiology 04/2023 for CAD and moderate aortic stenosis. Medical management continued. Advised 1 year follow up. He last saw Dr Loreli 11/05/2023.   Update 09/07/2024 : He returns for follow-up after last visit with nurse practitioner in April 2025.  Patient is living at home with son and daughter-in-law.  She has 24-hour caregiver support.  She was admitted in October 2025 with episode of confusion nausea in setting of dysuria.  She was diagnosed with UTI and pneumonia responded to antibiotics.  Patient has not had any stroke or TIA symptoms short-term memory difficulties and gets disoriented and confused occasionally.  He has noticed some decreased vision the last few  months due to his glaucoma.  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  He remains on Plavix  which he is tolerating well with minor bruising and no bleeding.  He is tolerating Crestor  well without side effects.  His blood pressure is under good control.  MRI scan of the brain on 05/22/2024 showed no acute abnormalities neurology.  Carotid ultrasound on 12/21/2023 showed no significant extracranial disease.  He is able to ambulate with a walker but requires a caregiver at his side.  He has had no falls or injuries.  Help with activities like bathing and dressing himself.  Behavior is calm and without any delusions, hallucinations or agitation.  A1C Lab Results  Component Value Date   HGBA1C 5.6 10/08/2023    Lipid Panel     Component Value Date/Time   CHOL 101 10/08/2023 0430   CHOL 156 03/21/2022 1423   TRIG 42 10/08/2023 0430   HDL 48 10/08/2023 0430   HDL 46 03/21/2022 1423   CHOLHDL 2.1 10/08/2023 0430  VLDL 8 10/08/2023 0430   LDLCALC 45 10/08/2023 0430   LDLCALC 77 03/21/2022 1423   LABVLDL 33 03/21/2022 1423    ROS:   14 system review of systems performed and negative with exception of those listed in HPI  PMH:  Past Medical History:  Diagnosis Date   Acute ischemic stroke (HCC) 12/30/2021   CAD (coronary artery disease)    CVA (cerebral vascular accident) (HCC) 09/29/2021   Hyperlipidemia    Hypokalemia 09/29/2021   Low back strain    Shoulder injury    Fell and injured Right shoulder   Stroke (HCC)     PSH:  Past Surgical History:  Procedure Laterality Date   ANGIOPLASTY     1992-LAD   CARDIAC CATHETERIZATION      Social History:  Social History   Socioeconomic History   Marital status: Married    Spouse name: Not on file   Number of children: Not on file   Years of education: Not on file   Highest education level: Not on file  Occupational History   Not on file  Tobacco Use   Smoking status:  Never   Smokeless tobacco: Never  Vaping Use   Vaping status: Never Used  Substance and Sexual Activity   Alcohol use: Never   Drug use: Never   Sexual activity: Not on file  Other Topics Concern   Not on file  Social History Narrative   Not on file   Social Drivers of Health   Tobacco Use: Low Risk (09/07/2024)   Patient History    Smoking Tobacco Use: Never    Smokeless Tobacco Use: Never    Passive Exposure: Not on file  Financial Resource Strain: Not on file  Food Insecurity: No Food Insecurity (05/21/2024)   Epic    Worried About Programme Researcher, Broadcasting/film/video in the Last Year: Never true    Ran Out of Food in the Last Year: Never true  Transportation Needs: No Transportation Needs (05/21/2024)   Epic    Lack of Transportation (Medical): No    Lack of Transportation (Non-Medical): No  Physical Activity: Not on file  Stress: Not on file  Social Connections: Socially Isolated (05/21/2024)   Social Connection and Isolation Panel    Frequency of Communication with Friends and Family: More than three times a week    Frequency of Social Gatherings with Friends and Family: More than three times a week    Attends Religious Services: Never    Database Administrator or Organizations: No    Attends Banker Meetings: Never    Marital Status: Widowed  Intimate Partner Violence: Not At Risk (05/21/2024)   Epic    Fear of Current or Ex-Partner: No    Emotionally Abused: No    Physically Abused: No    Sexually Abused: No  Depression (PHQ2-9): Not on file  Alcohol Screen: Not on file  Housing: Low Risk (05/21/2024)   Epic    Unable to Pay for Housing in the Last Year: No    Number of Times Moved in the Last Year: 0    Homeless in the Last Year: No  Utilities: Not At Risk (05/21/2024)   Epic    Threatened with loss of utilities: No  Health Literacy: Not on file    Family History: History reviewed. No pertinent family history.  Medications:   Current Outpatient Medications  on File Prior to Visit  Medication Sig Dispense Refill   acetaminophen  (TYLENOL ) 325  MG tablet Take 2 tablets (650 mg total) by mouth every 4 (four) hours as needed for mild pain (or temp > 37.5 C (99.5 F)).     clopidogrel  (PLAVIX ) 75 MG tablet Take 1 tablet (75 mg total) by mouth daily. RESUME ONLY AFTER COMPLETION OF 30 DAYS OF ASPIRIN  AND BRILINTA  (Patient taking differently: Take 75 mg by mouth daily.)     lactose free nutrition (BOOST PLUS) LIQD Take 237 mLs by mouth 2 (two) times daily between meals.     latanoprost  (XALATAN ) 0.005 % ophthalmic solution Place 1 drop into both eyes at bedtime. 2.5 mL 12   metoprolol  succinate (TOPROL  XL) 25 MG 24 hr tablet Take 0.5 tablets (12.5 mg total) by mouth daily. 30 tablet 3   Multiple Vitamin (MULTI-VITAMIN PO) Take 30 mLs by mouth daily with breakfast.     multivitamin-lutein  (OCUVITE-LUTEIN ) CAPS Take 1 capsule by mouth daily.       Omega-3 Fatty Acids (FISH OIL PO) Take 1,000 mg by mouth in the morning and at bedtime.     omeprazole (PRILOSEC) 20 MG capsule Take 20 mg by mouth daily before breakfast.     polyethylene glycol (MIRALAX  / GLYCOLAX ) 17 g packet Take 17 g by mouth daily as needed.     Probiotic Product (PROBIOTIC DAILY PO) Take 1 capsule by mouth daily.     RESTASIS  0.05 % ophthalmic emulsion Place 1 drop into both eyes 2 (two) times daily.     rosuvastatin  (CRESTOR ) 20 MG tablet Take 1 tablet (20 mg total) by mouth daily. (Patient taking differently: Take 20 mg by mouth at bedtime.) 30 tablet 3   sertraline  (ZOLOFT ) 50 MG tablet Take 50 mg by mouth at bedtime.     hydrOXYzine  (ATARAX ) 25 MG tablet Take 1 tablet (25 mg total) by mouth 3 (three) times daily as needed for anxiety. (Patient not taking: Reported on 09/07/2024)     senna-docusate (SENOKOT-S) 8.6-50 MG tablet Take 1 tablet by mouth 2 (two) times daily. (Patient not taking: Reported on 09/07/2024)     thiamine  (VITAMIN B-1) 100 MG tablet Take 1 tablet (100 mg total) by mouth  daily. (Patient not taking: Reported on 09/07/2024)     No current facility-administered medications on file prior to visit.    Allergies:   Allergies  Allergen Reactions   Bee Venom Anaphylaxis and Swelling   Blue Crab (Cavinectes Sapidus) Hives, Swelling, Other (See Comments) and Cough   Shellfish Protein-Containing Drug Products Hives     OBJECTIVE:  Physical Exam  Vitals:   09/07/24 1437  BP: 139/85  Pulse: 76  Temp: 98 F (36.7 C)  SpO2: 92%  Height: 5' 6 (1.676 m)     Body mass index is 21.78 kg/m. No results found.      No data to display           General: well developed, well nourished, seated, in no evident distress Head: head normocephalic and atraumatic.   Neck: supple with no carotid or supraclavicular bruits Cardiovascular: regular rate and rhythm, occasional PVC, systolic murmur noted  Musculoskeletal: no deformity Skin:  no rash/petichiae Vascular:  Normal pulses all extremities   Neurologic Exam Mental Status: Awake and fully alert.  Fluent speech and language.  Oriented to place and time. Recent and remote memory intact. Attention span, concentration and fund of knowledge appropriate. Mood and affect appropriate.  Diminished recall 1/3.  Able to name only 4 animals which can walk on 4 legs. Cranial Nerves: Fundoscopic  exam reveals sharp disc margins. Pupils equal, briskly reactive to light. Extraocular movements full without nystagmus. Visual fields full to confrontation. Hearing intact. Facial sensation intact. Face, tongue, palate moves normally and symmetrically.  Motor: Normal bulk and tone. Normal strength in all tested extremity muscles with exception of very slight weakness notes in right upper and lower ext (4+/5) Sensory.: intact to touch , pinprick , position and vibratory sensation.  Coordination: Rapid alternating movements reduced in all extremities. Finger-to-nose and heel-to-shin reduced bilaterally. Gait and Station: Patient  presents in wheelchair, today.  Gait deferred Reflexes: 1+ and symmetric        ASSESSMENT: ISSAI WERLING is a 89 y.o. year old male history of left cerebellar stroke in February 2025 and left subcortical infarct in May 2023.  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  Vascular risk factors include HTN, HLD, advanced age, previous CVAs.    PLAN: I had a long d/w patient and his daughter about his remote strokes and mild cognitive impairment, risk for recurrent stroke/TIAs, personally independently reviewed imaging studies and stroke evaluation results and answered questions.Continue Plavix  75 mg daily for secondary stroke prevention and maintain strict control of hypertension with blood pressure goal below 130/90, diabetes with hemoglobin A1c goal below 6.5% and lipids with LDL cholesterol goal below 70 mg/dL. I also advised the patient to eat a healthy diet with plenty of whole grains, cereals, fruits and vegetables, exercise regularly and maintain ideal body weight .I also advised him to increase participation in cognitively challenging activities like following up with puzzles, playing bridge, word searches and sudoku.  We also discussed memory compensation strategies.  Followup in the future with Amy, nurse practitioner or call earlier if necessary.   I personally spent a total of 35 minutes in the care of the patient today including getting/reviewing separately obtained history, performing a medically appropriate exam/evaluation, counseling and educating, placing orders, referring and communicating with other health care professionals, documenting clinical information in the EHR, independently interpreting results, and coordinating care.        Clarence Popp, MD  [facility] [address] [place], [place] [address]  Phone [phone] Fax [phone] Note: This document was prepared with digital dictation and possible smart phrase  technology. Any transcriptional errors that result from this process are unintentional.

## 2024-09-08 ENCOUNTER — Ambulatory Visit: Payer: Self-pay | Admitting: Neurology

## 2024-09-08 LAB — LIPID PANEL
Chol/HDL Ratio: 2.8 ratio (ref 0.0–5.0)
Cholesterol, Total: 141 mg/dL (ref 100–199)
HDL: 50 mg/dL
LDL Chol Calc (NIH): 64 mg/dL (ref 0–99)
Triglycerides: 156 mg/dL — ABNORMAL HIGH (ref 0–149)
VLDL Cholesterol Cal: 27 mg/dL (ref 5–40)

## 2024-09-08 LAB — HEMOGLOBIN A1C
Est. average glucose Bld gHb Est-mCnc: 114 mg/dL
Hgb A1c MFr Bld: 5.6 % (ref 4.8–5.6)

## 2024-09-08 NOTE — Progress Notes (Signed)
 Kindly inform the patient that cholesterol profile and screening test for diabetes were both satisfactory.  Continue present medication

## 2025-09-07 ENCOUNTER — Ambulatory Visit: Admitting: Neurology
